# Patient Record
Sex: Female | Born: 1950 | Race: White | Hispanic: No | Marital: Married | Smoking: Never smoker
Health system: Southern US, Community
[De-identification: ages and names within clinical notes are randomized; demographics above are authoritative.]

## PROBLEM LIST (undated history)

## (undated) DIAGNOSIS — I2510 Atherosclerotic heart disease of native coronary artery without angina pectoris: Secondary | ICD-10-CM

## (undated) DIAGNOSIS — C801 Malignant (primary) neoplasm, unspecified: Secondary | ICD-10-CM

## (undated) DIAGNOSIS — I639 Cerebral infarction, unspecified: Secondary | ICD-10-CM

## (undated) HISTORY — PX: THROAT SURGERY: SHX803

## (undated) HISTORY — PX: APPENDECTOMY: SHX54

## (undated) HISTORY — PX: OTHER SURGICAL HISTORY: SHX169

## (undated) LAB — GLUCOSE POC NURSING: POC Glucose: 109

## (undated) MED FILL — ALECTINIB 150 MG CAPSULE: 150 150 mg | ORAL | 30 days supply | Qty: 180 | Fill #2

---

## 2011-12-03 NOTE — Unmapped (Signed)
 Formatting of this note might be different from the original.  Patient aware that she may follow colonoscopy instruction as per doctor's orders according to telephone instruction sheet.   Electronically signed by Lacretia Arrant at 12/03/2011  1:23 PM EDT

## 2011-12-03 NOTE — Unmapped (Signed)
 Formatting of this note might be different from the original.  Patient states that she is to have an EGD and colonoscopy; Dr. Lynda office was called for clarification and patient is to have EGD and colonoscopy; orders to follow per fax from Dr's office.   Patient informed this Clinical research associate that she has been on clear liquids all day and that was given prep instructions for colonoscopy from Dr. Lynda office which she is following.   Electronically signed by Lacretia Arrant at 12/03/2011  1:22 PM EDT

## 2011-12-03 NOTE — Unmapped (Signed)
 Formatting of this note might be different from the original.  Pre-surgical services pre-operative telephone instructions including; that someone needs to stay with patient for 24 hours after procedure, leave all jewelry (including wedding ring) valuables at home, remove ear/body piercing's  and she verbalizes understanding. Patient aware not to take her am medication the am prior to her procedure 12/04/11.    Spoke with Tillman (Endoscopy Production designer, theatre/television/film) and informed her that patient states that she has a mild allergy to latex: that after wearing her CPAP mask for 2 weeks she developed a rash on her face. Was informed by Tillman that Dr. Lynda cases are done latex free and no need for 1st patient in the room due.  Electronically signed by Lacretia Arrant at 12/03/2011 12:37 PM EDT

## 2011-12-03 NOTE — Unmapped (Signed)
 Formatting of this note might be different from the original.  Received orders from Dr. Lynda office for EGD and colonoscopy for 12/04/11.    Electronically signed by Lacretia Arrant at 12/03/2011  1:50 PM EDT

## 2011-12-04 NOTE — H&P (Signed)
 Formatting of this note is different from the original.  History & Physical Note    Patient: Joanna Lopez MRN: [id number]     Date of Birth: May 03, 1951  Age: 61 year old  Sex: female    Unit: [facility] ENDOSCOPY Room/Bed: BTENDO/PB Location: [place]     Admitting Physician: SHARMON CHARLESTON B.     Primary Care Physician: Patsie Later    Date of Surgery: 12/04/2011  Chief Complaint: Diarrhea [787.91]  Hemorrhage of rectum and anus [569.3]  Surgeon: SHARMON CHARLESTON NOVAK., MD   Allergy:   Allergies   Allergen Reactions   ? Latex Rash     States she has a sensitivity to latex after being around it for days, she gets a rash: she states that when she wore her cpap machine for 14 days she developed a rash on her face other than this she has no problems stating her dentist wears latex gloves and she has no problems with latex gloves.    ? Formaldehyde      in nail polish and rash and swelling   ? Penicillins      rash and ?wheezing as a child   ? Sulfa Antibiotics      rash     Subjective:     Joanna Lopez is a 60 year old female with Diarrhea [787.91]  Hemorrhage of rectum and anus [569.3] who presents for Procedure(s):    Past Medical History:  Positives:  Past Medical History   Diagnosis Date   ? Unspecified essential hypertension    ? Depressive disorder, not elsewhere classified    ? Hyperlipemia    ? Other malignant neoplasm without specification of site (HCC)      basal skin cancer     Negatives:  No past medical history pertinent negatives.    Past Surgical History:  Positives:  Past Surgical History   Procedure Laterality Date   ? Appendectomy  age 6   ? Mastoidectomy,simple  1979     left side-thought ca but turned out blocked duct   ? Colonoscopy  2004     nl/screening   ? Adenoidectomy     ? Uvulopalatopharyngoplasty  2006   ? Tonsillectomy       Negatives:   No past surgical history pertinent negatives on file.    History of adverse reaction to anesthesia: No    Family History    Problem Relation Age of Onset   ? Adopted: Yes     History   Substance Use Topics   ? Smoking status: Never Smoker    ? Smokeless tobacco: Never Used   ? Alcohol Use: Yes      a little wine     Prior to Admission medications    Medication Sig Start Date End Date Taking? Authorizing Provider   lisinopril  (PRINIVIL ,ZESTRIL ) 10 MG TABS Take 10 mg by mouth daily.   Yes Med, Historical   simvastatin (ZOCOR) 20 MG TABS Take 20 mg by mouth at bedtime.   Yes Med, Historical   CELEXA  20 MG OR TABS TAKE ONE TABLET DAILY  08/02/04  Yes Rosanna Charlie NOVAK., MD   PREMPRO 0.3-1.5 MG OR TABS 1 TABLET DAILY 10/19/03  Yes Rosanna Charlie NOVAK., MD   ACETASOL-HC 2-1 % OT SOLN AS NEEDED 09/24/03  Yes Rosanna Charlie NOVAK., MD   NASONEX 50 MCG/ACT NA SUSP TWO PUFFS EACH SIDE ONCE PER DAY 04/20/03  Yes Rosanna Charlie NOVAK., MD   CALTRATE 600 +  D TABS 600-125 MG-IU OR 2 TABLETS DAILY 01/11/02        Review of Systems    Immunizations:  Immunization History   Administered Date(s) Administered   ? dT, adult 01/11/2002     Objective:     BP 130/82  Pulse 73  Temp(Src) 97.6 F (36.4 C) (Temporal)  Resp 14  Ht 66.5 (168.9 cm)  Wt 215 lb (97.523 kg)  BMI 34.19 kg/m2  SpO2 98%  Weight: Weight: 215 lb (97.523 kg)  Height: Height: 66.5 (168.9 cm)  O2 Saturation: SpO2: 98 %    Physical Exam   Constitutional: She appears well-developed and well-nourished.   HENT:   Head: Normocephalic and atraumatic.   Eyes: Pupils are equal, round, and reactive to light.   Neck: Normal range of motion. Neck supple.   Cardiovascular: Normal rate and regular rhythm.    Pulmonary/Chest: Effort normal and breath sounds normal.   Abdominal: Soft. Bowel sounds are normal.   Musculoskeletal: Normal range of motion.   Neurological: She is alert.   Skin: Skin is warm.     Assessment/Plan:         Principal Problem:    Rectal bleeding  Active Problems:    Diarrhea    Procedure:  Procedure(s):  ESOPHAGOGASTRODUODENOSCOPY WITH POSSIBLE BIOPSY AND OR DILATION AND  COLONOSCOPY WITH POSSIBLE BIOPSY AND OR POLYPECTOMY     Additional recommendations: Per Surgeon    Signed By: Lamar FURY Cucinotta, MD      Electronically signed by Sharmon Lamar NOVAK., MD at 12/04/2011  8:37 AM EDT

## 2011-12-04 NOTE — OR Nursing (Signed)
 Formatting of this note might be different from the original.  No latex utilized in endo procedure room  Electronically signed by Delphina Lolita RAMAN, Registered Nurse at 12/04/2011  9:14 AM EDT

## 2011-12-04 NOTE — Nursing Note (Signed)
 Formatting of this note might be different from the original.  Pt c/o gas pain. Pt grimacing, moaning. Pt repositioned, pt passing flatus, abd soft, BS present. Pt offered to ambulated to restroom pt pt states she feels she is too woosy to get out of bed. Monitoring.  Electronically signed by Layman Leita PARAS, Registered Nurse at 12/04/2011  9:44 AM EDT

## 2011-12-04 NOTE — OR Nursing (Signed)
 Formatting of this note might be different from the original.  Post Endoscopy Patient Assessment    Dentition  Bite Block removed by Particia [place], CRNA  Dental condition unchanged from pre-procedure:  correct    Dilitation:    Belongings    Glasses to recovery:  yes    General Post Colon    Abdomen Soft:  yes      Electronically signed by Delphina Lolita RAMAN, Registered Nurse at 12/04/2011  9:35 AM EDT

## 2011-12-04 NOTE — Nursing Note (Signed)
 Formatting of this note might be different from the original.  Pt passed large amts flatus, pt reports feeling good. Returned to [employer], husband at bedside. Pt currently eating and drinking without c/o pain or n/v. Swallowing without difficulty. Dr. Cucinotta in to see pt.   Electronically signed by Layman Leita PARAS, Registered Nurse at 12/04/2011 10:26 AM EDT

## 2011-12-04 NOTE — Nursing Note (Signed)
 Formatting of this note might be different from the original.  Discharge teaching provided to pt and husband, understanding voiced of all instructions. Pt provided with Diverticular Disease and High Fiber Diet pamphlets. No further changes in assessment. Pt getting ready for discharge.  Electronically signed by Layman Leita PARAS, Registered Nurse at 12/04/2011 10:32 AM EDT

## 2011-12-04 NOTE — Nursing Note (Signed)
 Formatting of this note might be different from the original.  Assisted to bathroom per wheelchair. Pt currently remains in bathroom, passing flatus but still c/o bad gas pain. Monitoring.  Electronically signed by Layman Leita PARAS, Registered Nurse at 12/04/2011  9:59 AM EDT

## 2012-07-21 ENCOUNTER — Ambulatory Visit: Admit: 2012-07-21 | Discharge: 2012-07-21 | Payer: PRIVATE HEALTH INSURANCE | Attending: Critical Care Medicine

## 2012-07-21 DIAGNOSIS — G4710 Hypersomnia, unspecified: Secondary | ICD-10-CM

## 2012-07-21 MED ORDER — tiotropium (SPIRIVA WITH HANDIHALER) 18 mcg
18 | ORAL_CAPSULE | Freq: Every day | RESPIRATORY_TRACT | 3.00 refills | 30.00000 days | Status: AC
Start: 2012-07-21 — End: 2012-10-18

## 2012-07-21 NOTE — Unmapped (Signed)
History of Present Illness:      Joanna Lopez is a 62 y.o. female.    HPI  62 yo white female lifelong nonsmoker present for abnormal CT chest. She presented to pcp for cough which began with URI. Saw pcp and had CXR to exclude pneumonia. RML nodule seen on CXR and CT chest chest performed which confirmed nodule and borderline adenopathy. Cough is now nearly gone. Now 2 on 1-10 scale, 10 worst. Pt notes hypersomnolence and a hx of OSA s/p UPPP and taken off CPAP 3 years. Weight is stable. She has hot flashes since June 1999, no recent change. No chest pain until severe cough. No passive smoke exposure. She is adopted but none of her children have asthma. Never treated with inhalers.       The following portions of the patient's history were reviewed and updated as appropriate: allergies, current medications, past family history, past medical history, past social history, past surgical history and problem list. Widowed/ remarriwed, lives in house with husband and cat, works as Diplomatic Services operational officer at [employer]- no significant occupational exposures.    Review of Systems   Constitutional: Positive for fatigue. Negative for fever, chills, diaphoresis, activity change, appetite change and unexpected weight change.   HENT: Positive for congestion and neck pain. Negative for hearing loss, ear pain, nosebleeds, sore throat, facial swelling, rhinorrhea, sneezing, drooling, mouth sores, trouble swallowing, neck stiffness, dental problem, voice change, postnasal drip, sinus pressure, tinnitus and ear discharge.    Eyes: Negative for photophobia, pain, discharge, redness, itching and visual disturbance.   Respiratory: Positive for apnea and cough. Negative for choking, chest tightness, shortness of breath, wheezing and stridor.    Cardiovascular: Negative for chest pain, palpitations and leg swelling.   Gastrointestinal: Positive for diarrhea. Negative for nausea, vomiting, abdominal pain, constipation, blood in  stool, abdominal distention, anal bleeding and rectal pain.   Genitourinary: Positive for urgency and flank pain. Negative for dysuria, frequency, hematuria, decreased urine volume, vaginal bleeding, vaginal discharge, enuresis, difficulty urinating, genital sores, vaginal pain, menstrual problem, pelvic pain and dyspareunia.   Musculoskeletal: Negative for myalgias, back pain, joint swelling, arthralgias and gait problem.   Skin: Negative for color change, pallor, rash and wound.   Neurological: Positive for headaches. Negative for dizziness, tremors, seizures, syncope, facial asymmetry, speech difficulty, weakness, light-headedness and numbness.   Hematological: Negative for adenopathy. Does not bruise/bleed easily.   Psychiatric/Behavioral: Positive for sleep disturbance. Negative for suicidal ideas, hallucinations, behavioral problems, confusion, self-injury, dysphoric mood, decreased concentration and agitation. The patient is not nervous/anxious and is not hyperactive.    All other systems reviewed and are negative.        Objective:   Blood pressure 124/84, pulse 70, height 5' 6 (1.676 m), weight 212 lb 9.6 oz (96.435 kg), SpO2 97.00%.    Physical Exam   Nursing note and vitals reviewed.  Constitutional: She is oriented to person, place, and time. She appears well-developed and well-nourished. No distress.   HENT:   Head: Normocephalic and atraumatic.   Right Ear: External ear normal.   Left Ear: External ear normal.   Nose: Nose normal.   Mouth/Throat: Oropharynx is clear and moist. No oropharyngeal exudate.   TMs with good cone of light bilaterally, s/p UPPP   Eyes: Conjunctivae and EOM are normal. Right eye exhibits no discharge. Left eye exhibits no discharge. No scleral icterus.   Neck: Neck supple. No JVD present. No tracheal deviation present. No thyromegaly present.   Cardiovascular:  Normal rate, regular rhythm, normal heart sounds and intact distal pulses.  Exam reveals no gallop and no friction  rub.    No murmur heard.  Pulmonary/Chest: Effort normal and breath sounds normal. No stridor. No respiratory distress. She has no wheezes. She has no rales. She exhibits no tenderness.   Abdominal: Soft. She exhibits no distension and no mass. There is no tenderness. There is no rebound and no guarding.   Musculoskeletal: She exhibits no edema and no tenderness.   No clubbing   Lymphadenopathy:     She has no cervical adenopathy.   Neurological: She is alert and oriented to person, place, and time. No cranial nerve deficit. Coordination normal.   Skin: Skin is warm and dry. No rash noted. She is not diaphoretic. No erythema. No pallor.   Psychiatric: She has a normal mood and affect. Her behavior is normal. Judgment and thought content normal.       Lab Review:   CT chest 07/12/12:  Right middle lobe nodule. Neoplasm is not excluded and PET/CT correlation is recommended.  Borderline prominent right hilar lymph nodes are noted with 1.2 cm AP window nodes also noted.  Indeterminate hepatic lesions, likely atypical cysts.  Electronically Signed by: Fransico Him, MD     Result Narrative   CT OF THE CHEST WITH CONTRAST  History: 796.9  FINDINGS:  The lungs appear clear of acute parenchymal consolidation or effusion.  There is a nodular density seen in the right middle lobe which measures 1.7 cm. This is suspicious for possible neoplasm and correlation with PET/CT or followup imaging may be performed.  The port line prominent right hilar lymph nodes noted.  No gross mediastinal adenopathy is seen. However, a borderline prominent AP window node is noted which measures up to 1.2 cm.  Limited views of the upper abdomen show multiple hepatic lesions. These are indeterminate and likely represent cysts. However, atypical metastases are difficult to exclude entirely.         Prior Diagnostic Testing:         Assessment:     1. RML nodule/borderline  AP window LN- will schedule PET/CT to assess activity of disease-- if negative  will get follow up CT chest in 6 months. If positive, will need tissue diagnosis. No previous hx of CA except basal cell CA. She does not know her family hx as she is adopted.    2.GERD- increased with URI and may be cause of persistent cough. Now nearly gone.    3. Hypersomnolence/OSA/UPPP- will refer to sleep medicine for evaluation    4. Obesity- discussed weight loss/diet.          Plan:     Call for CT/PET results to plan next step.             Medical Decision Making:  The following items were considered in medical decision making:  Review / order clinical lab tests  Review / order radiology tests  Review / order other diagnostic tests/interventions  Reviewed outside records  Independent review of radiologic images

## 2012-07-21 NOTE — Unmapped (Signed)
Call for results of the PET/CT.

## 2012-07-27 ENCOUNTER — Inpatient Hospital Stay: Admit: 2012-07-27 | Payer: PRIVATE HEALTH INSURANCE | Attending: Critical Care Medicine

## 2012-07-27 DIAGNOSIS — R911 Solitary pulmonary nodule: Secondary | ICD-10-CM

## 2012-07-27 LAB — POC GLU MONITORING DEVICE: POC Glucose Monitoring Device: 85 mg/dL (ref 65–99)

## 2012-07-27 MED ORDER — F-18 FDG 19.8 milli Curie
Freq: Once | Status: AC | PRN
Start: 2012-07-27 — End: 2012-07-27

## 2012-07-27 MED ORDER — F-18 FDG 18.8 milli Curie
Freq: Once | Status: AC | PRN
Start: 2012-07-27 — End: 2012-07-27
  Administered 2012-07-27: 14:00:00 via INTRAVENOUS

## 2012-07-27 MED FILL — F-18 FDG INJECTION: 18.80 18.80 millicurie | Qty: 1

## 2012-07-27 MED FILL — F-18 FDG INJECTION: 19.80 19.80 millicurie | Qty: 1

## 2012-08-01 NOTE — Unmapped (Signed)
No answer.  Left message that I called.

## 2012-08-01 NOTE — Unmapped (Signed)
Results in EPIC

## 2012-08-01 NOTE — Unmapped (Signed)
patient and spouse called for results of PET CT done on 07/27/2012 at [facility].  patient and spouse can be reached at [phone] (home) [phone] (work)  It is not okay to leave detailed message at this phone number. Please call husband at [phone] ok per wife   Last visit:   Next Visit:

## 2012-08-02 NOTE — Unmapped (Signed)
Called pt/ Will schedule abdominal ultrasound to further characterize liver and left kidney lesion. Will schedule follow up CT chest in 6 months with follow up with me thereafter.

## 2012-08-02 NOTE — Unmapped (Signed)
Patient returning call, can contact her at work with the results [phone].

## 2012-08-02 NOTE — Unmapped (Signed)
Pt has been called and scheduled on 3/28 for abd ct / Sept 23 - chest ct amd Dr. Eleanora Neighbor on 9/25 [facility]

## 2012-08-02 NOTE — Unmapped (Signed)
I called pt.

## 2012-08-05 ENCOUNTER — Inpatient Hospital Stay: Admit: 2012-08-05 | Payer: PRIVATE HEALTH INSURANCE | Attending: Critical Care Medicine

## 2012-08-05 DIAGNOSIS — K769 Liver disease, unspecified: Secondary | ICD-10-CM

## 2012-08-09 ENCOUNTER — Inpatient Hospital Stay: Payer: PRIVATE HEALTH INSURANCE | Attending: Critical Care Medicine

## 2012-08-11 NOTE — Unmapped (Signed)
Will arrange for her to have GI consult for frequent stools and Urology consult for bilateral hydronephrosis at [facility]. She knows you will call. Thanks

## 2012-08-11 NOTE — Unmapped (Signed)
Patient called requesting to speak with MD about her results for her US Abdomen Complete. She has a few more questions. Please contact patient.

## 2012-08-12 NOTE — Unmapped (Signed)
No answer at 10:07am

## 2012-08-15 NOTE — Unmapped (Signed)
Spoke with patient. Numbers given to schedule appt.

## 2012-08-19 ENCOUNTER — Inpatient Hospital Stay: Admit: 2012-08-19

## 2012-08-29 ENCOUNTER — Ambulatory Visit: Admit: 2012-08-29 | Payer: PRIVATE HEALTH INSURANCE

## 2012-08-29 DIAGNOSIS — G4733 Obstructive sleep apnea (adult) (pediatric): Secondary | ICD-10-CM

## 2012-08-29 NOTE — Unmapped (Signed)
~~~~~~~~~~~~~~~~~~~~~~~~~~~~~~~~~~~~~~~~~~~~~~~~~~~~~~~~~~~    CHIEF COMPLAINT: previously diagnosed with OSA was given PAP Therapy, used it for a while then had UPPP. Never was restested after UPPP. Now complains of hyeprsomnia, fatigue, frequent nighttime awakenings, leg movts at night, legs restless, memory problems, dry mouth, nasal congestion, waking up unrefreshed, gasp arousals, nocturia, night sweats, witnessed apnea, vivid dreams, muscle jerk in sleep    REFERRING PHYSICIAN: Dr Doylene Canard (ENT); Dr Eleanora Neighbor (pulm)    PCP & Other consulting physicians:     HISTORY  HPI:  62 y.o. with history of hyperlipidemia, hypertension, anxiety/depression, GERD presents with previously diagnosed with OSA was given PAP Therapy, used it for a while then had UPPP. Never was restested after UPPP. Now complains of hyeprsomnia, fatigue, frequent nighttime awakenings, leg movts at night, legs restless, memory problems, dry mouth, nasal congestion, waking up unrefreshed, gasp arousals, nocturia, night sweats, witnessed apnea, vivid dreams, muscle jerk in sleep referred for evaluation of symptoms suggesting a sleep related breathing disorder.     Patient was never retested after herUPPP but notes her snoring never went away after surgery.     She also endorses URGE symptoms but also describes radiating pain from low back down to her feet. Informed could have some radiculopathy that needs to be evaluated by PCP.     EPWORTH SLEEPINESS SCALE: 17 (at or above 10 is abnormal and 24 is the maximum score),   International Restless Legs Syndrome Rating Scale: 28 (Mild 0-10; Moderate 11-20; Severe 21-30; Very severe 31-40)       Sleep History and Schedule  Bedtime is 8-9 on weekdays and 10 on weekends. Falls asleep within 3 minutes, with 3 nighttime awakenings. Falls back asleep in 3 minutes. Sleeps on own bed. Awakens at 6 during the week and 10-11 on the weekends.   Husband bed partner. Patient thinks they get approximately 9-11 hours of sleep  a night.      Patient's desired sleep schedule would be to fall asleep at 9 and wake up at 10    Denies cataplexy, sleep paralysis, hypnagogic or hypnopompic hallucinations, dream enactment, and somnambulism.    Denies: bruxism, enuresis, sleepwalking, sleep eating, sleep terrors,   Endorse; all URGE symptoms such as (irresistible urge to move legs, sensation being worse at late evening or when resting, movement improving symptoms) or muscle jerks in sleep.     Sleep Habits   Does NOT drink caffeine 6 hours before bedtime   Does NOT exercise before bedtime   Does NOT reads before bedtime on a e-reader   Watches TV before falling asleep, TV is in the bedroom  Does NOT work on electronic devices before falling asleep or during the night  Patient does takes naps 3-4 days a week for 3-5 hours  Does not use sleeping aid.  Driving while drowsy: No veering to the side of the road,accidents, or near-accidents due to sleepiness while driving.     Social Hx:   Marital Status: married with 3 children  Occupation: Air cabin crew  Tobacco- never  Caffeine - yes 1 cup by 4pm   Alcohol - 1 glass ofwine weekly  Recreational Drugs - no  Exercise - no    Current Medication List:   Outpatient Prescriptions Marked as Taking for the 08/29/12 encounter (Office Visit) with Ozzie Hoyle, PA   Medication Sig Dispense Refill   ??? calcium carbonate (OS-CAL) 600 mg (1,500 mg) Tab Take 600 mg by mouth 2 times a day with meals.       ???  cholecalciferol, vitamin D3, 1000 units tablet Take 1,000 Units by mouth daily.       ??? citalopram (CELEXA) 20 MG tablet Take 20 mg by mouth daily.       ??? dicyclomine (BENTYL) 10 MG capsule Take 10 mg by mouth 4 times a day with meals and at bedtime.       ??? esomeprazole (NEXIUM) 40 MG capsule Take 40 mg by mouth every morning before breakfast.       ??? lactobacillus rhamnosus, GG, (CULTURELLE) 10 billion cell capsule Take 2 capsules by mouth daily.       ??? lisinopril (PRINIVIL,ZESTRIL) 10 MG  tablet Take 10 mg by mouth daily.       ??? simvastatin (ZOCOR) 40 MG tablet Take 40 mg by mouth at bedtime.         No Facility-Administered Medications for the 08/29/12 encounter (Office Visit) with Ozzie Hoyle, PA.       ROS: Endorses: hx of witnessed apneas, daytime sleepiness, (somnloence), fatigue, tiredness, morning headaches, dizziness, palpitations,  dry mouth, nasal congestion,  gasp arousals, waking up unrefreshed,  nocturia,     Denies: suicidal ideas, irritability, depression, racing mind, changes in appetite, fainting spells, alcoholism, tremors, using illegal drugs, worrying about inability to sleep, insomnia, chest pain and shortness of breath, sexual problems, memory problems, concentration difficulties,sleeps in a chair with chest raised, sleeps with a dental guard, drowsy driving,lower extremity edema,    Family Hx:   Family History   Problem Relation Age of Onset   ??? Adopted: Yes   ??? Diabetes Son        Past Medical Hx:   Past Medical History   Diagnosis Date   ??? GERD (gastroesophageal reflux disease)    ??? Hypertension    ??? Hyperlipidemia    ??? IBS (irritable bowel syndrome)    ??? Basal cell cancer    ??? OSA (obstructive sleep apnea)        Problem List:  Patient Active Problem List   Diagnosis   ??? GERD (gastroesophageal reflux disease)   ??? Lung nodule, solitary       SurgHx:   Past Surgical History   Procedure Laterality Date   ??? Appendectomy     ??? Uvp     ??? Submastoid gland removal     ??? Uvulopalatopharyngoplasty     ??? Tonsillectomy         See scanned Sleep History/Life History Questionnaire for ROS and additional information. This has been reviewed and updated with the patient during this visit.     OBJECTIVE:     Physical:    Vital signs and nursing intake notes reviewed.   BP 128/82   Pulse 97   Resp 18   Ht 5' 6.5 (1.689 m)   Wt 211 lb 9.6 oz (95.981 kg)   BMI 33.65 kg/m2   SpO2 97%   Constitutional: well groomed, no respiratory distress, not using expiratory accessory muscles, speaking  in full sentences, Weight: obese class I   Skin: normal color  Eyes: pupils equal.   Ears/Nose/Throat: external nose normal, no deviated septum, no thrush, normal dentition, modified Mallampati Class 4, uvula surgically absent, macroglossia, tongue ridging.   Neck: no thyroid masses or nodules, no thyromegaly   Chest: no deformities, no chest wall tenderness, normal chest expansion/symmetry, normal resonance, no wheezing, no crackles, no rhonchi.   Cardiovascular: RRR, clear s1/s2, m/r/g, no peripheral edema,   Extremities: no clubbing, no cyanosis, normal gait, no  peripheral edema.   Neurological: cranial nerves grossly intact, appropriate mental status.   Psychological: oriented to time; place; and person, affect and mood appropriate, normal interaction.     Labs Reviewed:   No results found for this basename: IRON, TIBC, FERRITIN     No results found for this basename: WBC, HGB, HCT, MCV, PLT     No results found for this basename: GLUCOSE, BUN, CO2, CREATININE, K, NA, CL, CALCIUM     No results found for this basename: HGBA1C     No results found for this basename: TSH, T3TOTAL, T4TOTAL, T3FREE, FREET4, THYROIDAB       Prior Diagnostic Testing: No prior sleep study available for reveiw    ASSESSMENT/PLAN:     1) Obstructive sleep apnea: 63 y.o. obese patient with history of  hyperlipidemia, hypertension, anxiety/depression, GERD presents with previously diagnosed with OSA was given PAP Therapy, used it for a while then had UPPP. Never was restested after UPPP. Now complains of hyeprsomnia, fatigue, frequent nighttime awakenings, leg movts at night, legs restless, memory problems, dry mouth, nasal congestion, waking up unrefreshed, gasp arousals, nocturia, night sweats, witnessed apnea, vivid dreams, muscle jerk in sleep. Given the high clinical suspicion of sleep disordered breathing, a nocturnal polysomnography is indicated. The patient was instructed in the procedure.      The patient was educated re: OSAHS  pathophysiology, relationship with cardiovascular, cerebrovascular and metabolic morbidity and mortality, issues related treatment options. Various treatment modalities such as positive airway pressure therapy (PAP Therapy), mandibular repositioning device (MRA) . Patient verbalized understanding.    Patient was advised that if NPSG study indicates a diagnosis of obstructive sleep apnea to return to clinic for PAP titration study. If PAP therapy is pursued, it is recommended patient return for a CPAP Edu clinic visit 2-4 wks after obtaining equipment. Return for provider office visit in 10-12 wks. Patient verbalized understanding and agrees to comply.     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 Reassess after sleep study(ies). Reviewed sleep hygiene. Patient was educated regarding avoiding driving while sleepy, specifically avoiding long distance or night driving pending successful treatment of this condition.     3) Restless leg syndrome: Patient endorses all URGE symptoms such as irresistible urge to move legs, sensation being worse at late evening or when resting, movement improving symptoms. Occurs 5-7 nights per week. Patient denies a history of IDA. These symptoms have no association with onset on starting new medications.  Patient was adopted. Endorses leg jerks while sleeping . Laboratory workup for causes of secondary RLS will be performed such as Iron, TIBC and ferritin. Evaluation for the possibly associated PLMS will be accomplished at the time of the upcoming polysomnographic testing.     Diet, exercise and proper sleep hygiene were emphasized. Sleep hygiene instructions given.

## 2012-08-29 NOTE — Unmapped (Signed)
Instructions for your Sleep Study           LOCATION AND DIRECTIONS    Your study will be conducted at our [facility] on [address] [place], [place] [address] (Please note this is a different location from where you had your clinic appointment).     Driving Directions: Once you turn onto [address] go straight past the first stop sign, at the second stop sign, turn right. Once you turn, you will see a ???[facility]??? sign. You can park in the lot in front of it. The door under the awning is the Sleep [facility] Center. Go through the sliding doors, there is doorbell on the wall, please press the button and a technician will let you in.     Overnight studies begin at 8pm (please do not arrive earlier). The wake up time for the study is between 5-6 am. If you need to wake up sooner, please notify the technician before going to sleep.       If someone will be picking you up in the morning following your study, you must inform the sleep technician the night before, so they can allow you time to call for your ride.     WHAT TO BRING    You should bring your toiletries, sleepwear, a robe or sweater (in case you get cold), and a change of clothing for when you leave. Wear your regular clothing when you arrive. Please BRING your night-time clothes with you. Please note leads will be placed on your scalp and chest. Please wear something loose and comfortable.  You will be given time to change.     Medications: Please bring a complete list of all your medications (including vitamins, supplements or medications you take as needed). BRING your nighttime medications with you, they have to be in their original packaging (you will be given an opportunity to take your nighttime medications before you sleep). If you normally use sleep aids or medications for restless legs or periodic limb movement disorder such as Ambien, Lunesta, Melatonin, Mirapex, Requip etc please bring them with you. Notify the  technician of all medications you take that night.     BRING your insurance card and picture ID.     Optional: Bring your own pillow and/or blanket, snacks and sweater.     If you normally wear a dental guard at night please bring it with you for the study. Ideally we would like to study you without the guard in place to establish what your normal sleep pattern is.      If you have a PAP device please bring your equipment and supplies to the study. If any pressure changes are needed a preliminary adjustment may be made.    WHAT TO DO BEFORE YOUR SLEEP TEST (Please follow the same instructions for a home sleep test)      Shower before you come for the test (this way there are no oils on the skin for the study). DO NOT apply lotion, creams, make-up, nail polish or put hair products (such as hair spray or gel). Your hair should be dry when you arrive. Each room has its own bathroom and shower. You will be able to shower the following morning if you wish. Be sure to bring your own toiletries as these are NOT provided.     Female Patients: Unless you have a beard, please be ???clean shaven???    DO NOT take any naps on the   day of the study.     DO NOT have any chocolate or caffeine after 2 pm.     Please inform the technician if you have any allergies or sensitivities, before the technician starts to apply the leads (stickers).      If you need to cancel or re-schedule, you must give [place] at least 24 HOURS notice or you may be charged a cancellation fee. Please call our office at [phone], option #1 or our sleep lab at [phone] (leave a voicemail) to inform us of changes.     If you have a terrible cold or received some bad news on the day of the study, please contact our office to reschedule. Please follow all protocols regarding cancellation and no show policies. If you have any questions or concerns please call our office for further details, call [phone] Option #1.      AFTER YOUR SLEEP STUDY  ?? After your first  study (a diagnostic study), you will receive a call within 7-10 business days with your results.   ?? If your study indicates that you have obstructive sleep apnea (OSA) you will be advised to set up a second sleep study. The purpose of the second study is to determine the setting for your PAP equipment (positive airway pressure device). On this night you will be shown a variety of masks. You will choose the one most comfortable to you. The technician will start at the lowest pressure and keep increasing it by one level until an optimal pressure is reached (which is when your oxygen level is normal and you are not snoring anymore).   ?? Within 7-10 business days a final prescription will be sent to a DME (medical supply company) covered by your insurance. The DME company should contact you within 7-14 business days. If you have not received a call from the DME Company regarding your device, PLEASE CALL us so we can further investigate.   ?? If your study indicates you do not have obstructive sleep apnea, please schedule a follow up appointment with your provider to review other possible sleep disorders that may be identified during your sleep test.   ?? If you have a home sleep test, you will receive a call after 5-7 business days with your result. You will be informed as to what the next step will be. You should receive a call from the DME company within 7-14 days after your first study. If you do not hear from them PLEASE CALL us.   ?? Once you are set up with your PAP equipment, please call our office and set up an appointment for our CPAP Education Clinic 2-3 weeks later. We will obtain a download from your machine, address any issues with the mask or device, review cleaning instructions, replacement supplies and answer any questions you have.   ?? Please bring all your equipment to EVERY office visit including your machine, tubing, mask and power cord. Do not travel with water in the water chamber. We do a thorough  check on your device at every visit.   ?? Please note that if you are provided with a mask during your overnight sleep study or by your medical supply company you have 30 days to make any changes or exchanges. After the 30 day period in most cases no changes can be made for up to 3 months or longer depending on your insurance. If you have any questions please contact your medical supply company or follow up with  [place] in CPAP education clinic to learn more.       CPAP EDUCATION CLINIC    Most insurance companies require that you use your positive airway pressure device (CPAP, BPAP, APAP or ASV) a minimum of 4 hours per night in order to continue to pay for it.  Usually, you only have 90 days from the time you get it to show that you are using it regularly.  Because sleep apnea is a serious respiratory disorder that can cause heart diseases, stroke and other complications, the use of positive pressure whenever sleeping is the preferred goal of treatment.     While it is not required, it is strongly advised that you attend the CPAP Technician/Education Clinic for a visit with a sleep technician 2-4 weeks after being set up with your equipment by the home medical equipment company.  You will need a separate, face-to-face appointment with your sleep medicine doctor 31-90 days after getting your positive pressure device due to most insurance company rules, but most importantly, for discussion of your test results, your progress, additional assistance with problems you may be having and to discuss the future treatment and management of your sleep apnea.     The CPAP Technician/Education Clinic is a service we provide to assist our patients with learning to use their CPAP, BPAP, APAP or ASV. It is provided several times a week and it is a daytime appointment. At that visit you can discuss any issues you may be having with your PAP device. We will obtain a download from your device to see how well the machine is working for you  at home, the download will let us know when you put on the device, how long you've had it on, whether there are any leaks, and with some devices, an estimate of the amount of sleep apnea occurring at the prescribed pressure.     This is your tech support if you are having any issues with the mask such as discomfort or leaks, nose or mouth dryness (we can adjust the humidity setting and show you how to do it yourself), difficulty tolerating the pressure, etc.     It is our goal to address any issues you have with the device and mask early on so that you are comfortable with PAP therapy. If you have more questions regarding CPAP Technician Clinic please feel free to contact us for more details.     Once you receive your device and supplies, please make sure to bring all pieces to every office visit such as: device, mask, power cord, tubing, water reservoir, SD card/downloadable chip, etc.     Please note that if you are provided with a mask during your overnight sleep study or by your medical supply company you have 30 days to make any changes or exchanges. After the 30 day period in most cases no changes can be made for up to 3 months or longer depending on your insurance. If you have any questions please contact the medical equipment company or follow up with [place] in CPAP Technician Clinic to learn more.        What is Restless Leg Syndrome?     Restless Leg Syndrome (RLS) is a movement disorder. It involves an intense urge to move your legs at bedtime. RLS symptoms vary from person to person. These symptoms are at times hard to describe. Many people say their legs feel like there is a creepy or crawly sensation in them. This is different  from a muscle cramp or pain or numbness in one legs. The uncomfortable sensations of RLS appear most often in the calves of the legs. They are usually worse when sitting or lying still. One can find temporary relief by stretching or moving the legs. Some people also have symptoms  in the arms and other parts of the body.     Some people with RLS have symptoms only at certain times. Others have them on a regular basis. Nightly symptoms can create a constant need to stretch or move the legs. This may prevent you from falling asleep or staying asleep. As a result, people with RLS often have poor sleep quality. They may be tired during the day. Sleepiness is only one daytime problem that RLS causes, these symptoms also can make it hard to travel by car or plane during the day or stay seated at movies, concerts and business meetings. This is because it hard to sit still for long periods of time. \\    Most people with RLS also have periodic limb movements (PLMs). These movements tend to consist of an extension of the big toe, bending of the ankle, keen or hip and are described as jerks or kicks. PLMs occur most often when you are asleep. You are unaware of them and have no control over them. On rare occasions you may notice the PLMs while you are still awake. In contrast, RLS may cause movements when you are awake. They are voluntary response to uncomfortable feelings in your legs.     What causes RLS?     RLS may be hard to describe, but it is not a psychological or emotional condition. Researchers are unsure of its exact causes. Current studies are focused on a brain chemical called dopamine. Medications that increase dopamine in the brain have been effective at relieving symptoms associated with RLS.     Some people of medical conditions that seem to increase the chance of developing RLS. These conditions include :   Low blood iron levels  Poor blood circulation in the legs  Nerve problems in the spine or legs  Muscle disorders  Kidney disorders  Alcoholism  Certain vitamin or mineral deficiencies     About 50% of people with RLS who don't have one of the medical conditions listed above have a family member with RLS. This strongly suggests a hereditary cause for this disorder in some people.      No matter what the cause, some medications may trigger RLS. These include over-the-counter allergy and cold medications. Caffeine, alcohol, and tobacco use may make the condition worse.   How is RLS diagnosed?     There is currently no blood tst or x-ray that detects RLS. Your doctor will base a diagnosis on a complete medical history and physical exam. Additional tests may help determine if your complaints are related to another medical condition. These test might include blood work or an overnight sleep study. Most of the time RLS symptoms are unique that the diagnosis will be obvious. Having all these symptoms clearly indicates that you have RLS:   ?? You have an intense urge to move your legs  ?? You have unpleasant sensations in your legs that you might describe with these words: creeping, crawling, pulling, tingling, or electric feelings.   ?? Symptoms are worse when resting or inactive, especially when sitting or lying down  ?? Moving your legs relieves the symptoms  ?? Symptoms are worse in the evening or at  bedtime.       How is RLS treated?   The first step in treating RLS is to see if you have any other conditions that are related to the problem. Other conditions that might be related to RLS include the following: iron deficiency anemia, diabetes, arthritis, other medications.     Detecting and treating these conditions may sometimes relieve the symptoms of RLS. For many people the symptoms still persists and other forms of treatment are added on. Home remedies are enough to help some people with mild or occasional RLS. These remedies include:   ?? Hot baths  ?? Leg massage  ?? Applied heat  ?? Ice packs  ?? Aspirin or other pain relievers  ?? Quit smoking  ?? Not drinking alcohol  ?? Regular exercise  ?? The elimination of caffeine     When symptoms are severe or home remedies are ineffective, you can take prescription medications to treat RLS. Some people respond better to some medications versus others. Some  medications used to treat RLS are Sinemet, Mirapex, Requip, Klonopin, Neurontin.     Adapted from The [employer] wellness booklet on RLS.

## 2012-08-29 NOTE — Unmapped (Signed)
Allied  Adriane  Ded 650.00 (met)  20%  OOP 2650.00 (not met)  Precert is not needed  Ref # [id number]

## 2012-09-09 ENCOUNTER — Ambulatory Visit: Admit: 2012-09-09 | Payer: PRIVATE HEALTH INSURANCE

## 2012-09-09 DIAGNOSIS — G4733 Obstructive sleep apnea (adult) (pediatric): Secondary | ICD-10-CM

## 2012-09-10 NOTE — Unmapped (Signed)
NPSG completed.  M. Naksh Radi, RRT, RPSGT, RST

## 2012-09-20 NOTE — Unmapped (Signed)
Study was scored and copied to Dr. Surdulescu.

## 2012-09-22 NOTE — Unmapped (Addendum)
Message copied by Doren Custard on Thu Sep 22, 2012 10:13 AM  ------       Message from: Marcelle Overlie       Created: Wed Sep 21, 2012  1:41 PM       Regarding: Recommend f/u appt         NPSG was technically poor did not capture R sleep supine              Did not indicate OSA s/p UPPP mild UARS was noted.               Patient can follow up to discuss results and other options to address symptoms. Possibly repeat study etc.   ------    Called l/m on mobile number (home number not working) to have pt return call for results. See foll up recommendations above.  Results routed/faxed to Dr. Lawernce Ion

## 2012-09-27 NOTE — Unmapped (Signed)
I have an opening tomorrow and on Thursday next week

## 2012-09-28 NOTE — Unmapped (Signed)
L/M requesting return call to R/S OV to 10-06-12 (OK per PA Smith Robert), if pt. agrees.

## 2012-10-18 ENCOUNTER — Ambulatory Visit: Admit: 2012-10-18 | Discharge: 2012-10-18 | Payer: PRIVATE HEALTH INSURANCE

## 2012-10-18 DIAGNOSIS — R197 Diarrhea, unspecified: Secondary | ICD-10-CM

## 2012-10-18 MED ORDER — dicyclomine (BENTYL) 20 mg tablet
20 | ORAL_TABLET | Freq: Two times a day (BID) | ORAL | Status: AC
Start: 2012-10-18 — End: 2013-06-28

## 2012-10-18 NOTE — Unmapped (Signed)
62y/o woman with chronic diarrhea x about 10years.  Had EGD+ colonoscopy 6months ago at [facility] (she is not aware that any colon biopsies were done) and was prescribed fiber and bentyl.  Had CT abdomen in past 3months.  Actually improved last few days and only going 3-5days last few days.  More typically goes 6x/day, with great urgency. Very rare for her to have a formed BM.   Describes foul odor to BMs.   Also mentions that she needs to avoid onions and cabbage.  Symptoms got worse after trip to [place] 31yrs ago.  No abdominal pains.   Still has GB.  Denies rash or weight loss. No rectal bleeding, jaundice, history of alcoholism or pancreatitis.  She had recent PET/CT scan and I reviewed the noncontrast CT images of her abdomen and pelvis which are nondiagnostic.    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  Adopted. Son has celiac disease.  PMH: HTN, HL, OSA, depression.  PSH: UPPP, appy.    ROS form completed by patient and reviewed in office.    PE:  Nad; vss; afeb  Anicteric  No rash  Neck supple without goiter or LAD  Lungs cta  ht reg  abd- soft ND NT with normal BS  extre- no c/c/e  MS- appropriate    Assessment:  Chronic diarrhea.  Somewhat better on bentyl 10 BID.  Will increase bentyl to 20mg  PO BID.  Do want to see outside records from outside gastroenterologist- endo reports, ?path, ?labs.  Might need testing for CD, BO, EPI, etc.  RTO 4 weeks.

## 2012-11-15 ENCOUNTER — Ambulatory Visit: Admit: 2012-11-15 | Discharge: 2012-11-15 | Payer: PRIVATE HEALTH INSURANCE

## 2012-11-15 DIAGNOSIS — R197 Diarrhea, unspecified: Secondary | ICD-10-CM

## 2012-11-15 NOTE — Unmapped (Signed)
62y/o woman with chronic diarrhea x about 10years.   Had EGD, SBBx's + colonoscopy 6months ago at [facility] (she is not aware that any colon biopsies were done) and was prescribed fiber and bentyl.   Had CT abdomen in past 3months.   More typically goes 6x/day, with great urgency. Very rare for her to have a formed BM.   After recently increasing bentyl to 20mg  po bid, she does have some formed BMs, but still quite frequent.  Describes foul odor to BMs.   Also mentions that she needs to avoid onions and cabbage.   Symptoms got worse after trip to [place] 45yrs ago.   No abdominal pains. No weight loss.  Still has GB.   Denies rash or weight loss. No rectal bleeding, jaundice, history of alcoholism or pancreatitis.   She had recent PET/CT scan and I reviewed the noncontrast CT images of her abdomen and pelvis which are nondiagnostic.   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   Adopted. Son has celiac disease.   PMH: HTN, HL, OSA, depression.   PSH: UPPP, appy.     ROS form completed by patient and reviewed in office.   PE:  Nad; vss; afeb   Anicteric   No rash   nonacute exam  abd- soft ND NT with normal BS   extre- no c/c/e   MS- appropriate     Assessment:   Chronic diarrhea.   Somewhat better energy and consistency on bentyl 10 BID.   Advised dairy-free trial for one week.  If that doesn't work, then would stop all meds, except lisinopril and bentyl for one week.  If neither trial works, then would biopsy colon- with repeat colonoscopy.  RTO 4 weeks.

## 2012-11-17 ENCOUNTER — Ambulatory Visit: Payer: PRIVATE HEALTH INSURANCE

## 2012-12-29 ENCOUNTER — Ambulatory Visit: Admit: 2012-12-29 | Payer: PRIVATE HEALTH INSURANCE

## 2012-12-29 DIAGNOSIS — R197 Diarrhea, unspecified: Secondary | ICD-10-CM

## 2012-12-29 NOTE — Unmapped (Signed)
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   Had EGD, SBBx's + colonoscopy 6months ago at [facility] (she is not aware that any colon biopsies were done) and was prescribed fiber and bentyl. OSH noncontrast CT scan was normal.  Continues to have about 7x/day, (range 3-10) with great urgency. Very rare for her to have a formed BM.   After recently increasing bentyl to 20mg  po bid, she does have some formed BMs, but still quite frequent.   Describes foul odor to BMs.   Also mentions that she needs to avoid onions and cabbage.   Symptoms got worse after trip to [place] 14yrs ago.   No abdominal pains. No weight loss. Still has GB.     PMH: HTN, HL, OSA, depression.   PSH: UPPP, appy.     After last visit she tried dairy free diet for about a week, which didn't help, and she tried stopping meds (all but celexa and lisinopril) without effect. Also had no response to probiotics.     ROS form completed by patient and reviewed in office.   PE:  Nad; vss; afeb   Anicteric   No rash  nonacute exam  MS- appropriate     Assessment:   Chronic diarrhea.   Negative workup so far.  Has not had colonoscopic biopsies: will schedule.  If bx's (-), then would start stronger anti-diarrheal meds.

## 2012-12-29 NOTE — Unmapped (Signed)
62y/o school secretary with chronic diarrhea x about 10years.   Had EGD, SBBx's + colonoscopy 6months ago at [facility] (she is not aware that any colon biopsies were done) and was prescribed fiber and bentyl. OSH noncontrast CT scan was normal.  Continues to have about 7x/day, (range 3-10) with great urgency. Very rare for her to have a formed BM.   After recently increasing bentyl to 20mg po bid, she does have some formed BMs, but still quite frequent.   Describes foul odor to BMs.   Also mentions that she needs to avoid onions and cabbage.   Symptoms got worse after trip to [place] 4yrs ago.   No abdominal pains. No weight loss. Still has GB.     PMH: HTN, HL, OSA, depression.   PSH: UPPP, appy.     After last visit she tried dairy free diet for about a week, which didn't help, and she tried stopping meds (all but celexa and lisinopril) without effect. Also had no response to probiotics.     ROS form completed by patient and reviewed in office.   PE:  Nad; vss; afeb   Anicteric   No rash  nonacute exam  MS- appropriate     Assessment:   Chronic diarrhea.   Negative workup so far.  Has not had colonoscopic biopsies: will schedule.  If bx's (-), then would start stronger anti-diarrheal meds.

## 2013-01-11 NOTE — Unmapped (Signed)
Patient called and r/s her colo from 9-19 to 9-15

## 2013-01-23 MED ORDER — sodium chloride 0.9 % infusion
INTRAVENOUS | Status: AC
Start: 2013-01-23 — End: 2013-01-23
  Administered 2013-01-23: 16:00:00 via INTRAVENOUS

## 2013-01-23 MED ORDER — propofol 10 mg/ml (DIPRIVAN) INFUSION 200 mg
INTRAVENOUS | Status: AC | PRN
Start: 2013-01-23 — End: 2013-01-23
  Administered 2013-01-23: 17:00:00 via INTRAVENOUS

## 2013-01-23 MED ORDER — simethicone (MYLICON) 40 mg/0.6 mL drops
40 | ORAL | Status: AC | PRN
Start: 2013-01-23 — End: 2013-01-23
  Administered 2013-01-23: 17:00:00

## 2013-01-23 MED ORDER — propofol 10 mg/ml (DIPRIVAN) injection
10 | INTRAVENOUS | Status: AC | PRN
Start: 2013-01-23 — End: 2013-01-23
  Administered 2013-01-23 (×2): via INTRAVENOUS

## 2013-01-23 MED FILL — SODIUM CHLORIDE 0.9 % INTRAVENOUS SOLUTION: 50.00 50.00 mL/hr | INTRAVENOUS | Qty: 1000

## 2013-01-23 NOTE — Unmapped (Signed)
Anesthesia Transfer of Care Note    Patient: Joanna Lopez  Procedure(s) Performed: Procedure(s):  COLONOSCOPY WITH MAC    Patient location: Same Day Surgery    Post pain: Adequate analgesia    Post assessment: no apparent anesthetic complications, tolerated procedure well and no evidence of recall    Post vital signs:    Filed Vitals:    01/23/13 1137   BP: 136/81   Pulse: 70   Temp:    Resp: 13   SpO2: 94%       Level of consciousness: awake, alert  and oriented    Complications: None

## 2013-01-23 NOTE — Unmapped (Signed)
Colonoscopy  Care After  Read the instructions outlined below and refer to this sheet in the next few weeks. These discharge instructions provide you with general information on caring for yourself after you leave the hospital. Your doctor may also give you specific instructions. While your treatment has been planned according to the most current medical practices available, unavoidable complications occasionally occur. If you have any problems or questions after discharge, call your doctor.  ACTIVITY  ?? You may resume your regular activity, but move at a slower pace for the next 24 hours.   ?? Take frequent rest periods for the next 24 hours.   ?? Walking will help get rid of the air and reduce the bloated feeling in your belly (abdomen).   ?? No driving for 24 hours (because of the medicine (anesthesia) used during the test).   ?? You may shower.   ?? Do not sign any important legal documents or operate any machinery for 24 hours (because of the anesthesia used during the test).   NUTRITION  ?? Drink plenty of fluids.   ?? You may resume your normal diet as instructed by your doctor.   ?? Begin with a light meal and progress to your normal diet. Heavy or fried foods are harder to digest and may make you feel sick to your stomach (nauseated).   ?? Avoid alcoholic beverages for 24 hours or as instructed.   MEDICATIONS  ?? You may resume your normal medications unless your doctor tells you otherwise.   WHAT YOU CAN EXPECT TODAY  ?? Some feelings of bloating in the abdomen.   ?? Passage of more gas than usual.   ?? Spotting of blood in your stool or on the toilet paper.   FINDING OUT THE RESULTS OF YOUR TEST  Not all test results are available during your visit. If your test results are not back during the visit, make an appointment with your caregiver to find out the results. Do not assume everything is normal if you have not heard from your caregiver or the medical facility. It is important for you to follow up on all of your  test results.   SEEK IMMEDIATE MEDICAL ATTENTION IF:  ?? You have more than a spotting of blood in your stool.   ?? Your belly is swollen (abdominal distention).   ?? You are nauseated or vomiting.   ?? You have a temperature over 101.   ?? You have abdominal pain or discomfort that is severe or gets worse throughout the day.   Document Released: 12/10/2003 Document Re-Released: 10/15/2009

## 2013-01-23 NOTE — Unmapped (Signed)
[place]                              [facility]     PATIENT NAME:   Joanna Lopez, Joanna Lopez       MRN: [id number]  DATE OF BIRTH:  02-18-1951                     CSN: [id number]  PHYSICIAN:      Lurene Shadow, M.D.       ADMIT DATE: 01/23/2013  SERVICE:        Gastroenterology  DICTATED BY:    Lurene Shadow, M.D.       PROCEDURE DATE: 01/23/2013                                    ENDOSCOPY REPORT     PROCEDURE(S) PERFORMED:  Colonoscopy.     INDICATION(S):  Chronic diarrhea.     DETAILS OF PROCEDURE(S):  Sedation was administered by the anesthesiology  service using MAC anesthesia.  A colonoscope was advanced from the anus to as  far as the terminal ileum.  The quality of the bowel preparation was  excellent.  There was no evidence of any proctitis, colitis, ileitis,  vascular lesions or any neoplasia.  She does have some sigmoid  diverticulosis.  Biopsies were taken throughout the large intestine to rule  out microscopic colitis.     IMPRESSION:  Normal colonoscopy.  Random colon biopsies are pending at this  time.  If these biopsies are normal, we would do some stool testing to  further evaluate the cause of her diarrhea.                                            Lurene Shadow, M.D.  NS/sd  D:  01/23/2013 13:17  T:  01/24/2013 08:40  Job #:  [id number]     c:   [name], M.D.     ENDOSCOPY REPORT                                             PAGE    1 of   1

## 2013-01-23 NOTE — Unmapped (Signed)
Anesthesia Post Note    Patient: Joanna Lopez    Procedure(s) Performed: Procedure(s):  COLONOSCOPY WITH MAC    Anesthesia type: MAC    Patient location: Same Day Surgery    Post pain: Adequate analgesia    Post assessment: no apparent anesthetic complications and tolerated procedure well    Last Vitals:   Filed Vitals:    01/23/13 1356   BP: 151/83   Pulse: 58   Temp: 98 ??F (36.7 ??C)   Resp: 15   SpO2: 96%       Post vital signs: stable    Level of consciousness: awake, alert  and oriented    Complications: None

## 2013-01-23 NOTE — Unmapped (Addendum)
Discharge instructions to pt and her husband. Physician talked to them

## 2013-01-23 NOTE — Unmapped (Signed)
H&P reviewed, patient examined, no changes to H&P.

## 2013-01-23 NOTE — Unmapped (Signed)
ANESTHESIOLOGY PRE-PROCEDURAL EVALUATION    Joanna Lopez is a 62 y.o. year old female presenting for:    Procedure(s):  COLONOSCOPY WITH MAC    Surgeon:   Lurene Shadow, MD    Anesthesia Evaluation    Patient summary reviewed.       I have reviewed the History and Physical Exam, any relevant changes are noted in the anesthesia pre-operative evaluation.    Medical History / Review of Systems:    Cardiovascular:    Hypertension is.      Neuro/Muscoloskeletal/Psych:        Pulmonary:    Sleep apnea.        GI/Hepatic/Renal:    GERD is.      Endo/Other:          PAST MEDICAL HISTORY:  Past Medical History   Diagnosis Date   ??? GERD (gastroesophageal reflux disease)    ??? Hypertension    ??? Hyperlipidemia    ??? IBS (irritable bowel syndrome)    ??? Basal cell cancer    ??? OSA (obstructive sleep apnea)        PAST SURGICAL HISTORY:  Past Surgical History   Procedure Laterality Date   ??? Appendectomy     ??? Uvp     ??? Submastoid gland removal     ??? Uvulopalatopharyngoplasty     ??? Tonsillectomy         FAMILY HISTORY:  Family History   Problem Relation Age of Onset   ??? Adopted: Yes   ??? Diabetes Son        SOCIAL HISTORY:  History     Social History   ??? Marital Status: Married     Spouse Name: N/A     Number of Children: N/A   ??? Years of Education: N/A     Occupational History   ??? Not on file.     Social History Main Topics   ??? Smoking status: Never Smoker    ??? Smokeless tobacco: Not on file   ??? Alcohol Use: Yes      Comment: 2-3 wine/week   ??? Drug Use: No   ??? Sexually Active: Not on file     Other Topics Concern   ??? Caffeine Use No     1 can coke/day   ??? 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 Exercise No   ??? Seat Belt Yes     Social History Narrative   ??? No narrative on file       MEDICATIONS:  No current facility-administered medications on file prior to encounter.     Current Outpatient Prescriptions on File Prior to Encounter   Medication Sig Dispense Refill   ??? calcium carbonate (OS-CAL) 600  mg (1,500 mg) Tab Take 600 mg by mouth 2 times a day with meals.       ??? cholecalciferol, vitamin D3, 1000 units tablet Take 1,000 Units by mouth daily.       ??? citalopram (CELEXA) 20 MG tablet Take 20 mg by mouth daily.       ??? dicyclomine (BENTYL) 20 mg tablet Take 1 tablet (20 mg total) by mouth 2 times a day.  120 tablet  6   ??? esomeprazole (NEXIUM) 40 MG capsule Take 40 mg by mouth every morning before breakfast.       ??? lactobacillus rhamnosus, GG, (CULTURELLE) 10 billion cell capsule Take 2 capsules by mouth daily.       ???  lisinopril (PRINIVIL,ZESTRIL) 10 MG tablet Take 10 mg by mouth daily.       ??? simvastatin (ZOCOR) 40 MG tablet Take 40 mg by mouth at bedtime.           ALLERGIES:  Allergies   Allergen Reactions   ??? Formaldehyde Analogues    ??? Penicillins    ??? Sulfa (Sulfonamide Antibiotics)        VITAL SIGNS:  Wt Readings from Last 3 Encounters:   12/29/12 208 lb (94.348 kg)   11/15/12 214 lb (97.07 kg)   10/18/12 212 lb (96.163 kg)     Ht Readings from Last 3 Encounters:   12/29/12 5' 6.5 (1.689 m)   11/15/12 5' 6.5 (1.689 m)   10/18/12 5' 6.5 (1.689 m)     Temp Readings from Last 3 Encounters:   No data found for Temp     BP Readings from Last 3 Encounters:   12/29/12 140/96   11/15/12 128/92   10/18/12 122/72     Pulse Readings from Last 3 Encounters:   12/29/12 68   11/15/12 76   10/18/12 78     SpO2 Readings from Last 3 Encounters:   08/29/12 97%   07/21/12 97%       Physical Exam:    Airway:     Mallampati: II    Dental:        Pulmonary:   - normal exam     Cardiovascular:  - normal exam     Neuro/Musculoskeletal/Psych:      Abdominal:     Obese.    Current OB Status:       Other Findings:        LAB RESULTS:  No results found for this basename: WBC, HGB, HCT, MCV, PLT       No results found for this basename: ABORH       No results found for this basename: GLUCOSE, BUN, CO2, CREATININE, K, NA, CL, CALCIUM, ALBUMIN, PROT, ALKPHOS, ALT, AST, BILITOT       No results found for this basename: PT,  PTT, INR       No results found for this basename: PREGTESTUR, PREGSERUM, HCG, HCGQUANT       Anesthesia Plan:    ASA 2     Anesthesia Type:  MAC.       Anesthetic plan and risks discussed with patient.          Plan discussed with CRNA.

## 2013-01-23 NOTE — Unmapped (Signed)
COLONOSCOPY WITH MAC  Procedure Note    Brief postop note:    Diagnosis:  -normal colonoscopy    Complications: -none immediate    EBL   -none    Specimens:  -random colon biopsies    Medications given: -IV MAC  per anesthesia team          Lurene Shadow     Date: 01/23/2013  Time: 1:18 PM

## 2013-01-24 ENCOUNTER — Inpatient Hospital Stay: Admit: 2013-01-24 | Payer: PRIVATE HEALTH INSURANCE | Attending: Critical Care Medicine

## 2013-01-24 DIAGNOSIS — R918 Other nonspecific abnormal finding of lung field: Secondary | ICD-10-CM

## 2013-01-24 NOTE — Unmapped (Signed)
Message copied by Beecher Mcardle on Tue Jan 24, 2013  9:39 PM  ------       Message from: Lurene Shadow       Created: Tue Jan 24, 2013  3:44 PM         This woman saw me in office for VERY-chronic diarrhea.       Colonoscopy with random biopsies normal.       Please let her know bx's were normal and please have her come in for stool testing to check:       Stool electrolytes and osmolarity, fecal elastase, qualitative fecal fat.       Thanks. -NS                     ----- Message -----          From: [facility] [place] Interface          Sent: 01/24/2013   2:54 PM            To: Lurene Shadow, MD                       ------

## 2013-01-25 NOTE — Unmapped (Signed)
Attempted to reach pt to discuss results, no answer. Left Vm to call my direct line at earliest convenience.

## 2013-01-30 NOTE — Unmapped (Signed)
Pt returned my call. Colo/biopsy results discussed in detail. She agrees to complete stool tests and would like to complete at [facility].   Orders placed and we will call her when results are available.  Advised to call office if she doesn't hear about her test results within 4-5 days after completing.

## 2013-01-30 NOTE — Unmapped (Addendum)
I have attempted to reach pt multiple times with no return call.   I left another message this morning.  Letter mailed to contact our office regarding results and to schedule labs (stool testing listed below).

## 2013-01-30 NOTE — Unmapped (Signed)
Addended by: Beecher Mcardle on: 01/30/2013 02:56 PM     Modules accepted: Orders

## 2013-02-02 ENCOUNTER — Ambulatory Visit: Admit: 2013-02-02 | Discharge: 2013-02-02 | Payer: PRIVATE HEALTH INSURANCE | Attending: Critical Care Medicine

## 2013-02-02 DIAGNOSIS — R918 Other nonspecific abnormal finding of lung field: Secondary | ICD-10-CM

## 2013-02-02 NOTE — Unmapped (Signed)
History of Present Illness:      Joanna Lopez is a 62 y.o. female.    HPI  Here for f/u multiple pulmonary nodules. She denies cough. No constitutional sx other than occasional sweat she attributes to menopause- no change.       The following portions of the patient's history were reviewed and updated as appropriate: allergies, current medications, past family history, past medical history, past social history, past surgical history and problem list.    Review of Systems   Constitutional: Negative for fever, chills, diaphoresis, activity change, appetite change, fatigue and unexpected weight change.   HENT: Negative for hearing loss, ear pain, nosebleeds, congestion, sore throat, facial swelling, rhinorrhea, sneezing, drooling, mouth sores, trouble swallowing, neck pain, neck stiffness, dental problem, voice change, postnasal drip, sinus pressure, tinnitus and ear discharge.    Eyes: Negative for photophobia, pain, discharge, redness, itching and visual disturbance.   Respiratory: Negative for apnea, cough, choking, chest tightness, shortness of breath, wheezing and stridor.    Cardiovascular: Negative for chest pain, palpitations and leg swelling.   Gastrointestinal: Negative for nausea, vomiting, abdominal pain, diarrhea, constipation, blood in stool, abdominal distention, anal bleeding and rectal pain.   Genitourinary: Negative for dysuria, urgency, frequency, hematuria, flank pain, decreased urine volume, vaginal bleeding, vaginal discharge, enuresis, difficulty urinating, genital sores, vaginal pain, menstrual problem, pelvic pain and dyspareunia.   Musculoskeletal: Negative for myalgias, back pain, joint swelling, arthralgias and gait problem.   Skin: Negative for color change, pallor, rash and wound.   Neurological: Negative for dizziness, tremors, seizures, syncope, facial asymmetry, speech difficulty, weakness, light-headedness, numbness and headaches.   Hematological: Negative for adenopathy.  Does not bruise/bleed easily.   Psychiatric/Behavioral: Negative for suicidal ideas, hallucinations, behavioral problems, confusion, sleep disturbance, self-injury, dysphoric mood, decreased concentration and agitation. The patient is not nervous/anxious and is not hyperactive.    All other systems reviewed and are negative.        Objective:   Blood pressure 130/84, pulse 82, height 5' 6 (1.676 m), weight 209 lb 9.6 oz (95.074 kg), SpO2 95.00%.    Physical Exam   Nursing note and vitals reviewed.  Constitutional: She is oriented to person, place, and time. She appears well-developed and well-nourished. No distress.   HENT:   Head: Normocephalic and atraumatic.   Right Ear: External ear normal.   Left Ear: External ear normal.   Nose: Nose normal.   Mouth/Throat: Oropharynx is clear and moist. No oropharyngeal exudate.   TMs with good cone of light bilaterally, s/p UPPP   Eyes: Conjunctivae and EOM are normal. Right eye exhibits no discharge. Left eye exhibits no discharge. No scleral icterus.   Neck: Neck supple.   Cardiovascular: Normal rate and regular rhythm.  Exam reveals no gallop and no friction rub.    No murmur heard.  Pulmonary/Chest: No stridor. No respiratory distress. She has no wheezes. She has no rales. She exhibits no tenderness.   Musculoskeletal: She exhibits no edema and no tenderness.   No clubbing   Lymphadenopathy:     She has no cervical adenopathy.   Neurological: She is alert and oriented to person, place, and time. No cranial nerve deficit. Coordination normal.   Skin: Skin is warm and dry. No rash noted. She is not diaphoretic. No erythema. No pallor.   Psychiatric: She has a normal mood and affect. Her behavior is normal. Judgment and thought content normal.       Lab Review:   CT chest  reviewed and discussed with pt/husband and compared to previous Cts    Prior Diagnostic Testing:  Previous CT chest reviewed  PSG results reviewed     Assessment:     1. Multiple pulmonary nodules- no  change after 6 months. Will obtain repeat in 6 months, and if no change- 1 year follow up to complete 2 year surveillance.    2. OSA- patient is being evaluated by sleep medicine.        Plan:     RTC 6 months with CT chest before appointment             Medical Decision Making:  The following items were considered in medical decision making:  Review / order radiology tests  Independent review of radiologic images

## 2013-02-10 ENCOUNTER — Inpatient Hospital Stay: Admit: 2013-02-10 | Payer: PRIVATE HEALTH INSURANCE

## 2013-02-10 DIAGNOSIS — R197 Diarrhea, unspecified: Secondary | ICD-10-CM

## 2013-02-10 LAB — FECAL FAT, QUALITATIVE
Fat Qual Neutral, Stl: INCREASED
Fat Quant, Stl: INCREASED

## 2013-02-10 LAB — PANCREATIC ELASTASE, FECAL: Pancreatic Elastase-1: 181 ug Elast./g — ABNORMAL LOW (ref >200)

## 2013-02-15 NOTE — Unmapped (Signed)
Eric with [facility] called to inform the nurse the fecal Osmolality test was cancelled due to the stool being too formed to test.

## 2013-02-15 NOTE — Unmapped (Signed)
Noted.  Stool electrolytes still pending. I will wait to see if that also gets cancelled due to stool being too formed before calling pt to repeat test.

## 2013-02-21 NOTE — Unmapped (Signed)
Test was cancelled for the Fecal Electrolytes, stool was to solid.

## 2013-02-21 NOTE — Unmapped (Signed)
Joanna Lopez,  Pt's stool osmo and electrolytes were cancelled as sample was too solid.  Can you please call pt and let her know we would like for her to please bring in another sample.   We do have some of the other stool results, but would like the osmo/electrolyte results before making management decisions.   Please tell her to make sure it is a very loose Bm.  I placed new orders.    Thanks,  [employer]

## 2013-02-22 NOTE — Unmapped (Signed)
Patient advised to pick up new containers and collect the stool when the stool is very very loose.  She understands and will get on that right away

## 2013-04-19 NOTE — Unmapped (Signed)
Patient states unable to collect the specimen needed - advised to try and get it done but she was a little resistent - appt made for January and again advised to get the testing done prior

## 2013-04-19 NOTE — Unmapped (Signed)
Joanna Lopez,  Can you please call pt and see if she was able to repeat her stool osmo/electrolyte testing, because I still haven't seen the results and we re-ordered it almost 2 months ago.   She had other stool testing (specifically a low pancreatic elastase and elevated fecal fat) that we would like to discuss the results of, but before deciding management we would like the other stool tests completed to make sure we aren't missing any other possible other causes for her diarrhea.    Thanks,  [employer]

## 2013-06-06 ENCOUNTER — Ambulatory Visit: Admit: 2013-06-06 | Discharge: 2013-06-06 | Payer: PRIVATE HEALTH INSURANCE

## 2013-06-06 DIAGNOSIS — R197 Diarrhea, unspecified: Secondary | ICD-10-CM

## 2013-06-06 MED ORDER — lipase-protease-amylase (CREON) 24,000-76,000 -120,000 unit CpDR
24000-76000 | ORAL_CAPSULE | Freq: Three times a day (TID) | ORAL | Status: AC
Start: 2013-06-06 — End: 2013-06-07

## 2013-06-06 NOTE — Unmapped (Signed)
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   Had EGD, SBBx's, colonoscopy at [facility] and recent 9/14 colon biopsies all nondiagnostic. OSH noncontrast CT scan was normal.   Continues to have about 3-7x/day, and worse after dairy. Describes foul odor to BMs and significant gassiness but not losing weight.   Also mentions that she needs to avoid beans, onions and cabbage.   On bentyl to 20mg  po bid, she does have some formed BMs, but still quite frequent. No response previously to probiotic.  Symptoms got worse after trip to [place] 72yrs ago.   No abdominal pains. No weight loss. Still has GB.   PMH: HTN, HL, OSA, depression.   PSH: UPPP, appy.   Adopted.  Married to a Education officer, environmental, who is nor retired; modest alcohol intake. Never smoker.  Fecal fat was normal but fecal elastase a bit low.    ROS form completed by patient and reviewed in office.   PE:  Nad; vss; afeb   Anicteric   No rash   nonacute exam   MS- appropriate     Assessment:   Chronic diarrhea.   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  Asked her stop bentyl, and try her on pancreatic enzymes and ask her to pick up, for me, images from her 2012 abdominal CT scan at OSH, so we can review.  No obvious history of pancreatic disease or obvious causes, and no FHx as she is adopted.  Will start creon 24,000 2 pills with each meal.   RTO 3 weeks to re-eval symptoms and OSH CT.

## 2013-06-07 MED ORDER — lipase-protease-amylase (CREON) 24,000-76,000 -120,000 unit CpDR
24000-76000 | ORAL_CAPSULE | ORAL | Status: AC
Start: 2013-06-07 — End: ?

## 2013-06-07 NOTE — Unmapped (Signed)
06-08-13 ESS -   WT:  LOV: 08/29/12 ESS -  ? WT:  211.6 LBS   (ANU RAO, PA-C)  Initial f/up post NPSG.  Cherly Hensen, MA      NPSG:  09-09-12  SE:  76.5% AHI:  2.7 MIN OX:  81% N PLMAI:  0 RERAI:  17.5  Study wt:  211.0 lbs

## 2013-06-08 ENCOUNTER — Inpatient Hospital Stay: Admit: 2013-06-08 | Payer: PRIVATE HEALTH INSURANCE

## 2013-06-08 ENCOUNTER — Ambulatory Visit: Admit: 2013-06-08 | Discharge: 2013-06-08 | Payer: PRIVATE HEALTH INSURANCE

## 2013-06-08 DIAGNOSIS — D509 Iron deficiency anemia, unspecified: Secondary | ICD-10-CM

## 2013-06-08 LAB — IRON STUDIES
% Iron Saturation: 25.6 % (ref 15.0–55.0)
Iron: 86 ug/dL (ref 50–212)
TIBC: 336 ug/dL (ref 265–497)

## 2013-06-08 LAB — FERRITIN: Ferritin: 67.2 ng/mL (ref 11.0–306.8)

## 2013-06-08 MED ORDER — roPINIRole (REQUIP) 0.25 MG tablet
0.25 | ORAL_TABLET | Freq: Every day | ORAL | 1.00 refills | 30.00000 days | Status: AC
Start: 2013-06-08 — End: ?

## 2013-06-08 NOTE — Unmapped (Signed)
Instructions for your Sleep Study           LOCATION AND DIRECTIONS    Your study will be conducted at our [facility] on [address] [place], [place] [address] (Please note this is a different location from where you had your clinic appointment).     Driving Directions: Once you turn onto [address] go straight past the first stop sign, at the second stop sign, turn right. Once you turn, you will see a ???[facility]??? sign. You can park in the lot in front of it. The door under the awning is the Sleep Medicine Center. Go through the sliding doors, there is doorbell on the wall, please press the button and a technician will let you in.     Overnight studies begin at 8pm (please do not arrive earlier). The wake up time for the study is between 5-6 am. If you need to wake up sooner, please notify the technician before going to sleep.       If someone will be picking you up in the morning following your study, you must inform the sleep technician the night before, so they can allow you time to call for your ride.     WHAT TO BRING    You should bring your toiletries, sleepwear, a robe or sweater (in case you get cold), and a change of clothing for when you leave. Wear your regular clothing when you arrive. Please BRING your night-time clothes with you. Please note leads will be placed on your scalp and chest. Please wear something loose and comfortable.  You will be given time to change.     Medications: Please bring a complete list of all your medications (including vitamins, supplements or medications you take as needed). BRING your nighttime medications with you, they have to be in their original packaging (you will be given an opportunity to take your nighttime medications before you sleep). If you normally use sleep aids or medications for restless legs or periodic limb movement disorder such as Ambien, Lunesta, Melatonin, Mirapex, Requip etc please bring them with you. Notify the  technician of all medications you take that night.     BRING your insurance card and picture ID.     Optional: Bring your own pillow and/or blanket, snacks and sweater.     If you normally wear a dental guard at night please bring it with you for the study. Ideally we would like to study you without the guard in place to establish what your normal sleep pattern is.      If you have a PAP device please bring your equipment and supplies to the study. If any pressure changes are needed a preliminary adjustment may be made.    WHAT TO DO BEFORE YOUR SLEEP TEST (Please follow the same instructions for a home sleep test)      Shower before you come for the test (this way there are no oils on the skin for the study). DO NOT apply lotion, creams, make-up, nail polish or put hair products (such as hair spray or gel). Your hair should be dry when you arrive. Each room has its own bathroom and shower. You will be able to shower the following morning if you wish. Be sure to bring your own toiletries as these are NOT provided.     Female Patients: Unless you have a beard, please be ???clean shaven???    DO NOT take any naps on the   day of the study.     DO NOT have any chocolate or caffeine after 2 pm.     Please inform the technician if you have any allergies or sensitivities, before the technician starts to apply the leads (stickers).      If you need to cancel or re-schedule, you must give us at least 24 HOURS notice or you may be charged a cancellation fee. Please call our office at [phone], option #1 or our sleep lab at [phone] (leave a voicemail) to inform us of changes.     If you have a terrible cold or received some bad news on the day of the study, please contact our office to reschedule. Please follow all protocols regarding cancellation and no show policies. If you have any questions or concerns please call our office for further details, call [phone] Option #1.      AFTER YOUR SLEEP STUDY  ?? After your first  study (a diagnostic study), you will receive a call within 7-10 business days with your results.   ?? All patients are advised to make a follow up appointment to discuss the results of the sleep study in detail. If any sleep disorder is identified various treatment options will be discussed during that visit.   ?? If you have a home sleep test, you will receive a call after 7-10 business days with your result. All patients are requested to make a follow up appointment to review your results.   ?? If you have a Positive airway pressure device or PAP device, please bring  your equipment  and all supplies to EVERY office visit (including your machine, tubing, mask and power cord). Do not travel with water in the water chamber. We do a thorough check on your device at every visit.     Obstructive Sleep Apnea    Obstructive sleep apnea is a qualitative sleep disorder. What that means is that sleep apnea is an abnormal process that degrades the quality of sleep.    Sleep is a restorative process for your brain. Even though you are physically inactive when you go to sleep your brain is going through a process that results in rejuvenation of your brain energy stores, processing of your daily experiences both of which which hopefully makes an improvement in the way your brain functions.  It is a somewhat complicated process but think of sleep like a combination of a battery recharge and a computer hard drive drive fragmentation procedure. If you're not a computer wiz then forget about the fragmentation part!    When the brain goes to sleep it powers down. In the power down state the brain is working on the recharge/rejuvenation process, and is monitoring vital functions to prevent any disasters from occurring but is otherwise off-line.    Obstructive sleep apnea is a bit of a misnomer. Apnea means the absence of breathing. In obstructive sleep apnea there is no absence of breathing effort but rather you are breathing against  a partially or completely blocked upper airway.    Let's learn some anatomy. The upper airway includes the channels of your nose, the channel of your mouth, and the confluence of the nose and mouth channels in the back of your throat extending down to the level of the vocal cords. The back of the throat can be thought of as a muscular tube. In order to remain open, the upper airway muscles must continually receive input from the brain instructing the muscles on how   to contract appropriately so that the airway stays open. Contrast this with the trachea. The trachea begins just below the level of the vocal cords. It is a membranous tube that has little C-shaped pieces of cartilage in the wall of the trachea which extend from the vocal cords down into the chest. These little pieces of cartilage hold the tracheal tube open all the time. The trachea requires no brain input to keep the airway open. The trachea is open when you are awake, asleep, alive, or dead!    In obstructive sleep apnea, when the brain goes to sleep to control of the upper airway is compromised. The brain does not adequately control the activity of the muscles to keep it optimally open. This is a problem. Think of it as suffocation. The brain cannot tolerate suffocation. This problem occurrs because of the brain power down process (i.e. sleep). The brain must power back up, to a degree, in order to remedy this situation. This does not necessarily mean that you will wake up when your airway collapses but your brain will move from a sleep mode to a more activated mode (but not necessarily a conscious mode). This causes the sleep process to come to a halt. Once the activated brain appropriately talks to the airway muscles to get the airway open then the crisis is over. Then the brain powers back down to get back into the sleep mode. With the power down, you are once again at risk for an airway collapse. This problem of repetitive airway collapse and the  need for the brain to activate to rescue the airway creates sleep fragmentation. Sleep fragmentation results in poor quality sleep. Poor quality sleep then can lead to excessive sleepiness the next day. Excessive sleepiness compromises your ability to stay awake, your ability to be attentive, your ability to remember things, and may make you a bit irritable. None of these things is favorable. In addition you did not want to be limited in your daily activities by tiredness. You wanted be the best that you can be!      Now let's talk about some other issues associated with obstructive sleep apnea.     Repetitive suffocation episodes during sleep from sleep apnea is physiologically stressful. This translates into an increased risk of myocardial infarction (heart attack), irregular heart rhythms (such as atrial fibrillation), and stroke. If you have high blood pressure (hypertension) sleep apnea many make it more difficult to control your blood pressure with medications. There is some concern that repetitive oxygen drops during sleep might damage the brain over the long run.  Sleepiness can impair your ability to operate a motor vehicle, other equipment and can effect your ability to make critical decisions. There is a 3-4 fold higher rate of motor vehicle accidents in patients with obstructive sleep apnea. Motor vehicle accidents caused by sleepiness are more likely to result in mortalities (probably because you run into things without making any effort at a corrective action).  A partially closed airway that occurs with obstructive sleep apnea can lead to loud snoring. Loud snoring can affect the quality and quantity of your bed partner's sleep. When your partner sleeps poorly and you sleep poorly things may not go so well! Domestic harmony may be facilitated by eliminating snoring.      The good news is that sleep apnea is a treatable condition.      Sleep Hygiene     Sleep disruption is common, especially during  times when you   may feel emotionally overwhelmed. Anxieties, relentless replay of the day???s events and heightened emotions may significantly interfere with your sleep. Lack of sleep robs you of needed rest, making management of your illness more difficult.   Bring sleep patterns under control and working at a consistent, stable pattern is very important to illness management. You need your rest. The most common cause of insomnia is a change in your daily routine. For example traveling, change in work hours, disruption of other behaviors (eating, exercise, leisure, etc.), relationship conflicts may cause sleep problems. Paying attention to good sleep hygiene is the most important thing you can do to maintain good sleep.     Do:   1. Go to bed at the same time each day.   2. Get up from bed at the same time each day.   3. Get regular exercise each day, preferably in the morning. There is good evidence that regular exercise improves restful sleep. This includes stretching and aerobic exercise.   4. Get regular exposure to outdoor or bright lights, especially in the late afternoon.   5. Keep the temperature in your bedroom comfortable.   6. Keep the bedroom quiet when sleeping   7. Keep the bedroom dark enough to facilitate sleep.   8. Use your bed only for sleep and sex.   9. Take medications as directed. It???s often helpful to take prescribed sleeping pills one hour before bedtime, so they are causing drowsiness when you lie down, or 10 hours before getting up to avoid daytime drowsiness.   10. Use a relaxation exercise just before going to sleep, (muscle relaxation, imagery, massage, warm baths).   11. Keep your feet and hands warm. Wear warm socks and/or mittens or gloves to bed.     Don???t:   1. Exercise just before going to bed.   2. Engage in stimulating activity just before bed, such as playing a competitive game, watching an exciting program on TV or movie, or having an important discussion with a loved one.   3.  Have caffeine in the evening (coffee, many teas, chocolate, sodas, etc). No caffeine after noon.    4. Read or watch TV in bed.   5. Use alcohol to help you sleep.   6. Go to bed too hungry or too full.   7. Take another person???s sleeping pills.   8. Take over-the-counter sleeping pills without your doctor???s knowledge. Tolerance can develop rapidly with these medications. Diphenhydramine (an ingredient commonly found in over-the-counter sleep medications) can have serious side effects for elderly patients.   9. Take daytime naps.   10. Command yourself to go to sleep. This only makes your mind and body more alert.   11. Do not look at the time when you wake up in the middle of the night. Remove the clocks from the bedroom or set your alarm for the morning and have it face the wall.   If you lie in bed awake for more than 20-30 minutes, get up, go to a different room (or different part of the bedroom), and participate in a quiet activity (non-excitable reading or TV) then return to bed when you feel sleepy. Do this as many times during the night as needed.    Excessive daytime sleepiness/drowsiness    Excessive sleepiness is a situation where your brain is suffering from a lack of sleep or from a medication with sedative side effects. When you are sleepy you are at risk if you are operating a   motor vehicle, other equipment or during critical decision-making processes.    Sleepiness is natural when you are at the end of the day near the time of your usual bedtime.  Sleepiness can occur at other times as well.  Inappropriate sleepiness can be caused by sleep disorders that degrade the quality of your sleep, by bad habits where you purposely or inadvertently short yourself with regard to adequate amounts of sleep, bad habits such as varying the time you go to bed and get up or, through no-fault of your own, when you are in a stressful situation and you have difficulty initiating and maintaining sleep at your usual time.     Many medications can have sleepiness or sedation as a side effect.  Common such medications include narcotic pain relievers (such as codeine, Percocet, Vicodin, morphine etc.), muscle relaxants (such as Flexeril or Soma), tranquilizers taken for anxiety (Ativan, Xanax, Valium etc.),  other tranquilizers ( such as Geodon, Risperdal, Haldol etc.) amongst many other medications.  It should be fairly obvious that these medicines could cause you to be excessively sleepy.  There are other medications that can produce sedation which are less obvious such as prescription and over-the-counter antihistamines, Chantix, Requip, Mirapex, Sinemet, older antidepressants (e.g. Elavil (amitriptyline), medications that are used to treat seizures and many others.    The nature of sleepiness is that the brain does not react as quickly as it would otherwise do when we are more rested.  Your reaction time will be slower.  You may find it harder to do a simple task and complex task might even allude you.  You can be irritable and have trouble interacting with other people.  In addition to the inattentiveness, memory issues and irritability you are at risk of inadvertently falling asleep.    If you follow asleep when you???re operating a motor vehicle the potential for a disaster is obvious.  If you are lucky, the rumble strip will wake you up.  Otherwise it may be the bridge abutment or the oncoming traffic.  When you fall asleep while driving you cannot will yourself back from sleep.  Your brain will wake up when it is refreshed (which will not happen if you???re behind the wheel of a car or operating other equipment) or when you???re awakened by whatever you hit.  When you get the warning signs of sleepiness such as head bobbing, eyelids drooping, relaxing back into the car seat you need to recognize these warning signs and stop driving or figure out a way to activate your brain to allow continued driving to be safe.  If you don???t get  warning signals that you???re sleepy and you???ve had problems properly controlling your vehicle, have had near misses or accidents due to sleepiness then you should stop driving and not resume until the problem is adequately addressed.  It is you???re responsibility to curtail or eliminate you???re driving when you cannot stay awake.    If you???re an equipment operator, sleepiness can put you at risk for on-the-job injuries or death.  If you are too sleepy to operate the equipment it is your responsibility and duty to stop.    In addition to risk of injury or death, sleepiness in the workplace can lead to reduced productivity, problems with interpersonal relations due to irritability, and errors.  Depending on your job impairment due to sleepiness could have varying degrees of impact both to you, to the people around you, and perhaps to others.       If you???re under treatment for a qualitative problem with your sleep and you are still sleepy to the point you cannot safely drive, operate equipment necessary for your job or if you???re sleepiness is impairing your ability to make appropriate decisions then you need to make an appointment with your sleep provider to address this problem as soon as possible.  In the meantime, you need to undertake whatever measures are necessary to ensure your safety and the safety of others around you.      DROWSY DRIVING TIPS    These suggestions will help prevent you from the risk of drowsy driving.     1. If you feel tired or drowsy don't drive. Sleepiness is a major cause of motor vehicle accidents and accounts for 40% of all fatal crashes reported on the [place] thru[place]. No matter how much you think you can control sleepiness, you can't.     2. Ensure you follow your doctor's advice about the treatment for your sleep disorder. For example, if you have sleep apnea and use CPAP, ensure you use it fully the night before your trip.     3. Get a good night's sleep before driving. Do not cut yourself  short of sleep if you plan a long drive the next day. Get to bed early and do not stay up late packing.     4.Avoid alcohol both the night before your trip and during your trip. Alcohol will disrupt sleep and make you more tired the next day. Sleepiness and alcohol are additive in increasing impairment of your driving ability.     5. Avoid any sedative medications, including sedative antihistamines that are often contained in cold or allergy medications, the night before you drive as they may have long lasting effects the next day.     6. Travel during non-sleeping hours. Accidents due to sleepiness are more common during the nighttime hours.     7. If sleepy, stop and rest. Drink coffee and walk around. Take a brief nap, lock your car doors and take a nap in your car if you are sleepy. Have a 10-15 minute break after every 2 hours of driving.     8. Drive with a companion. Share the driving. Relax in the back seat until it is your time to share the driving again.     These are only guidelines; please discuss your diagnosis and treatment with your sleep medicine provider. Drive safely.

## 2013-06-08 NOTE — Unmapped (Signed)
06-08-13 ESS -19 WT: 215  LOV: 08/29/12 ESS - ? WT: 211.6 LBS (ANU RAO, PA-C)   Initial f/up post NPSG. Wants to go over results, having trouble staying asleep. Previously diagnosed with OSA was given PAP Therapy, used it for a while then had UPPP.    ~~~~~~~~~~~~~~~~~~~~~~~~~~~~~~~~~~~~~~~~~~~~~~~~~~~~~~~~~~~    CHIEF COMPLAINT: previously diagnosed with OSA was given PAP Therapy, used it for a while then had UPPP. Never was restested after UPPP. Now complains of hyeprsomnia, fatigue, frequent nighttime awakenings, leg movts at night, legs restless, memory problems, dry mouth, nasal congestion, waking up unrefreshed, gasp arousals, nocturia, night sweats, witnessed apnea, vivid dreams, muscle jerk in sleep    REFERRING PHYSICIAN: Dr Doylene Canard (ENT); Dr Eleanora Neighbor (pulm)    PCP & Other consulting physicians:     HISTORY  HPI:  63 y.o. with history of hyperlipidemia, hypertension, anxiety/depression, GERD presents with previously diagnosed with OSA was given PAP Therapy, used it for a while then had UPPP. Never was restested after UPPP. Now complains of hyeprsomnia, fatigue, frequent nighttime awakenings, leg movts at night, legs restless, memory problems, dry mouth, nasal congestion, waking up unrefreshed, gasp arousals, nocturia, night sweats, witnessed apnea, vivid dreams, muscle jerk in sleep referred for evaluation of symptoms suggesting a sleep related breathing disorder.     Patient was never retested after herUPPP but notes her snoring never went away after surgery.     She also endorses URGE symptoms but also describes radiating pain from low back down to her feet. Informed could have some radiculopathy that needs to be evaluated by PCP.     EPWORTH SLEEPINESS SCALE: 19 (at or above 10 is abnormal and 24 is the maximum score),      Sleep History and Schedule  Bedtime is 8-9 on weekdays and 10 on weekends. Falls asleep within 3 minutes, with 3 nighttime awakenings. Falls back asleep in 3 minutes. Sleeps on own bed.  Awakens at 6 during the week and 10-11 on the weekends.   Husband bed partner. Patient thinks they get approximately 9-11 hours of sleep a night.      Patient's desired sleep schedule would be to fall asleep at 9 and wake up at 10    Denies cataplexy, sleep paralysis, hypnagogic or hypnopompic hallucinations, dream enactment, and somnambulism.    Denies: bruxism, enuresis, sleepwalking, sleep eating, sleep terrors,   Endorse; all URGE symptoms such as (irresistible urge to move legs, sensation being worse at late evening or when resting, movement improving symptoms) or muscle jerks in sleep.     Sleep Habits   Does NOT drink caffeine 6 hours before bedtime   Does NOT exercise before bedtime   Does NOT reads before bedtime on a e-reader   Watches TV before falling asleep, TV is in the bedroom  Does NOT work on electronic devices before falling asleep or during the night  Patient does takes naps 3-4 days a week for 3-5 hours  Does not use sleeping aid.  Driving while drowsy: No veering to the side of the road,accidents, or near-accidents due to sleepiness while driving.     Social Hx:   Marital Status: married with 3 children  Occupation: Air cabin crew  Tobacco- never  Caffeine - yes 1 cup by 4pm   Alcohol - 1 glass ofwine weekly  Recreational Drugs - no  Exercise - no    Current Medication List:   Outpatient Prescriptions Marked as Taking for the 06/08/13 encounter (Office Visit) with [employer],  MD   Medication Sig Dispense Refill   ??? citalopram (CELEXA) 20 MG tablet Take 20 mg by mouth daily.       ??? dicyclomine (BENTYL) 20 mg tablet Take 1 tablet (20 mg total) by mouth 2 times a day.  120 tablet  6   ??? esomeprazole (NEXIUM) 40 MG capsule Take 40 mg by mouth every morning before breakfast.       ??? lactobacillus rhamnosus, GG, (CULTURELLE) 10 billion cell capsule Take 2 capsules by mouth daily.       ??? lipase-protease-amylase (CREON) 24,000-76,000 -120,000 unit CpDR Take 2 capsules po with each  meal.  180 capsule  6   ??? lisinopril (PRINIVIL,ZESTRIL) 10 MG tablet Take 10 mg by mouth daily.       ??? simvastatin (ZOCOR) 40 MG tablet Take 40 mg by mouth at bedtime.         No Facility-Administered Medications for the 06/08/13 encounter (Office Visit) with Georgena Spurling, MD.       ROS: Endorses: hx of witnessed apneas, daytime sleepiness, (somnloence), fatigue, tiredness, morning headaches, dizziness, palpitations,  dry mouth, nasal congestion,  gasp arousals, waking up unrefreshed,  nocturia,     Denies: suicidal ideas, irritability, depression, racing mind, changes in appetite, fainting spells, alcoholism, tremors, using illegal drugs, worrying about inability to sleep, insomnia, chest pain and shortness of breath, sexual problems, memory problems, concentration difficulties,sleeps in a chair with chest raised, sleeps with a dental guard, drowsy driving,lower extremity edema,    Family Hx:   Family History   Problem Relation Age of Onset   ??? Adopted: Yes   ??? Diabetes Son        Past Medical Hx:   Past Medical History   Diagnosis Date   ??? GERD (gastroesophageal reflux disease)    ??? Hypertension    ??? Hyperlipidemia    ??? IBS (irritable bowel syndrome)    ??? Basal cell cancer    ??? OSA (obstructive sleep apnea)        Problem List:  Patient Active Problem List   Diagnosis   ??? GERD (gastroesophageal reflux disease)   ??? Lung nodule, solitary   ??? Diarrhea       SurgHx:   Past Surgical History   Procedure Laterality Date   ??? Appendectomy     ??? Uvp     ??? Submastoid gland removal     ??? Uvulopalatopharyngoplasty     ??? Tonsillectomy     ??? Colonoscopy N/A 01/23/2013     Procedure: COLONOSCOPY WITH MAC;  Surgeon: Lurene Shadow, MD;  Location: [facility] ENDOSCOPY;  Service: Gastroenterology;  Laterality: N/A;       See scanned Sleep History/Life History Questionnaire for ROS and additional information. This has been reviewed and updated with the patient during this visit.     OBJECTIVE:     Physical:    Vital signs and  nursing intake notes reviewed.   BP 145/96   Pulse 86   Ht 5' 7 (1.702 m)   Wt 215 lb (97.523 kg)   BMI 33.67 kg/m2   SpO2 95%   Constitutional: well groomed, no respiratory distress, not using expiratory accessory muscles, speaking in full sentences, Weight: obese class I   Skin: normal color  Eyes: pupils equal.   Ears/Nose/Throat: external nose normal, no deviated septum, no thrush, normal dentition, modified Mallampati Class 4, uvula surgically absent, macroglossia, tongue ridging.   Neck: no thyroid masses or nodules, no thyromegaly   Chest:  no deformities, no chest wall tenderness, normal chest expansion/symmetry, normal resonance, no wheezing, no crackles, no rhonchi.   Cardiovascular: RRR, clear s1/s2, m/r/g, no peripheral edema,   Extremities: no clubbing, no cyanosis, normal gait, no peripheral edema.   Neurological: cranial nerves grossly intact, appropriate mental status.   Psychological: oriented to time; place; and person, affect and mood appropriate, normal interaction.     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 obese patient with history of  hyperlipidemia, hypertension, anxiety/depression, GERD presents with previously diagnosed with OSA was given PAP Therapy, used it for a while then had UPPP. May 2014 NPSG: no OSA, no PLMD. Now complains of hyeprsomnia, fatigue, frequent nighttime awakenings, leg movts at night, legs restless, memory problems, dry mouth, nasal congestion, waking up unrefreshed, gasp arousals, nocturia, night sweats, witnessed apnea, vivid dreams, muscle jerk in sleep. Given the persistent hypersomnia in light of seemingly adequate TST, a nocturnal polysomnography followed by MSLT is indicated. The patient was instructed in the procedure.      Restless leg syndrome: Patient endorses all URGE symptoms such as irresistible urge to move legs, sensation being worse at late evening or when resting, movement improving  symptoms. Occurs 5-7 nights per week. Patient denies a history of IDA. These symptoms have no association with onset on starting new medications.  Patient was adopted. Endorses leg jerks while sleeping . Laboratory workup for causes of secondary RLS will be performed such as Iron, TIBC and ferritin. Evaluation for the possibly associated PLMS will be accomplished at the time of the upcoming polysomnographic testing.     Requip to be started.    Educated re: non-pharm Rx for RLS    RTC 2 months with labs, NPSG and MSLT  Diet, exercise and proper sleep hygiene were emphasized. Sleep hygiene instructions given.

## 2013-06-09 NOTE — Unmapped (Signed)
Dr Ellin Saba pharmacy is unclear on directions for pts requip - Did you want her to take requip at dinner everyday and add other increases as well? Or at dinner for just 1 day?Pls advise  Thanks   Boyd Kerbs    [facility] but [facility] is filling?

## 2013-06-10 NOTE — Unmapped (Signed)
Henderson Cloud will call her with directions thanks  Harriett Sine please address with pt and/or pharmacy thanks

## 2013-06-12 NOTE — Unmapped (Signed)
Called pt - labs wnl; lm on vm  Cherly Hensen, [place]

## 2013-06-12 NOTE — Unmapped (Signed)
Called pt with Requip instructions & to confirm pharmacy:  [facility]?    Sig: Take 1 tablet (0.25 mg total) by mouth daily with dinner. Take one tablet (0.25 mg) 1-2 hours before bedtime, for three days, then take 2 tablets (0.5 mg) 1-2 hours before bedtime for four days, then take 4 tablets (1 mg) 1-2 hours before bedtime thereafter    Mailbox was full; could not lm.      Cherly Hensen, MA

## 2013-06-12 NOTE — Unmapped (Signed)
Message copied by Cherly Hensen on Mon Jun 12, 2013 10:10 AM  ------       Message from: Georgena Spurling       Created: Sat Jun 10, 2013 11:36 AM         Please call pt, no need for iron supplem, labs ok thanks       ----- Message -----          From: [facility] [place] Interface          Sent: 06/08/2013   2:52 PM            To: Georgena Spurling, MD                       ------

## 2013-06-13 NOTE — Unmapped (Signed)
Pt called and left voicemail regarding script Dr Ellin Saba gave her.  She took it to [facility] on [address]. In [place], [place] and they have called and left 2 messages and a third time was hung up on  They have questions about pts script and will not fill until they are contacted  Pt can be reached at her work number during the daytime [phone]) and cell after that [phone])    Note routed to [employer]

## 2013-06-13 NOTE — Unmapped (Signed)
[facility] and they had a question regarding Creon order. States that no one has returned their call. I attempted to call [facility] to find out the question and their automated service would not connect me with anyone. There is not a telephone note that they have called requesting anything.

## 2013-06-13 NOTE — Unmapped (Signed)
Called pt to review Requip instructions;  Also called [facility]:  [phone] so they can fill rx per pt.  Spoke to [employer].  Cherly Hensen, MA  06/13/2013

## 2013-06-13 NOTE — Unmapped (Signed)
Asking for 90 day supply this was approved and [facility] - patient aware

## 2013-06-20 NOTE — Unmapped (Signed)
Pt would like to schedule sleep studies.

## 2013-06-20 NOTE — Unmapped (Signed)
Left message on voice mail for patient to call, and schedule sleep study and daytime study.

## 2013-06-20 NOTE — Unmapped (Signed)
Message copied by Theodoro Kalata on Tue Jun 20, 2013  9:32 AM  ------       Message from: Burnell Blanks       Created: Wed Jun 14, 2013 10:26 AM       Regarding: Schedule         Okay to schedule sleep study [id number] followed by [phone].              Thank you,       Alinda Money  ------

## 2013-06-26 ENCOUNTER — Inpatient Hospital Stay: Admit: 2013-06-26 | Payer: PRIVATE HEALTH INSURANCE | Attending: Sports Medicine

## 2013-06-26 ENCOUNTER — Ambulatory Visit: Admit: 2013-06-26 | Discharge: 2013-06-26 | Payer: PRIVATE HEALTH INSURANCE | Attending: Sports Medicine

## 2013-06-26 DIAGNOSIS — M9420 Chondromalacia, unspecified site: Secondary | ICD-10-CM

## 2013-06-26 DIAGNOSIS — M1710 Unilateral primary osteoarthritis, unspecified knee: Secondary | ICD-10-CM

## 2013-06-26 MED ORDER — naproxen (NAPROSYN) 500 MG tablet
500 | ORAL_TABLET | Freq: Two times a day (BID) | ORAL | Status: AC
Start: 2013-06-26 — End: 2013-08-03

## 2013-06-26 NOTE — Unmapped (Signed)
Chief Complaint   Patient presents with   ??? New Patient Visit/ Consultation     L knee       HPI:      Joanna Lopez is a 63 y.o. female who presents for evaluation of left knee pain.  The patient is seen at the request of Referral, Self.      The notes and documentation from the physician extender were reviewed, verified, and edited if necessary.       The patient comes in today for evaluation of left knee pain.  She states that approximately 5 years ago, she had an injury where she was told she tore her meniscus.  Following the injury, she had a steroid shot as well as a brace, and reports that her pain resolved with this.  Since that time, she's been pain free with the exception of the past 6 months.  Over that time, she's had increased pain of her left knee.  Pain is retropatellar location.  It is sharp burning in nature with moderate severity.  Her pain is worse with kneeling, rising from a seated position, as well as going down stairs.  Her pain is better with rest.  She's not tried anything for pain.    Past Medical History   Diagnosis Date   ??? GERD (gastroesophageal reflux disease)    ??? Hypertension    ??? Hyperlipidemia    ??? IBS (irritable bowel syndrome)    ??? Basal cell cancer    ??? OSA (obstructive sleep apnea)        Past Surgical History   Procedure Laterality Date   ??? Appendectomy     ??? Uvp     ??? Submastoid gland removal     ??? Uvulopalatopharyngoplasty     ??? Tonsillectomy     ??? Colonoscopy N/A 01/23/2013     Procedure: COLONOSCOPY WITH MAC;  Surgeon: Lurene Shadow, MD;  Location: [facility] ENDOSCOPY;  Service: Gastroenterology;  Laterality: N/A;        Family History   Problem Relation Age of Onset   ??? Adopted: Yes   ??? Diabetes Son        Medication  Current outpatient prescriptions:atorvastatin (LIPITOR) 20 MG tablet, , Disp: , Rfl: ;  citalopram (CELEXA) 20 MG tablet, Take 20 mg by mouth daily., Disp: , Rfl: ;  dicyclomine (BENTYL) 20 mg tablet, Take 1 tablet (20 mg total) by mouth 2 times a  day., Disp: 120 tablet, Rfl: 6;  esomeprazole (NEXIUM) 40 MG capsule, Take 40 mg by mouth every morning before breakfast., Disp: , Rfl:   lactobacillus rhamnosus, GG, (CULTURELLE) 10 billion cell capsule, Take 2 capsules by mouth daily., Disp: , Rfl: ;  lipase-protease-amylase (CREON) 24,000-76,000 -120,000 unit CpDR, Take 2 capsules po with each meal., Disp: 180 capsule, Rfl: 6;  lisinopril (PRINIVIL,ZESTRIL) 10 MG tablet, Take 10 mg by mouth daily., Disp: , Rfl: ;  promethazine (PHENERGAN) 25 MG tablet, , Disp: , Rfl:   roPINIRole (REQUIP) 0.25 MG tablet, Take 1 tablet (0.25 mg total) by mouth daily with dinner. Take one tablet (0.25 mg) 1-2 hours before bedtime, for three days, then take 2 tablets (0.5 mg) 1-2 hours before bedtime for four days, then take 4 tablets (1 mg) 1-2 hours before bedtime thereafter, Disp: 95 tablet, Rfl: 5;  simvastatin (ZOCOR) 40 MG tablet, Take 40 mg by mouth at bedtime., Disp: , Rfl:   naproxen (NAPROSYN) 500 MG tablet, Take 1 tablet (500 mg total) by mouth 2  times a day with meals., Disp: 60 tablet, Rfl: 0    Allergies  Allergies   Allergen Reactions   ??? Formaldehyde Analogues    ??? Penicillins    ??? Sulfa (Sulfonamide Antibiotics)        Review of Systems:  The patient's medical history and review of systems was completed as part of her intake paperwork.  These forms were reviewed with the patient during the visit today, and have been scanned into electronic medical record.  Pertinent items are noted in HPI.    Physical Examination:    This is a pleasant female, alert, and in no acute distress who appears stated age.    Filed Vitals:    06/26/13 0900   BP: 145/96   Height: 5' 7 (1.702 m)   Weight: 215 lb (97.523 kg)       Psychiatric:   The patient's mood is normal and appropriate for their visit     Gait:   Gait:   Normal     Squat:   Quad weakness    Knee Exam:          Right    Left  Tenderness:     Medial joint line:  []                    []     Lateral joint line:  []       []       Patellar compression:  []      [x]      MCL:    []      []      LCL:    []      []      Pes bursa:   []      []      Other:    []      []      Erythema:      negative   negative  Swelling:     negative   negative  Effusion:     negative   negative   Ecchymosis:     negative   negative    ROM:     Flexion:     Normal   Normal  Extension:     Normal   Normal       Extensor mechanism:    intact    intact  Quad atrophy:     negative   negative  Patellar grind:     negative   positive  Patellar apprehension:   negative   negative    Tests:               McMurray's     Right    Left  Medial      negative   negative  Lateral      negative   negative  Lachman's     negative   negative  Varus stress:     negative   negative  Valgus stress:     negative    negative      Vascular exam:   Symmetric pulses in bilateral extremities.     No edema present    Neuro exam:   Symmetric light touch in bilateral extremities     No focal neuro deficits    Skin:     Warm, dry     No erythema     No rash      Radiology:    4 view X-rays of the left knee dated 06/26/2013 were reviewed  independently and discussed with the patient.  The films revealed: Mild osteoarthritis    Assessment:     1. Chondromalacia  Physical Therapy   2. Left knee pain         Plan:    Joanna Lopez is a 63 y.o. 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  Discussed the patient her diagnosis and treatment options.  We will get her started with naproxen as an anti-inflammatory as well as get her started with physical therapy to work on quad strengthening and after stretching.  I will see the patient back in 4 weeks to reevaluate.  An injection was offered during the visit today, the patient declined.    Discussed the patient's diagnosis, exam, imaging (if applicable), and treatment options:  [x]   Discussed return to activity considerations, including the need to avoid exacerbating activity, high impact, or painful activity  [x]   Discussed the use of pain  medication and nonsteroidal anti-inflammatory agents including the risk of side effects which include but not limited to:  the risk of increased blood pressure, bleeding, renal, gastrointestinal, and heart involvement.     [x]  Patient to consider ibuprofen or naprosyn for 1-2 weeks with food.     [x]  The patient can also consider acetaminophen for additional pain if needed.    [x]  Prescription given for pain medication or anti-inflammatory (see Epic orders)   []  May continue on their previously prescribed pain regimen   [x]   Ice 3-4 x/day for 15-20 min and after activity  []   Consider glucosamine supplementation - 1500 mg/day for 2-3 months    []   Weight management   []   Consider brace  [x]   Reviewed the role of a rehabilitative exercise program:   [x]  Patient elected for a referral to physical therapy     []  Patient elected for a home based program and instruction was given in the office today  [x]   Reviewed the role of additional diagnostic and treatment options including the role of further imaging, injection therapy, and possible surgical considerations.      Follow-up: 4 weeks.  Sooner with any problems, questions, concerns, or worsening symptoms.      [x]   A copy of my note was sent electronically to the referring provider through our shared medical record or automated fax system.

## 2013-06-27 NOTE — Unmapped (Signed)
Joanna Lopez,  Please offer pt early appt if needed so we can assess her rash.   I am happy to see her earlier if needed.

## 2013-06-27 NOTE — Unmapped (Signed)
Patient states she has developed a rash similar to one she had 2 years ago. C/o a blistered area on her stomach. States the area is 2x3 and there are many small blisters in the area. Onset 07/20/13. Patient believes it is from Creon or possibly something to do with the pancreas. Joanna Lopez can be reached at [phone].

## 2013-06-28 ENCOUNTER — Ambulatory Visit: Payer: PRIVATE HEALTH INSURANCE

## 2013-06-28 ENCOUNTER — Ambulatory Visit: Admit: 2013-06-28 | Payer: PRIVATE HEALTH INSURANCE

## 2013-06-28 DIAGNOSIS — K8689 Other specified diseases of pancreas: Secondary | ICD-10-CM

## 2013-06-28 MED ORDER — valACYclovir (VALTREX) 1000 MG tablet
1000 | ORAL_TABLET | Freq: Three times a day (TID) | ORAL | Status: AC
Start: 2013-06-28 — End: 2013-08-17

## 2013-06-28 NOTE — Unmapped (Signed)
Patient has appt today at 945 was just scheduled by the call center

## 2013-06-28 NOTE — Unmapped (Signed)
Attempted to reach pt to discuss symptoms. No answer, left Vm to call me or Clydie Braun to discuss.

## 2013-06-28 NOTE — Unmapped (Signed)
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   Had EGD, SBBx's, colonoscopy at [facility] and recent 9/14 colon biopsies all nondiagnostic. OSH noncontrast CT scan was normal.   Continued to have about 3-7x/day, and worse after dairy. Describes foul odor to BMs and significant gassiness but not losing weight.   Also mentions that she needs to avoid beans, onions and cabbage.   On bentyl 20mg  po bid, she had some formed BMs, but still quite frequent. No response previously to probiotic.   Symptoms got worse after trip to [place] 72yrs ago.   No abdominal pains. No weight loss. Still has GB. Never smoker; modest alcohol.  At last visit, because fecal fat was normal but fecal elastase a bit low.   We stopped bentyl and started 2 creons during meals.  Diarrhea down to 3-5x/d, consistency of thick paste, and appetite down, which she is happy about as she wants to lose some weight.  She has also noticed a left lower abd rash and has pain lateral to it on left flank.    PMH: HTN, HL, OSA, depression.   PSH: UPPP, appy.     ROS form completed by patient and reviewed in office.   PE:  Nad; vss; afeb   Anicteric   Anterior left lower abdomen shows pustular eruption.   nonacute exam   MS- appropriate     Assessment:   Chronic diarrhea.   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  Still has some gas, but a bit less.  Does worse if she eats dairy, but even off dairy does not do great.  She is off bentyl now.   She brought in 3/14 OSH CT, which was only a chest CT. I did look at it and it showed some liver cysts and pancreatic tail looked normal, but cannot see much of intestine or right side of pancreas.  Unsure whether she has EPI. Will check noncontrast abd CT to evaluate intestine and pancreas, and call her once reviewed..  Hold lipitor for 1 week, in case zocor/lipitor has been cause  of symptoms.  Rx valtrex for likely shingles rash on trunk.

## 2013-06-30 NOTE — Unmapped (Signed)
Patient states the rash has crusted over. States she needs note for work because they told her she is contagious to the students and pregnant workers. Patient doesn't think it is contagious if it is crusted. Joanna Lopez can be reached at [phone]. Can fax work note to fax # [phone].

## 2013-06-30 NOTE — Unmapped (Signed)
I spoke with Joanna Lopez and she is correct that once the shingles rash has crusted over she is no longer contagious.   Ok to return to work.  Confirmed with Dr. Mickie Bail as well.  Note faxed to [phone].

## 2013-07-04 ENCOUNTER — Inpatient Hospital Stay: Admit: 2013-07-04 | Payer: PRIVATE HEALTH INSURANCE

## 2013-07-04 DIAGNOSIS — R197 Diarrhea, unspecified: Secondary | ICD-10-CM

## 2013-07-05 NOTE — Unmapped (Signed)
Message copied by Beecher Mcardle on Wed Jul 05, 2013  8:20 AM  ------       Message from: Lurene Shadow       Created: Tue Jul 04, 2013  7:55 PM         Ct nondiagnostic.       Chronic diarrhea slightly better on creon but still symptomatic and symptoms unexplained.       Please have her get some serologic testing for neuroendocrine causes before we consider other meds for spasticity.       Labs: gastrin level, VIP level, serotonin level, chromagranin A level.       Thanks. -NS                     ----- Message -----          From: Rad Results In Interface          Sent: 07/04/2013   3:56 PM            To: Lurene Shadow, MD                       ------

## 2013-07-05 NOTE — Unmapped (Signed)
Discussed CT results from pt in detail. All questions answered.   Recommended labs listed below as next step which she agrees to.  Labs ordered and we will call her when results are available.

## 2013-07-08 ENCOUNTER — Inpatient Hospital Stay: Admit: 2013-07-08 | Payer: PRIVATE HEALTH INSURANCE

## 2013-07-08 DIAGNOSIS — R197 Diarrhea, unspecified: Secondary | ICD-10-CM

## 2013-07-08 LAB — VASOACTIVE INTESTINAL PEPTIDE (VIP): Vasoactive Intest Polypeptide: 42.5 pg/mL (ref 0.0–58.8)

## 2013-07-08 LAB — SEROTONIN SERUM: Serotonin: 19 ng/mL (ref 0–420)

## 2013-07-08 LAB — GASTRIN: Gastrin: 22 pg/mL (ref 0–115)

## 2013-07-08 LAB — CHROMOGRANIN A: Chromogranin A: 2 nmol/L (ref 0–5)

## 2013-07-21 MED ORDER — hyoscyamine (LEVSIN) 0.125 mg tablet
0.125 | ORAL_TABLET | Freq: Two times a day (BID) | ORAL | Status: AC | PRN
Start: 2013-07-21 — End: 2016-11-25

## 2013-07-21 NOTE — Unmapped (Signed)
Called pt to discuss lab results which were normal.   No answer, left Vm to call my direct line at earliest convenience.     -Need to discuss starting levsin when she calls back.

## 2013-07-21 NOTE — Unmapped (Signed)
Back line number left to call - needs appt in 15M with NS or SW

## 2013-07-21 NOTE — Unmapped (Signed)
Message copied by Delma Freeze on Fri Jul 21, 2013 12:49 PM  ------       Message from: Beecher Mcardle       Created: Fri Jul 21, 2013 12:28 PM       Regarding: F/u 1 mont me or NS         Clydie Braun,       Can you please arrange f/u in 1 month with either one of [place].              Thanks       [employer]  ------

## 2013-07-21 NOTE — Unmapped (Addendum)
Discussed labs results with pt which were normal.     Of note she did stop her lipitor for a few weeks with some improvement of symptoms, although still having 3 loose/urgent Bm's a day vs 5.   Will go ahead and start a different antispasmodic med (previously failed bentyl).  Will start levsin .125mg  bid prn diarrhea. We may increase dose if helping, but still symptomatic and she will call for these directions if needed.  Educated in detail about the medication, rationale for use and possible SE's.   RTO 1 month.

## 2013-07-21 NOTE — Unmapped (Signed)
Addended by: Beecher Mcardle on: 07/21/2013 12:28 PM     Modules accepted: Orders

## 2013-07-24 NOTE — Unmapped (Signed)
Back line number left again today to call the office to make an appt with NS or SW in 1 month

## 2013-08-02 ENCOUNTER — Inpatient Hospital Stay: Admit: 2013-08-02 | Payer: PRIVATE HEALTH INSURANCE | Attending: Critical Care Medicine

## 2013-08-02 DIAGNOSIS — R918 Other nonspecific abnormal finding of lung field: Secondary | ICD-10-CM

## 2013-08-03 ENCOUNTER — Ambulatory Visit: Admit: 2013-08-03 | Payer: PRIVATE HEALTH INSURANCE | Attending: Sports Medicine

## 2013-08-03 ENCOUNTER — Encounter: Payer: PRIVATE HEALTH INSURANCE | Attending: Critical Care Medicine

## 2013-08-03 DIAGNOSIS — M9420 Chondromalacia, unspecified site: Secondary | ICD-10-CM

## 2013-08-03 MED ORDER — diclofenac (VOLTAREN) 75 MG EC tablet
75 | ORAL_TABLET | Freq: Two times a day (BID) | ORAL | Status: AC
Start: 2013-08-03 — End: 2015-02-07

## 2013-08-03 NOTE — Unmapped (Signed)
Chief Complaint   Patient presents with   ??? Follow-up     L Knee       HPI:      Joanna Lopez is a 63 y.o. female who presents for follow-up evaluation of left knee pain.  The patient was last seen on 06/26/2013.    Since the last visit, Joanna Lopez has noted improvement her pain.  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  Her knee pain has decreased Achilles.  She feels that her strength is improving as well.  No instability.  She states that the naproxen (her stomach after several weeks.  She has discontinued this.  Overall, she feels much better.    The notes and documentation from the physician extender were reviewed, verified, and edited if necessary.    Medication  Current outpatient prescriptions:atorvastatin (LIPITOR) 20 MG tablet, , Disp: , Rfl: ;  diclofenac (VOLTAREN) 75 MG EC tablet, Take 1 tablet (75 mg total) by mouth 2 times a day., Disp: 60 tablet, Rfl: 1;  esomeprazole (NEXIUM) 40 MG capsule, Take 40 mg by mouth every morning before breakfast., Disp: , Rfl:   hyoscyamine (LEVSIN) 0.125 mg tablet, Take 1 tablet (0.125 mg total) by mouth 2 times a day as needed for Cramping. Indications: DIARRHEA, Disp: 60 tablet, Rfl: 1;  lipase-protease-amylase (CREON) 24,000-76,000 -120,000 unit CpDR, Take 2 capsules po with each meal., Disp: 180 capsule, Rfl: 6;  lisinopril (PRINIVIL,ZESTRIL) 10 MG tablet, Take 10 mg by mouth daily., Disp: , Rfl: ;  promethazine (PHENERGAN) 25 MG tablet, , Disp: , Rfl:   roPINIRole (REQUIP) 0.25 MG tablet, Take 1 tablet (0.25 mg total) by mouth daily with dinner. Take one tablet (0.25 mg) 1-2 hours before bedtime, for three days, then take 2 tablets (0.5 mg) 1-2 hours before bedtime for four days, then take 4 tablets (1 mg) 1-2 hours before bedtime thereafter, Disp: 95 tablet, Rfl: 5  valACYclovir (VALTREX) 1000 MG tablet, Take 1 tablet (1,000 mg total) by mouth 3 times a day. Indications: HERPES ZOSTER, Disp: 30 tablet,  Rfl: 1    Allergies  Allergies   Allergen Reactions   ??? Formaldehyde Analogues    ??? Penicillins    ??? Sulfa (Sulfonamide Antibiotics)        Physical Examination:    Constitutional: This is a pleasant female, alert, and in no acute distress who appears stated age.    Filed Vitals:    08/03/13 1615   BP: 145/96   Height: 5' 7 (1.702 m)   Weight: 215 lb (97.523 kg)       Psychiatric:   The patient's mood is normal and appropriate for their visit     Examination of the left knee reveals mild tenderness to palpation in the retropatellar region with patellar compression.  Range of motion is normal, but with crepitus.  5/5 strength in all directions.  No instability is appreciated.  No swelling.    Vascular exam:   Symmetric pulses in bilateral extremities, no edema    Neuro exam:   Symmetric light touch in bilateral extremities     No focal neuro deficits    Skin:     Warm, dry     No erythema     No rash    Assessment:    1. Chondromalacia          Plan:     Overall, the patient is doing better.  I've encouraged her to continue  her physical therapy and home exercises.  We will switch her from naproxen to Voltaren.  She is to slowly wean herself from this.  We discussed that she should continue her exercises and I would see her back in 6 months to reevaluate.      [x]   Discussed return to activity considerations.  [x]   Discussed the use of pain medication and nonsteroidal anti-inflammatory agents including the risk of side effects which include but not limited to:  the risk of increased blood pressure, bleeding, renal, gastrointestinal, and heart involvement.     [x]  Patient to consider ibuprofen or naprosyn for 1-2 weeks with food.     [x]  The patient can also consider acetaminophen for additional pain if needed.    [x]  Prescription given for pain medication or anti-inflammatory (see Epic orders)   []  May continue on their previously prescribed pain regimen   [x]   Ice for 15-20 min after activity and as needed  []    Consider glucosamine supplementation - 1500 mg/day for 2-3 months    []   Weight management   []   Brace/splint as needed for activity  [x]   Reviewed the role of a rehabilitative exercise program:   [x]  Patient elected for a referral or continued physical therapy     []  Patient elected for a home based program and instruction was given in the office today   [x]  Patient can progress to a home maintenance program  [x]   Reviewed the role of additional diagnostic and treatment options including the role of imaging, injection therapy (cortisone and/or viscosupplementation), and possible surgical considerations.    Treatment plan per EPIC orders.    Follow-up 6 months.  Sooner with any problems, questions, concerns, or worsening symptoms.    []    A copy of my note was sent electronically to the referring provider through our shared medical record or automated fax system.

## 2013-08-08 ENCOUNTER — Encounter: Payer: PRIVATE HEALTH INSURANCE | Attending: Pulmonary Disease

## 2013-08-15 ENCOUNTER — Ambulatory Visit: Admit: 2013-08-15 | Discharge: 2013-08-15 | Payer: PRIVATE HEALTH INSURANCE | Attending: Pulmonary Disease

## 2013-08-15 DIAGNOSIS — R911 Solitary pulmonary nodule: Secondary | ICD-10-CM

## 2013-08-15 NOTE — Unmapped (Signed)
History of Present Illness:      Joanna Lopez is a 63 y.o. female.    HPI    Joanna Lopez used to be sen by Dr. Eleanora Neighbor for lung nodules  She has had diarrhea, especially after she returned from [place] few years back.  No hx of smoking, no weight loss. Feels well, with no complaints.  No hemoptysis, no productive cough, no nausea or vomiting / flashing.    Review of Systems   All other systems reviewed and are negative.        Objective:   Blood pressure 118/74, pulse 80, height 5' 7 (1.702 m), weight 202 lb (91.627 kg), SpO2 98.00%.    Physical Exam   Nursing note and vitals reviewed.  Constitutional: She is oriented to person, place, and time. She appears well-developed and well-nourished. No distress.   HENT:   Head: Normocephalic and atraumatic.   Eyes: Conjunctivae and EOM are normal. Pupils are equal, round, and reactive to light. Right eye exhibits no discharge. Left eye exhibits no discharge. No scleral icterus.   Neck: Normal range of motion. Neck supple. No tracheal deviation present. No thyromegaly present.   Cardiovascular: Normal rate and regular rhythm.  Exam reveals no gallop and no friction rub.    No murmur heard.  Pulmonary/Chest: Effort normal and breath sounds normal. No respiratory distress. She has no wheezes. She has no rales.   Abdominal: Soft. Bowel sounds are normal. She exhibits no distension. There is no tenderness.   Musculoskeletal: Normal range of motion. She exhibits no edema and no tenderness.   Neurological: She is alert and oriented to person, place, and time. She has normal reflexes. She displays normal reflexes. No cranial nerve deficit. Coordination normal.   Skin: Skin is warm and dry. No rash noted. She is not diaphoretic. No erythema.   Psychiatric: She has a normal mood and affect. Her behavior is normal. Judgment and thought content normal.       CT CHEST WO CONTRAST dated 08/02/2013 4:48 PM   CLINICAL HISTORY: Other - Must specify in Comments field; f/u lung  nodules;   COMPARISON: 01/24/2013 and 07/12/2012 chest CTs in the 07/27/2012 PET/CT.   TECHNIQUE: Multidetector CT imaging was obtained through the chest in the supine position without intravenous contrast. The images were reconstructed with 5 and 1 mm collimation using a 38 cm field of view.   FINDINGS:   The central airways are patent. A subsegmental branch of the medial segment right middle lobe bronchus is occluded by the right middle lobe pulmonary nodule. There is mild lower lobe bronchiectasis.   There is a lobulated nodule medially in the right middle lobe measuring 1.7 x 1.6 cm (image 206, series 5) and measures 1.7 x 1.6 cm at a similar level on the September 2014 exam (image 185, series 6). It is similar in size to the March 2014 chest CT and PET/CT although thin sections are not available from those exams. There is no definite fat attenuation or calcification within the nodule on the current exam although 2 small foci of calcification may have been present on the September 2010 exam. This nodule focally occludes an adjacent subsegmental airway.   An irregular 9 x 4 mm mixed attenuation lingular nodule (image 186, series 4) is unchanged from September 2014 and was present though poorly characterized on March 2014. This is adjacent to an accessory fissure though does not have the typical appearance of an intraparenchymal lymph node. A 3 mm  nodule laterally in the left apex (image 57, series 4) appears to minimally increased in size.   Calcified nodules and right hilar and mediastinal lymph nodes are consistent with healed granulomatous disease.   There is no suspicious lymphadenopathy. Limited images through the upper abdomen demonstrate stable low attenuation liver lesions since March 2014. The low attenuation in the left kidney adjacent to the renal pelvis is incompletely imaged. There are no suspicious osseous abnormalities.   IMPRESSION:   Stable lobulated 1.7 x 1.6 cm right middle lobe pulmonary nodule  since September 2014 and grossly unchanged from the March 2014 exams. An indolent tumor such as a carcinoid is a consideration and a nuclear medicine octreotide scan might be useful as some carcinoid tumors demonstrate uptake. A hamartoma or granuloma are additional considerations.   Mixed attenuation of lingular nodule is indeterminate with a mixed attenuation lung cancer a consideration. Continued followup is recommended. The slightly increased left apical nodule can be evaluated with followup CT as well.     Assessment:     Lung nodules    Plan:     Patient with a large nodule RML with evidence of granulomatous disease , ans small lingular nodules.    Although the PETCT was negative, there is still a consideration for slow growing tumor like carcinoid. She has diarrhea but nothing else to suggest carcinoid syndrome.    Follow up with the sleep center    Plan to repeat CT chest in 6 months.

## 2013-08-16 NOTE — Unmapped (Signed)
Joanna Lopez,  CT scan completed by [place] in FEB was not impressive from a GI standpoint.   If she has continued to have diarrhea and is losing weight she should be seen in the office.   It looks like you contacted her multiple times a few weeks ago to arrange a f/u appt but she never called back.  We don't have anyone on at 4 PM tomorrow, do you want to see if she can come in then?    Thanks,  [employer]

## 2013-08-16 NOTE — Unmapped (Signed)
Patient reports weight loss of 13 lbs in 3 weeks, nausea, diarrhea(3-4 episodes per day), and dry heaving. States that pulmonologist seen some concerning things on chest CT. States that MD said she may have been exposed to something you get from digging in the ground and that it shows first in stomach and he noticed it in left lung. Patient wants to make sure that MD never seen anything in her stomach on previous scans. Patient would like to know if MD wants to view the chest CT. Scan is in media done on 08/05/13. [place] can be reached at [phone] (H) after 4:30 today or prior to 4:30 at [phone] (W)

## 2013-08-16 NOTE — Unmapped (Signed)
appt made for tomorrow at 4pm

## 2013-08-17 ENCOUNTER — Ambulatory Visit: Admit: 2013-08-17 | Discharge: 2013-08-17 | Payer: PRIVATE HEALTH INSURANCE

## 2013-08-17 DIAGNOSIS — R197 Diarrhea, unspecified: Secondary | ICD-10-CM

## 2013-08-17 MED ORDER — nortriptyline (PAMELOR) 10 MG capsule
10 | ORAL_CAPSULE | Freq: Every evening | ORAL | 1.00 refills | 30.00000 days | Status: AC
Start: 2013-08-17 — End: 2016-11-25

## 2013-08-17 NOTE — Unmapped (Addendum)
Stop Creon and levsin.  Start Nortriptyline 10mg  every night. It's ok to increase by 10  every 3-4 days if tolerating without SE's and still having diarrhea up to 50mg .

## 2013-08-17 NOTE — Unmapped (Signed)
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   Had EGD, SBBx's, colonoscopy at [facility] and recent 9/14 colon biopsies all nondiagnostic. OSH noncontrast CT scan was normal.   Continued to have about 3-7x/day, and worse after dairy. Described foul odor to BMs and significant gassiness but not losing weight.   Also mentions that she needs to avoid beans, onions and cabbage.   On bentyl 20mg  po bid, she had some formed BMs, but still quite frequent. No response previously to probiotic.   Symptoms got worse after trip to [place] 11yrs ago.   No abdominal pains. No weight loss. Still has GB. Never smoker; modest alcohol.    fecal fat was normal but fecal elastase a bit low.   We stopped bentyl and started 2 creons during meals.   Diarrhea down to 3-5x/d, consistency of thick paste, and appetite down.  OSH CT from 3/14 which was chest CT brought by pt which showed some liver cysts and pancreatic tail looked normal but Dr. Mickie Bail didn't see much of the intestine or right side of the pancreas.  We ordered CT abd to evaluate further and recommended holding lipitor/zocor incase these were causing symptoms.   CT was non-diagnostic. Serologic testing to evaluate for neuroendocrine causes which were normal.   She felt diarrhea improved slightly with holding lipitor but was still having 3 loose stools a day (down from 5).  We discussed starting levsin bid.   Here today for f/u.   Reports improvement of diarrhea with levsin 2-3 x day in addition to the Creon and has been having 1-3 soft/loose Bm's a day.   No orange, oily, melena or bloody stools.  About a month ago began to have some nausea and decreased apeptite and weight has been dropping.   A few weeks ago began to have some nausea and then had an episode of severe abd pain left side radiating to mid abd with n/v and diarrhea when she was out of town.   Now nauseated all the time and worse after eating and gets left sided pains after eating lasting a few  minutes and then slowly resolves. Pain can occur unrelated to eating also.   She isn't sure if the levsin helps with the pain.   Bm's have no effect.  Increased amt of dry heaving.   No heartburn, regurgitation or dysphagia.  Using Creon 1-2 pills with meals which she takes during the meal, feels like it helped her bloating/gassines.  No known fevers, but tells me she gets hot flashes which isn't new for her.   Weight down 8 lbs.   Started on paxil for anxiety  2-3 weeks ago.     PMH: HTN, HL, OSA, depression.   PSH: UPPP, appy.     ROS DOCUMENT REVIEWED AND SIGNED    PE:  nad  vss  afeb  No rashes  PERRLA  Lungs CTA, even, non-labored  S1S2 present, no murmurs  abd soft, non-tender, non-distended  BS+  Ext: no edema  MS: appropriate    A/P:  63 y/o woman with diarrhea. Had EGD, SBBx's, colonoscopy at [facility] and recent 9/14 colon biopsies all nondiagnostic.   Fecal elastase was low at 181 suggesting possible EPI, so she was started on Creon, which helped partially.   We ordered CT abd which was non-diagnostic and serologic testing for neuroendocrine causes were normal.   Started on Levsin which she takes 1 tab 2-3 x day and diarrhea has improved significantly now  1-3 x day.   Over the past month has developed nausea, abd pain and now losing weight.   ?SE's from the Levsin as onset of symptoms and beginning medication correlate.   Recommended stopping the levsin and starting Nortriptyline 10mg  and will increase by 10mg  q 3-4 days if tolerating without SE's. Ok to increase to 50mg  if lower doses not controlling diarrhea. Educated about medication, rationale for use and SE's.  We also discussed stopping the Creon as her pancreas looks normal on imaging and hasn't been very helpful.   RTO 2-3 weeks.     I have interviewed and examined patient myself and agree with the evaluation per Beecher Mcardle (CNP).  Diarrhea quite a bit better on levsin but developed nausea, cramps and anorexia. Will stop levsin and try  nortriptyline (10mg  increments) for suspected spastic colon.  Will also stop creon which likely not needed here, and low fecal elastase might have been false positive. Also recommended probiotic for ongoing gassiness.  -N.Sundy Houchins

## 2014-02-07 ENCOUNTER — Inpatient Hospital Stay: Admit: 2014-02-07 | Payer: PRIVATE HEALTH INSURANCE | Attending: Pulmonary Disease

## 2014-02-07 DIAGNOSIS — R918 Other nonspecific abnormal finding of lung field: Secondary | ICD-10-CM

## 2014-02-14 ENCOUNTER — Ambulatory Visit: Admit: 2014-02-14 | Discharge: 2014-02-14 | Payer: PRIVATE HEALTH INSURANCE | Attending: Pulmonary Disease

## 2014-02-14 DIAGNOSIS — R918 Other nonspecific abnormal finding of lung field: Secondary | ICD-10-CM

## 2014-02-14 NOTE — Unmapped (Signed)
History of Present Illness:      Joanna Lopez is a 63 y.o. female.    HPI  Feeling great  No new problems  No chest pains, shortness of breath or cough    Review of Systems   All other systems reviewed and are negative.      Objective:   Blood pressure 118/72, pulse 75, height 5' 6.5 (1.689 m), weight 210 lb (95.255 kg), SpO2 96.00%.    Physical Exam    Nursing note and vitals reviewed.  Constitutional: She is oriented to person, place, and time. She appears well-developed and well-nourished. No distress.   HENT:   Head: Normocephalic and atraumatic.   Eyes: Conjunctivae and EOM are normal. Pupils are equal, round, and reactive to light. Right eye exhibits no discharge. Left eye exhibits no discharge. No scleral icterus.   Neck: Normal range of motion. Neck supple. No tracheal deviation present. No thyromegaly present.   Cardiovascular: Normal rate and regular rhythm.  Exam reveals no gallop and no friction rub.    No murmur heard.  Pulmonary/Chest: Effort normal and breath sounds normal. No respiratory distress. She has no wheezes. She has no rales.   Abdominal: Soft. Bowel sounds are normal. She exhibits no distension. There is no tenderness.   Musculoskeletal: Normal range of motion. She exhibits no edema and no tenderness.   Neurological: She is alert and oriented to person, place, and time. She has normal reflexes. She displays normal reflexes. No cranial nerve deficit. Coordination normal.   Skin: Skin is warm and dry. No rash noted. She is not diaphoretic. No erythema.   Psychiatric: She has a normal mood and affect. Her behavior is normal. Judgment and thought content normal.       CT CHEST WO CONTRAST dated 08/02/2013 4:48 PM   CLINICAL HISTORY: Other - Must specify in Comments field; f/u lung nodules;   COMPARISON: 01/24/2013 and 07/12/2012 chest CTs in the 07/27/2012 PET/CT.   TECHNIQUE: Multidetector CT imaging was obtained through the chest in the supine position without intravenous contrast.  The images were reconstructed with 5 and 1 mm collimation using a 38 cm field of view.   FINDINGS:   The central airways are patent. A subsegmental branch of the medial segment right middle lobe bronchus is occluded by the right middle lobe pulmonary nodule. There is mild lower lobe bronchiectasis.   There is a lobulated nodule medially in the right middle lobe measuring 1.7 x 1.6 cm (image 206, series 5) and measures 1.7 x 1.6 cm at a similar level on the September 2014 exam (image 185, series 6). It is similar in size to the March 2014 chest CT and PET/CT although thin sections are not available from those exams. There is no definite fat attenuation or calcification within the nodule on the current exam although 2 small foci of calcification may have been present on the September 2010 exam. This nodule focally occludes an adjacent subsegmental airway.   An irregular 9 x 4 mm mixed attenuation lingular nodule (image 186, series 4) is unchanged from September 2014 and was present though poorly characterized on March 2014. This is adjacent to an accessory fissure though does not have the typical appearance of an intraparenchymal lymph node. A 3 mm nodule laterally in the left apex (image 57, series 4) appears to minimally increased in size.   Calcified nodules and right hilar and mediastinal lymph nodes are consistent with healed granulomatous disease.   There is no suspicious  lymphadenopathy. Limited images through the upper abdomen demonstrate stable low attenuation liver lesions since March 2014. The low attenuation in the left kidney adjacent to the renal pelvis is incompletely imaged. There are no suspicious osseous abnormalities.   IMPRESSION:   Stable lobulated 1.7 x 1.6 cm right middle lobe pulmonary nodule since September 2014 and grossly unchanged from the March 2014 exams. An indolent tumor such as a carcinoid is a consideration and a nuclear medicine octreotide scan might be useful as some carcinoid  tumors demonstrate uptake. A hamartoma or granuloma are additional considerations.   Mixed attenuation of lingular nodule is indeterminate with a mixed attenuation lung cancer a consideration. Continued followup is recommended. The slightly increased left apical nodule can be evaluated with followup CT as well.       Exam: CT CHEST WO CONTRAST dated 02/07/2014 2:33 PM   CLINICAL HISTORY: Solitary pulmonary nodule;   COMPARISON: Multiple studies dating back to July 12, 2012   TECHNIQUE: Multidetector CT imaging was obtained through the chest in the supine position without intravenous contrast. The images were reconstructed with 5 and 1 mm collimation using a 38 cm field of view.   FINDINGS:   Subsegmental occlusion of the medial segment right middle lobe airway by a 15 x 17 mm nodule (series 4, image 192), and is essentially unchanged in comparison to the study performed 20 months prior. The central airways are otherwise clear.   There is an irregular, ill-defined sub-solid nodule in the superior lingular segment of the left upper lobe (series 4, image 163), which measures roughly 9 x 5 mm and is unchanged in comparison to the March 2014 study.   A 2 mm nodule in the lateral portion of the left apex (series 4, image 46), is unchanged in comparison to the March 2014 study. A small bleb posterior dependent bibasilar atelectatic changes present. Atelectasis or scarring is noted in the left lingula and medial most portions of the right middle lobe.   There is no evidence of mediastinal or hilar lymphadenopathy. Calcified precarinal and right hilar lymph nodes are again noted. There are no suspicious osteolytic or osteoblastic lesions. Limited evaluation of the upper abdomen demonstrates multiple hypodense lesions throughout the hepatic parenchyma, unchanged since March 2014.   IMPRESSION:   Unchanged pulmonary nodules dating back to March 2014, including the sub-solid nodule within the left upper lobe. Further evaluation  and followup imaging in 12 months is recommended for repeat evaluation.    Assessment:     Lung nodules    Plan:     Patient with a large nodule RML with evidence of granulomatous disease , and small lingular nodules.    Although the PETCT was negative, there is still a consideration for slow growing tumor like carcinoid.     Plan to repeat CT chest in 12 months.    Flu shot recommended every year

## 2015-02-04 ENCOUNTER — Encounter: Payer: PRIVATE HEALTH INSURANCE | Attending: Sports Medicine

## 2015-02-07 ENCOUNTER — Ambulatory Visit: Admit: 2015-02-07 | Discharge: 2015-02-07 | Payer: PRIVATE HEALTH INSURANCE | Attending: Sports Medicine

## 2015-02-07 DIAGNOSIS — M79602 Pain in left arm: Secondary | ICD-10-CM

## 2015-02-07 MED ORDER — diclofenac (VOLTAREN) 75 MG EC tablet
75 | ORAL_TABLET | Freq: Two times a day (BID) | ORAL | Status: AC
Start: 2015-02-07 — End: 2016-11-25

## 2015-02-07 NOTE — Unmapped (Signed)
Chief Complaint   Patient presents with   ??? Arm Pain     left arm pain        HPI:      Joanna Lopez is a 64 y.o. female who presents for evaluation of Left elbow pain.  The patient is seen at the request of Referral, Self.      The notes and documentation from the physician extender were reviewed, verified, and edited if necessary.       The patient comes in today for evaluation of her left elbow.  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  Following the injury, she had pain and swelling in the left elbow.  She had difficulty with motion.  She was seen and evaluated at an outside hospital.  X-rays at that time were negative for fracture.  She has continued to have pain over the radial aspect of the left elbow.  Her motion is excellent.  Good, but she does report pain going from the elbow up into her arm and down into the forearm.  No numbness or tingling.    Past Medical History   Diagnosis Date   ??? GERD (gastroesophageal reflux disease)    ??? Hypertension    ??? Hyperlipidemia    ??? IBS (irritable bowel syndrome)    ??? Basal cell cancer    ??? OSA (obstructive sleep apnea)        Past Surgical History   Procedure Laterality Date   ??? Appendectomy     ??? Uvp     ??? Submastoid gland removal     ??? Uvulopalatopharyngoplasty     ??? Tonsillectomy     ??? Colonoscopy N/A 01/23/2013     Procedure: COLONOSCOPY WITH MAC;  Surgeon: Lurene Shadow, MD;  Location: [facility] ENDOSCOPY;  Service: Gastroenterology;  Laterality: N/A;        Family History   Problem Relation Age of Onset   ??? Adopted: Yes   ??? Diabetes Son        Medication  Current Outpatient Prescriptions   Medication Sig Dispense Refill   ??? citalopram (CELEXA) 10 MG tablet Take 10 mg by mouth daily.     ??? diclofenac (VOLTAREN) 75 MG EC tablet Take 1 tablet (75 mg total) by mouth 2 times a day. 60 tablet 0   ??? esomeprazole (NEXIUM) 40 MG capsule Take 40 mg by mouth every morning before breakfast.      ??? hyoscyamine (LEVSIN) 0.125 mg tablet Take 1 tablet (0.125 mg total) by mouth 2 times a day as needed for Cramping. Indications: DIARRHEA 60 tablet 1   ??? lipase-protease-amylase (CREON) 24,000-76,000 -120,000 unit CpDR Take 2 capsules po with each meal. 180 capsule 6   ??? lisinopril (PRINIVIL,ZESTRIL) 10 MG tablet      ??? nortriptyline (PAMELOR) 10 MG capsule Take 1 capsule (10 mg total) by mouth at bedtime. 30 capsule 2   ??? PARoxetine (PAXIL) 10 MG tablet Take 10 mg by mouth every morning.     ??? roPINIRole (REQUIP) 0.25 MG tablet Take 1 tablet (0.25 mg total) by mouth daily with dinner. Take one tablet (0.25 mg) 1-2 hours before bedtime, for three days, then take 2 tablets (0.5 mg) 1-2 hours before bedtime for four days, then take 4 tablets (1 mg) 1-2 hours before bedtime thereafter 95 tablet 5   ??? UNABLE TO FIND Cholesterol medication , unknown to pt  No current facility-administered medications for this visit.       Allergies  Allergies   Allergen Reactions   ??? Formaldehyde Analogues    ??? Penicillins    ??? Sulfa (Sulfonamide Antibiotics)        Review of Systems:  The patient's medical history and review of systems was completed as part of her intake paperwork.  These forms were reviewed with the patient during the visit today, and have been scanned into electronic medical record.  Pertinent items are noted in HPI.    Physical Examination:    This is a pleasant female, alert, and in no acute distress.    Filed Vitals:    02/07/15 0915   Height: 5' 6.5 (1.689 m)   Weight: 207 lb (93.895 kg)       Psychiatric:   The patient's mood is normal and appropriate for their visit     Left elbow exam: she actually has really good range of motion of the left elbow.  She has full flexion and extension.  She still has some mild swelling.  She is tender to palpation over the radial head.  She has pain with pronation and supination, especially against resistance.    Vascular exam:   Extremities warm and well perfused, no  significant edema    Respiratory exam: Breathing easy and unlabored    Neuro exam:   No focal neuro deficits    Lymphatic:  No obvious lymphadenopathy    Skin:     Warm, dry    Radiology:     4 view X-rays of the left elbow dated 01/29/15, 3 view x-rays of the left hand dated 01/21/15, and three-view x-rays of the left wrist dated 01/21/15 were reviewed independently and discussed with the patient.  The films revealed: no evidence of fracture on any of the films    Assessment:     1. Left elbow pain     2. Left arm pain  Physical Therapy       Plan:    Joanna Lopez is a 64 y.o. female with left elbow pain.  Her exam is concerning for radial head fracture despite the negative x-rays.  Given this, I have recommended that we treat this like a radial head fracture.  She is already passed the 2 week immobilization point.  I have encouraged her to utilize an elbow pad for protection and I will get her started in physical therapy to work on some range of motion exercises.  I will get her started on Voltaren as an anti-inflammatory to help try to calm down some of the inflammation.   She is to utilize Zantac to help with some of the GI upset.  I will see her back  In 2-3 weeks with x-rays the left elbow.    Discussed the patient's diagnosis, exam, imaging (if applicable), and treatment options:  [x]   Discussed return to activity considerations, including the need to avoid exacerbating activity, high impact, or painful activity  [x]   Discussed the use of pain medication and nonsteroidal anti-inflammatory agents including the risk of side effects which include but not limited to:  the risk of increased blood pressure, bleeding, renal, gastrointestinal, and heart involvement.     [x]  Patient to consider ibuprofen or naprosyn for 1-2 weeks with food.     [x]  The patient can also consider acetaminophen for additional pain if needed.    [x]  Prescription given for pain medication or anti-inflammatory (see Epic orders)   []   May continue on their previously prescribed pain regimen   [x]   Ice 3-4 x/day for 15-20 min and after activity  []   Consider glucosamine supplementation - 1500 mg/day for 2-3 months    []   Weight management   []   Consider brace  [x]   Reviewed the role of a rehabilitative exercise program:   [x]  Patient elected for a referral to physical therapy     []  Patient elected for a home based program and instruction was given in the office today  [x]   Reviewed the role of additional diagnostic and treatment options including the role of further imaging, injection therapy, and possible surgical considerations.      Follow-up: 2-3 weeks with x-rays of left elbow.  Sooner with any problems, questions, concerns, or worsening symptoms.      [x]   A copy of my note was sent electronically to the referring provider through our shared medical record or automated fax system.

## 2015-03-04 ENCOUNTER — Encounter: Payer: PRIVATE HEALTH INSURANCE | Attending: Sports Medicine

## 2016-03-06 ENCOUNTER — Inpatient Hospital Stay: Admit: 2016-03-06 | Payer: MEDICARE

## 2016-03-06 DIAGNOSIS — Z1231 Encounter for screening mammogram for malignant neoplasm of breast: Secondary | ICD-10-CM

## 2016-03-11 ENCOUNTER — Inpatient Hospital Stay: Admit: 2016-03-11

## 2016-08-14 ENCOUNTER — Inpatient Hospital Stay: Admit: 2016-08-14 | Payer: MEDICARE

## 2016-08-14 DIAGNOSIS — R918 Other nonspecific abnormal finding of lung field: Secondary | ICD-10-CM

## 2016-08-28 ENCOUNTER — Inpatient Hospital Stay: Admit: 2016-08-28 | Payer: MEDICARE

## 2016-08-28 DIAGNOSIS — R918 Other nonspecific abnormal finding of lung field: Secondary | ICD-10-CM

## 2016-08-28 LAB — POC GLU MONITORING DEVICE: POC Glucose Monitoring Device: 102 mg/dL (ref 70–100)

## 2016-08-28 MED ORDER — F-18 FDG 16.1 millicurie
Freq: Once | Status: AC | PRN
Start: 2016-08-28 — End: 2016-08-28
  Administered 2016-08-28: 12:00:00 16.1 via INTRAVENOUS

## 2016-09-04 ENCOUNTER — Inpatient Hospital Stay: Admit: 2016-09-04 | Attending: Pulmonary Disease

## 2016-09-09 ENCOUNTER — Ambulatory Visit: Admit: 2016-09-09 | Discharge: 2016-09-09 | Payer: MEDICARE | Attending: Pulmonary Disease

## 2016-09-09 DIAGNOSIS — R918 Other nonspecific abnormal finding of lung field: Secondary | ICD-10-CM

## 2016-09-09 NOTE — Unmapped (Signed)
History of Present Illness:      Joanna Lopez is a 66 y.o. female.    HPI    I saw Joanna Lopez last time in October 2015  She had another CT chest done - was seen by her PCP who called me with results  She feels the same  No major changes since last time I saw he  Patient denies hemoptysis, increased shortness of breath, chest pains, headaches, nausea, vomiting, diarrhea  No fevers or chills. No new skin rashes    Review of Systems   Constitutional: Positive for fatigue.   HENT: Positive for postnasal drip.    Respiratory: Positive for cough, shortness of breath and wheezing.    Gastrointestinal: Positive for bloating and diarrhea.   Musculoskeletal: Positive for arthralgias.   Neurological: Positive for tremors.   Hematological: Bruises/bleeds easily.   Psychiatric/Behavioral: The patient is nervous/anxious.    All other systems reviewed and are negative.      Objective:   Blood pressure 114/84, pulse 75, height 5' 6 (1.676 m), weight 219 lb 12.8 oz (99.7 kg), SpO2 96 %.    Physical Exam    Nursing note and vitals reviewed.   Constitutional: Oriented to person, place, and time. Appears well-developed and well-nourished. No distress.   HENT: pupils equal, no scleral icterus, on conjunctivitis, no enlarged thyroid  Head: Normocephalic.    Ears: External ear normal.   Mouth/Throat: Oropharynx is clear and moist. No oropharyngeal exudate.   Eyes: Conjunctivae are normal. Pupils are equal, round, and reactive to light. Right eye exhibits no discharge. Left eye exhibits no discharge.   Neck: Normal range of motion. Neck supple. No JVD present. No tracheal deviation present. No thyromegaly present.   Cardiovascular: Normal rate, regular rhythm, normal heart sounds and intact distal pulses. Exam reveals no gallop and no friction rub.   No murmur heard.   Pulmonary/Chest: Effort normal and breath sounds normal. No stridor. No respiratory distress. No wheezes. No rales. Exhibits no tenderness.    Abdominal:Exhibits no distension and no mass. There is no tenderness. There is no rebound and no guarding.   Musculoskeletal: exhibits no edema and no tenderness.   Neurological: alert and oriented to person, place, and time. normal reflexes. No cranial nerve deficit. Coordination normal.   Skin: Warm and dry. Not diaphoretic. No erythema.   Psychiatric: Normal mood and affect. Behavior is normal. Judgment and thought content normal.       Exam: CT CHEST WO CONTRAST dated 08/14/2016 10:17 AM EDT  ??  CLINICAL HISTORY: lung nodules;   ??  COMPARISON: February 07, 2014  ??  TECHNIQUE: Multidetector  CT imaging was obtained through the chest in the supine position without intravenous contrast. The images were reconstructed with 5 and 1 mm collimation using a 38 cm field of view.  ??  FINDINGS: The lung windows show that the central airways are patent. Small calcified nodules reflect healed granulomatous infection.  ??  The well-defined noncalcified nodule in the medial segment right middle lobe measures approximately 2.1 x 1.8 cm on series 5 image 214, previously 1.7 x 1.5 cm on February 07, 2014. The central portion of this nodule is fluid attenuation.  ??  The irregular ill-defined subsolid nodule in the superior lingular subsegment of the left upper lobe on series 5 image 193 measures approximately 0.9 x 0.4 cm.  ??  A tiny subpleural nodule at the left apex laterally on series 5 image 73 is unchanged. There are no  new lesions.  ??  The mediastinal windows show granulomatous calcification but otherwise no lymphadenopathy.  ??  No coronary artery calcifications are visible. The caliber of the great vessels is normal.  ??  Limited noncontrast imaging through the upper abdomen shows multiple low-attenuation liver lesions, similar to prior studies, likely cysts. A fluid attenuation lesion centrally in the left kidney is larger compared to 2014 but likely a peripelvic cyst.  ??  The bone windows show no suspicious  lesions.  ??  ??  IMPRESSION:  ??  While predominantly fluid attenuation, the right middle lobe nodule is mildly increased in size compared to 3 years previously. This is most likely a benign lesion such as granuloma or hamartoma. The fluid attenuation makes carcinoid tumor less likely. Nevertheless, due to the interval growth a follow-up CT in 12 months should be considered.  ??  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 This can be followed at the same time.  ??  Report Verified by: Rick Duff, M.D. at 08/14/2016 12:53 PM EDT    Assessment:     Lung nodules  ??  Plan:   ??  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  ??There is still a consideration for slow growing tumor like carcinoid.   I will refer her to interventional pulmonary for biopsy - there is an airway going into the nodule  ??  Flu shot recommended every year  ??

## 2016-09-10 NOTE — Progress Notes (Signed)
Joanna Lopez was referred to Interventional Pulmonary (IP) service by Dr [name] (PCP) and Dr Damaris Hippo (Pulmonary) for bronchoscopic evaluation of an abnormal CT chest. No concerning allergies/ medications with regard to procedure planning.  --  Plan:   IP to perform Endobronchial Ultrasound bronchoscopy (EBUS)/ Navigational Bronchoscopy (Navi) on 09-17-16 at 12:30 PM.  --  Neale Burly, MSN AGACNP-BC  Acute Care Nurse Practitioner  Interventional Pulmonary (IP) service   Pulmonary, Critical Care, and Sleep Medicine  Clinic: [phone]  Desk: [phone]  Pager: [phone]   09/10/2016 11:12 AM

## 2016-09-10 NOTE — Unmapped (Signed)
Spoke with patient to schedule her for Bronchoscopy w/ EBUS/NAVI procedure.  Patient was agreeable to schedule bronchoscopy on  Thursday,  09/17/2016@12 :30 pm, w/ a 10:30 am  arrival time.      Pre-Procedure Instructions were given to patient:  -NPO after midnight  -Take inhalers/meds 2 hours prior to procedure w/ a tiny sip of water   -Have transportation to get home.     Patient verbalized understanding of instructions and had no further questions.  Patient was given my phone number if they think of any further questions.  Phone call complete.    [facility]  Interventional Pulmonary Procedure Scheduler

## 2016-09-16 NOTE — Unmapped (Signed)
Called Joanna Lopez at [phone] (home). Discussed EBUS/ NAVI procedure tomorrow/ risks of procedure/ what to expect after. Patient has follow up office visit with Dr Octavia Heir 10-08-16, but will call her sooner if findings warrant notification/ treatment. Patient appreciated call.   --  Neale Burly, MSN AGACNP-BC  Acute Care Nurse Practitioner  Interventional Pulmonary (IP) service   Pulmonary, Critical Care, and Sleep Medicine  Clinic: [phone]  Desk: [phone]  Pager: [phone]  09/16/2016 2:20 PM

## 2016-09-16 NOTE — Unmapped (Signed)
Spoke with patient to remind her of her Bronchoscopy w/ EBUS/NAVI  that is scheduled for tomorrow, Thursday, 09/17/2016@730  am, w/ a 6:30 am  arrival time.  *Patient agreed to come in earlier due to adding on a inpatient to the schedule.     Pre Procedure Instructions were given to patient.  -NPO after midnight  -take meds 2 hours prior to procedure w/ a tiny sip of water  -have transportation     Patient had a few questions regarding procedure and would like a call from the Interventional Pulmonary Nurse Practitioner, Alvera Novel Stamper.  I told patient that I would forward message on to Nurse Practitioner.      Patient verbalized understanding of instructions, and was appreciative of call.  Forwarding to CNP .

## 2016-09-17 ENCOUNTER — Ambulatory Visit: Admit: 2016-09-17 | Payer: MEDICARE

## 2016-09-17 LAB — AFB CULTURE PLUS STAIN: Culture Result: NEGATIVE

## 2016-09-17 LAB — BAL/BRUSH CULTURE PLUS STAIN, SEMIQUANT
Culture Result: <1000
Culture Result: NORMAL
Gram Stain Result: NONE SEEN

## 2016-09-17 LAB — FUNGUS CULTURE AND SMEAR: Fungus Stain: NONE SEEN

## 2016-09-17 MED ORDER — lidocaine 40 mg/mL (4 %) injection 4 mL
40 | Freq: Once | INTRAMUSCULAR | Status: AC
Start: 2016-09-17 — End: 2016-09-17
  Administered 2016-09-17: 11:00:00 4 mL via RESPIRATORY_TRACT

## 2016-09-17 MED ORDER — lactated Ringers infusion
INTRAVENOUS | Status: AC | PRN
Start: 2016-09-17 — End: 2016-09-17
  Administered 2016-09-17: 11:00:00 via INTRAVENOUS

## 2016-09-17 MED ORDER — lidocaine (XYLOCAINE) 4 % (40 mg/mL) external solution
4 | Status: AC
Start: 2016-09-17 — End: 2016-09-17

## 2016-09-17 MED ORDER — lidocaine 10 mg/mL (1 %) injection
10 | INTRAMUSCULAR | Status: AC | PRN
Start: 2016-09-17 — End: 2016-09-17
  Administered 2016-09-17: 14:00:00 20

## 2016-09-17 MED ORDER — glycopyrrolate (ROBINUL) injection
0.2 | INTRAMUSCULAR | Status: AC | PRN
Start: 2016-09-17 — End: 2016-09-17
  Administered 2016-09-17 (×2): .2 via INTRAVENOUS

## 2016-09-17 MED ORDER — fentaNYL (SUBLIMAZE) 50 mcg/mL injection
50 | INTRAMUSCULAR | Status: AC
Start: 2016-09-17 — End: 2016-09-17

## 2016-09-17 MED ORDER — phenylephrine (NEO-SYNEPHRINE) injection
10 | INTRAMUSCULAR | Status: AC | PRN
Start: 2016-09-17 — End: 2016-09-17
  Administered 2016-09-17 (×5): 100 via INTRAVENOUS

## 2016-09-17 MED ORDER — lidocaine HCl (XYLOCAINE) 2 % viscous soln Soln
2 | Status: AC
Start: 2016-09-17 — End: 2016-09-17

## 2016-09-17 MED ORDER — albuterol (PROVENTIL) nebulizer solution 2.5 mg
2.5 | Freq: Once | RESPIRATORY_TRACT | Status: AC
Start: 2016-09-17 — End: 2016-09-17

## 2016-09-17 MED ORDER — fentaNYL (SUBLIMAZE) injection
50 | INTRAMUSCULAR | Status: AC | PRN
Start: 2016-09-17 — End: 2016-09-17
  Administered 2016-09-17: 12:00:00 25 via INTRAVENOUS

## 2016-09-17 MED ORDER — ondansetron (ZOFRAN) injection
4 | INTRAMUSCULAR | Status: AC | PRN
Start: 2016-09-17 — End: 2016-09-17
  Administered 2016-09-17: 12:00:00 4 via INTRAVENOUS

## 2016-09-17 MED ORDER — midazolam (PF) (VERSED) 1 mg/mL injection
1 | INTRAMUSCULAR | Status: AC
Start: 2016-09-17 — End: 2016-09-17

## 2016-09-17 MED ORDER — propofol 10 mg/ml (DIPRIVAN) injection
10 | INTRAVENOUS | Status: AC | PRN
Start: 2016-09-17 — End: 2016-09-17
  Administered 2016-09-17: 12:00:00 200 via INTRAVENOUS

## 2016-09-17 MED ORDER — lidocaine 10 mg/mL (1 %) injection
10 | INTRAMUSCULAR | Status: AC
Start: 2016-09-17 — End: 2016-09-17

## 2016-09-17 MED ORDER — sodium chloride 0.9 % infusion
INTRAVENOUS | Status: AC
Start: 2016-09-17 — End: 2016-09-17

## 2016-09-17 MED ORDER — phenylephrine (NEO-SYNEPHRINE) 10 mg in sodium chloride 0.9 % 250 mL infusion
INTRAVENOUS | Status: AC | PRN
Start: 2016-09-17 — End: 2016-09-17
  Administered 2016-09-17: 12:00:00 20 via INTRAVENOUS

## 2016-09-17 MED ORDER — albuterol (PROVENTIL) 2.5 mg /3 mL (0.083 %) nebulizer solution
2.5 | RESPIRATORY_TRACT | Status: AC
Start: 2016-09-17 — End: 2016-09-17
  Administered 2016-09-17: 14:00:00 2.5 via RESPIRATORY_TRACT

## 2016-09-17 MED ORDER — dexamethasone (DECADRON) injection
4 | INTRAMUSCULAR | Status: AC | PRN
Start: 2016-09-17 — End: 2016-09-17
  Administered 2016-09-17: 12:00:00 12 via INTRAVENOUS

## 2016-09-17 MED ORDER — EPINEPHrine 50 MCG/ 10 ML SYRINGE FOR BRADYCARDIA
Status: AC | PRN
Start: 2016-09-17 — End: 2016-09-17
  Administered 2016-09-17 (×6): 5 via INTRAVENOUS

## 2016-09-17 MED ORDER — propofol (DIPRIVAN) infusion 10 mg/mL
10 | INTRAVENOUS | Status: AC | PRN
Start: 2016-09-17 — End: 2016-09-17
  Administered 2016-09-17: 12:00:00 150 via INTRAVENOUS

## 2016-09-17 MED FILL — LIDOCAINE HCL 4 % (40 MG/ML) MUCOSAL SOLUTION: 4 4 % (40 mg/mL) | Qty: 5

## 2016-09-17 MED FILL — MIDAZOLAM (PF) 1 MG/ML INJECTION SOLUTION: 1 1 mg/mL | INTRAMUSCULAR | Qty: 10

## 2016-09-17 MED FILL — LIDOCAINE HCL 10 MG/ML (1 %) INJECTION SOLUTION: 10 10 mg/mL (1 %) | INTRAMUSCULAR | Qty: 20

## 2016-09-17 MED FILL — ALBUTEROL SULFATE 2.5 MG/3 ML (0.083 %) SOLUTION FOR NEBULIZATION: 2.5 2.5 mg /3 mL (0.083 %) | RESPIRATORY_TRACT | Qty: 3

## 2016-09-17 MED FILL — FENTANYL (PF) 50 MCG/ML INJECTION SOLUTION: 50 50 mcg/mL | INTRAMUSCULAR | Qty: 6

## 2016-09-17 MED FILL — LIDOCAINE VISCOUS 2 % MUCOSAL SOLUTION: 2 2 % | Qty: 15

## 2016-09-17 NOTE — TOC Discharge Planning (AHS/AVS) (Signed)
Anesthesia Transfer of Care Note    Patient: Joanna Lopez  Procedure(s) Performed: Procedure(s):  BRONCHOSCOPY SUPER D WITH FLUORO AND BIOPSY-EBUS/NAVI    Patient location: Endoscopy PACU    Anesthesia type: general LMA    Airway Device on Arrival to PACU/ICU: Non-rebreather Mask    IV Access: Peripheral    Monitors Recommended to be Used During PACU/ICU: Standard Monitors    Outstanding Issues to Address: None    Level of Consciousness: awake, alert  and oriented    Post vital signs:    Vitals:    09/17/16 1000   BP: 124/70   Pulse: 66   Resp: 18   Temp: 97.6 F (36.4 C)   SpO2: 91       Complications: None      Date 09/16/16 0700 - 09/17/16 0659(Not Admitted) 09/17/16 0700 - 09/18/16 0659   Shift 0700-1459 1500-2259 2300-0659 24 Hour Total 0700-1459 1500-2259 2300-0659 24 Hour Total   I  N  T  A  K  E   I.V.     900  (9.1)   900  (9.1)      Volume (mL) (lactated Ringers infusion)     900   900    Shift Total  (mL/kg)     900  (9.1)   900  (9.1)   O  U  T  P  U  T   Shift Total  (mL/kg)           Weight (kg)     99.3 99.3 99.3 99.3

## 2016-09-17 NOTE — Unmapped (Signed)
[facility]  ENDOSCOPY INSTRUCTION/RELEASE FORMS    Date: 09/17/2016  Procedure: Procedure(s):  BRONCHOSCOPY SUPER D WITH FLUORO AND BIOPSY-EBUS/NAVI W/ UlTRATHIN SCOPE    IF YOU DEVELOP EXCESSIVE BLEEDING, UNCONTROLLED PAIN OR SHORTNESS OF BREATH, GO TO THE EMERGENCY DEPARTMENT FOR AN EXAMINATION.  IF YOU DEVELOP CHILLS, FEVER (greater than 101.5) OR HAVE CONCERNS ABOUT YOUR RECOVERY, CALL Dr. Leroy Libman AT [phone].    1. You may experience lightheadedness and nausea.  Rest today; no strenuous activity.  2. DO NOT:  ?? Smoke after bronchoscopy for 8 hours.  This will cause wheezing, shortness or breath and change of voice that will not subside.  ?? Drink alcoholic beverages today.  ?? Make serious financial or legal decisions today.  ?? Operate automobiles, heavy machinery or use any appliances today.  3. Diet:  ?? Bronchoscopy: only ice chips for the first 2 hours after the procedure then resume your diet as tolerated or recommended by your primary care provider.  4. Medications:  ?? The treatment you received today does not change your current medications.  ?? New/revised prescriptions given to patient?: No.  ?? Tylenol (acetaminophen) ONLY if directed by a physician.  5. Side Effects You May Experience:  ?? Sore throat, shortness of breath, blood tinged phlegm (less than 1 tablespoon).  ?? Slight fever.  6. Additional:  ?? Follow up with Dr. Leroy Libman at [phone] for biopsy results.  ?? Follow up for routine appointment/further testing as determined per your Dr.       All Patient Belongings Have Been Returned: _________(patient initials)  I understand and acknowledge receipt of the above instructions.                                                                                                                                          Patient or Guardian Signature                                                         Date/Time  Physician's or R.N.'s Signature                                                                  Date/Time      The discharge instructions have been reviewed with the patient and/or Guardian.  Patient and/or Guardian signed and retained a printed copy.

## 2016-09-17 NOTE — Anesthesia Post-Procedure Evaluation (Signed)
Anesthesia Post Note    Patient: Joanna Lopez    Procedure(s) Performed: Procedure(s):  BRONCHOSCOPY SUPER D WITH FLUORO AND BIOPSY-EBUS/NAVI    Anesthesia type: general LMA    Patient location: Endoscopy PACU    Post pain: Adequate analgesia    Post assessment: no apparent anesthetic complications, tolerated procedure well and no evidence of recall    Last Vitals:   Vitals:    09/17/16 1130 09/17/16 1145 09/17/16 1200 09/17/16 1215   BP: 97/59 107/73 100/62 95/61   Pulse: 84 88 88 88   Resp: 14 17 18 18    Temp:       TempSrc:       SpO2: 91% 90% (!) 88% (!) 89%   Weight:       Height:            Post vital signs: stable    Level of consciousness: awake, alert  and oriented    Complications: None

## 2016-09-17 NOTE — Unmapped (Signed)
Prior Anesthesia History & Physical / Assessment    Review of Nursing History/Assessment:  Yes    Medical and Anesthesia History:  Past Medical History:   Diagnosis Date   ??? Basal cell cancer    ??? GERD (gastroesophageal reflux disease)    ??? Hyperlipidemia    ??? Hypertension    ??? IBS (irritable bowel syndrome)    ??? OSA (obstructive sleep apnea)      Difficult intubation Unanswered   Active Problem List:  Patient Active Problem List   Diagnosis   ??? GERD (gastroesophageal reflux disease)   ??? Lung nodule, solitary   ??? Diarrhea   ??? Lung nodule     Surgical History:  Past Surgical History:   Procedure Laterality Date   ??? APPENDECTOMY     ??? COLONOSCOPY N/A 01/23/2013    Procedure: COLONOSCOPY WITH MAC;  Surgeon: Lurene Shadow, MD;  Location: [facility] ENDOSCOPY;  Service: Gastroenterology;  Laterality: N/A;   ??? submastoid gland removal     ??? TONSILLECTOMY     ??? uvp     ??? UVULOPALATOPHARYNGOPLASTY         Joanna Lopez has reviewed above and no changes  Constitutional: WDWN, Alert, NAD.    HEENT: PERLA, EOMI, no icterus, MMM, sinuses non-tender, MP IV  Neck:  Supple, no adenopathy, no JVD, no thyromegaly, trachea midline  Pulmonary:  CTAB, no crackles, rhonchi, or wheezing  Cardiovascular:  RRR, no MRG, pulses 2+, equal.  GI:  Soft, NT, ND, + BS, no HSM.  Extremities:  no clubbing, no cyanosis, no edema, no joint inflammation.  Neuro:  A&Ox3, CN grossly intact, no focal deficits.  Skin: no rashes or lesions.  Psych: Normal affect, cooperative      Airway Assessment:  within normal limits    Mallampati Classification:  IV (only hard palate visible)    ASA Classification:  ASA 2 - Patient with mild systemic disease with no functional limitations    Relevant diagnostic tests and images reviewed:   Yes    Medication Reconciliation Reviewed:  Yes     Sedation plan (type/medications): As per Anesthesia    Foye Clock, MD  Pulmonary & Critical Care Fellow   [facility]  Pgr: [phone]

## 2016-09-17 NOTE — Unmapped (Signed)
[id number]  Pulmonology  _______________________________________________________________________________  Patient Name: Joanna Lopez    Procedure Date: 09/17/2016 7:23 AM  MRN: [id number]                         Account Number: [id number]  Date of Birth: 01/16/1951              Note Status: Finalized  Attending MD: Charissa Bash , MD   _______________________________________________________________________________     Procedure:            Bronchoscopy  Indications:          Right middle lobe nodule  Providers:            Charissa Bash, MD (Doctor), Elana Alm, MD                         (Fellow), Foye Clock, MD (Fellow)  Referring MD:           Requesting Physician:   Medicines:            Lidocaine 1% applied to the tracheobronchial tree 20                         mL, Monitored Anesthesia Care  Complications:        No immediate complications  _______________________________________________________________________________  Procedure:            Pre-Anesthesia Assessment:                        - A History and Physical has been performed. Patient                         meds and allergies have been reviewed. The risks and                         benefits of the procedure and the sedation options and                         risks were discussed with the patient. All questions                         were answered and informed consent was obtained.                         Patient identification and proposed procedure were                         verified prior to the procedure by the physician, the                         nurse and the anesthetist in the procedure room. Mental                         Status Examination: alert and oriented. Airway                         Examination: normal oropharyngeal airway. Respiratory                         Examination: clear to auscultation. CV Examination:  normal. ASA Grade Assessment: II - A patient with mild                          systemic disease. After reviewing the risks and                         benefits, the patient was deemed in satisfactory                         condition to undergo the procedure. The anesthesia plan                         was to use monitored anesthesia care (MAC). Immediately                         prior to administration of medications, the patient was                         re-assessed for adequacy to receive sedatives. The                         heart rate, respiratory rate, oxygen saturations, blood                         pressure, adequacy of pulmonary ventilation, and                         response to care were monitored throughout the                         procedure. The physical status of the patient was                         re-assessed after the procedure.                        After I obtained informed consent, the scope was passed                         under direct vision. Throughout the procedure, the                         patient's blood pressure, pulse, and oxygen saturations                         were monitored continuously. The therapeutic                         bronchoscope was introduced through the mouth, via                         laryngeal mask airway and advanced to the                         tracheobronchial tree. The EBUS bronchoscope was                         introduced through the mouth, via laryngeal mask airway  and advanced to the tracheobronchial tree of both                         lungs. The PBF-190 was introduced through the and                         advanced to the. The procedure was accomplished without                         difficulty. The patient tolerated the procedure well.  Findings:       An EBUS bronchoscope was advanced through a laryngeal mask airway under        general anesthesia. A comprehensive EBUS survey of all available lymph        node stations revealed lymphadenopathy with benign sonographic features         in stations 11L (11 mm), and 11R (12 mm). EBUS-TBNA x 5 passes was done        at each of those stations in that sequence. We then withdrew the EBUS        scope and inserted a therapeutic scope into the airway. A thorough        airway exam to the first subsegmental level was unremarkable without        purulent secretions, endobronchial lesions or active bleeding. A BAL was        obtained from the right middle lobe (120 ml instilled, 30 ml of cloudy        fluid returned).       We then used a navigational system (superDimension) with a 45 degree        Edge Firm Tip catheter to reach the right middle lobe lesion seen on        chest imaging. We were successful in reaching the lesion (0.8 cm from        the target center). Adequate position was confirmed with radial EBUS        that revealed a concentric solid lesion. Multiple samples were taken        from the lesion under fluoroscopic guidance using the following tools:        cytology brush (at least 5 passes), biopsy forceps (at least 5 passes),        gen-cut tool (at least 3 passes). Adequate hemostasis was confirmed        prior to withdrawing the scope. A post procedure thoracic ultrasound        revealed normal lung sliding without evidence of pneumothorax.  Impression:           - Right middle lobe nodule                        - Multiple specimens collected.  Recommendation:       - Await test results.  Procedure Code(s):    --- Professional ---                        [phone], Bronchoscopy, rigid or flexible, including                         fluoroscopic guidance, when performed; with  endobronchial ultrasound (EBUS) guided transtracheal                         and/or transbronchial sampling (eg,                         aspiration[s]/biopsy[ies]), one or two mediastinal                         and/or hilar lymph node stations or structures                        31629, Bronchoscopy, rigid or flexible, including                          fluoroscopic guidance, when performed; with                         transbronchial needle aspiration biopsy(s), trachea,                         main stem and/or lobar bronchus(i)                        31628, GC, Bronchoscopy, rigid or flexible, including                         fluoroscopic guidance, when performed; with                         transbronchial lung biopsy(s), single lobe                        31624, GC, Bronchoscopy, rigid or flexible, including                         fluoroscopic guidance, when performed; with bronchial                         alveolar lavage                        31623, GC, Bronchoscopy, rigid or flexible, including                         fluoroscopic guidance, when performed; with brushing or                         protected brushings                        31627, GC, Bronchoscopy, rigid or flexible, including                         fluoroscopic guidance, when performed; with                         computer-assisted, image-guided navigation (List                         separately in addition to code for primary procedure[s])  44010, GC, Bronchoscopy, rigid or flexible, including                         fluoroscopic guidance, when performed; with                         transendoscopic endobronchial ultrasound (EBUS) during                         bronchoscopic diagnostic or therapeutic intervention(s)                         for peripheral lesion(s) (List separately in addition                         to code for primary procedure[s])                        27253, GC, Ultrasound, chest (includes mediastinum),                         real time with image documentation  Diagnosis Code(s):    --- Professional ---                        R91.1, Solitary pulmonary nodule    CPT copyright 2016 [employer]. All rights reserved.    The codes documented in this report are preliminary and upon coder review may   be revised to meet  current compliance requirements.  Attending Participation:       I was present for the entire viewing, including introduction and removal        of the scope.    Charissa Bash, MD  ___________________________  Charissa Bash, MD  09/17/2016 2:32:58 PM  This report has been signed electronically.Charissa Bash , MD    __________________  Foye Clock, MD    ______________  Elana Alm, MD  Number of Addenda: 0    Note Initiated On: 09/17/2016 7:23 AM  Scope In: 7:43:12 AM  Scope Out: 9:39:02 AM

## 2016-09-17 NOTE — Unmapped (Signed)
[facility]  DEPARTMENT OF ANESTHESIOLOGY  PRE-PROCEDURAL EVALUATION    Joanna Lopez is a 66 y.o. year old female presenting for:    Procedure(s):  BRONCHOSCOPY SUPER D WITH FLUORO AND BIOPSY-EBUS/NAVI W/ UlTRATHIN SCOPE    Surgeon:   Johnny Bridge, MD    Chief Complaint     Lung nodule [R91.1]    Review of Systems     Anesthesia Evaluation    Patient summary reviewed and nursing notes reviewed.  All other systems reviewed and are negative.     I have reviewed the History and Physical Exam, any relevant changes are noted in the anesthesia pre-operative evaluation.      Cardiovascular:    Exercise tolerance: good  Duke Met score: 4 - Raking leaves. Weeding or pushing a power mower.  (+) hyperlipidemia.  Hypertension (took lisinopril today) is well controlled.    (-) past MI, angina, CHF.    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    (-) seizures, neuromuscular disease, CVA, headaches.     Pulmonary:    Sleep apnea.    (-) COPD, asthma, shortness of breath, recent URI.   ROS comment: Chronic cough, Lung nodule noted on CT      GI/Hepatic/Renal:    GERD is well controlled.    (-) liver disease, renal disease.    Comments: Irritable bowel syndrome    Endo/Other:        (-) diabetes mellitus, hypothyroidism, hyperthyroidism, no bleeding disorder. No opiate use      Past Medical History     Past Medical History:   Diagnosis Date   ??? Basal cell cancer    ??? GERD (gastroesophageal reflux disease)    ??? Hyperlipidemia    ??? Hypertension    ??? IBS (irritable bowel syndrome)    ??? OSA (obstructive sleep apnea)        Past Surgical History     Past Surgical History:   Procedure Laterality Date   ??? APPENDECTOMY     ??? COLONOSCOPY N/A 01/23/2013    Procedure: COLONOSCOPY WITH MAC;  Surgeon: Lurene Shadow, MD;  Location: [facility] ENDOSCOPY;  Service: Gastroenterology;  Laterality: N/A;   ??? submastoid gland removal     ??? TONSILLECTOMY     ??? uvp     ??? UVULOPALATOPHARYNGOPLASTY          Family History     Family History   Problem Relation Age of Onset   ??? Adopted: Yes   ??? Diabetes Son        Social History     Social History     Social History   ??? Marital status: Married     Spouse name: N/A   ??? Number of children: N/A   ??? Years of education: N/A     Occupational History   ??? Not on file.     Social History Main Topics   ??? Smoking status: Never Smoker   ??? Smokeless tobacco: Never Used   ??? Alcohol use Yes      Comment: 2-3 wine/week   ??? Drug use: No   ??? Sexual activity: Not on file     Other Topics Concern   ??? Caffeine Use No     1 can coke/day   ??? 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 Exercise No   ??? Seat Belt Yes     Social History Narrative   ??? No  narrative on file       Medications     Allergies:  Allergies   Allergen Reactions   ??? Formaldehyde Analogues    ??? Penicillins    ??? Sulfa (Sulfonamide Antibiotics)        Home Meds:  Prior to Admission medications as of 09/17/16 0649   Medication Sig Taking?   atorvastatin (LIPITOR) 10 MG tablet     citalopram (CELEXA) 10 MG tablet Take 10 mg by mouth daily.    diclofenac (VOLTAREN) 75 MG EC tablet Take 1 tablet (75 mg total) by mouth 2 times a day.    dicyclomine (BENTYL) 20 mg tablet Take 20 mg by mouth 4 times a day with meals and at bedtime.    esomeprazole (NEXIUM) 40 MG capsule Take 40 mg by mouth every morning before breakfast.    hydroCHLOROthiazide (HYDRODIURIL) 25 MG tablet     hyoscyamine (LEVSIN) 0.125 mg tablet Take 1 tablet (0.125 mg total) by mouth 2 times a day as needed for Cramping. Indications: DIARRHEA    lipase-protease-amylase (CREON) 24,000-76,000 -120,000 unit CpDR Take 2 capsules po with each meal.    lisinopril (PRINIVIL,ZESTRIL) 10 MG tablet     multivitamin (MULTIVITAMIN) tablet Take 1 tablet by mouth daily.    nortriptyline (PAMELOR) 10 MG capsule Take 1 capsule (10 mg total) by mouth at bedtime.    PARoxetine (PAXIL) 10 MG tablet Take 10 mg by mouth every morning.    roPINIRole (REQUIP) 0.25 MG  tablet Take 1 tablet (0.25 mg total) by mouth daily with dinner. Take one tablet (0.25 mg) 1-2 hours before bedtime, for three days, then take 2 tablets (0.5 mg) 1-2 hours before bedtime for four days, then take 4 tablets (1 mg) 1-2 hours before bedtime thereafter    UNABLE TO FIND Cholesterol medication , unknown to pt        Inpatient Meds:  Scheduled:   Continuous:     PRN:     Vital Signs     Wt Readings from Last 3 Encounters:   09/09/16 219 lb 12.8 oz (99.7 kg)   02/07/15 207 lb (93.9 kg)   02/14/14 210 lb (95.3 kg)     Ht Readings from Last 3 Encounters:   09/09/16 5' 6 (1.676 m)   02/07/15 5' 6.5 (1.689 m)   02/14/14 5' 6.5 (1.689 m)     Temp Readings from Last 3 Encounters:   01/23/13 98 ??F (36.7 ??C)     BP Readings from Last 3 Encounters:   09/09/16 114/84   02/14/14 118/72   08/17/13 142/90     Pulse Readings from Last 3 Encounters:   09/09/16 75   02/14/14 75   08/17/13 72     @LASTSAO2 (3)@    Physical Exam     Airway:     Mallampati: III  Mouth Opening: >2 FB  TM distance: > = 3 FB  Neck ROM: full  (-) no facial hair, neck not short, not intubated      Dental:   - No obvious cracked, loose, chipped, or missing teeth.     Pulmonary:       Breath sounds clear to auscultation.       Cardiovascular:     Rhythm: regular  Rate: normal    Neuro/Musculoskeletal/Psych:    Mental status: alert and oriented to person, place and time.          Abdominal:     Obese.  Abdomen: soft.  Current OB Status:       Other Findings:        Laboratory Data     No results found for: WBC, HGB, HCT, MCV, PLT    No results found for: ABORH    No results found for: GLUCOSE, BUN, CO2, CREATININE, K, NA, CL, CALCIUM, ALBUMIN, PROT, ALKPHOS, ALT, AST, BILITOT    No results found for: PTT, INR    No results found for: PREGTESTUR, PREGSERUM, HCG, HCGQUANT    Anesthesia Plan     ASA 3       Female and current non-smoker  Planned PONV prophylaxis.    Anesthesia Type:  general LMA.         Intravenous induction.    Anesthetic plan and  risks discussed with patient.    Plan, alternatives, and risks of anesthesia, including death, have been explained to and discussed with the patient/legal guardian.  By my assessment, the patient/legal guardian understands and agrees.  Scenario presented in detail.  Questions answered.    Use of blood products discussed with patient whom consented to blood products.   Plan discussed with CRNA and attending.

## 2016-09-24 NOTE — Unmapped (Signed)
Pt called In requesting to review her results from procedure last Thursday . Please advise

## 2016-09-24 NOTE — Unmapped (Signed)
Routing to Dr. Leroy Libman for review .

## 2016-09-25 NOTE — Telephone Encounter (Signed)
Physician spoke with pt  Also instructed to cancel appt on 31st

## 2016-09-25 NOTE — Telephone Encounter (Signed)
Pt called and said that she is still waiting on the results from her biopsy with Dr. Leroy Libman and she is requesting to know if Dr. Damaris Hippo can get the results for her because she states that this is disrupting her life waiting for Dr. Leroy Libman to call her. She states that this is urgent and she needs a call as soon as possible. Please advise. Pt can be reahced at [phone].

## 2016-09-25 NOTE — Telephone Encounter (Signed)
Message routed to physician

## 2016-09-30 NOTE — Unmapped (Signed)
Joanna Lopez was referred to Interventional Pulmonary (IP) service by Dr Damaris Hippo for bronchoscopic evaluation of pulmonary nodule. Patient underwent Endobronchial Ultrasound bronchoscopy (EBUS) Navigational Bronchoscopy (Navi) on 09-17-16. Benign lymphadenopathy was identified (11L, 11R) and non-diagnostic pathology samples were taken from the right middle lobe lesion and a non-diagnostic Broncheoalveolar Lavage (BAL) from the right lower lobe.   Dr Damaris Hippo had a follow up office visit 10-08-16, but he contacted her with results by telephone and cancelled the follow up.   --  Dr Leroy Libman assessment/ differential, ... results were non diagnostic... get PFTs and send the patient for RML lobectomy. The nodule is growing and does not make sense to continue getting CT chests. 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 PFTs 10-15-16.  ?? Follow-up with Thoracic Surgery for evaluation of lung nodule: patient can call [phone] for an office visit. Called patient. Reviewed results and plan again. Answered questions--pt wondered if LUL GGO would be surgically removed or just RML lesion. We discussed that we recommend RML nodule be evaluated for removal and that the unchanged LUL nodule should be evaluated in the future with CT as it hasn't changed over time. She took the number and appreciated the call.  ?? Update Dr Edwena Bunde team.  --  Neale Burly, MSN AGACNP-BC  Acute Care Nurse Practitioner  Interventional Pulmonary (IP) service   Pulmonary, Critical Care, and Sleep Medicine  Clinic: [phone]  Desk: [phone]  Pager: [phone]  09/30/2016 4:47 PM

## 2016-10-08 ENCOUNTER — Encounter: Payer: MEDICARE | Attending: Pulmonary Disease

## 2016-10-15 ENCOUNTER — Inpatient Hospital Stay: Admit: 2016-10-15 | Payer: MEDICARE | Attending: Pulmonary Disease

## 2016-10-15 DIAGNOSIS — R911 Solitary pulmonary nodule: Secondary | ICD-10-CM

## 2016-10-15 LAB — PFT13-PULMONARY FUNCTION TEST
DLCO%: 73
DLCO: 17.52
FEV1%: 113
FEV1/FVC EXP: 77
FEV1/FVC: 82
FEV1: 2.92
FVC%: 106
FVC: 3.58
RESPONSE TO BRONCHODILATOR: -1
RV%: 97
RV: 2.04
TLC%: 105
TLC: 5.74
VC%: 110
VC: 3.7

## 2016-10-15 MED ORDER — albuterol (PROVENTIL) nebulizer solution 2.5 mg
2.5 | Freq: Once | RESPIRATORY_TRACT | Status: AC
Start: 2016-10-15 — End: 2016-10-15
  Administered 2016-10-15: 16:00:00 2.5 mg via RESPIRATORY_TRACT

## 2016-10-15 MED FILL — ALBUTEROL SULFATE 2.5 MG/3 ML (0.083 %) SOLUTION FOR NEBULIZATION: 2.5 2.5 mg /3 mL (0.083 %) | RESPIRATORY_TRACT | Qty: 3

## 2016-10-15 NOTE — Procedures (Signed)
PULMONARY FUNCTION TEST INTERPRETATION    Respiratory therapy comments: The patient's efforts were   maximal and consistent. Results were reproducible.     Spirometric flow volume loop: appears normal.    Spirometry:  There is no demonstrable obstructive defect.  There is no significant bronchodilator response.    Lung Volumes:  Neither hyperinflation nor air trapping is present.    Diffusion Capacity for Carbon Monoxide:  Diffusing capacity is mildly decreased.  This was not corrected for hemoglobin.    Additional comments:   No previous PFTs available for comparison.    Interpreting Physician: Trenda Moots, MD  Date: 10/15/2016

## 2016-10-16 NOTE — Unmapped (Signed)
Placed call to pt per Dr. Damaris Hippo   Left voicemail with result message belwo  Gave call back number for questions    Please let her know that PFts look ok

## 2016-10-22 ENCOUNTER — Ambulatory Visit: Admit: 2016-10-22 | Discharge: 2016-10-22 | Payer: MEDICARE

## 2016-10-22 DIAGNOSIS — R911 Solitary pulmonary nodule: Secondary | ICD-10-CM

## 2016-10-22 NOTE — Unmapped (Signed)
Surgery Instructions    Your surgery has been scheduled for: 11-23-2016    You will need to arrive at the appropriate facility no later than: 5:30 AM    You will report to:     [facility]nd [facility] at [facility]      Pre-Admission Testing:   You will need pre-admission testing no more than 30 days prior to your surgery with:   Date/Time: _____________________    [facility] Pre-Admission Testing      [phone]      Please follow the instructions below:  ??? Nothing to eat or drink after midnight the night before (including gum, mints, water, etc.)    ??? If you take Aspirin, Ibuprofen, Aleve, Motrin, Celebrex, Naprosyn (Naproxen), Advil, Plavix, Coumadin (Warfarin), or any other type of blood thinner or anti-inflammatory medication, please discontinue using ___ days prior to your surgery. Please inform the prescribing doctor that you are doing so.    ??? Please stop all nutritional and dietary supplements 14 days prior    ??? Take any other medication with a small sip of water the morning of the surgery. DO NOT take AM insulin or hypoglycemic medications.    ??? Please make transportation arrangements and bring a responsible adult to accompany you home.    ??? Wear casual, loose fitting and comfortable clothing. A gown will be provided. Please remove all make-up, jewelry, and nail polish. Do not bring anything valuable to the hospital.    ??? Additional Instructions:    If you need to cancel or reschedule your surgery, please call our office as soon as possible     [phone]  [facility]   Clinical Coordinator

## 2016-10-22 NOTE — Unmapped (Signed)
HPI:  Joanna Lopez is a 66y/o female who was referred for evaluation of an enlarging right middle lobe nodule. She has been followed for pulmonary nodules and her most recent chest CT 08/14/16 demonstrated a 2.1cm right middle lobe nodule, increased from 1.7cm in 2015 and a stable 9mm lingular nodule. CTPET demonstrated low grade uptake in the right middle lobe nodule with an SUV of 1.7. The lingular nodule had an SUV of 1.4. PFTs revealed an FEV1 of 110% and DLCO of 73% predicted. Bronchoscopy was negative in levels 11R and 11L and showed inflammatory changes in the biopsy of the nodule.     She denies dyspnea and can walk 2 blocks. She denies chest pain or weight loss. She has a cough which has recently improved.     Performance Status:Normal activity - asymptomatic       Past Medical History:   Diagnosis Date   ??? Basal cell cancer    ??? GERD (gastroesophageal reflux disease)    ??? Hyperlipidemia    ??? Hypertension    ??? IBS (irritable bowel syndrome)    ??? OSA (obstructive sleep apnea)      Past Surgical History:   Procedure Laterality Date   ??? APPENDECTOMY     ??? COLONOSCOPY N/A 01/23/2013    Procedure: COLONOSCOPY WITH MAC;  Surgeon: Lurene Shadow, MD;  Location: [facility] ENDOSCOPY;  Service: Gastroenterology;  Laterality: N/A;   ??? FLEXIBLE BRONCHOSCOPY W/ UPPER ENDOSCOPY N/A 09/17/2016    Procedure: BRONCHOSCOPY SUPER D WITH FLUORO AND BIOPSY-EBUS/NAVI;  Surgeon: Johnny Bridge, MD;  Location: [facility] ENDOSCOPY;  Service: Pulmonary;  Laterality: N/A;   ??? submastoid gland removal     ??? TONSILLECTOMY     ??? UVULOPALATOPHARYNGOPLASTY       Family History   Problem Relation Age of Onset   ??? Adopted: Yes   ??? Diabetes Son      Social History   Substance Use Topics   ??? Smoking status: Never Smoker   ??? Smokeless tobacco: Never Used   ??? Alcohol use Yes      Comment: 2-3 wine/week     Allergies   Allergen Reactions   ??? Formaldehyde Analogues    ??? Penicillins    ??? Sulfa (Sulfonamide Antibiotics)      Current Outpatient  Prescriptions   Medication Sig   ??? atorvastatin    ??? citalopram Take 10 mg by mouth daily.   ??? diclofenac Take 1 tablet (75 mg total) by mouth 2 times a day.   ??? dicyclomine Take 20 mg by mouth 4 times a day with meals and at bedtime.   ??? esomeprazole Take 40 mg by mouth every morning before breakfast.   ??? hydroCHLOROthiazide    ??? hyoscyamine Take 1 tablet (0.125 mg total) by mouth 2 times a day as needed for Cramping. Indications: DIARRHEA   ??? lipase-protease-amylase Take 2 capsules po with each meal.   ??? lisinopril    ??? multivitamin Take 1 tablet by mouth daily.   ??? nortriptyline Take 1 capsule (10 mg total) by mouth at bedtime.   ??? PARoxetine Take 10 mg by mouth every morning.   ??? roPINIRole Take 1 tablet (0.25 mg total) by mouth daily with dinner. Take one tablet (0.25 mg) 1-2 hours before bedtime, for three days, then take 2 tablets (0.5 mg) 1-2 hours before bedtime for four days, then take 4 tablets (1 mg) 1-2 hours before bedtime thereafter   ??? UNABLE TO FIND Cholesterol medication , unknown  to pt     No current facility-administered medications for this visit.        ROS:   Review of Systems   Constitutional: Positive for fatigue. Negative for chills, fever, weight gain and weight loss.   HENT: Negative for congestion and sinus pressure.    Eyes: Negative for redness and itching.   Respiratory: Positive for cough and shortness of breath. Negative for chest tightness.    Cardiovascular: Negative for chest pain, palpitations and leg swelling.   Gastrointestinal: Negative for abdominal pain, constipation, diarrhea, nausea and vomiting.   Genitourinary: Negative for dysuria.   Musculoskeletal: Negative for back pain and joint swelling.   Skin: Negative for rash.   Neurological: Positive for headaches. Negative for dizziness and light-headedness.   Hematological: Does not bruise/bleed easily.   Psychiatric/Behavioral: Negative for confusion. The patient is not nervous/anxious.    I have reviewed the ROS and agree  with the findings documented above.     BP 114/84    Pulse 80    Ht 5' 6 (1.676 m)    Wt 216 lb (98 kg)    SpO2 97%    BMI 34.86 kg/m??     Objective:   Physical Exam   Constitutional: She is oriented to person, place, and time. She appears well-developed and well-nourished. No distress.   HENT:   Head: Normocephalic and atraumatic.   Eyes: Right eye exhibits no discharge. Left eye exhibits no discharge. No scleral icterus.   Neck: Neck supple.   Cardiovascular: Normal rate and regular rhythm.    Pulmonary/Chest: Effort normal and breath sounds normal. No respiratory distress. She has no wheezes. She has no rales.   Abdominal: Soft. She exhibits no distension. There is no tenderness.   Musculoskeletal: Normal range of motion. She exhibits no edema.   Lymphadenopathy:     She has no cervical adenopathy.   Neurological: She is alert and oriented to person, place, and time.   Skin: Skin is warm and dry. She is not diaphoretic.   Psychiatric: She has a normal mood and affect. Her behavior is normal.          Imaging:   CT CHEST WO CONTRAST dated 08/14/2016 10:17 AM EDT  ??  ??  IMPRESSION:  ??  While predominantly fluid attenuation, the right middle lobe nodule is mildly increased in size compared to 3 years previously. This is most likely a benign lesion such as granuloma or hamartoma. The fluid attenuation makes carcinoid tumor less likely. Nevertheless, due to the interval growth a follow-up CT in 12 months should be considered.  ??  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 This can be followed at the same time.  ??    NM PET CT TUMOR IMG SKULL TO MID-THIGH-PI dated 08/28/2016 7:20 AM EDT    IMPRESSION: Since PET/CT  July 27, 2012    1.  Mildly increased size and metabolic activity of right middle lobe pulmonary nodule, unchanged part solid nodule in the left upper lobe with abnormal FDG uptake, suspicious for  indolent lung malignancies.      2. No evidence of metastasis.  ??    Pathology:   EBUS 09/17/16    FINAL DIAGNOSIS:    Lung, right middle lobe nodule, transbronchial biopsy:  - Fragments of bronchial(with cartilage) and bronchiolar wall with attached  lung parenchyma showing mixed lymphohistiocytic inflammatory infiltrate  with plasma  cells and few eosinophils.  - Intra-alveolar macrophages are noted.  - Immunostains for CD68 (positive), S-100 (negative), CD1a (negative), and  HMB 45 (negative), support above interpretation.  - Negative for granulomas, atypia or malignancy.    DIAGNOSIS:  11L Lymph Node (EBUS), Fine Needle Aspiration:  - No malignant cells identified  - Lymphoid cells present      DIAGNOSIS:  11R Lymph Node (EBUS), Fine Needle Aspiration:  - No malignant cells identified  - Lymphoid cells present  Treatment:   none     Assessment/Plan:   Joanna Lopez presents for evaluation of an enlarging right middle lobe pulmonary nodule. I have reviewed her imaging and records. The nodule has enlarged over the last 2 years and may represent a carcinoid tumor versus a benign lesion such as granuloma or hamartoma. I discussed the risks (including, but not limited to, death, pneumonia, bleeding, prolonged ventilatory support, atrial fibrillation, infection, prolonged air leak), benefits and alternatives to proceeding with a right VATS, possible open, middle lobectomy versus continued surveillance. I discussed the expected postoperative course and recovery. All questions were answered and she wishes to proceed with surgery. She will be evaluated by anesthesiology in [facility] for preoperative assessment. I recommended an epidural for postoperative pain management.     She was consented for the thoracic tumor registry and lung cancer database. A copy of the consent was given to Joanna Lopez.    Cc:  [name], MD - PCP  Rhea Bleacher, MD - pulmonary

## 2016-11-10 ENCOUNTER — Ambulatory Visit: Admit: 2016-11-10 | Payer: MEDICARE | Attending: Adult Health

## 2016-11-10 DIAGNOSIS — R911 Solitary pulmonary nodule: Secondary | ICD-10-CM

## 2016-11-10 LAB — DIFFERENTIAL
Basophils Absolute: 51 /uL (ref 0–200)
Basophils Relative: 0.5 % (ref 0.0–1.0)
Eosinophils Absolute: 214 /uL (ref 15–500)
Eosinophils Relative: 2.1 % (ref 0.0–8.0)
Lymphocytes Absolute: 3284 /uL (ref 850–3900)
Lymphocytes Relative: 32.2 % (ref 15.0–45.0)
Monocytes Absolute: 1010 /uL (ref 200–950)
Monocytes Relative: 9.9 % (ref 0.0–12.0)
Neutrophils Absolute: 5641 /uL (ref 1500–7800)
Neutrophils Relative: 55.3 % (ref 40.0–80.0)
nRBC: 0 /100 WBC (ref 0–0)

## 2016-11-10 LAB — ANTIBODY SCREEN: Antibody Screen: NEGATIVE

## 2016-11-10 LAB — RENAL FUNCTION PANEL W/EGFR
Albumin: 4.1 g/dL (ref 3.5–5.7)
Anion Gap: 8 mmol/L (ref 3–16)
BUN: 20 mg/dL (ref 7–25)
CO2: 27 mmol/L (ref 21–33)
Calcium: 9.4 mg/dL (ref 8.6–10.3)
Chloride: 108 mmol/L (ref 98–110)
Creatinine: 0.97 mg/dL (ref 0.60–1.30)
Glucose: 88 mg/dL (ref 70–100)
Osmolality, Calculated: 298 mOsm/kg (ref 278–305)
Phosphorus: 3.8 mg/dL (ref 2.1–4.5)
Potassium: 4 mmol/L (ref 3.5–5.3)
Sodium: 143 mmol/L (ref 133–146)
eGFR AA CKD-EPI: 71 See note.
eGFR NONAA CKD-EPI: 61 See note.

## 2016-11-10 LAB — CBC
Hematocrit: 40.1 % (ref 35.0–45.0)
Hemoglobin: 14 g/dL (ref 11.7–15.5)
MCH: 32.1 pg (ref 27.0–33.0)
MCHC: 35 g/dL (ref 32.0–36.0)
MCV: 91.6 fL (ref 80.0–100.0)
MPV: 9 fL (ref 7.5–11.5)
Platelets: 181 10*3/uL (ref 140–400)
RBC: 4.38 10*6/uL (ref 3.80–5.10)
RDW: 13.5 % (ref 11.0–15.0)
WBC: 10.2 10*3/uL (ref 3.8–10.8)

## 2016-11-10 LAB — HEPATIC FUNCTION PANEL
ALT: 22 U/L (ref 7–52)
AST: 14 U/L (ref 13–39)
Albumin: 4.1 g/dL (ref 3.5–5.7)
Alkaline Phosphatase: 63 U/L (ref 36–125)
Bilirubin, Direct: 0.08 mg/dL (ref 0.00–0.40)
Bilirubin, Indirect: 0.42 mg/dL (ref 0.00–1.10)
Total Bilirubin: 0.5 mg/dL (ref 0.0–1.5)
Total Protein: 6.6 g/dL (ref 6.4–8.9)

## 2016-11-10 LAB — ABO/RH: Rh Type: POSITIVE

## 2016-11-10 NOTE — Unmapped (Signed)
Yes, please let her know she can get her pneumonia shot, but she should not get it 48 hours prior to surgery. ??Some time next week would be ok   thanks (Routing comment)

## 2016-11-10 NOTE — Unmapped (Addendum)
Pre-Procedure Instructions    We???re pleased that you have chosen [facility] for your upcoming procedure.  The staff serving you is professionally trained to provide the highest quality care.  We encourage you to ask questions and to let the staff know your special needs.  We want your visit to be as comfortable as possible.    Your procedure is scheduled on 11/23/16 at 7:30 AM.  Please arrive at 5:30 AM at: [facility] on the second floor of the main hospital     ??? DO NOT EAT OR DRINK ANYTHING (including gum, mints, water, etc.) after midnight the night before your procedure.  You may brush your teeth and gargle on the morning of surgery, but do not swallow any water.  You may take the following medications with a small sip of water:  Citalopram, esomeprazole, paroxetine  ??? Please bring CPAP on the morning of surgery  ??? Please make transportation arrangements and bring a responsible adult to accompany you home and remain with you for 24 hours.  ??? Leave valuables (money, jewelry, credit cards) at home.  If you wear glasses or contacts, bring a case for safekeeping.  ??? Wear casual, loose fitting, and comfortable clothing.  A gown will be provided.  If you are staying overnight, bring a small overnight bag.  (Storage space is limited.)  ??? Please remove all makeup, jewelry, body piercings, powder, perfume, and nail polish before you arrive.  ??? Bring a list of your medications and dose including herbal and over-the-counter medications.  Do not bring any pills or medications to the hospital. (Exception: transplant patients.)  ??? Bring a photo ID and your insurance card so we can bill your insurance company directly.  ??? Please do not bring any children under the age of 54 to the hospital.  ??? Do not shave in the area of the surgery for 2 days prior to surgery.  If needed, a trained staff member will clip the area immediately before your surgery.  ??? Quit smoking as far in advance of surgery as possible.   Patients who quit at least 30 days before surgery may have better outcomes.  ??? Discuss discontinuing herbal medications with your doctor before surgery.  ??? Please shower at home the evening before and the morning of surgery using an antiseptic agent, if provided, or soap and water.  ??? If you suspect that you have an infection prior to surgery, contact your doctor and surgeon prior to surgery.  Also tell the pre-surgery nurse on the day of surgery.    Additional instructions:Stop Vitamins, Supplements, Ibuprofen, Advil, diclofenac,  Aleve, Aspirin 1 week prior to surgery. May take Tylenol for pain      Contact information:  [facility], Monday - Friday 8:30 am - 5:00 pm   [phone]  [facility], - Friday 8:30 am - 5:00 pm   [phone]

## 2016-11-10 NOTE — Unmapped (Signed)
PRE-OPERATIVE HISTORY AND PHYSICAL       Subjective:        [facility] NP / PA:   Armstead Peaks, CNP    Date of Surgery:  11/23/16  Surgeon:  Dr. Harriette Bouillon  Diagnosis:  Lung nodule  Procedure:  (R) sided VATS middle lobe lobectomy, possible thoracotomy     Patient ID: Joanna Lopez is a 66 y.o. female.    Patient is being seen today at the request of Dr. Harriette Bouillon to render an opinion on perioperative risk optimization and to coordinate medical care as necessary prior to the following procedure:  (R) sided VATS middle lobe lobectomy, possible thoracotomy.    Chief Complaint   Patient presents with   ??? Pre-op Exam     Lung nodule       History of Present Illness:  66 yo woman with PMH of OSA, HTN, HLD and GERD with lung nodule. She is to undergo (R) sided VATS middle lobe lobectomy, possible thoracotomy and is seen today in pre-op.  She has been followed for pulmonary nodules and her most recent chest CT 08/14/16 demonstrated a 2.1cm right middle lobe nodule, increased from 1.7cm in 2015 and a stable 9mm lingular nodule. These were found incidentally on a CT for GI issues.  PFTs revealed an FEV1 of 110% and DLCO of 73% predicted. Bronchoscopy was negative in levels 11R and 11L and showed inflammatory changes in the biopsy of the nodule. She reports occasional nasty cough, typically in the morning. It is yellowish in quality typically, was green earlier this year. Denies hemoptysis, SOB, DOE. Denies fevers/chills, N/V, and weight loss. She reports she is very anxious about this surgery.     Chronic Medical Conditions, Severity, Optimization:  See below    Duke Activity Scale:  4 - Raking leaves; weeding or pushing a power mower.    Medical History:     Past Medical History:   Diagnosis Date   ??? Basal cell cancer    ??? GERD (gastroesophageal reflux disease)    ??? Hyperlipidemia    ??? Hypertension    ??? IBS (irritable bowel syndrome)    ??? OSA (obstructive sleep apnea)        Surgical History:     Past Surgical History:    Procedure Laterality Date   ??? APPENDECTOMY     ??? COLONOSCOPY N/A 01/23/2013    Procedure: COLONOSCOPY WITH MAC;  Surgeon: Lurene Shadow, MD;  Location: [facility] ENDOSCOPY;  Service: Gastroenterology;  Laterality: N/A;   ??? FLEXIBLE BRONCHOSCOPY W/ UPPER ENDOSCOPY N/A 09/17/2016    Procedure: BRONCHOSCOPY SUPER D WITH FLUORO AND BIOPSY-EBUS/NAVI;  Surgeon: Johnny Bridge, MD;  Location: [facility] ENDOSCOPY;  Service: Pulmonary;  Laterality: N/A;   ??? submastoid gland removal     ??? TONSILLECTOMY     ??? UVULOPALATOPHARYNGOPLASTY         Family History:     Family History   Problem Relation Age of Onset   ??? Adopted: Yes   ??? Diabetes Son        Social History:     Social History     Social History   ??? Marital status: Married     Spouse name: N/A   ??? Number of children: N/A   ??? Years of education: N/A     Occupational History   ??? Not on file.     Social History Main Topics   ??? Smoking status: Never Smoker   ??? Smokeless tobacco: Never Used   ???  Alcohol use Yes      Comment: 2-3 wine/week   ??? Drug use: No   ??? Sexual activity: Not on file     Other Topics Concern   ??? Caffeine Use No     1 can coke/day   ??? 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 Exercise No   ??? Seat Belt Yes     Social History Narrative   ??? No narrative on file       Allergies:     Allergies   Allergen Reactions   ??? Formaldehyde Analogues    ??? Penicillins    ??? Sulfa (Sulfonamide Antibiotics)        Medications:     Prior to Admission medications taking for visit date 11/10/16   Medication Sig Taking? Authorizing Provider   atorvastatin (LIPITOR) 10 MG tablet   Historical Provider, MD   citalopram (CELEXA) 10 MG tablet Take 10 mg by mouth daily.  Historical Provider, MD   diclofenac (VOLTAREN) 75 MG EC tablet Take 1 tablet (75 mg total) by mouth 2 times a day.  Laverle Patter, MD   dicyclomine (BENTYL) 20 mg tablet Take 20 mg by mouth 4 times a day with meals and at bedtime.  Historical Provider, MD   esomeprazole (NEXIUM) 40 MG capsule Take 40  mg by mouth every morning before breakfast.  Historical Provider, MD   hydroCHLOROthiazide (HYDRODIURIL) 25 MG tablet   Historical Provider, MD   hyoscyamine (LEVSIN) 0.125 mg tablet Take 1 tablet (0.125 mg total) by mouth 2 times a day as needed for Cramping. Indications: DIARRHEA  Elias Else, CNP   lipase-protease-amylase (CREON) 24,000-76,000 -120,000 unit CpDR Take 2 capsules po with each meal.  Elias Else, CNP   lisinopril (PRINIVIL,ZESTRIL) 10 MG tablet   Historical Provider, MD   multivitamin (MULTIVITAMIN) tablet Take 1 tablet by mouth daily.  Historical Provider, MD   nortriptyline (PAMELOR) 10 MG capsule Take 1 capsule (10 mg total) by mouth at bedtime.  Elias Else, CNP   PARoxetine (PAXIL) 10 MG tablet Take 10 mg by mouth every morning.  Historical Provider, MD   roPINIRole (REQUIP) 0.25 MG tablet Take 1 tablet (0.25 mg total) by mouth daily with dinner. Take one tablet (0.25 mg) 1-2 hours before bedtime, for three days, then take 2 tablets (0.5 mg) 1-2 hours before bedtime for four days, then take 4 tablets (1 mg) 1-2 hours before bedtime thereafter  Georgena Spurling, MD   UNABLE TO FIND Cholesterol medication , unknown to pt  Historical Provider, MD          Review of Systems   Constitutional: Positive for activity change, appetite change, fatigue and weight gain. Negative for chills, fever and weight loss.   HENT: Positive for dental problem. Negative for congestion, ear pain, hearing loss, mouth sores, postnasal drip, rhinorrhea, sinus pressure, sneezing, sore throat, tinnitus and trouble swallowing.         (R) molar- cracked from grinding teeth, lower   Eyes: Negative for pain and visual disturbance.   Respiratory: Positive for wheezing. Negative for apnea, cough, choking, chest tightness and shortness of breath.         Slight wheeze in AM , resolves with coughing  nonprod cough at this point  +OSA s/p UPP surgery  Denies orthopnea   Cardiovascular: Negative for chest  pain, palpitations and leg swelling.   Gastrointestinal: Positive for abdominal pain, bloating, constipation, diarrhea and heartburn.  Negative for blood in stool, nausea and vomiting.        Flatulence- taking Creon, diet  IBS- alternates constipation and diarrhea  Bloating/abd pain improved with Creon     Genitourinary: Positive for nocturia. Negative for decreased urine volume, difficulty urinating, dysuria, flank pain, frequency, hematuria and urgency.        On HCTZ   Musculoskeletal: Positive for back pain and neck pain. Negative for arthralgias, gait problem and neck stiffness.        Sees chiropractor with improvement, good ROM   Skin: Negative for rash and wound.   Neurological: Positive for tremors and headaches. Negative for dizziness, seizures, syncope, weakness, light-headedness and numbness.        Tremors in hands on occasion  Migraines, improved with Phenergan     Hematological: Does not bruise/bleed easily.       Objective:   Blood pressure 142/71, pulse 67, temperature 97.8 ??F (36.6 ??C), temperature source Oral, height 5' 6 (1.676 m), weight 218 lb (98.9 kg), SpO2 96 %.    Physical Exam   Vitals reviewed.  Constitutional: She is oriented to person, place, and time. She appears well-developed and well-nourished. No distress.   Body mass index is 35.19 kg/m??.  Husband present   HENT:   Head: Normocephalic and atraumatic.   Right Ear: Hearing and external ear normal.   Left Ear: Hearing and external ear normal.   Nose: Nose normal.   Mouth/Throat: Oropharynx is clear and moist and mucous membranes are normal. Abnormal dentition.   Cracked (R) lower molar,uvula surgically absent   Eyes: Conjunctivae, EOM and lids are normal. Pupils are equal, round, and reactive to light. Right conjunctiva is not injected. No scleral icterus.   glasses   Neck: Trachea normal and normal range of motion. Neck supple. Carotid bruit is not present. No tracheal deviation and normal range of motion present. No thyroid mass  and no thyromegaly present.   Pain with neck ROM   Cardiovascular: Normal rate, regular rhythm, S1 normal, S2 normal and normal heart sounds.  Exam reveals no gallop and no friction rub.    No murmur heard.  Pulses:       Radial pulses are 2+ on the right side, and 2+ on the left side.        Dorsalis pedis pulses are 2+ on the right side, and 2+ on the left side.   Pulmonary/Chest: Effort normal and breath sounds normal. No stridor. No apnea. No respiratory distress. She has no decreased breath sounds. She has no wheezes. She has no rhonchi. She has no rales. She exhibits no tenderness.   Abdominal: Soft. Bowel sounds are normal. She exhibits no distension and no mass. There is no tenderness. There is no rebound and no guarding.   Musculoskeletal: Normal range of motion. She exhibits no edema or tenderness.   Strength 5/5 BUE/BLE   Lymphadenopathy:     She has no cervical adenopathy.   Neurological: She is alert and oriented to person, place, and time. She has normal strength. She displays no tremor. No cranial nerve deficit or sensory deficit. She exhibits normal muscle tone. Coordination and gait normal.   Skin: Skin is warm and dry. No petechiae, no purpura and no rash noted. She is not diaphoretic. No cyanosis or erythema. No pallor. Nails show no clubbing.   Psychiatric: Her speech is normal and behavior is normal. Judgment and thought content normal. Her mood appears anxious. Cognition and memory are normal.  Airway:  Mallampati I (soft palate, uvula, fauces, and tonsillar pillars visible), Thyromental distance 2-3 finger breadths, opening 2-3 finger breadths. Cracked (R) lower molar. FROM of neck. Uvula surgically absent.       Lab Review:   No results found for: WBC, HGB, HCT, MCH, PLT, GLUCOSE, CREATININE, NA, K, CL, CO2, PREALBUMIN, GFRNONAFRAM, LDH, BILITOT, PROT, AST, ALT, ALKPHOS, CHOLTOT, LDL, HDL, TRIG, PROTIME      Study Results:    PFTs 10/15/16:    FEV1 2.92    FEV1% 113    FVC 3.58    FVC% 106     FEV1/FVC 82    FEV1/FVC EXP 77    RESPONSE TO BRONCHODILATOR -1% FVC, -3% FEV1    TLC 5.74    TLC% 105    RV 2.04    RV% 97    VC 3.70    VC% 110    DLCO 17.52    DLCO% 73    Narrative   Will Bonnet, MD     10/15/2016  8:01 PM  PULMONARY FUNCTION TEST INTERPRETATION    Respiratory therapy comments: The patient's efforts were   maximal and consistent. Results were reproducible.     Spirometric flow volume loop: appears normal.    Spirometry:  There is no demonstrable obstructive defect.  There is no significant bronchodilator response.    Lung Volumes:  Neither hyperinflation nor air trapping is present.    Diffusion Capacity for Carbon Monoxide:  Diffusing capacity is mildly decreased.  This was not corrected   for hemoglobin.    Additional comments:   No previous PFTs available for comparison.          Carotid duplex 2010:  Impressions    1. Right internal carotid: Normal.  2. Left internal carotid: Proximal vessel lesion: There is a 20-49%  ???? stenosis    ASA Physical Status:  3      Assessment and Recommendations:   66 yo woman with lung nodule seen preoperatively to (R) sided VATS middle lobe lobectomy, possible thoracotomy. Concurrent medical conditions include:    1. Cardiac risk/functional status: denies CAD/CHF. Denies h/o cardiac work-up. Denies CP, SOB and orthopnea. Duke activity score 4; she can walk 2 blocks, climb steps at home. She has been able to work in the yard as well. Pt endorses good functional status. Benign exam. No work-up indicated prior to OR. RCRI=1 (surgery).    2. HTN: on lisinopril and HCTZ. BP today 142/71. Hold AM of OR.     3. HLD: on statin. Continue perioperatively.     4. OSA/obesity: s/p UPPP surgery, no CPAP.  Body mass index is 35.19 kg/m??.    5. Lung nodule: to undergo (R) sided VATS middle lobe lobectomy, possible thoracotomy.     6. GERD: no longer on esomeprazole.     7. Migraines: last in the last couple weeks. Reports she takes Phenergan which helps but causes RLS to  increase.     8. RLS: no longer on Requip. Improved symptoms with massage.     9. Carotid stenosis: 20-49% (L) ICA stenosis on carotid duplex from 2010. Pt has been on statin and is asymptomatic. No bruit on exam today.     No issues with anesthesia 09/2016,denies past complications with anesthesia.    Today we obtained T&S, CBC w/diff, renal, hepatic per surgeon's orders.   Pre-procedural instructions given, patient verbalized understanding.    I have conveyed my assessment and recommendations to the referring provider via  shared EMR.    Armstead Peaks, CNP

## 2016-11-10 NOTE — Unmapped (Signed)
Patient called and wanted to know if she could get her pneumonia shot prior to her 11/23/16 surgery.

## 2016-11-20 MED FILL — VANCOMYCIN 1,000 MG INTRAVENOUS INJECTION: 1000 1000 mg | INTRAVENOUS | Qty: 2000

## 2016-11-23 ENCOUNTER — Inpatient Hospital Stay: Payer: MEDICARE

## 2016-11-23 ENCOUNTER — Inpatient Hospital Stay: Admit: 2016-11-23 | Payer: MEDICARE

## 2016-11-23 ENCOUNTER — Inpatient Hospital Stay
Admit: 2016-11-23 | Discharge: 2016-11-26 | Disposition: A | Discharge status: Home / Self Care | Payer: MEDICARE | Source: Ambulatory Visit

## 2016-11-23 DIAGNOSIS — D3A8 Other benign neuroendocrine tumors: Principal | ICD-10-CM

## 2016-11-23 LAB — BASIC METABOLIC PANEL
Anion Gap: 9 mmol/L (ref 3–16)
BUN: 24 mg/dL (ref 7–25)
CO2: 23 mmol/L (ref 21–33)
Calcium: 8.2 mg/dL (ref 8.6–10.3)
Chloride: 107 mmol/L (ref 98–110)
Creatinine: 1 mg/dL (ref 0.60–1.30)
Glucose: 158 mg/dL (ref 70–100)
Osmolality, Calculated: 295 mOsm/kg (ref 278–305)
Potassium: 3.9 mmol/L (ref 3.5–5.3)
Sodium: 139 mmol/L (ref 133–146)
eGFR AA CKD-EPI: 68 See note.
eGFR NONAA CKD-EPI: 59 See note.

## 2016-11-23 LAB — BLOOD GAS, ARTERIAL
%HBO2, Arterial: 95.1 % (ref 95.0–98.0)
Base Excess, Arterial: -2.9 mmol/L — ABNORMAL LOW (ref <=3.0)
CO2 Content,Arterial: 27 mmol/L (ref 23–27)
Carboxyhemoglobin, Arterial: 1 %
HCO3, Arterial: 25 mmol/L (ref 22–26)
Methemoglobin, Arterial: 0.7 % (ref 0.0–1.5)
Reduced hemoglobin, Arterial: 3.2 % (ref 0.0–5.0)
pCO2, Arterial: 57 mmHg — ABNORMAL HIGH (ref 35–45)
pH, Arterial: 7.25 — ABNORMAL LOW (ref 7.35–7.45)
pO2, Arterial: 100 mmHg (ref 80–100)

## 2016-11-23 LAB — MAGNESIUM: Magnesium: 1.7 mg/dL (ref 1.5–2.5)

## 2016-11-23 LAB — LACTIC ACID, ARTERIAL, WHOLE BLOOD: Lactate, Art: 1.2 mmol/L (ref 0.5–1.6)

## 2016-11-23 LAB — CBC
Hematocrit: 37.7 % (ref 35.0–45.0)
Hemoglobin: 13.1 g/dL (ref 11.7–15.5)
MCH: 31.8 pg (ref 27.0–33.0)
MCHC: 34.8 g/dL (ref 32.0–36.0)
MCV: 91.5 fL (ref 80.0–100.0)
MPV: 8.8 fL (ref 7.5–11.5)
Platelets: 169 10*3/uL (ref 140–400)
RBC: 4.12 10*6/uL (ref 3.80–5.10)
RDW: 13.4 % (ref 11.0–15.0)
WBC: 12.7 10*3/uL (ref 3.8–10.8)

## 2016-11-23 LAB — GLUCOSE, BLOOD GAS: Glucose, Blood Gas: 158 mg/dL — ABNORMAL HIGH (ref 70–100)

## 2016-11-23 LAB — FREE CALCIUM, WHOLE BLOOD: Free Calcium, WB: 4.53 mg/dL (ref 4.50–5.30)

## 2016-11-23 LAB — POTASSIUM, BLOOD GAS: Potassium, Blood Gas: 4.1 mEq/L (ref 3.5–5.3)

## 2016-11-23 LAB — PHOSPHORUS: Phosphorus: 3.1 mg/dL (ref 2.1–4.5)

## 2016-11-23 LAB — SODIUM, BLOOD GAS: Sodium, Blood Gas: 141 mEq/L (ref 136–146)

## 2016-11-23 LAB — CALCIUM: Calcium: 8.2 mg/dL (ref 8.6–10.3)

## 2016-11-23 LAB — HEMATOCRIT, BLOOD GAS: Hct, blood gas: 40.5 % (ref 35–45)

## 2016-11-23 LAB — HEMOGLOBIN, BLOOD GAS: Hgb, blood gas: 13.2 g/dL (ref 12.0–16.0)

## 2016-11-23 MED ORDER — proMETHazine (PHENERGAN) tablet 12.5 mg
25 | Freq: Four times a day (QID) | ORAL | Status: AC | PRN
Start: 2016-11-23 — End: 2016-11-26

## 2016-11-23 MED ORDER — potassium chloride (KCl)/Sterile water 100 mL 10 mEq/100 mL IVPB 10 mEq
10 | INTRAVENOUS | Status: AC
Start: 2016-11-23 — End: 2016-11-23
  Administered 2016-11-23: 23:00:00 10 meq via INTRAVENOUS

## 2016-11-23 MED ORDER — ICU ELECTROLYTE REPLACEMENT PROTOCOL
Status: AC | PRN
Start: 2016-11-23 — End: 2016-11-23

## 2016-11-23 MED ORDER — celecoxib (CELEBREX) capsule 400 mg
200 | ORAL | Status: AC | PRN
Start: 2016-11-23 — End: 2016-11-23
  Administered 2016-11-23: 10:00:00 400 mg via ORAL

## 2016-11-23 MED ORDER — rocuronium (ZEMURON) 10 mg/mL injection
10 | INTRAVENOUS | Status: AC
Start: 2016-11-23 — End: ?

## 2016-11-23 MED ORDER — electrolyte-R (pH 7.4) (NORMOSOL-R pH 7.4) iv solution SolP
INTRAVENOUS | Status: AC
Start: 2016-11-23 — End: 2016-11-24
  Administered 2016-11-23: 17:00:00 50 mL/h via INTRAVENOUS

## 2016-11-23 MED ORDER — dexamethasone (DECADRON) injection
4 | INTRAMUSCULAR | Status: AC | PRN
Start: 2016-11-23 — End: 2016-11-23
  Administered 2016-11-23: 13:00:00 4 via INTRAVENOUS

## 2016-11-23 MED ORDER — succinylcholine (QUELICIN) 20 mg/mL injection
20 | INTRAMUSCULAR | Status: AC
Start: 2016-11-23 — End: ?

## 2016-11-23 MED ORDER — nortriptyline (PAMELOR) capsule 10 mg
10 | Freq: Every evening | ORAL | Status: AC
Start: 2016-11-23 — End: 2016-11-25
  Administered 2016-11-24 – 2016-11-25 (×2): 10 mg via ORAL

## 2016-11-23 MED ORDER — pantoprazole (PROTONIX) EC tablet 40 mg
40 | Freq: Every day | ORAL | Status: AC
Start: 2016-11-23 — End: 2016-11-26
  Administered 2016-11-24 – 2016-11-26 (×3): 40 mg via ORAL

## 2016-11-23 MED ORDER — fentaNYL (SUBLIMAZE) 50 mcg/mL injection
50 | INTRAMUSCULAR | Status: AC
Start: 2016-11-23 — End: 2016-11-23
  Administered 2016-11-23: 16:00:00 25 via INTRAVENOUS

## 2016-11-23 MED ORDER — fentaNYL (SUBLIMAZE) 50 mcg/mL injection
50 | INTRAMUSCULAR | Status: AC
Start: 2016-11-23 — End: ?

## 2016-11-23 MED ORDER — citalopram (CELEXA) tablet 10 mg
20 | Freq: Every day | ORAL | Status: AC
Start: 2016-11-23 — End: 2016-11-25
  Administered 2016-11-24 – 2016-11-25 (×2): 10 mg via ORAL

## 2016-11-23 MED ORDER — fentaNYL (SUBLIMAZE) injection 25 mcg
50 | Freq: Once | INTRAMUSCULAR | Status: AC
Start: 2016-11-23 — End: 2016-11-23

## 2016-11-23 MED ORDER — neostigmine methylsulfate (PROSTIGMIN) 1 mg/mL IV solution
1 | INTRAVENOUS | Status: AC
Start: 2016-11-23 — End: ?

## 2016-11-23 MED ORDER — electrolyte-R (pH 7.4) (NORMOSOL-R pH 7.4) iv solution SolP
INTRAVENOUS | Status: AC | PRN
Start: 2016-11-23 — End: 2016-11-23
  Administered 2016-11-23: 12:00:00 via INTRAVENOUS

## 2016-11-23 MED ORDER — bupivacaine (MARCAINE) 0.125% in sodium chloride 0.9% 250 mL epidural
INTRAVENOUS | Status: AC
Start: 2016-11-23 — End: 2016-11-26
  Administered 2016-11-25: 11:00:00 via EPIDURAL

## 2016-11-23 MED ORDER — sterile water irrigation
Status: AC | PRN
Start: 2016-11-23 — End: 2016-11-23
  Administered 2016-11-23: 14:00:00 2000

## 2016-11-23 MED ORDER — phenylephrine (NEO-SYNEPHRINE) injection
10 | INTRAMUSCULAR | Status: AC | PRN
Start: 2016-11-23 — End: 2016-11-23
  Administered 2016-11-23 (×2): 100 via INTRAVENOUS

## 2016-11-23 MED ORDER — ePHEDrine sulfate 50 mg/mL IV solution
50 | INTRAVENOUS | Status: AC
Start: 2016-11-23 — End: ?

## 2016-11-23 MED ORDER — lisinopril (PRINIVIL,ZESTRIL) tablet 10 mg
10 | Freq: Every day | ORAL | Status: AC
Start: 2016-11-23 — End: 2016-11-23

## 2016-11-23 MED ORDER — thrombin-fibrinogen (TISSEEL-VH) 4 mL Kit
4 | TOPICAL | Status: AC | PRN
Start: 2016-11-23 — End: 2016-11-23
  Administered 2016-11-23: 14:00:00 4 via TOPICAL

## 2016-11-23 MED ORDER — potassium chloride (KCl)/Sterile water 100 mL 10 mEq/100 mL IVPB 10 mEq
10 | INTRAVENOUS | Status: AC
Start: 2016-11-23 — End: 2016-11-24
  Administered 2016-11-24 (×3): 10 meq via INTRAVENOUS

## 2016-11-23 MED ORDER — dextrose 5 % and 0.45 % NaCl with KCl 20 mEq infusion 1000 mL
20 | INTRAVENOUS | Status: AC
Start: 2016-11-23 — End: 2016-11-23

## 2016-11-23 MED ORDER — hydroCHLOROthiazide (HYDRODIURIL) tablet 25 mg
25 | Freq: Every day | ORAL | Status: AC
Start: 2016-11-23 — End: 2016-11-23

## 2016-11-23 MED ORDER — bupivacaine (PF)(SENSORCAINE) 0.25% injection
0.25 | INTRAMUSCULAR | Status: AC | PRN
Start: 2016-11-23 — End: 2016-11-23
  Administered 2016-11-23 (×2): 3 via EPIDURAL

## 2016-11-23 MED ORDER — lidocaine (PF) 2% (20 mg/mL) Soln 20 mg
20 | Freq: Once | INTRAMUSCULAR | Status: AC | PRN
Start: 2016-11-23 — End: 2016-11-23

## 2016-11-23 MED ORDER — lidocaine (PF) 2% (20 mg/mL) 20 mg/mL (2 %) Soln
20 | INTRAMUSCULAR | Status: AC
Start: 2016-11-23 — End: ?

## 2016-11-23 MED ORDER — acetaminophen (TYLENOL) tablet 975 mg
325 | ORAL | Status: AC | PRN
Start: 2016-11-23 — End: 2016-11-23
  Administered 2016-11-23: 10:00:00 975 mg via ORAL

## 2016-11-23 MED ORDER — multivitamin with folic acid tablet 1 tablet
400 | Freq: Every day | ORAL | Status: AC
Start: 2016-11-23 — End: 2016-11-26
  Administered 2016-11-24 – 2016-11-26 (×3): 1 via ORAL

## 2016-11-23 MED ORDER — docusate sodium (COLACE) capsule 100 mg
100 | Freq: Two times a day (BID) | ORAL | Status: AC
Start: 2016-11-23 — End: 2016-11-26
  Administered 2016-11-25 (×2): 100 mg via ORAL

## 2016-11-23 MED ORDER — atorvastatin (LIPITOR) tablet 10 mg
10 | Freq: Every evening | ORAL | Status: AC
Start: 2016-11-23 — End: 2016-11-26
  Administered 2016-11-25 – 2016-11-26 (×2): 10 mg via ORAL

## 2016-11-23 MED ORDER — ondansetronZOFRANinjection
4 | INTRAMUSCULAR | Status: AC | PRN
Start: 2016-11-23 — End: 2016-11-23
  Administered 2016-11-23: 15:00:00 4 via INTRAVENOUS

## 2016-11-23 MED ORDER — vancomycin (VANCOCIN) 2,000 mg in sodium chloride 0.9 % 500 mL IVPB
INTRAVENOUS | Status: AC | PRN
Start: 2016-11-23 — End: 2016-11-23
  Administered 2016-11-23: 10:00:00 2000 mg/kg via INTRAVENOUS

## 2016-11-23 MED ORDER — ICU ELECTROLYTE REPLACEMENT PROTOCOL
Status: AC | PRN
Start: 2016-11-23 — End: 2016-11-26

## 2016-11-23 MED ORDER — roPINIRole (REQUIP) tablet 0.25 mg
0.25 | Freq: Every day | ORAL | Status: AC
Start: 2016-11-23 — End: 2016-11-23

## 2016-11-23 MED ORDER — heparin (porcine) injection 5,000 Units
5000 | Freq: Three times a day (TID) | INTRAMUSCULAR | Status: AC
Start: 2016-11-23 — End: 2016-11-26
  Administered 2016-11-23 – 2016-11-26 (×7): 5000 [IU] via SUBCUTANEOUS

## 2016-11-23 MED ORDER — lipase-protease-amylase (CREON (24,000 units of lipase)) CpDR 2 capsule
24000-76000 | Freq: Four times a day (QID) | ORAL | Status: AC
Start: 2016-11-23 — End: 2016-11-26
  Administered 2016-11-24 – 2016-11-26 (×10): 2 via ORAL

## 2016-11-23 MED ORDER — midazolam (PF) (VERSED) injection
1 | INTRAMUSCULAR | Status: AC | PRN
Start: 2016-11-23 — End: 2016-11-23
  Administered 2016-11-23: 12:00:00 2 via INTRAVENOUS

## 2016-11-23 MED ORDER — albuterol (PROVENTIL;VENTOLIN;PROAIR) inhaler
90 | RESPIRATORY_TRACT | Status: AC | PRN
Start: 2016-11-23 — End: 2016-11-23
  Administered 2016-11-23: 15:00:00 8 via RESPIRATORY_TRACT

## 2016-11-23 MED ORDER — rocuronium (ZEMURON) injection
10 | INTRAVENOUS | Status: AC | PRN
Start: 2016-11-23 — End: 2016-11-23
  Administered 2016-11-23: 12:00:00 50 via INTRAVENOUS
  Administered 2016-11-23 (×2): 20 via INTRAVENOUS
  Administered 2016-11-23: 13:00:00 10 via INTRAVENOUS

## 2016-11-23 MED ORDER — bupivacaine-EPINEPHrine 0.25 %-1:200,000 injection
0.25 | INTRAMUSCULAR | Status: AC | PRN
Start: 2016-11-23 — End: 2016-11-23
  Administered 2016-11-23: 14:00:00 19

## 2016-11-23 MED ORDER — oxymetazoline (AFRIN) 0.05 % nasal spray 2 spray
0.05 | Freq: Two times a day (BID) | NASAL | Status: AC
Start: 2016-11-23 — End: 2016-11-26
  Administered 2016-11-24 – 2016-11-26 (×4): 2 via NASAL

## 2016-11-23 MED ORDER — propofol 10 mg/ml (DIPRIVAN) 10 mg/mL injection
10 | INTRAVENOUS | Status: AC
Start: 2016-11-23 — End: ?

## 2016-11-23 MED ORDER — dexamethasone (DECADRON) 4 mg/mL injection
4 | INTRAMUSCULAR | Status: AC
Start: 2016-11-23 — End: ?

## 2016-11-23 MED ORDER — midazolam (PF) (VERSED) 1 mg/mL injection
1 | INTRAMUSCULAR | Status: AC
Start: 2016-11-23 — End: ?

## 2016-11-23 MED ORDER — gabapentin (NEURONTIN) capsule 600 mg
300 | ORAL | Status: AC | PRN
Start: 2016-11-23 — End: 2016-11-23
  Administered 2016-11-23: 10:00:00 600 mg via ORAL

## 2016-11-23 MED ORDER — ketorolac (TORADOL) injection 15 mg
15 | Freq: Four times a day (QID) | INTRAMUSCULAR | Status: AC
Start: 2016-11-23 — End: 2016-11-26
  Administered 2016-11-23 – 2016-11-26 (×12): 15 mg via INTRAVENOUS

## 2016-11-23 MED ORDER — melatonin Tab 3 mg
3 | Freq: Every evening | ORAL | Status: AC
Start: 2016-11-23 — End: 2016-11-25
  Administered 2016-11-24 – 2016-11-25 (×2): 3 mg via ORAL

## 2016-11-23 MED ORDER — magnesium sulfate in D5W 100 mL 1 gram/100 mL IVPB 1 g
1 | INTRAVENOUS | Status: AC
Start: 2016-11-23 — End: 2016-11-23
  Administered 2016-11-23 (×4): 1 g via INTRAVENOUS

## 2016-11-23 MED ORDER — fentaNYL (SUBLIMAZE) injection
50 | INTRAMUSCULAR | Status: AC | PRN
Start: 2016-11-23 — End: 2016-11-23
  Administered 2016-11-23: 12:00:00 50 via INTRAVENOUS
  Administered 2016-11-23 (×2): 25 via INTRAVENOUS

## 2016-11-23 MED ORDER — PARoxetine (PAXIL) tablet 10 mg
10 | Freq: Every morning | ORAL | Status: AC
Start: 2016-11-23 — End: 2016-11-26
  Administered 2016-11-24 – 2016-11-26 (×3): 10 mg via ORAL

## 2016-11-23 MED ORDER — ePHEDrine sulfate IV solution
50 | INTRAVENOUS | Status: AC | PRN
Start: 2016-11-23 — End: 2016-11-23
  Administered 2016-11-23 (×4): 5 via INTRAVENOUS

## 2016-11-23 MED ORDER — lidocaine (PF) 20 mg/mL (2 %) Soln
20 | INTRAVENOUS | Status: AC | PRN
Start: 2016-11-23 — End: 2016-11-23
  Administered 2016-11-23: 12:00:00 80 via INTRAVENOUS

## 2016-11-23 MED ORDER — EPINEPHrine 50 MCG/ 10 ML SYRINGE FOR BRADYCARDIA
Status: AC | PRN
Start: 2016-11-23 — End: 2016-11-23
  Administered 2016-11-23 (×2): 5 via INTRAVENOUS
  Administered 2016-11-23: 12:00:00 15 via INTRAVENOUS
  Administered 2016-11-23 (×5): 5 via INTRAVENOUS

## 2016-11-23 MED ORDER — heparin (porcine) injection 5,000 Units
5000 | Freq: Once | INTRAMUSCULAR | Status: AC
Start: 2016-11-23 — End: 2016-11-23
  Administered 2016-11-23: 12:00:00 5000 [IU] via SUBCUTANEOUS

## 2016-11-23 MED ORDER — lidocaine (LIDODERM) 5 % 1 patch
5 | TOPICAL | Status: AC
Start: 2016-11-23 — End: 2016-11-26
  Administered 2016-11-23 – 2016-11-25 (×3): 1 via TRANSDERMAL

## 2016-11-23 MED ORDER — propofol 10 mg/ml (DIPRIVAN) injection
10 | INTRAVENOUS | Status: AC | PRN
Start: 2016-11-23 — End: 2016-11-23
  Administered 2016-11-23: 12:00:00 200 via INTRAVENOUS

## 2016-11-23 MED ORDER — metoprolol tartrate (LOPRESSOR) injection 5 mg
5 | Freq: Four times a day (QID) | INTRAVENOUS | Status: AC
Start: 2016-11-23 — End: 2016-11-24
  Administered 2016-11-23 (×2): 5 mg via INTRAVENOUS

## 2016-11-23 MED ORDER — sugammadex (BRIDION) IV injection
100 | INTRAVENOUS | Status: AC | PRN
Start: 2016-11-23 — End: 2016-11-23
  Administered 2016-11-23: 15:00:00 200 via INTRAVENOUS

## 2016-11-23 MED FILL — PROPOFOL 10 MG/ML INTRAVENOUS EMULSION: 10 10 mg/mL | INTRAVENOUS | Qty: 20

## 2016-11-23 MED FILL — NORTRIPTYLINE 10 MG CAPSULE: 10 10 MG | ORAL | Qty: 1

## 2016-11-23 MED FILL — DOCUSATE SODIUM 100 MG CAPSULE: 100 100 MG | ORAL | Qty: 1

## 2016-11-23 MED FILL — QUELICIN 20 MG/ML INJECTION SOLUTION: 20 20 mg/mL | INTRAMUSCULAR | Qty: 10

## 2016-11-23 MED FILL — FENTANYL (PF) 50 MCG/ML INJECTION SOLUTION: 50 50 mcg/mL | INTRAMUSCULAR | Qty: 2

## 2016-11-23 MED FILL — MAGNESIUM SULFATE 1 GRAM/100 ML IN DEXTROSE 5 % INTRAVENOUS PIGGYBACK: 1 1 gram/100 mL | INTRAVENOUS | Qty: 100

## 2016-11-23 MED FILL — CREON 24,000-76,000-120,000 UNIT CAPSULE,DELAYED RELEASE: 24000-76000 24,000-76,000 -120,000 unit | ORAL | Qty: 2

## 2016-11-23 MED FILL — KETOROLAC 15 MG/ML INJECTION SOLUTION: 15 15 mg/mL | INTRAMUSCULAR | Qty: 1

## 2016-11-23 MED FILL — MAGNESIUM SULFATE 1 GRAM/100 ML IN DEXTROSE 5 % INTRAVENOUS PIGGYBACK: 1 1 gram/100 mL | INTRAVENOUS | Qty: 200

## 2016-11-23 MED FILL — GABAPENTIN 300 MG CAPSULE: 300 300 MG | ORAL | Qty: 2

## 2016-11-23 MED FILL — HEPARIN (PORCINE) 5,000 UNIT/ML INJECTION SOLUTION: 5000 5,000 unit/mL | INTRAMUSCULAR | Qty: 1

## 2016-11-23 MED FILL — ROPINIROLE 0.25 MG TABLET: 0.25 0.25 MG | ORAL | Qty: 1

## 2016-11-23 MED FILL — BUPIVACAINE (PF) 0.75 % (7.5 MG/ML) INJECTION SOLUTION: 0.75 0.75 % (7.5 mg/mL) | INTRAMUSCULAR | Qty: 41.65

## 2016-11-23 MED FILL — AKOVAZ 50 MG/ML INTRAVENOUS SOLUTION: 50 50 mg/mL | INTRAVENOUS | Qty: 1

## 2016-11-23 MED FILL — ICU ELECTROLYTE REPLACEMENT PROTOCOL: Qty: 1

## 2016-11-23 MED FILL — ROCURONIUM 10 MG/ML INTRAVENOUS SOLUTION: 10 10 mg/mL | INTRAVENOUS | Qty: 1

## 2016-11-23 MED FILL — METOPROLOL TARTRATE 5 MG/5 ML INTRAVENOUS SOLUTION: 5 5 mg/5 mL | INTRAVENOUS | Qty: 5

## 2016-11-23 MED FILL — PAROXETINE 10 MG TABLET: 10 10 MG | ORAL | Qty: 1

## 2016-11-23 MED FILL — XYLOCAINE-MPF 20 MG/ML (2 %) INJECTION SOLUTION: 20 20 mg/mL (2 %) | INTRAMUSCULAR | Qty: 5

## 2016-11-23 MED FILL — DEXAMETHASONE SODIUM PHOSPHATE 4 MG/ML INJECTION SOLUTION: 4 4 mg/mL | INTRAMUSCULAR | Qty: 1

## 2016-11-23 MED FILL — POTASSIUM CHLORIDE 10 MEQ/100ML IN STERILE WATER INTRAVENOUS PIGGYBACK: 10 10 mEq/100 mL | INTRAVENOUS | Qty: 100

## 2016-11-23 MED FILL — CITALOPRAM 20 MG TABLET: 20 20 MG | ORAL | Qty: 1

## 2016-11-23 MED FILL — NEOSTIGMINE METHYLSULFATE 1 MG/ML INTRAVENOUS SOLUTION: 1 1 mg/mL | INTRAVENOUS | Qty: 10

## 2016-11-23 MED FILL — LIDODERM 5 % TOPICAL PATCH: 5 5 % | TOPICAL | Qty: 1

## 2016-11-23 MED FILL — AFRIN (OXYMETAZOLINE) 0.05 % NASAL SPRAY: 0.05 0.05 % | NASAL | Qty: 15

## 2016-11-23 MED FILL — CELEBREX 200 MG CAPSULE: 200 200 mg | ORAL | Qty: 2

## 2016-11-23 MED FILL — TYLENOL 325 MG TABLET: 325 325 mg | ORAL | Qty: 3

## 2016-11-23 MED FILL — MELATONIN 3 MG TABLET: 3 3 mg | ORAL | Qty: 1

## 2016-11-23 MED FILL — MIDAZOLAM (PF) 1 MG/ML INJECTION SOLUTION: 1 1 mg/mL | INTRAMUSCULAR | Qty: 2

## 2016-11-23 NOTE — Consults (Signed)
CRITICAL CARE ANESTHESIOLOGY TEAM NOTE  The following assessment and plan has been discussed in a collaborative fashion with [facility] faculty, Dr. Willeen Cass   Daily Plan    Follow up post op labs   Discuss toradol with thoracic surgery  CXR  Diagnosis, Assessment & Plan     Acute respiratory insufficiency:currently on 100% NRB. Wean O2 to NC. Aggressive pulmonary toilet with IS    Lung nodule: s/p right sided VATS middle lobe lobectomy. No airleak on right CT. CT to 20 cm suction. Sang. Dg, small amount. CXR. Metoprolol with hold parameters for AFIB prophylaxis     OSA/obesity:s/p UPPP surgery, no CPAP.    HTN: on lisinopril and HCTZ. Hold anti-hypertensives    Carotid stenosis:20-49% (L) ICA stenosis on carotid duplex from 2010. Pt has been on statin and is asymptomatic    HLD:at home on statin.     Pain: thoracic bupivicaine epidural at 4 ml/hr.     Migraines:previously has taken phenergan which helps but causes RLS symptoms to increase    ZOX:WRUEAVWU on requip. No longer taking     GERD:no longer on PPI.     Leukocytosis: WBC 12.7. Likely reactive. Continue to monitor     Tubes / Lines / Drains:   Patient Lines/Drains/Airways Status    Active Epidural Line / PICC Line / PIV Line / ART Line / Line / CVC Line     Name:   Placement date:   Placement time:   Site:   Days:    Peripheral IV 11/23/16 Right Wrist  11/23/16    0750    Wrist    less than 1    Peripheral IV Left Wrist          Wrist        Arterial Line 11/23/16 Left Radial  11/23/16    0737    Radial    less than 1              DVT Prophylaxis: heparin subcutaneous   GI Prophylaxis: Protonix   Disposition: ICU status  Code Status: Full Code    Chief Complaint   Lung nodule     History   HPI:  Joanna Lopez is an 66 y.o. female seen on 11/23/2016. Pt admitted on 11/23/2016 with a primary diagnosis of Lung nodule.  Today is s/p right sided VATS middle lobectomy     OR course:  Received 1.2 L crystalloid. 185 ml UOP. 75 ml EBL\    Airway: grade 1  view    Review of Systems:   Denies chest pain, shortness of breath, palpitations, nausea, vomiting, diarrhea, constipation, dizziness, blurred vision, headaches, chills, fever or malaise.  c/o right sided shoulder pain    Past Medical History     Past Medical History:   Diagnosis Date    Basal cell cancer     GERD (gastroesophageal reflux disease)     Hyperlipidemia     Hypertension     IBS (irritable bowel syndrome)     OSA (obstructive sleep apnea)        Past Surgical History     Past Surgical History:   Procedure Laterality Date    APPENDECTOMY      COLONOSCOPY N/A 01/23/2013    Procedure: COLONOSCOPY WITH MAC;  Surgeon: Lurene Shadow, MD;  Location: [facility] ENDOSCOPY;  Service: Gastroenterology;  Laterality: N/A;    FLEXIBLE BRONCHOSCOPY W/ UPPER ENDOSCOPY N/A 09/17/2016    Procedure: BRONCHOSCOPY SUPER D WITH FLUORO AND BIOPSY-EBUS/NAVI;  Surgeon: Johnny Bridge, MD;  Location: [facility] ENDOSCOPY;  Service: Pulmonary;  Laterality: N/A;    submastoid gland removal      TONSILLECTOMY      UVULOPALATOPHARYNGOPLASTY         Family History     Family History   Problem Relation Age of Onset    Adopted: Yes    Diabetes Son        Social History     Social History     Social History    Marital status: Married     Spouse name: N/A    Number of children: N/A    Years of education: N/A     Occupational History    Not on file.     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 Authorizing Provider   atorvastatin (LIPITOR) 10 MG tablet  08/17/16  Yes  Historical Provider, MD   citalopram (CELEXA) 10 MG tablet Take 10 mg by mouth daily.   Yes Historical Provider, MD   diclofenac (VOLTAREN) 75 MG EC tablet Take 1 tablet (75 mg total) by mouth 2 times a day. 02/07/15  Yes Laverle Patter, MD   dicyclomine (BENTYL) 20 mg tablet Take 20 mg by mouth 4 times a day with meals and at bedtime.   Yes Historical Provider, MD   hydroCHLOROthiazide (HYDRODIURIL) 25 MG tablet  07/17/16  Yes Historical Provider, MD   lisinopril (PRINIVIL,ZESTRIL) 10 MG tablet  12/20/13  Yes Historical Provider, MD   melatonin 3 mg Tab Take 3 mg by mouth at bedtime. PRN for sleep   Yes Historical Provider, MD   oxymetazoline (AFRIN) 0.05 % nasal spray Use 2 sprays into each nostril 2 times a day. Nightly for sleep   Yes Historical Provider, MD   PARoxetine (PAXIL) 10 MG tablet Take 10 mg by mouth every morning.   Yes Historical Provider, MD   proMETHazine (PHENERGAN) 12.5 MG tablet Take 12.5 mg by mouth every 6 hours as needed for Nausea. For nausea associated with migraine   Yes Historical Provider, MD   esomeprazole (NEXIUM) 40 MG capsule Take 40 mg by mouth every morning before breakfast.    Historical Provider, MD   hyoscyamine (LEVSIN) 0.125 mg tablet Take 1 tablet (0.125 mg total) by mouth 2 times a day as needed for Cramping. Indications: DIARRHEA 07/21/13   Elias Else, CNP   lipase-protease-amylase (CREON) 24,000-76,000 -120,000 unit CpDR Take 2 capsules po with each meal.  Patient taking differently: Take 2 capsules po with each meal.  Taking PRN Indications: exocrine pancreatic insufficiency          06/07/13   Elias Else, CNP   multivitamin (MULTIVITAMIN) tablet Take 1 tablet by mouth daily.    Historical Provider, MD   nortriptyline (PAMELOR) 10  MG capsule Take 1 capsule (10 mg total) by mouth at bedtime. 08/17/13   Elias Else, CNP   roPINIRole (REQUIP) 0.25 MG tablet Take 1 tablet (0.25 mg total) by mouth daily with dinner. Take one tablet (0.25 mg) 1-2 hours before  bedtime, for three days, then take 2 tablets (0.5 mg) 1-2 hours before bedtime for four days, then take 4 tablets (1 mg) 1-2 hours before bedtime thereafter 06/08/13   Georgena Spurling, MD   UNABLE TO FIND Cholesterol medication , unknown to pt    Historical Provider, MD        Physical Exam   Temp:  [97.6 F (36.4 C)] 97.6 F (36.4 C)  Heart Rate:  [68] 68  Resp:  [18] 18  BP: (112)/(71) 112/71  FiO2:  [94 %-100 %] 100 %     Constitutional:  Vital signs above   Eyes:  PERRLA   Non-injected, anicteric   ENMT:  Moist mucous membranes without erythema or exudate in the oropharynx   Neck:  No masses, trachea midline   Respiratory:  Normal work of breathing   Clear and diminished on right side to auscultation, no wheezes, crackles, or rhonchi   Right chest incision with dermabond intact   Right sided CT to 20 cm suction draining sang. Dg, small amount. No airleak   Cardiovascular:  Regular rate and rhythm   No gallops, rubs, or murmurs   DP / PT pulses 2+ and equal bilaterally   No lower extremity edema   Gastrointestinal:  Non-tender, non-distended    hypoactive BS x 4    Musculoskeletal:  Digits without clubbing, cyanosis, or edema   Neurologic:  Cranial nerves II-XII intact   MAE Spontaneously, Equal strength bilaterally in all extremities   Psychiatric:  Oriented to person, place, and time   Skin:  Normal turgor   No ecchymosis, rashes, or lesions   Other       Jackey Loge, CNP  Critical Care Anesthesiology Team   [phone]  11/23/2016  12:08 PM

## 2016-11-23 NOTE — Unmapped (Signed)
[facility] Progress Note:    Received page that patient was having a lot of R shoulder pain.  Upon assessment, pt is visibly uncomfortable; states that she does not feel like she has any pain in her chest at this time.  Did not seem to improve much after fentanyl per [facility] team.  Educated pt about her epidural bolus button, though I suspect this is related to pain from the chest tube and would be improved by from toradol.  OK per primary team to receive toradol. Renal panel mostly unchanged from preop labs. Pt received celebrex at 0615, so should be OK for toradol at 1315.  Also recommend IV tylenol after 6 hours, which would also be at 1315.  Discussed with primary team and [facility] team.    Cory Munch, MD  Anesthesiology, PGY3

## 2016-11-23 NOTE — Unmapped (Signed)
Anesthesia Post Note    Patient: Joanna Lopez    Procedure(s) Performed: Procedure(s):  RIGHT SIDED VATS MIDDLE LOBE LOBECTOMY,HILAR MEDIASTINAL LYMPH NODE DISSECTION    Anesthesia type: general endotracheal    Patient location: ICU    Post pain: Adequate analgesia    Post assessment: no apparent anesthetic complications    Last Vitals:   Vitals:    11/23/16 1230 11/23/16 1300 11/23/16 1400 11/23/16 1500   BP:       Pulse: 72 75 74 77   Resp: 20 19 19 17    Temp:       TempSrc:       SpO2: 95% 97% 99% 100%   Weight:       Height:            Post vital signs: stable    Level of consciousness: awake, alert  and oriented    Complications: None

## 2016-11-23 NOTE — Procedures (Signed)
Joanna Lopez is a 66 y.o. female patient.  1. Lung nodule      Past Medical History:   Diagnosis Date    Basal cell cancer     GERD (gastroesophageal reflux disease)     Hyperlipidemia     Hypertension     IBS (irritable bowel syndrome)     OSA (obstructive sleep apnea)      Blood pressure 112/71, pulse 68, temperature 97.6 F (36.4 C), temperature source Temporal, resp. rate 18, height 5' 6 (1.676 m), weight 218 lb (98.9 kg), SpO2 96 %.    Neuraxial Block  Block Reason: post-op pain management     Diagnosis:  postoperative pain management   Patient Location during procedure:  pre-op holding (SDS)   Block Timing:  preoperatively        Block Type:  Truncal/Neuraxial: thoracic epidural    Laterality:  paramedian    Preanesthetic Checklist  Completed:  patient identified, IV checked, risks and benefits discussed, monitors and equipment checked, pre-op evaluation, timeout performed, anesthesia consent, hand hygiene performed, patient being monitored, patient agrees, verbalizes understanding, and wants to proceed and questions answered / anesthesia plan accepted      Patient Position:  sitting  Timeout performed at:  06:32  Timeout verification:  correct patient, correct procedure, correct site, allergies reviewed and anticoagulant precautions reviewed    Timeout performed by:  Arnetha Courser, MD  Prep: Chloraprep and Draped      Pre Procedure Sedation: Midazolam       Midazolam  2 mg given for pre procedure sedation.     Block Start Time:  11/23/2016 6:33 AM    Needle / Technique:   Needle: Tuohy 17 G 3.5 in   Injection Technique:  catheter   Needle Insertion Depth: 7.5 cm    Catheter Insertion Depth:  11 cm  Epidural Level:  T5-6   Epidural attempts:  1   Test Dose: lidocaine 1.5% with Epi 1:200 K 3 mL(s)             Narrative:           Complications:   blood not aspirated, injection not painful, no injection resistance, no paresthesia, no other event, no positive test dose and no dural tap    See EMR for  periprocedural vitals signs. Stable thoroughout unless otherwise documented     End Time:  11/23/2016 6:42 AM      Staffing  Anesthesiologist:  Rollene Fare  Resident/CRNA:  Christian Mate     Performed by:  Residents              Rollene Fare  11/23/2016

## 2016-11-23 NOTE — Unmapped (Signed)
I have reviewed the outpatient thoracic surgery clinic note and there are no changes to the H&P.

## 2016-11-23 NOTE — Unmapped (Signed)
Anesthesia Transfer of Care Note    Patient: Joanna Lopez  Procedure(s) Performed: Procedure(s):  RIGHT SIDED VATS MIDDLE LOBE LOBECTOMY,HILAR MEDIASTINAL LYMPH NODE DISSECTION    Patient location: ICU    Anesthesia type: general endotracheal    Airway Device on Arrival to PACU/ICU: Non-rebreather Mask    IV Access: Peripheralx2    Monitors Recommended to be Used During PACU/ICU: Arterial Line    Outstanding Issues to Address: None    Level of Consciousness: awake and alert     Post vital signs:    Vitals:    11/23/16 0558   BP: 123/69   Pulse: 75   Resp: 20   Temp: 97.5 ??F   SpO2: 100%       Complications: None      Date 11/22/16 0700 - 11/23/16 0659(Not Admitted) 11/23/16 0700 - 11/24/16 0659   Shift 0700-1459 1500-2259 2300-0659 24 Hour Total 0700-1459 1500-2259 2300-0659 24 Hour Total   I  N  T  A  K  E   I.V.     1000  (10.1)   1000  (10.1)      Volume (mL) (electrolyte-R (pH 7.4) (NORMOSOL-R pH 7.4) iv solution SolP)     1000   1000    Shift Total  (mL/kg)     1000  (10.1)   1000  (10.1)   O  U  T  P  U  T   Urine     185   185      Urine     185   185    Shift Total  (mL/kg)     185  (1.9)   185  (1.9)   Weight (kg)   98.9 98.9 98.9 98.9 98.9 98.9

## 2016-11-23 NOTE — Unmapped (Signed)
INTRA-OP POST BRIEFING NOTE: Joanna Lopez      Specimens: 5 per Dr. Harriette Bouillon  Specimens     ID Source Type Tests Collected By Collected At Frozen? Attributes Order ID Breast Spec Formalin Marked as Sent    A Lung Right Middle Tissue ?? SURGICAL PATHOLOGY EXAM   Janett Billow, MD 11/23/16 [phone] No Sent in Formalin ?? [id number]    11/23/16 1115    B Lung Right Middle Tissue ?? SURGICAL PATHOLOGY EXAM   Janett Billow, MD 11/23/16 [phone] No Sent in Formalin ?? [id number]    11/23/16 1115    C Lung Right Tissue ?? SURGICAL PATHOLOGY EXAM   Janett Billow, MD 11/23/16 1003 Yes Frozen Section ?? [id number]    11/23/16 1011    D Lung Right Middle Tissue ?? SURGICAL PATHOLOGY EXAM   Janett Billow, MD 11/23/16 1029 No Sent in Formalin ?? [id number]    11/23/16 1115    E Lung Right Middle Tissue ?? SURGICAL PATHOLOGY EXAM   Janett Billow, MD 11/23/16 1042 No Sent in Formalin ?? [id number]    11/23/16 1115          Prior to leaving the room: Nurse confirmed name of procedure, completion of instrument, sponge & needle counts, reads specimen labels aloud including patient name and addresses any equipment issues? Nurse confirmed wound class. Nurse to surgeon and anesthesia: What are key concerns for recovery and management of the patient?  Yes      Blood products stored at appropriate temperatures prior to return to blood bank (if applicable)? N/A      Patient identification band secured on patient prior to transfer out of the operating room? Yes      Other Comments:     Signed: Conor Lata MARIE KELTY    Date: 11/23/2016    Time: 11:15 AM

## 2016-11-23 NOTE — Unmapped (Signed)
RIGHT SIDED VATS MIDDLE LOBE LOBECTOMY  Procedure Note    Joanna Lopez  11/23/2016      Pre-op Diagnosis: Lung nodule [R91.1]       Post-op Diagnosis: Right Middle Lobe Carcinoid    Procedure(s):  RIGHT SIDED VATS MIDDLE LOBE LOBECTOMY      Surgeon(s):  Janett Billow, MD    Anesthesia: General    Staff:   Circulator: Alfonzo Feller, RN  Relief Circulator: Genella Mech, RN  Scrub Person: Alberteen Spindle, CST  Fellow: Madalyn Rob, MD  2nd Scrub: Marilynne Halsted  Resident: Lillia Corporal, MD    Estimated Blood Loss: less than 50 mL                 Specimens:   Specimens     ID Description Commments Type Source Tests Collected By Collected At    A level 10 lymph node  Tissue Lung Right Middle ?? SURGICAL PATHOLOGY EXAM   Janett Billow, MD 11/23/16 [phone]    B level 11 lymph node  Tissue Lung Right Middle ?? SURGICAL PATHOLOGY EXAM   Janett Billow, MD 11/23/16 0911    C Right Middle Lobe  Tissue Lung Right ?? SURGICAL PATHOLOGY EXAM   Janett Billow, MD 11/23/16 1003    D level 7 lymph node  Tissue Lung Right Middle ?? SURGICAL PATHOLOGY EXAM   Janett Billow, MD 11/23/16 1029    E level 4R lymph node  Tissue Lung Right Middle ?? SURGICAL PATHOLOGY EXAM   Janett Billow, MD 11/23/16 1042                 Drains:   IUC (Foley) Non-latex;Straight-tip 16 Fr. (Active)   Number of days: 0         There were no complications unless listed below.        Madalyn Rob     Date: 11/23/2016  Time: 11:07 AM

## 2016-11-23 NOTE — Unmapped (Addendum)
[facility]  DEPARTMENT OF ANESTHESIOLOGY  PRE-PROCEDURAL EVALUATION    Joanna Lopez is a 66 y.o. year old female presenting for:    Procedure(s):  RIGHT SIDED VATS MIDDLE LOBE LOBECTOMY, POSSIBLE THORACOTOMY    Surgeon:   Janett Billow, MD    Chief Complaint     Lung nodule [R91.1]    Review of Systems   1. Cardiac risk/functional status: denies CAD/CHF. Denies h/o cardiac work-up. Denies CP, SOB and orthopnea. Duke activity score 4; she can walk 2 blocks, climb steps at home. She has been able to work in the yard as well. Pt endorses good functional status. Benign exam. No work-up indicated prior to OR. RCRI=1 (surgery).  ??  2. HTN: on lisinopril and HCTZ. BP today 142/71. Hold AM of OR.   ??  3. HLD: on statin. Continue perioperatively.   ??  4. OSA/obesity: s/p UPPP surgery, no CPAP.  Body mass index is 35.19 kg/m??.  ??  5. Lung nodule: to undergo (R) sided VATS middle lobe lobectomy, possible thoracotomy.   ??  6. GERD: no longer on esomeprazole.   ??  7. Migraines: last in the last couple weeks. Reports she takes Phenergan which helps but causes RLS to increase.   ??  8. RLS: no longer on Requip. Improved symptoms with massage.   ??  9. Carotid stenosis: 20-49% (L) ICA stenosis on carotid duplex from 2010. Pt has been on statin and is asymptomatic. No bruit on exam today.   Anesthesia Evaluation    Patient summary reviewed, nursing notes reviewed, CPC/PAT note reviewed and Previous anesthesia note reviewed.  All other systems reviewed and are negative.     I have reviewed the History and Physical Exam, any relevant changes are noted in the anesthesia pre-operative evaluation.      Cardiovascular:    Hypertension (On lis and HCTZ) is well controlled.    (-) valvular problems/murmurs, past MI, CAD, CHF, no hyperlipidemia.    Neuro/Muscoloskeletal/Psych:    (+) neuromuscular disease (< 50% LICA stenosis, RLS) and headaches (Migraines).    (-) seizures, TIA, CVA.     Pulmonary:    Sleep apnea (Not on cpap, s/p  UPP).    (-) COPD, asthma.       GI/Hepatic/Renal:    GERD is well controlled.    (-) liver disease, renal disease.    Endo/Other:        (-) diabetes mellitus, hypothyroidism.       Past Medical History     Past Medical History:   Diagnosis Date   ??? Basal cell cancer    ??? GERD (gastroesophageal reflux disease)    ??? Hyperlipidemia    ??? Hypertension    ??? IBS (irritable bowel syndrome)    ??? OSA (obstructive sleep apnea)        Past Surgical History     Past Surgical History:   Procedure Laterality Date   ??? APPENDECTOMY     ??? COLONOSCOPY N/A 01/23/2013    Procedure: COLONOSCOPY WITH MAC;  Surgeon: Lurene Shadow, MD;  Location: [facility];  Service: Gastroenterology;  Laterality: N/A;   ??? FLEXIBLE BRONCHOSCOPY W/ UPPER ENDOSCOPY N/A 09/17/2016    Procedure: BRONCHOSCOPY SUPER D WITH FLUORO AND BIOPSY-EBUS/NAVI;  Surgeon: Johnny Bridge, MD;  Location: [facility];  Service: Pulmonary;  Laterality: N/A;   ??? submastoid gland removal     ??? TONSILLECTOMY     ??? UVULOPALATOPHARYNGOPLASTY         Family History  Family History   Problem Relation Age of Onset   ??? Adopted: Yes   ??? Diabetes Son        Social History     Social History     Social History   ??? Marital status: Married     Spouse name: N/A   ??? Number of children: N/A   ??? Years of education: N/A     Occupational History   ??? Not on file.     Social History Main Topics   ??? Smoking status: Never Smoker   ??? Smokeless tobacco: Never Used   ??? Alcohol use Yes      Comment: 2-3 wine/week   ??? Drug use: No   ??? Sexual activity: Not on file     Other Topics Concern   ??? Caffeine Use No     1 can coke/day   ??? 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 Exercise No   ??? Seat Belt Yes     Social History Narrative   ??? No narrative on file       Medications     Allergies:  Allergies   Allergen Reactions   ??? Formaldehyde Analogues    ??? Penicillins    ??? Sulfa (Sulfonamide Antibiotics)    ??? Latex Rash     States she has a sensitivity to latex after being around  it for days, she gets a rash: she states that when she wore her cpap machine for 14 days she developed a rash on her face other than this she has no problems stating her dentist wears latex gloves and she has no problems with latex gloves.        Home Meds:  Prior to Admission medications as of 11/23/16 0558   Medication Sig Taking?   atorvastatin (LIPITOR) 10 MG tablet  Yes   citalopram (CELEXA) 10 MG tablet Take 10 mg by mouth daily. Yes   diclofenac (VOLTAREN) 75 MG EC tablet Take 1 tablet (75 mg total) by mouth 2 times a day. Yes   dicyclomine (BENTYL) 20 mg tablet Take 20 mg by mouth 4 times a day with meals and at bedtime. Yes   hydroCHLOROthiazide (HYDRODIURIL) 25 MG tablet  Yes   lisinopril (PRINIVIL,ZESTRIL) 10 MG tablet  Yes   melatonin 3 mg Tab Take 3 mg by mouth at bedtime. PRN for sleep Yes   oxymetazoline (AFRIN) 0.05 % nasal spray Use 2 sprays into each nostril 2 times a day. Nightly for sleep Yes   PARoxetine (PAXIL) 10 MG tablet Take 10 mg by mouth every morning. Yes   proMETHazine (PHENERGAN) 12.5 MG tablet Take 12.5 mg by mouth every 6 hours as needed for Nausea. For nausea associated with migraine Yes   esomeprazole (NEXIUM) 40 MG capsule Take 40 mg by mouth every morning before breakfast.    hyoscyamine (LEVSIN) 0.125 mg tablet Take 1 tablet (0.125 mg total) by mouth 2 times a day as needed for Cramping. Indications: DIARRHEA    lipase-protease-amylase (CREON) 24,000-76,000 -120,000 unit CpDR Take 2 capsules po with each meal.  Patient taking differently: Take 2 capsules po with each meal.  Taking PRN Indications: exocrine pancreatic insufficiency             multivitamin (MULTIVITAMIN) tablet Take 1 tablet by mouth daily.    nortriptyline (PAMELOR) 10 MG capsule Take 1 capsule (10 mg total) by mouth at bedtime.    roPINIRole (REQUIP) 0.25 MG tablet Take  1 tablet (0.25 mg total) by mouth daily with dinner. Take one tablet (0.25 mg) 1-2 hours before bedtime, for three days, then take 2 tablets  (0.5 mg) 1-2 hours before bedtime for four days, then take 4 tablets (1 mg) 1-2 hours before bedtime thereafter    UNABLE TO FIND Cholesterol medication , unknown to pt        Inpatient Meds:  Scheduled:   ??? heparin (porcine)  5,000 Units Subcutaneous Once     Continuous:   ??? bupivacaine (MARCAINE) 0.125% in sodium chloride 0.9% 250 mL epidural         PRN: lidocaine (PF) 2% (20 mg/mL), lidocaine (PF) 2% (20 mg/mL), vancomycin    Vital Signs     Wt Readings from Last 3 Encounters:   11/23/16 218 lb (98.9 kg)   11/10/16 218 lb (98.9 kg)   10/22/16 216 lb (98 kg)     Ht Readings from Last 3 Encounters:   11/23/16 5' 6 (1.676 m)   11/10/16 5' 6 (1.676 m)   10/22/16 5' 6 (1.676 m)     Temp Readings from Last 3 Encounters:   11/23/16 97.6 ??F (36.4 ??C) (Temporal)   11/10/16 97.8 ??F (36.6 ??C) (Oral)   09/17/16 98.8 ??F (37.1 ??C) (Oral)     BP Readings from Last 3 Encounters:   11/23/16 112/71   11/10/16 142/71   10/22/16 114/84     Pulse Readings from Last 3 Encounters:   11/23/16 68   11/10/16 67   10/22/16 80     @LASTSAO2 (3)@    Physical Exam     Airway:     Mallampati: II  Mouth Opening: >2 FB  TM distance: > = 3 FB  Neck ROM: full  (-) no facial hair, neck not short, not intubated      Dental:   - No obvious cracked, loose, chipped, or missing teeth.     Pulmonary:       Breath sounds clear to auscultation.       Cardiovascular:     Rhythm: regular  Rate: normal    Neuro/Musculoskeletal/Psych:    Mental status: alert and oriented to person, place and time.          Abdominal:     Obese.    Current OB Status:       Other Findings:        Laboratory Data     Lab Results   Component Value Date    WBC 10.2 11/10/2016    HGB 14.0 11/10/2016    HCT 40.1 11/10/2016    MCV 91.6 11/10/2016    PLT 181 11/10/2016       No results found for: [facility]    Lab Results   Component Value Date    GLUCOSE 88 11/10/2016    BUN 20 11/10/2016    CO2 27 11/10/2016    CREATININE 0.97 11/10/2016    K 4.0 11/10/2016    NA 143 11/10/2016    CL  108 11/10/2016    CALCIUM 9.4 11/10/2016    ALBUMIN 4.1 11/10/2016    ALBUMIN 4.1 11/10/2016    PROT 6.6 11/10/2016    ALKPHOS 63 11/10/2016    ALT 22 11/10/2016    AST 14 11/10/2016    BILITOT 0.5 11/10/2016       No results found for: PTT, INR    No results found for: PREGTESTUR, PREGSERUM, HCG, HCGQUANT    Anesthesia Plan     ASA  2       Female, current non-smoker and opiate use    Anesthesia Type:  general endotracheal.   Planned Special Techniques:  one lung ventilation.  (Anesthetic: GETA, DLT, OLV  Access: PIV(s)  Monitors: Standard ASA monitors, Arterial  line  Analgesia: Multimodal analgesia, Pre-op tylenol and gabapentin, celebrex, Epidural  Disposition: ICU postop  )    Intravenous induction.    Anesthetic plan and risks discussed with patient.    Plan, alternatives, and risks of anesthesia, including death, have been explained to and discussed with the patient/legal guardian.  By my assessment, the patient/legal guardian understands and agrees.  Scenario presented in detail.  Questions answered.    Use of blood products discussed with patient whom consented to blood products.   Plan discussed with resident and attending.        ANESTHESIOLOGY ATTENDING ADDENDUM:  I have interviewed and examined the patient prior to induction of anesthesia.  I reviewed the history and physical and the above note.  I agree with the findings, assessment, and plan with my addenda.    Exam:  MP 3, Mouth opening 2 FB, TMD 2 FB, Nl WOB, CTAB, RRR, Nl S1S2, No GRM    ASA 2 female for VATS RML resection    Plan:   Anesthetic:  GA  Analgesia:  Multimodal non-opioids with epidural and IV opioids as needed   Regional:  Epidural  PONV prophylaxis: 2-3 interventions  IV Access:  Adequate peripheral IVs   Monitors:  Standard monitors, A-line  Postoperative disposition: CVICU    [name] and/or appropriate family or DPOA verbalized understanding and agreed to the plan above.  This is a delayed-entry addendum.    Charlotte Sanes, M.D.  11/23/2016  3:25 PM

## 2016-11-23 NOTE — Unmapped (Signed)
Updated patient's husband about OR procedure at [phone].

## 2016-11-23 NOTE — Unmapped (Signed)
[facility]                      [facility]     PATIENT NAME:   Joanna Lopez, Joanna Lopez       MRN: [id number]  DATE OF BIRTH:  1950-08-19                     CSN: [id number]  SURGEON:        Janett Billow, M.D.           ADMIT DATE: 11/23/2016  SERVICE:        Surgery/Cardiovascular  DICTATED BY:    Janett Billow, M.D.           SURGERY DATE: 11/23/2016                                    OPERATIVE REPORT     PREOPERATIVE DIAGNOSIS(ES): Right middle lobe lung nodule.     POSTOPERATIVE DIAGNOSIS(ES): Right middle lobe lung nodule.     PROCEDURE(S) PERFORMED:  1.  Right thoracoscopic middle lobectomy.  2.  Hilar and mediastinal lymphadenectomy.     SURGEON:  Janett Billow, M.D.     RESIDENT SURGEON:  Margit Banda. Hollie Beach, M.D.     ANESTHESIA: General.     INDICATION(S): Joanna Lopez is a 66 year old female who has been followed  for an enlarging right middle lobe lung nodule.  This was originally found  incidentally.  PET scan showed a low-grade uptake with an SUV of 1.7.  PFTs  were adequate for pulmonary resection. She did have a bronchoscopy which  showed no malignancy in the lymph nodes and inflammatory changes in the  nodule.  We discussed the risks and benefits of proceeding with a right  middle lobectomy as this was too deep for a wedge resection and she consented  to proceed.     DETAILS OF PROCEDURE(S):  Joanna Lopez was brought to the operating room  where general anesthesia was induced.  Appropriate monitoring lines were  placed. She was intubated with a double lumen endotracheal tube.  Position  was confirmed with flexible bronchoscope.  She was positioned in the left  lateral decubitus position.  Her right chest was prepped and draped in the  usual sterile fashion.  A timeout was called to confirm the correct patient,  correct procedure and correct position.  Preoperative antibiotics and  subcutaneous heparin were given.  The right lung was  isolated by Anesthesia.  A 12 mm trocar was placed in the posterior ausculatory triangle in the sixth  intercostal space.  A second trocar was placed in the mid axillary line in  the sixth intercostal space.  A third trocar was placed in the posterior  axillary line in the eighth intercostal space.  All trocar sites were  infiltrated with 0.25% Marcaine prior to placement.  We did confirm the  nodule was in the right middle lobe and it was too deep for a wedge  resection.  Therefore, we proceeded with a lobectomy.  An access port was  made.  This was done by making a 6 cm incision in the fifth intercostal space  anterior to the latissimus dorsi muscle.  This was carried down through the  subcutaneous tissue with electrocautery. The serratus muscle fibers were  divided with electrocautery. The interspace was entered and a soft tissue  retractor was  placed.  At no time were the ribs spread.     The inferior pulmonary ligament was taken down. There were no level 9 lymph  nodes identified.  We then dissected the anterior hilum. There were 2 middle  lobe veins that were draining into the superior pulmonary vein. These were  individually dissected and ligated with an endovascular stapler.  The fissure  was nearly complete.  We exposed the pulmonary artery in the fissure.  Level  11 lymph nodes were resected en bloc and sent for permanent pathology. The  pulmonary artery appeared deeper in the fissure, therefore, we elected to  divide the bronchus first. The middle lobe bronchus was circumferentially  dissected.  This was divided with an endo GIA greenload stapler.  We  confirmed aeration to the lower lobe prior to dividing the bronchus. There  was 1 pulmonary artery branch that divided in 2.  We were able to get around  the common pulmonary artery branch to the middle lobe and this was divided  with an endovascular stapler.  There were additional level 11 lymph nodes  that were resected and sent for permanent pathology.   There was a second vein  that was draining from the superior pulmonary vein into the middle lobe and  this was circumferentially dissected and divided with an endovascular  stapler.  This left the minor fissure. The minor fissure was then completed  with an endo GIA greenload stapler.  The specimen was placed in a lap sac and  brought out through the access port.  Upon palpation, the specimen was  completely encompassed within the lobectomy specimen.  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     At this point, we completed our lymph node dissection.  The subcarinal space  was opened and subcarinal lymph node packet was resected en bloc and sent for  permanent pathology.  The paratracheal space was opened.  The paratracheal  lymph node packet was resected en bloc and sent for permanent pathology.  Hemostasis was ensured.  Tisseel fibrin sealant was placed on the staple  lines.  A single 28-French chest tube was placed anteriorly and apically. The  lung was reinflated and all trocars and the soft tissue retractor were  removed. The incisions were then closed in layers.  The serratus fascia was  reapproximated with a running 2-0 Vicryl.  The subcutaneous tissue was  brought together with a running 3-0 Vicryl. The skin was closed with a  running 4-0 Monocryl subcuticular stitch.  Dermabond was applied.  She  tolerated the procedure well. At the end of the procedure, all lap, needle  and instrument counts were correct.                                                Janett Billow, M.D.  SS/kh  D:  11/23/2016 11:14  T:  11/23/2016 15:17  Job #:  [id number]           OPERATIVE REPORT                                             PAGE    1 of   1

## 2016-11-23 NOTE — Unmapped (Addendum)
Insert Arterial Line  Date/Time: 11/23/2016 8:00 AM  Performed by: Felipa Emory  Authorized by: Albina Billet   Consent: Verbal consent obtained.  Risks and benefits: risks, benefits and alternatives were discussed  Consent given by: patient  Patient understanding: patient states understanding of the procedure being performed  Required items: required blood products, implants, devices, and special equipment available  Patient identity confirmed: verbally with patient, arm band and provided demographic data  Time out: Immediately prior to procedure a time out was called to verify the correct patient, procedure, equipment, support staff and site/side marked as required.  Preparation: Patient was prepped and draped in the usual sterile fashion.  Location: left radial  Anesthesia: see MAR for details    Sedation:  Patient sedated: yes  Analgesia: see MAR for details  Vitals: Vital signs were monitored during sedation.  Needle gauge: 20  Seldinger technique: Seldinger technique used  Ultrasound guidance used during line insertion: yes  Number of attempts: 3  Post-procedure: line sutured and dressing applied  Post-procedure CMS: normal  Complications: none

## 2016-11-23 NOTE — Procedures (Deleted)
Joanna Lopez is a 66 y.o. female patient.  1. Post-operative pain    2. Lung nodule      Past Medical History:   Diagnosis Date    Basal cell cancer     GERD (gastroesophageal reflux disease)     Hyperlipidemia     Hypertension     IBS (irritable bowel syndrome)     OSA (obstructive sleep apnea)      Blood pressure 112/71, pulse 68, temperature 97.6 F (36.4 C), temperature source Temporal, resp. rate 18, height 5' 6 (1.676 m), weight 218 lb (98.9 kg), SpO2 96 %.    Insert Arterial Line  Date/Time: 11/23/2016 8:28 AM  Performed by: Felipa Emory  Authorized by: Albina Billet   Consent: Verbal consent obtained. Written consent obtained.  Risks and benefits: risks, benefits and alternatives were discussed  Consent given by: patient  Patient understanding: patient states understanding of the procedure being performed  Patient consent: the patient's understanding of the procedure matches consent given  Procedure consent: procedure consent matches procedure scheduled  Relevant documents: relevant documents present and verified  Patient identity confirmed: verbally with patient and arm band  Time out: Immediately prior to procedure a time out was called to verify the correct patient, procedure, equipment, support staff and site/side marked as required.  Preparation: Patient was prepped and draped in the usual sterile fashion.  Indications: hemodynamic monitoring  Location: left radial  Anesthesia: local infiltration    Anesthesia:  Local Anesthetic: lidocaine 1% with epinephrine  Anesthetic total: 3 mL    Sedation:  Patient sedated: yes  Sedation type: anxiolysis  Sedatives: midazolam (2mg )  Analgesia: fentanyl (25 mcg)  Allen's test normal: yes  Needle gauge: 20  Seldinger technique: Seldinger technique used  Ultrasound guidance used during line insertion: yes  Number of attempts: 2  Post-procedure: line sutured  Post-procedure CMS: normal          [name]  [facility]  11/23/2016

## 2016-11-24 ENCOUNTER — Inpatient Hospital Stay: Admit: 2016-11-24 | Payer: MEDICARE

## 2016-11-24 ENCOUNTER — Inpatient Hospital Stay: Payer: MEDICARE

## 2016-11-24 LAB — CBC
Hematocrit: 36.4 % (ref 35.0–45.0)
Hemoglobin: 12 g/dL (ref 11.7–15.5)
MCH: 30.6 pg (ref 27.0–33.0)
MCHC: 32.9 g/dL (ref 32.0–36.0)
MCV: 93 fL (ref 80.0–100.0)
MPV: 9.2 fL (ref 7.5–11.5)
Platelets: 177 10E3/uL (ref 140–400)
RBC: 3.92 10E6/uL (ref 3.80–5.10)
RDW: 13.6 % (ref 11.0–15.0)
WBC: 15.2 10E3/uL — ABNORMAL HIGH (ref 3.8–10.8)

## 2016-11-24 LAB — BASIC METABOLIC PANEL
Anion Gap: 9 mmol/L (ref 3–16)
BUN: 26 mg/dL — ABNORMAL HIGH (ref 7–25)
CO2: 24 mmol/L (ref 21–33)
Calcium: 8.2 mg/dL — ABNORMAL LOW (ref 8.6–10.3)
Chloride: 107 mmol/L (ref 98–110)
Creatinine: 1.04 mg/dL (ref 0.60–1.30)
Glucose: 128 mg/dL — ABNORMAL HIGH (ref 70–100)
Osmolality, Calculated: 296 mosm/kg (ref 278–305)
Potassium: 4.3 mmol/L (ref 3.5–5.3)
Sodium: 140 mmol/L (ref 133–146)
eGFR AA CKD-EPI: 65 See note.
eGFR NONAA CKD-EPI: 56 See note.

## 2016-11-24 LAB — PHOSPHORUS: Phosphorus: 3 mg/dL (ref 2.1–4.5)

## 2016-11-24 LAB — CALCIUM: Calcium: 8.2 mg/dL — ABNORMAL LOW (ref 8.6–10.3)

## 2016-11-24 LAB — MAGNESIUM: Magnesium: 2.9 mg/dL — ABNORMAL HIGH (ref 1.5–2.5)

## 2016-11-24 MED ORDER — metoprolol tartrate (LOPRESSOR) injection 2.5 mg
5 | Freq: Four times a day (QID) | INTRAVENOUS | Status: AC
Start: 2016-11-24 — End: 2016-11-26
  Administered 2016-11-25 – 2016-11-26 (×5): 2.5 mg via INTRAVENOUS

## 2016-11-24 MED ORDER — methocarbamol (ROBAXIN) tablet 500 mg
500 | Freq: Four times a day (QID) | ORAL | Status: AC
Start: 2016-11-24 — End: 2016-11-26
  Administered 2016-11-25 – 2016-11-26 (×5): 500 mg via ORAL

## 2016-11-24 MED ORDER — methocarbamol (ROBAXIN) injection 500 mg
100 | Freq: Four times a day (QID) | INTRAMUSCULAR | Status: AC
Start: 2016-11-24 — End: 2016-11-24
  Administered 2016-11-24 – 2016-11-25 (×2): 500 mg via INTRAVENOUS

## 2016-11-24 MED ORDER — acetaminophen (TYLENOL) tablet 975 mg
325 | Freq: Three times a day (TID) | ORAL | Status: AC
Start: 2016-11-24 — End: 2016-11-26
  Administered 2016-11-24 – 2016-11-26 (×6): 975 mg via ORAL

## 2016-11-24 MED ORDER — roPINIRole (REQUIP) tablet 0.5 mg
0.25 | Freq: Once | ORAL | Status: AC
Start: 2016-11-24 — End: 2016-11-24
  Administered 2016-11-24: 18:00:00 0.5 mg via ORAL

## 2016-11-24 MED FILL — TYLENOL 325 MG TABLET: 325 325 mg | ORAL | Qty: 3

## 2016-11-24 MED FILL — ROBAXIN 100 MG/ML INJECTION SOLUTION: 100 100 mg/mL | INTRAMUSCULAR | Qty: 10

## 2016-11-24 MED FILL — AFRIN (OXYMETAZOLINE) 0.05 % NASAL SPRAY: 0.05 0.05 % | NASAL | Qty: 15

## 2016-11-24 MED FILL — KETOROLAC 15 MG/ML INJECTION SOLUTION: 15 15 mg/mL | INTRAMUSCULAR | Qty: 1

## 2016-11-24 MED FILL — PANTOPRAZOLE 40 MG TABLET,DELAYED RELEASE: 40 40 MG | ORAL | Qty: 1

## 2016-11-24 MED FILL — CREON 24,000-76,000-120,000 UNIT CAPSULE,DELAYED RELEASE: 24000-76000 24,000-76,000 -120,000 unit | ORAL | Qty: 2

## 2016-11-24 MED FILL — ATORVASTATIN 10 MG TABLET: 10 10 MG | ORAL | Qty: 1

## 2016-11-24 MED FILL — METHOCARBAMOL 500 MG TABLET: 500 500 MG | ORAL | Qty: 1

## 2016-11-24 MED FILL — THERA 400 MCG TABLET: 400 400 mcg | ORAL | Qty: 1

## 2016-11-24 MED FILL — NORTRIPTYLINE 10 MG CAPSULE: 10 10 MG | ORAL | Qty: 1

## 2016-11-24 MED FILL — PAROXETINE 10 MG TABLET: 10 10 MG | ORAL | Qty: 1

## 2016-11-24 MED FILL — ROPINIROLE 0.25 MG TABLET: 0.25 0.25 MG | ORAL | Qty: 2

## 2016-11-24 MED FILL — CITALOPRAM 20 MG TABLET: 20 20 MG | ORAL | Qty: 1

## 2016-11-24 MED FILL — DOCUSATE SODIUM 100 MG CAPSULE: 100 100 MG | ORAL | Qty: 1

## 2016-11-24 MED FILL — METOPROLOL TARTRATE 5 MG/5 ML INTRAVENOUS SOLUTION: 5 5 mg/5 mL | INTRAVENOUS | Qty: 5

## 2016-11-24 MED FILL — MELATONIN 3 MG TABLET: 3 3 mg | ORAL | Qty: 1

## 2016-11-24 MED FILL — HEPARIN (PORCINE) 5,000 UNIT/ML INJECTION SOLUTION: 5000 5,000 unit/mL | INTRAMUSCULAR | Qty: 1

## 2016-11-24 MED FILL — LIDODERM 5 % TOPICAL PATCH: 5 5 % | TOPICAL | Qty: 1

## 2016-11-24 NOTE — Unmapped (Signed)
Anesthesiology  Inpatient Pain Service  Progress Note    Date: 11/24/2016    Patient name: Joanna Lopez 66 y.o. female  DOB: [birth year]  MRN: [id number]      Procedure: RIGHT SIDED VATS MIDDLE LOBE LOBECTOMY,HILAR MEDIASTINAL LYMPH NODE DISSECTION (Right Chest)    Post-Operative Day: 1 Day Post-Op    Current Pain Management:   Epidural     Interval History: Thoracic epidural placed preoperatively yesterday. Toradol added to regimen postop. Still c/o shoulder pain, likely referred from chest tube.    Allergies:  Allergies   Allergen Reactions   ??? Formaldehyde Analogues    ??? Penicillins    ??? Sulfa (Sulfonamide Antibiotics)    ??? Latex Rash     States she has a sensitivity to latex after being around it for days, she gets a rash: she states that when she wore her cpap machine for 14 days she developed a rash on her face other than this she has no problems stating her dentist wears latex gloves and she has no problems with latex gloves.           Physical Exam:  General appearance: alert, well appearing, and in no distress  Catheter site: Non-tender and dressing intact and moves all extremities well  Neuro/Muscular skeletal: Grossly normal    Labs:  Lab Results   Component Value Date    WBC 15.2 (H) 11/24/2016    HGB 12.0 11/24/2016    HCT 36.4 11/24/2016    MCV 93.0 11/24/2016    PLT 177 11/24/2016     No results found for: INR, PROTIME    Plan:   Continue current epidural infusion.  Will add:  - Tylenol 975 mg PO q8H scheduled  - Robaxin 500 mg PO q6H scheduled    Will continue to follow.    Written by Patrick North, R.N., acting as a scribe for Dr. Rutherford Guys, Dr. Barry Dienes, and Dr. Lewis Shock  Inpatient Pain Service   Pager - [phone](PAIN)    ATTENDING NOTE:    I have supervised the resident in this assessment, reviewed their progress/procedure note and discussed care with them as entered/scribed by our Pain RN, Patrick North, RN for the resident.  I agree with their history, physical examination, assessment and  plan as noted above.    Anibal Henderson, MD  Anesthesiology

## 2016-11-24 NOTE — Unmapped (Signed)
Pt ambulated back from toilet without difficulty; w/ staff mgmt of equipment. Dumped moderate amt thin, opaque, pink fluid through chest tube. Outside of tube draining on skin; changed ABD  Pad that was collecting excess  Pink drainage.  Pt is denies pain; reinforced use Epidural

## 2016-11-24 NOTE — Unmapped (Signed)
Per chart review, patient was admitted on 11/23/16 with a primary diagnosis of lung nodule. Today she is post-op day 1 s/p right sided VATS middle lobectomy. Patient has chest tube in place and is on 1L O2. Discharge date and needs are unknown at this time. Sw will continue to follow along and remains available should any need arise.     Johna Sheriff, MSW, [place]  [phone]

## 2016-11-24 NOTE — Unmapped (Signed)
CRITICAL CARE ANESTHESIOLOGY TEAM NOTE    The following assessment and plan has been discussed in a collaborative fashion with [facility] faculty, Dr. Willeen Cass    Interval History   POD#1 from right VATs middle lobe lobectomy     IV metoprolol held overnight   Plan for the Day   Reduce IV metoprolol to 2.5mg  with hold parameters   Schedule tylenol   Encourage IS  Ambulate TID   D/c aline if NIBP MAPs correlate   Diagnosis, Assessment & Plan     Acute respiratory insufficiency:currently on 1L per NC  -Aggressive pulmonary toilet with IS  -OOB to chair and ambulate     Lung nodule: s/p right sided VATS middle lobe lobectomy on 7/16.   -continue CT to suction, CT output: 65/15/40 ( )  -reduce IV metoprolol to 2.5mg     OSA/obesity: s/p UPPP surgery, no CPAP.    HTN: on lisinopril and HCTZ. Hold anti-hypertensives  -continue scheduled IV metoprolol with hold parameters     Carotid stenosis:20-49% (L) ICA stenosis on carotid duplex from 2010. Pt has been on statin and is asymptomatic    HLD: at home on statin, continue     Depression/Anxiety: taking celexa and paxil at baseline.   -continue home medications     Pain: thoracic bupivicaine epidural at 4 ml/hr.   -continue toradol  -continue lidocaine patch   -add scheduled APAP     Migraines: previously has taken phenergan which helps but causes RLS symptoms to increase    RLS: previous on requip. No longer taking     Chronic Pancreatitis: continue home creon    GERD: taking nexium at home   -continue protonix     Leukocytosis: WBC 15.2. Likely reactive. Continue to monitor     Tubes / Lines / Drains:   Patient Lines/Drains/Airways Status    Active Epidural Line / PICC Line / PIV Line / ART Line / Line / CVC Line     Name:   Placement date:   Placement time:   Site:   Days:    Peripheral IV 11/23/16 Right Wrist  11/23/16    0750    Wrist    less than 1    Peripheral IV Left Wrist          Wrist        Arterial Line 11/23/16 Left Radial  11/23/16    0737    Radial    less than  1              DVT Prophylaxis: heparin subcutaneous   GI Prophylaxis: Protonix   Disposition: ICU status  Code Status: Full Code    Chief Complaint   Lung nodule     History   HPI:  Joanna Lopez is an 66 y.o. female seen on 11/24/2016. Pt admitted on 11/23/2016 with a primary diagnosis of Lung nodule.  Today is s/p right sided VATS middle lobectomy     OR course:  Received 1.2 L crystalloid. 185 ml UOP. 75 ml EBL    Airway: grade 1 view    Review of Systems:   Resp: denies SOB  Cardiac: denies chest pain, palpitations, swelling, dizziness/lightheadedness  GI: denies abdominal pain/pressure, denies distention  Neurological: denies headaches, unilateral weakness, vision changes, dizziness/lightheadedness.     c/o right shoulder pain     Past Medical History     Past Medical History:   Diagnosis Date   ??? Basal cell cancer    ??? GERD (  gastroesophageal reflux disease)    ??? Hyperlipidemia    ??? Hypertension    ??? IBS (irritable bowel syndrome)    ??? OSA (obstructive sleep apnea)        Past Surgical History     Past Surgical History:   Procedure Laterality Date   ??? APPENDECTOMY     ??? COLONOSCOPY N/A 01/23/2013    Procedure: COLONOSCOPY WITH MAC;  Surgeon: Lurene Shadow, MD;  Location: [facility] ENDOSCOPY;  Service: Gastroenterology;  Laterality: N/A;   ??? FLEXIBLE BRONCHOSCOPY W/ UPPER ENDOSCOPY N/A 09/17/2016    Procedure: BRONCHOSCOPY SUPER D WITH FLUORO AND BIOPSY-EBUS/NAVI;  Surgeon: Johnny Bridge, MD;  Location: [facility] ENDOSCOPY;  Service: Pulmonary;  Laterality: N/A;   ??? submastoid gland removal     ??? TONSILLECTOMY     ??? UVULOPALATOPHARYNGOPLASTY         Family History     Family History   Problem Relation Age of Onset   ??? Adopted: Yes   ??? Diabetes Son        Social History     Social History     Social History   ??? Marital status: Married     Spouse name: N/A   ??? Number of children: N/A   ??? Years of education: N/A     Occupational History   ??? Not on file.     Social History Main Topics   ??? Smoking status: Never  Smoker   ??? Smokeless tobacco: Never Used   ??? Alcohol use Yes      Comment: 2-3 wine/week   ??? Drug use: No   ??? Sexual activity: Not on file     Other Topics Concern   ??? Caffeine Use No     1 can coke/day   ??? 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 Exercise No   ??? Seat Belt Yes     Social History Narrative   ??? No narrative on file       Medications   Home Meds:  Prior to Admission medications    Medication Sig Start Date End Date Taking? Authorizing Provider   atorvastatin (LIPITOR) 10 MG tablet  08/17/16  Yes Historical Provider, MD   citalopram (CELEXA) 10 MG tablet Take 10 mg by mouth daily.   Yes Historical Provider, MD   diclofenac (VOLTAREN) 75 MG EC tablet Take 1 tablet (75 mg total) by mouth 2 times a day. 02/07/15  Yes Laverle Patter, MD   dicyclomine (BENTYL) 20 mg tablet Take 20 mg by mouth 4 times a day with meals and at bedtime.   Yes Historical Provider, MD   hydroCHLOROthiazide (HYDRODIURIL) 25 MG tablet  07/17/16  Yes Historical Provider, MD   lisinopril (PRINIVIL,ZESTRIL) 10 MG tablet  12/20/13  Yes Historical Provider, MD   melatonin 3 mg Tab Take 3 mg by mouth at bedtime. PRN for sleep   Yes Historical Provider, MD   oxymetazoline (AFRIN) 0.05 % nasal spray Use 2 sprays into each nostril 2 times a day. Nightly for sleep   Yes Historical Provider, MD   PARoxetine (PAXIL) 10 MG tablet Take 10 mg by mouth every morning.   Yes Historical Provider, MD   proMETHazine (PHENERGAN) 12.5 MG tablet Take 12.5 mg by mouth every 6 hours as needed for Nausea. For nausea associated with migraine   Yes Historical Provider, MD   esomeprazole (NEXIUM) 40 MG capsule Take 40 mg by mouth every morning before breakfast.  Historical Provider, MD   hyoscyamine (LEVSIN) 0.125 mg tablet Take 1 tablet (0.125 mg total) by mouth 2 times a day as needed for Cramping. Indications: DIARRHEA 07/21/13   Elias Else, CNP   lipase-protease-amylase (CREON) 24,000-76,000 -120,000 unit CpDR Take 2 capsules po  with each meal.  Patient taking differently: Take 2 capsules po with each meal.  Taking PRN Indications: exocrine pancreatic insufficiency          06/07/13   Elias Else, CNP   multivitamin (MULTIVITAMIN) tablet Take 1 tablet by mouth daily.    Historical Provider, MD   nortriptyline (PAMELOR) 10 MG capsule Take 1 capsule (10 mg total) by mouth at bedtime. 08/17/13   Elias Else, CNP   roPINIRole (REQUIP) 0.25 MG tablet Take 1 tablet (0.25 mg total) by mouth daily with dinner. Take one tablet (0.25 mg) 1-2 hours before bedtime, for three days, then take 2 tablets (0.5 mg) 1-2 hours before bedtime for four days, then take 4 tablets (1 mg) 1-2 hours before bedtime thereafter 06/08/13   Georgena Spurling, MD   UNABLE TO FIND Cholesterol medication , unknown to pt    Historical Provider, MD        Physical Exam   Temp:  [97.5 ??F (36.4 ??C)-99.4 ??F (37.4 ??C)] 99 ??F (37.2 ??C)  Heart Rate:  [62-80] 62  Resp:  [16-22] 20  BP: (111-131)/(57-76) 131/76  FiO2:  [94 %-100 %] 100 %     Constitutional: ?? Vital signs above   Eyes: ?? PERRLA  ?? Non-injected, anicteric   ENMT: ?? Moist mucous membranes without erythema or exudate in the oropharynx   Neck: ?? No masses, trachea midline   Respiratory: ?? Normal work of breathing  ?? Clear and diminished on right side to auscultation, no wheezes, crackles, or rhonchi  ?? Right chest incision with dermabond intact  ?? Right sided CT to 20 cm suction draining sang. Dg, small amount. No airleak   Cardiovascular: ?? Regular rate and rhythm  ?? No gallops, rubs, or murmurs  ?? DP / PT pulses 2+ and equal bilaterally  ?? No lower extremity edema   Gastrointestinal: ?? Non-tender, non-distended  ?? Normal/active BS x 4    Musculoskeletal: ?? Digits without clubbing, cyanosis, or edema   Neurologic: ?? Cranial nerves II-XII intact  ?? MAE Spontaneously, Equal strength bilaterally in all extremities   Psychiatric: ?? Oriented to person, place, and time   Skin: ?? Normal turgor  ?? No ecchymosis,  rashes, or lesions   Other ??      Joanna Smack, CNP  Critical Care Anesthesiology Team   [phone]  11/24/2016  6:53 AM

## 2016-11-24 NOTE — Unmapped (Signed)
Thoracic Surgery Progress Note    Patient: Joanna Lopez  Admit Date: 11/23/2016  OR Date: 11/23/2016    SUBJECTIVE   No acute events overnight.   Sitting up in chair at bedside    OBJECTIVE   Vitals:  Temp:  [97.5 ??F (36.4 ??C)-99.4 ??F (37.4 ??C)] 99 ??F (37.2 ??C)  Heart Rate:  [62-80] 62  Resp:  [16-22] 20  BP: (111-131)/(57-76) 131/76  FiO2:  [94 %-100 %] 100 %      Date 11/23/16 0700 - 11/24/16 0659 11/24/16 0700 - 11/25/16 0659   Shift 0700-1459 1500-2259 2300-0659 24 Hour Total 0700-1459 1500-2259 2300-0659 24 Hour Total   I  N  T  A  K  E   I.V.  (mL/kg) 1000  (10.1) 243.3  (2.5)  1243.3  (12.2)          Amt Infused (mL) (bupivacaine (MARCAINE) 0.125% in sodium chloride 0.9% 250 mL epidural)  41.3  41.3          Volume (mL) (electrolyte-R (pH 7.4) (NORMOSOL-R pH 7.4) iv solution SolP) 1000   1000          Volume (mL) (electrolyte-R (pH 7.4) (NORMOSOL-R pH 7.4) iv solution SolP)  202  202        IV Piggyback 100 400  500          Volume (mL) (magnesium sulfate in D5W 100 mL 1 gram/100 mL IVPB 1 g) 100 300  400          Volume (mL) (potassium chloride (KCl)/Sterile water 100 mL 10 mEq/100 mL IVPB 10 mEq)  100  100        Shift Total  (mL/kg) 1100  (11.1) 643.3  (6.5)  1743.3  (17.1)       O  U  T  P  U  T   Urine  (mL/kg/hr) 355  (0.4) 400  (0.5) 465  (0.6) 1220  (0.5)          Urine 185   185          Output (mL) ([REMOVED] IUC (Foley) Non-latex;Straight-tip 16 Fr.) 130   130          Output (mL) (IUC (Foley)) 40 400 465 905        Chest Tube 65 15 15 95 25   25      Output (mL) (Chest Tube Right Midaxillary 28 Fr.) 65 15 15 95 25   25    Shift Total  (mL/kg) 420  (4.2) 415  (4.2) 480  (4.7) 1315  (12.9) 25  (0.2)   25  (0.2)   Weight (kg) 98.9 98.9 102.2 102.2 102.2 102.2 102.2 102.2       Physical Exam:  Gen: NAD, A&Ox3, seen and examined sitting up in chair  CV: RRR  Resp: No respiratory distress, satting well on 1 LPM nasal cannula, chest tube to suction with no air leak and ss output over 24  hours  Abd: Soft, NT/ND  Ext: warm and well perfused, SCDs in place  GU: foley in place draining clear yellow urine  Wound: Right port incisions to chest intact with dermabond, no drainage    Labs:  Recent Labs      11/23/16   1019  11/23/16   1154  11/24/16   0541   WBC   --   12.7*  15.2*   HGB  13.2  13.1  12.0   HCT  40.5  37.7  36.4   PLT   --   169  177     Recent Labs      11/23/16   1154  11/24/16   0541   NA  139  140   K  3.9  4.3   CL  107  107   CO2  23  24   BUN  24  26*   CREATININE  1.00  1.04   GLUCOSE  158*  128*   CALCIUM  8.2*   8.2*  8.2*   8.2*   MG  1.7  2.9*   PHOS  3.1  3.0     No results for input(s): POCGLU, POCGMD in the last 72 hours.    Invalid input(s): GLU  No results for input(s): AST, ALT, BILITOT, BILIDIRECT, ALKPHOS, ALBUMIN, GGT, AMYLASE, LIPASE in the last 72 hours.  No results for input(s): INR, PROTIME in the last 72 hours.    Imaging Studies:  Exam: XR PORTABLE CHEST   ??  Date: 11/24/2016 4:53 AM EDT  ??  CLINICAL HISTORY: Line Placement;  post op lung surgery;   ??  TECHNIQUE: Portable AP view of the chest.  ??  COMPARISON: 11/23/2016   ??  FINDINGS:  ??  Medical Devices: Right-sided chest tube, unchanged.  ??  Heart and Mediastinum: Unchanged.  ??  Lungs and Pleura: Trace right apical pneumothorax. Persistent bilateral mid and lower lung zone patchy parenchymal opacities.  ??  Bones and Soft Tissues: Unchanged.  ??  ??  IMPRESSION:     Trace right apical pneumothorax with right chest tube in place.  ??  Persistent bilateral mid and lower lung zone airspace disease.  Current Medications:  Scheduled Meds:  ??? acetaminophen  975 mg Oral Q8H   ??? atorvastatin  10 mg Oral Nightly (2100)   ??? citalopram  10 mg Oral Daily 0900   ??? docusate sodium  100 mg Oral BID   ??? heparin (porcine)  5,000 Units Subcutaneous 3 times per day   ??? ketorolac  15 mg Intravenous 4 times per day   ??? lidocaine  1 patch Transdermal Q24H   ??? lipase-protease-amylase  2 capsule Oral 4x Daily WC   ??? melatonin  3 mg Oral  Nightly (2100)   ??? methocarbamol  500 mg Intravenous Q6H   ??? metoprolol tartrate  2.5 mg Intravenous Q6H   ??? multivitamin with folic acid  1 tablet Oral Daily 0900   ??? nortriptyline  10 mg Oral Nightly (2100)   ??? oxymetazoline  2 spray Nasal BID   ??? pantoprazole  40 mg Oral DAILY 0600   ??? PARoxetine  10 mg Oral QAM     Continuous Infusions:  ??? bupivacaine (MARCAINE) 0.125% in sodium chloride 0.9% 250 mL epidural 4 mL/hr at 11/23/16 1200     PRN Meds: ICU electrolyte replacement protocol, proMETHazine    ASSESSMENT/PLAN   Joanna Lopez is a 66 y.o. female with right middle lobe lung nodule, POD #1 s/p right VATS middle lobectomy.     PLAN:  Final pathology pending  Afebrile, periop antibiotics completed,  no s/s of infection  Chest tube to water seal  CXR on water seal at 1300  Satting well, wean nasal cannula as tolerated  Regular diet  IS/OOB/Ambulate/pulmonary toilet  DC foley      PAIN:  Pain moderately well controlled, shoulder pain controlled, continue current pain management of epidural  PRN toradol  Scheduled tylenol  Lidocaine patch  Continue  CMU    Carotid stenosis/HLD:  Home statin    Depression/anxiety:  Home celexa  Home paxil    Chronic pancreatitis:  Home creon    HYPERTENSION:  Controlled  DC a line  MIVF off  Home antihypertensives on hold  Metoprolol with hold parameters, reduced to 2.5mg  today      PROPHY:  Subcutaneous heparin, SCD's, bowel regimen      DISPO:    CVICU    Doreene Adas APRN-CNP  Division of Thoracic Surgery  [facility]  Pager: thoracic pager    Final plan per attending, attestation to follow

## 2016-11-25 ENCOUNTER — Inpatient Hospital Stay: Admit: 2016-11-25 | Payer: MEDICARE

## 2016-11-25 LAB — CBC
Hematocrit: 38.5 % (ref 35.0–45.0)
Hemoglobin: 13.2 g/dL (ref 11.7–15.5)
MCH: 31.8 pg (ref 27.0–33.0)
MCHC: 34.3 g/dL (ref 32.0–36.0)
MCV: 92.6 fL (ref 80.0–100.0)
MPV: 9.3 fL (ref 7.5–11.5)
Platelets: 163 10*3/uL (ref 140–400)
RBC: 4.15 10*6/uL (ref 3.80–5.10)
RDW: 13.8 % (ref 11.0–15.0)
WBC: 12.6 10*3/uL (ref 3.8–10.8)

## 2016-11-25 LAB — URINALYSIS W/RFL TO MICROSCOPIC
Bilirubin, UA: NEGATIVE
Blood, UA: NEGATIVE
Glucose, UA: NEGATIVE mg/dL
Ketones, UA: NEGATIVE mg/dL
Nitrite, UA: NEGATIVE
Protein, UA: NEGATIVE mg/dL
RBC, UA: 1 /HPF (ref 0–3)
Specific Gravity, UA: 1.023 (ref 1.005–1.035)
Squam Epithel, UA: 3 /HPF (ref 0–5)
Urobilinogen, UA: <2 mg/dL (ref 0.2–1.9)
WBC, UA: 7 /HPF (ref 0–5)
pH, UA: 5 (ref 5.0–8.0)

## 2016-11-25 LAB — BASIC METABOLIC PANEL
Anion Gap: 6 mmol/L (ref 3–16)
BUN: 27 mg/dL (ref 7–25)
CO2: 24 mmol/L (ref 21–33)
Calcium: 8.6 mg/dL (ref 8.6–10.3)
Chloride: 110 mmol/L (ref 98–110)
Creatinine: 1 mg/dL (ref 0.60–1.30)
Glucose: 109 mg/dL (ref 70–100)
Osmolality, Calculated: 296 mOsm/kg (ref 278–305)
Potassium: 4.1 mmol/L (ref 3.5–5.3)
Sodium: 140 mmol/L (ref 133–146)
eGFR AA CKD-EPI: 68 See note.
eGFR NONAA CKD-EPI: 59 See note.

## 2016-11-25 LAB — MAGNESIUM: Magnesium: 2.3 mg/dL (ref 1.5–2.5)

## 2016-11-25 LAB — PHOSPHORUS: Phosphorus: 2.6 mg/dL (ref 2.1–4.5)

## 2016-11-25 MED ORDER — melatonin Tab 3 mg
3 | Freq: Every evening | ORAL | Status: AC
Start: 2016-11-25 — End: 2016-11-26
  Administered 2016-11-25 – 2016-11-26 (×2): 3 mg via ORAL

## 2016-11-25 MED FILL — KETOROLAC 15 MG/ML INJECTION SOLUTION: 15 15 mg/mL | INTRAMUSCULAR | Qty: 1

## 2016-11-25 MED FILL — TYLENOL 325 MG TABLET: 325 325 mg | ORAL | Qty: 3

## 2016-11-25 MED FILL — BUPIVACAINE (PF) 0.75 % (7.5 MG/ML) INJECTION SOLUTION: 0.75 0.75 % (7.5 mg/mL) | INTRAMUSCULAR | Qty: 41.65

## 2016-11-25 MED FILL — CITALOPRAM 20 MG TABLET: 20 20 MG | ORAL | Qty: 1

## 2016-11-25 MED FILL — THERA 400 MCG TABLET: 400 400 mcg | ORAL | Qty: 1

## 2016-11-25 MED FILL — CREON 24,000-76,000-120,000 UNIT CAPSULE,DELAYED RELEASE: 24000-76000 24,000-76,000 -120,000 unit | ORAL | Qty: 2

## 2016-11-25 MED FILL — METHOCARBAMOL 500 MG TABLET: 500 500 MG | ORAL | Qty: 1

## 2016-11-25 MED FILL — AFRIN (OXYMETAZOLINE) 0.05 % NASAL SPRAY: 0.05 0.05 % | NASAL | Qty: 15

## 2016-11-25 MED FILL — METOPROLOL TARTRATE 5 MG/5 ML INTRAVENOUS SOLUTION: 5 5 mg/5 mL | INTRAVENOUS | Qty: 5

## 2016-11-25 MED FILL — HEPARIN (PORCINE) 5,000 UNIT/ML INJECTION SOLUTION: 5000 5,000 unit/mL | INTRAMUSCULAR | Qty: 1

## 2016-11-25 MED FILL — LIDODERM 5 % TOPICAL PATCH: 5 5 % | TOPICAL | Qty: 1

## 2016-11-25 MED FILL — PANTOPRAZOLE 40 MG TABLET,DELAYED RELEASE: 40 40 MG | ORAL | Qty: 1

## 2016-11-25 MED FILL — PAROXETINE 10 MG TABLET: 10 10 MG | ORAL | Qty: 1

## 2016-11-25 MED FILL — MELATONIN 3 MG TABLET: 3 3 mg | ORAL | Qty: 1

## 2016-11-25 MED FILL — DOCUSATE SODIUM 100 MG CAPSULE: 100 100 MG | ORAL | Qty: 1

## 2016-11-25 MED FILL — ATORVASTATIN 10 MG TABLET: 10 10 MG | ORAL | Qty: 1

## 2016-11-25 NOTE — Unmapped (Signed)
In discussion with the primary team it does not appear that the patient will currently require continued [facility] involvement at this time. Please do not hesitate to re-engage [facility] should any questions or concerns arise. Thank you for this interesting consult.    Ariyon Mittleman, CNP  Critical Care Anesthesia Team  [phone]

## 2016-11-25 NOTE — Unmapped (Addendum)
Thoracic Surgery Progress Note    Patient: Joanna Lopez  Admit Date: 11/23/2016  OR Date: 11/23/2016    SUBJECTIVE   There were no acute events overnight.  Tolerating a normal diet.  Pain well controlled on current regimen. Still having moderate shoulder pain, likely from chest tube.       OBJECTIVE   Vitals:  Temp:  [97.6 ??F (36.4 ??C)-98.5 ??F (36.9 ??C)] 97.6 ??F (36.4 ??C)  Heart Rate:  [58-81] 61  Resp:  [16-24] 18  BP: (100-148)/(56-90) 141/82      Date 11/24/16 0700 - 11/25/16 0659 11/25/16 0700 - 11/26/16 0659   Shift 0700-1459 1500-2259 2300-0659 24 Hour Total 0700-1459 1500-2259 2300-0659 24 Hour Total   I  N  T  A  K  E   P.O. 180  120 300 240   240      P.O. 180  120 300 240   240    I.V.  (mL/kg)  145  (1.4)  145  (1.4) 58.9  (0.6)   58.9  (0.6)      Amt Infused (mL) (bupivacaine (MARCAINE) 0.125% in sodium chloride 0.9% 250 mL epidural)  145  145 58.9   58.9    IV Piggyback     5   5      Volume (mL) (metoprolol tartrate (LOPRESSOR) injection 2.5 mg)     5   5    Shift Total  (mL/kg) 180  (1.8) 145  (1.4) 120  (1.2) 445  (4.4) 303.9  (3)   303.9  (3)   O  U  T  P  U  T   Urine  (mL/kg/hr) 85  (0.1)  430  (0.5) 515  (0.2) 450   450      Urine   430 430 450   450      Urine Occurrence  1 x  1 x          Output (mL) ([REMOVED] IUC (Foley)) 85   85        Chest Tube 115 30 110 255 100   100      Output (mL) ([REMOVED] Chest Tube Right Midaxillary 28 Fr.) 115 30 110 255 100   100    Shift Total  (mL/kg) 200  (2) 30  (0.3) 540  (5.3) 770  (7.5) 550  (5.4)   550  (5.4)   Weight (kg) 102.2 102.2 102.2 102.2 102.2 102.2 102.2 102.2       Physical Exam:  Gen: NAD, A&Ox3, seen and examined sitting up in bed.   CV: RRR  Resp: No respiratory distress, satting well on 1 LPM nasal cannula, chest tube to water seal with no air leak and ss output over 24 hours  Abd: Soft, NT/ND  Ext: warm and well perfused, SCDs in place  GU: Clear, yellow urine  Wound: Right port incisions to chest intact with dermabond, no  drainage.  R chest tube site intact with minimal drainage. Removed during exam.    Labs:  Recent Labs      11/23/16   1154  11/24/16   0541  11/25/16   0912   WBC  12.7*  15.2*  12.6*   HGB  13.1  12.0  13.2   HCT  37.7  36.4  38.5   PLT  169  177  163     Recent Labs      11/23/16   1154  11/24/16   0541  11/25/16   0912   NA  139  140  140   K  3.9  4.3  4.1   CL  107  107  110   CO2  23  24  24    BUN  24  26*  27*   CREATININE  1.00  1.04  1.00   GLUCOSE  158*  128*  109*   CALCIUM  8.2*   8.2*  8.2*   8.2*  8.6   MG  1.7  2.9*  2.3   PHOS  3.1  3.0  2.6       Imaging Studies:  Exam: XR PORTABLE CHEST   ??  Date: 11/24/2016 4:53 AM EDT  ??  CLINICAL HISTORY: Line Placement;  post op lung surgery;   ??  TECHNIQUE: Portable AP view of the chest.  ??  COMPARISON: 11/23/2016   ??  FINDINGS:  ??  Medical Devices: Right-sided chest tube, unchanged.  ??  Heart and Mediastinum: Unchanged.  ??  Lungs and Pleura: Trace right apical pneumothorax. Persistent bilateral mid and lower lung zone patchy parenchymal opacities.  ??  Bones and Soft Tissues: Unchanged.  ??  ??  IMPRESSION:     Trace right apical pneumothorax with right chest tube in place.  ??  Persistent bilateral mid and lower lung zone airspace disease.    Current Medications:  Scheduled Meds:  ??? acetaminophen  975 mg Oral Q8H   ??? atorvastatin  10 mg Oral Nightly (2100)   ??? citalopram  10 mg Oral Daily 0900   ??? docusate sodium  100 mg Oral BID   ??? heparin (porcine)  5,000 Units Subcutaneous 3 times per day   ??? ketorolac  15 mg Intravenous 4 times per day   ??? lidocaine  1 patch Transdermal Q24H   ??? lipase-protease-amylase  2 capsule Oral 4x Daily WC   ??? melatonin  3 mg Oral Nightly (2100)   ??? methocarbamol  500 mg Oral Q6H   ??? metoprolol tartrate  2.5 mg Intravenous Q6H   ??? multivitamin with folic acid  1 tablet Oral Daily 0900   ??? nortriptyline  10 mg Oral Nightly (2100)   ??? oxymetazoline  2 spray Nasal BID   ??? pantoprazole  40 mg Oral DAILY 0600   ??? PARoxetine  10 mg Oral QAM      Continuous Infusions:  ??? bupivacaine (MARCAINE) 0.125% in sodium chloride 0.9% 250 mL epidural 4 mL/hr at 11/25/16 0725     PRN Meds: ICU electrolyte replacement protocol, proMETHazine    ASSESSMENT/PLAN   Relda Agosto is a 66 y.o. female with right middle lobe lung nodule, POD #2 s/p right VATS middle lobectomy.     PLAN:  Final pathology pending  Afebrile, periop antibiotics completed,  no s/s of infection  Chest tube removed today  Satting well, wean nasal cannula as tolerated  Regular diet  IS/OOB/Ambulate/pulmonary toilet    PAIN:  Pain moderately well controlled, shoulder pain controlled.  D/c epidural today.    PRN toradol  Scheduled tylenol  Lidocaine patch  Continue CMU until moves to floor    Carotid stenosis/HLD:  Home statin    Depression/anxiety:  Home celexa  Home paxil    Chronic pancreatitis:  Home creon    HYPERTENSION:  Controlled  MIVF off  Home antihypertensives on hold  Metoprolol with hold parameters, reduced to 2.5mg  yesterday    PROPHY:  Subcutaneous heparin, SCD's, bowel regimen    DISPO:  Okay to move to floor today    Madalyn Rob, MD  Cardiothoracic Surgery Resident Physician  [phone] (c), [phone] (p)

## 2016-11-25 NOTE — Unmapped (Signed)
Epidural catheter removed with blue tip intact at 1150.   - Patient tolerated well. Site clean, dry, and intact.  - Please hold anticoagulation: 1 hour for SC Heparin and 4 hours SC Lovenox.    - RN notified of plan?  Yes  - Further pain management per primary service.

## 2016-11-25 NOTE — Unmapped (Signed)
Anesthesiology  Inpatient Pain Service  Progress Note    Date: 11/25/2016    Patient name: Joanna Lopez 66 y.o. female  DOB: 1950/05/30  MRN: [id number]      Procedure: RIGHT SIDED VATS MIDDLE LOBE LOBECTOMY,HILAR MEDIASTINAL LYMPH NODE DISSECTION (Right Chest)    Post-Operative Day: 2 Days Post-Op    Current Pain Management:   Epidural     Interval History: Robaxin and Tylenol added to regimen yesterday. Pain well-controlled this AM.    Allergies:  Allergies   Allergen Reactions   ??? Formaldehyde Analogues    ??? Penicillins    ??? Sulfa (Sulfonamide Antibiotics)    ??? Latex Rash     States she has a sensitivity to latex after being around it for days, she gets a rash: she states that when she wore her cpap machine for 14 days she developed a rash on her face other than this she has no problems stating her dentist wears latex gloves and she has no problems with latex gloves.           Physical Exam:  General appearance: alert, well appearing, and in no distress  Catheter site: Non-tender and dressing intact and moves all extremities well  Neuro/Muscular skeletal: Grossly normal    Labs:  Lab Results   Component Value Date    WBC 15.2 (H) 11/24/2016    HGB 12.0 11/24/2016    HCT 36.4 11/24/2016    MCV 93.0 11/24/2016    PLT 177 11/24/2016     No results found for: INR, PROTIME    Plan:   Will contact primary team to coordinate epidural removal, likely after CT is discontinued.    Will follow.    Written by Patrick North, R.N., acting as a scribe for Dr. Rutherford Guys, Dr. Barry Dienes, and Dr. Lewis Shock  Inpatient Pain Service   Pager - [phone](PAIN)    ATTENDING NOTE:    I have supervised the resident in this assessment, reviewed their progress/procedure note and discussed care with them as entered/scribed by our Pain RN, Patrick North, RN for the resident.  I agree with their history, physical examination, assessment and plan as noted above.    Anibal Henderson, MD  Anesthesiology

## 2016-11-25 NOTE — Unmapped (Signed)
Clinical Pharmacy Note:  Medication Reconciliation  Upon review of patient's home and inpatient medications, discovered some discrepencies and duplication of therapy.  After interview with patient and review of patient's recent fill history with her pharmacy, most current list of home medication is below:    Atorvastatin 10 mg daily  HCTZ 25 mg daily  Creon 24000 units TID with meals  Lisinopril 20 mg tablets (take 1/2 tablet daily)  Paroxetine 10 mg daily  Promethazine 12.5 mg PRN  Dicyclomine 20 mg QID PRN  Melatonin 3 mg QHS  Oxymetazoline BID  Multivitamin daily  Ropinirole 0.25 mg every evening    Note that patient is no longer taking:  Diclofenac, esomeprazole, hyoscyamine, citalopram, or nortriptyline.  Removed these medications from medication reconciliation form completed at admission.  Removed citalopram and nortriptyline from patient's active inpatient medication list.  Discussed with MD, Olivia Canter.  Please call with questions.     Malvika Tung E Robi Dewolfe, Pharm.D., BCPS  Pager [phone]  On call pager [phone]  3:21 PM  11/25/2016

## 2016-11-25 NOTE — Unmapped (Signed)
End of shift:  Pt's pain was well controlled all night. Pt slept poorly r/t equipment it feels like the tube is poking me from the inside and I can't roll to my  Left side  To sleep  Napped after getting melatonin.  C/o urgency and feelings of bladder fullness immediately after voiding; will ask team for UA labs.   Pt is progressing post -op and able to ambulate without O2 and without dyspnea upon exertion.  Abd pads for drainage under chest tube were only changed once this shift.  Pt smiling this morning, sitting in chair and ready to order [id number]

## 2016-11-26 LAB — RENAL FUNCTION PANEL W/EGFR
Albumin: 3.3 g/dL (ref 3.5–5.7)
Anion Gap: 6 mmol/L (ref 3–16)
BUN: 28 mg/dL (ref 7–25)
CO2: 23 mmol/L (ref 21–33)
Calcium: 8.3 mg/dL (ref 8.6–10.3)
Chloride: 111 mmol/L (ref 98–110)
Creatinine: 1.13 mg/dL (ref 0.60–1.30)
Glucose: 103 mg/dL (ref 70–100)
Osmolality, Calculated: 296 mOsm/kg (ref 278–305)
Phosphorus: 3.5 mg/dL (ref 2.1–4.5)
Potassium: 4.1 mmol/L (ref 3.5–5.3)
Sodium: 140 mmol/L (ref 133–146)
eGFR AA CKD-EPI: 59 See note.
eGFR NONAA CKD-EPI: 51 See note.

## 2016-11-26 LAB — CBC
Hematocrit: 34.9 % — ABNORMAL LOW (ref 35.0–45.0)
Hemoglobin: 12.2 g/dL (ref 11.7–15.5)
MCH: 32.1 pg (ref 27.0–33.0)
MCHC: 34.9 g/dL (ref 32.0–36.0)
MCV: 92.1 fL (ref 80.0–100.0)
MPV: 9.1 fL (ref 7.5–11.5)
Platelets: 148 10E3/uL (ref 140–400)
RBC: 3.79 10E6/uL — ABNORMAL LOW (ref 3.80–5.10)
RDW: 13.6 % (ref 11.0–15.0)
WBC: 10 10E3/uL (ref 3.8–10.8)

## 2016-11-26 LAB — MAGNESIUM: Magnesium: 2.1 mg/dL (ref 1.5–2.5)

## 2016-11-26 MED ORDER — pantoprazole (PROTONIX) 40 MG tablet
40 | ORAL_TABLET | Freq: Every day | ORAL | 0 refills | Status: AC
Start: 2016-11-26 — End: 2017-02-19

## 2016-11-26 MED ORDER — metoprolol tartrate (LOPRESSOR) 25 MG tablet
25 | ORAL_TABLET | Freq: Two times a day (BID) | ORAL | 0 refills | Status: AC
Start: 2016-11-26 — End: 2017-02-19

## 2016-11-26 MED ORDER — metoprolol tartrate (LOPRESSOR) partial tablet 6.25 mg
12.5 | Freq: Two times a day (BID) | ORAL | Status: AC
Start: 2016-11-26 — End: 2016-11-26

## 2016-11-26 MED ORDER — potassium chloride (KLOR-CON M20) CR tablet 20 mEq
20 | Freq: Once | ORAL | Status: AC
Start: 2016-11-26 — End: 2016-11-26
  Administered 2016-11-26: 11:00:00 20 meq via ORAL

## 2016-11-26 MED ORDER — metoprolol tartrate (LOPRESSOR) partial tablet 12.5 mg
12.5 | Freq: Two times a day (BID) | ORAL | Status: AC
Start: 2016-11-26 — End: 2016-11-26

## 2016-11-26 MED ORDER — methocarbamol (ROBAXIN) 500 MG tablet
500 | ORAL_TABLET | Freq: Four times a day (QID) | ORAL | 0 refills | Status: AC
Start: 2016-11-26 — End: 2017-02-19

## 2016-11-26 MED ORDER — calcium carbonate (OS-CAL) tablet 1250 mg (500 mg elemental calcium)
500 | Freq: Once | ORAL | Status: AC
Start: 2016-11-26 — End: 2016-11-26
  Administered 2016-11-26: 11:00:00 5000 mg via ORAL

## 2016-11-26 MED ORDER — docusate sodium (COLACE) 100 MG capsule
100 | ORAL_CAPSULE | Freq: Two times a day (BID) | ORAL | 0 refills | Status: AC
Start: 2016-11-26 — End: 2017-02-19

## 2016-11-26 MED ORDER — magnesium oxide (MAG-OX) tablet 400 mg
400 | Freq: Two times a day (BID) | ORAL | Status: AC
Start: 2016-11-26 — End: 2016-11-26
  Administered 2016-11-26: 12:00:00 400 mg via ORAL

## 2016-11-26 MED ORDER — acetaminophen (TYLENOL) 325 MG tablet
325 | ORAL_TABLET | Freq: Four times a day (QID) | ORAL | 0 refills | 11.00000 days | Status: AC | PRN
Start: 2016-11-26 — End: ?

## 2016-11-26 MED FILL — METOPROLOL TARTRATE 5 MG/5 ML INTRAVENOUS SOLUTION: 5 5 mg/5 mL | INTRAVENOUS | Qty: 5

## 2016-11-26 MED FILL — PANTOPRAZOLE 40 MG TABLET,DELAYED RELEASE: 40 40 MG | ORAL | Qty: 1

## 2016-11-26 MED FILL — OYSTER SHELL CALCIUM 500  500 MG CALCIUM (1,250 MG) TABLET: 500 500 mg calcium (1,250 mg) | ORAL | Qty: 4

## 2016-11-26 MED FILL — THERA 400 MCG TABLET: 400 400 mcg | ORAL | Qty: 1

## 2016-11-26 MED FILL — TYLENOL 325 MG TABLET: 325 325 mg | ORAL | Qty: 3

## 2016-11-26 MED FILL — HEPARIN (PORCINE) 5,000 UNIT/ML INJECTION SOLUTION: 5000 5,000 unit/mL | INTRAMUSCULAR | Qty: 1

## 2016-11-26 MED FILL — KLOR-CON M20 MEQ TABLET,EXTENDED RELEASE: 20 20 mEq | ORAL | Qty: 1

## 2016-11-26 MED FILL — MAGNESIUM OXIDE 400 MG (241.3 MG MAGNESIUM) TABLET: 400 400 mg | ORAL | Qty: 1

## 2016-11-26 MED FILL — KETOROLAC 15 MG/ML INJECTION SOLUTION: 15 15 mg/mL | INTRAMUSCULAR | Qty: 1

## 2016-11-26 MED FILL — DOCUSATE SODIUM 100 MG CAPSULE: 100 100 MG | ORAL | Qty: 1

## 2016-11-26 NOTE — Unmapped (Signed)
Thoracic Surgery Progress Note    Patient: Joanna Lopez  Admit Date: 11/23/2016  OR Date: 11/23/2016    SUBJECTIVE   There were no acute events overnight.    OBJECTIVE   Vitals:  Temp:  [97.9 ??F (36.6 ??C)-98.8 ??F (37.1 ??C)] 98.3 ??F (36.8 ??C)  Heart Rate:  [57-80] 80  Resp:  [16-24] 18  BP: (92-147)/(64-95) 142/90  FiO2:  [100 %] 100 %      Date 11/25/16 0700 - 11/26/16 0659 11/26/16 0700 - 11/27/16 0659   Shift 0700-1459 1500-2259 2300-0659 24 Hour Total 0700-1459 1500-2259 2300-0659 24 Hour Total   I  N  T  A  K  E   P.O. 240   240 120   120      P.O. 240   240 120   120    I.V.  (mL/kg) 58.9  (0.6)   58.9  (0.6)          Amt Infused (mL) (bupivacaine (MARCAINE) 0.125% in sodium chloride 0.9% 250 mL epidural) 58.9   58.9        IV Piggyback 5   5          Volume (mL) (metoprolol tartrate (LOPRESSOR) injection 2.5 mg) 5   5        Shift Total  (mL/kg) 303.9  (3)   303.9  (3) 120  (1.2)   120  (1.2)   O  U  T  P  U  T   Urine  (mL/kg/hr) 550  (0.7) 250  (0.3)  800  (0.3) 300   300      Urine 550 250  800 300   300      Urine Occurrence 1 x 1 x 2 x 4 x        Stool              Stool Occurrence 1 x  1 x 2 x        Chest Tube 100   100          Output (mL) ([REMOVED] Chest Tube Right Midaxillary 28 Fr.) 100   100        Shift Total  (mL/kg) 650  (6.4) 250  (2.4)  900  (8.8) 300  (2.9)   300  (2.9)   Weight (kg) 102.2 102.2 102.2 102.2 102.5 102.5 102.5 102.5       Physical Exam:  Gen: NAD, A&Ox3, seen and examined sitting up in bed.   CV: RRR  Resp: No respiratory distress, satting well on room air, chest tube removed  Abd: Soft, NT/ND  Ext: warm and well perfused, SCDs in place  GU: Clear, yellow urine  Wound: Right port incisions to chest intact with dermabond, no drainage.      Labs:  Recent Labs      11/24/16   0541  11/25/16   0912  11/26/16   0541   WBC  15.2*  12.6*  10.0   HGB  12.0  13.2  12.2   HCT  36.4  38.5  34.9*   PLT  177  163  148     Recent Labs      11/24/16   0541  11/25/16   0912   11/26/16   0541   NA  140  140  140   K  4.3  4.1  4.1   CL  107  110  111*   CO2  24  24  23   BUN  26*  27*  28*   CREATININE  1.04  1.00  1.13   GLUCOSE  128*  109*  103*   CALCIUM  8.2*   8.2*  8.6  8.3*   MG  2.9*  2.3  2.1   PHOS  3.0  2.6  3.5       Imaging Studies:  Exam: XR PORTABLE CHEST   ??  Date: 11/25/2016 4:21 AM EDT  ??  CLINICAL HISTORY: Other abnormalities of breathing;  chest tube in place eval for pneumothorax;   ??  TECHNIQUE: Portable AP view of the chest.  ??  COMPARISON: 11/24/2016   ??  FINDINGS:  ??  Medical Devices: Right-sided chest tube, unchanged.  ??  Heart and Mediastinum: Unchanged.  ??  Lungs and Pleura: Lung volumes are low. Minimal bibasilar opacities. Right midlung linear parenchymal opacity, likely subsegmental atelectasis and/or pneumonia. No definite pneumothorax visualized.  ??  Bones and Soft Tissues: Unchanged.  ??  ??  IMPRESSION:     No definite pneumothorax with right-sided chest tube in place.  ??  Minimal bibasilar atelectasis.    Current Medications:  Scheduled Meds:  ??? acetaminophen  975 mg Oral Q8H   ??? atorvastatin  10 mg Oral Nightly (2100)   ??? docusate sodium  100 mg Oral BID   ??? heparin (porcine)  5,000 Units Subcutaneous 3 times per day   ??? lidocaine  1 patch Transdermal Q24H   ??? lipase-protease-amylase  2 capsule Oral 4x Daily WC   ??? magnesium oxide  400 mg Oral BID   ??? melatonin  3 mg Oral Nightly (2100)   ??? methocarbamol  500 mg Oral Q6H   ??? metoprolol tartrate  2.5 mg Intravenous Q6H   ??? multivitamin with folic acid  1 tablet Oral Daily 0900   ??? pantoprazole  40 mg Oral DAILY 0600   ??? PARoxetine  10 mg Oral QAM     Continuous Infusions:  ??? bupivacaine (MARCAINE) 0.125% in sodium chloride 0.9% 250 mL epidural Stopped (11/25/16 1230)     PRN Meds: ICU electrolyte replacement protocol, proMETHazine    ASSESSMENT/PLAN   Joanna Lopez is a 66 y.o. 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      PLAN:  Final pathology:     A. ?? Lymph node, level 10, excision:  ???? ?? - One benign lymph node with reactive change (0/1).  ???? ?? - Negative for atypia or malignancy.    B. ?? Lymph node, level 11, excision:  ???? ?? - Two benign lymph nodes with reactive change (0/2).  ???? ?? - Negative for atypia or malignancy.    C. ?? Lung, right middle lobe, lobectomy:  - Well differentiated neuroendocrine tumor, Grade 1 (Typical carcinoid  tumor; 2 cm greatest dimension).  -Pathologic tumor stage: ??pT1b, pN0, pMX.  - Ki-67 index is 2%; mitosis is less than 2 per two millimeter square.  - No evidence of necrosis.  ???? ?? ?? ?? ??- Margin of resection is free of tumor.  ???? ?? ?? ?? ??- Please see Synoptic Report.  ???? ?? - Background lung parenchyma shows emphysematous change.  ???? ?? - Six benign lymph nodes with reactive change (0/6).    D. ?? Lymph node, level 7, excision:  ???? ?? - Two benign lymph nodes with reactive change (0/2).  ???? ?? - Negative for metastatic disease.  E. ?? Lymph node, level 4R, excision:  ???? ?? - One benign lymph node with reactive change(0/1).  ???? ?? - Negative for metastatic disease.      SYNOPTIC REPORT  Procedure  Lobectomy  Specimen Laterality  Right  Tumor Site  Right middle lobe  Tumor Size  Greatest dimension (centimeters): ??2 x 1.5 x 1 cm  Tumor Focality  Single focus  Histologic Type  Typical carcinoid tumor  Histologic Grade  G1: Well differentiated  Spread Through Air Spaces (STAS)  Not identified  Visceral Pleura Invasion  Not identified  Lymphovascular Invasion  Not identified  Direct Invasion of Adjacent Structures  No adjacent structures present  Margins  All margins are uninvolved by tumor  ???? ?? Margins examined (specify): ??Bronchial  ???? ?? Distance of invasive carcinoma from closest margin (centimeters): ??1.3  cm  Treatment Effect  No known presurgical therapy  Regional Lymph Nodes  Lymph Node Examination  Number of Lymph Nodes Involved: ??0  Number of Lymph Nodes Examined: ??12 as follows: ??Level 10 - 0/1,  level 11 -  0/2, level 7 - 0/2, level 4R - 0/1 and peribronchial - 0/6.  Pathologic Stage Classification (pTNM, AJCC 8th Edition)  Primary Tumor (pT)  pT1b: ?? ?? ?? ?? ??Tumor >1 cm, but less than or equal to 2 cm in greatest  dimension  Regional Lymph Nodes (pN)  pN0: No regional lymph node metastasis  Distant Metastasis (pM)  pMX  Additional Pathologic Findings  Emphysematous change  Ancillary Studies  Tumor cells are positive for synaptophysin and chromogranin and show a  Ki-67 index is 2%, findings support typical carcinoid tumor. ??There is no  evidence of necrosis or increased mitosis or atypia.    Comment(s): ??Patient's history of enlarging right middle lobe nodule is  noted. The sections show well ciecumscribed neuroendocrine tumor  (synaptophysin and chromogranin positive ) with less than 2 mitosis per 2  millimeter square, ki-67 index of 2% and does not show areas of necrosis.  The histologic and immunphenotypic features are of typical carcinoid tumor  (Grade1).    This case has been prospectively reviewed within the Department.  Afebrile, periop antibiotics completed,  no s/s of infection  Chest tube removed today  Satting well, wean nasal cannula as tolerated  Regular diet  IS/OOB/Ambulate/pulmonary toilet    PAIN:  Pain well controlled, epidural removed    Scheduled tylenol  Lidocaine patch  Continue CMU until moves to floor    Carotid stenosis/HLD:  Home statin    Depression/anxiety:  Home celexa  Home paxil    Chronic pancreatitis:  Home creon    HYPERTENSION:  Controlled  MIVF off  Home antihypertensives on hold  Metoprolol with hold parameters, transition to oral formulation    PROPHY:  Subcutaneous heparin, SCD's, bowel regimen    DISPO:    Home today    Doreene Adas APRN-CNP  Division of Thoracic Surgery  [employer] [facility]  Pager: [phone] (6am-2PM)    Final plan per attending, attestation to follow    This progress note was copied forward from the note written by J. Hance MD on  7/18.  I have reviewed and updated the history, physical exam, data, assessment, and plan of the note so that it reflects the evaluation and management of the patient on 7/19

## 2016-11-26 NOTE — Unmapped (Signed)
Problem: Acute Pain  Patient's pain progressing toward patient's stated pain goal   Goal: Patient displays improved well-being such as baseline levels for pulse, BP, respirations and relaxed muscle tone or body posture  Outcome: Progressing    Goal: Patients pain is managed to allow active participation in daily activities  Outcome: Progressing    Goal: Patient verbalizes a reduction in pain level  Outcome: Progressing

## 2016-11-26 NOTE — Unmapped (Signed)
Division of Thoracic Surgery  [facility]  [address]  [place], [place]  [address]  Office Phone: [phone]    Janett Billow, MD  Erasmo Score, MD    Patient Discharge Instructions    ??? No bathing or using pools or hot tubs for two weeks following your surgery; you may shower. Allow soapy water to run over the wound and pat dry.     ??? No lifting of anything greater than 20 lbs until your first post-operative visit. At your office appointment, please discuss lifting restrictions with your physician.    ??? No driving until your post-operative visit or while using narcotic pain medication.    ??? No airline flying until your first post-operative office visit.    ??? We recommend that you follow a regular diet unless you were told otherwise.  Should you experience constipation, please try over-the-counter medications including Miralax or Metamucil per package directions and contact our office should the condition worsen.    ??? We recommend you follow up with your primary care physician within one month of your surgery.    ??? Please call the office if you are experiencing any of the following: a fever greater than 101 degrees, increased pain, increased shortness of breath, wound drainage or redness.    ??? Please be aware that narcotic pain medications are strictly regulated by the state of [place].  You should wean narcotic pain medication over the next 2 weeks at home and transition to over the counter pain control.  Narcotics will not be refilled over the phone. Please call the office to have prescriptions refilled; please allow at least two days; please call between the hours of 8:00am and 5:00pm weekdays for prescription refills; prescriptions will not be refilled on weekends or evenings.     ??? **If you are going home with a pain catheter, this may fall out on it's own or you may call the office to have it removed in 2-3 days.       ??? **Please expect yellow or pink drainage from the chest  tube site after the chest tube is removed and then again after the chest tube stitch is removed. Please call the office if the drainage lasts longer than two days and if the drainage has any color change or turns bloody.  Your chest tube stitch will be removed at your clinic follow up appointment    ??? **If you were told to obtain a chest x-ray prior to your follow up, please do so at the [facility] Imaging Department on the 2nd floor of the [facility] the same day of your post-operative office visit (please arrive 15 minutes prior to your post-op appointment for the chest X-ray).     ??? Please call the office with any additional questions or concerns; if you have an emergency, please call 911.    We attempt to have your follow up appointments scheduled prior to discharge. Your follow up appointments that are currently scheduled are as follows:    Future Appointments  Future Appointments  Date Time Provider Department Center   12/10/2016 11:30 AM Janett Billow, MD [facility]         If your appointments are not yet scheduled or if you have a question about your appointments, please call at [phone] or [phone].

## 2016-11-26 NOTE — Unmapped (Signed)
Physician Discharge Summary     Patient ID:  Joanna Lopez  [id number]  04/27/1951    Admit date: 11/23/2016    Discharge date: 11/26/2016    Attending Physician: Janett Billow, MD     Admission Diagnoses:   Lung nodule [R91.1]    Discharge Diagnoses:   Principal Problem:    Lung nodule  Active Problems:    Mass of middle lobe of right lung    Acute respiratory insufficiency    Essential hypertension    Pain management    Past Medical History:   Diagnosis Date   ??? Basal cell cancer    ??? GERD (gastroesophageal reflux disease)    ??? Hyperlipidemia    ??? Hypertension    ??? IBS (irritable bowel syndrome)    ??? OSA (obstructive sleep apnea)        Indication for Admission: Joanna Lopez is a 66 y.o. with right middle lobe lung nodule.    Operations/Procedures Performed:   1.  Right thoracoscopic middle lobectomy.  2.  Hilar and mediastinal lymphadenectomy.    Hospital Course:  Joanna Lopez was admitted on 11/23/2016 and underwent the above mentioned procedure(s) on 11/23/2016.  She tolerated the procedure well with no complications.  Please see full operative report for further details regarding the operation.  Final pathology demonstrated a neuroendocrine carcinoid tumor, which will be discussed in full detail with the patient at follow up visit.      Postoperatively, the patient was transferred to  CVICU in stable condition.  Her pain controlled post-operatively with epidural and PCA.  Her chest tube remained on suction until POD #1 when the chest tube was water sealed.  The chest tube was removed after trial of water seal revealed no air leaks on examination or pneumothorax on CXR on POD #2.  The epidural and PCA were discontinued as well on POD#2. The foley catheter was removed on POD #1 and the patient voided spontaneously.     Diet was advanced in the immediate post-operative period and patient tolerated this well.  At time of discharge, the patient was tolerating oral food and hydration, voiding  spontaneously, was ambulating without difficulty, and pain was controlled on oral medications. The patient was determined to be suitable for discharge and the patient felt comfortable with that decision.  Patient was discharged in good condition.    Consults: Cardiovascular Critical Care Team, Acute Pain Service      Significant Diagnostic Studies:   Exam: XR PORTABLE CHEST   ??  Date: 11/25/2016 4:21 AM EDT  ??  CLINICAL HISTORY: Other abnormalities of breathing;  chest tube in place eval for pneumothorax;   ??  TECHNIQUE: Portable AP view of the chest.  ??  COMPARISON: 11/24/2016   ??  FINDINGS:  ??  Medical Devices: Right-sided chest tube, unchanged.  ??  Heart and Mediastinum: Unchanged.  ??  Lungs and Pleura: Lung volumes are low. Minimal bibasilar opacities. Right midlung linear parenchymal opacity, likely subsegmental atelectasis and/or pneumonia. No definite pneumothorax visualized.  ??  Bones and Soft Tissues: Unchanged.  ??  ??  IMPRESSION:     No definite pneumothorax with right-sided chest tube in place.  ??  Minimal bibasilar atelectasis.    Discharge Exam:  Blood pressure 142/90, pulse 80, temperature 98.3 ??F (36.8 ??C), temperature source Oral, resp. rate 18, height 5' 6 (1.676 m), weight (!) 226 lb (102.5 kg), SpO2 93 %.    Gen - NAD, AOx3  CV - RRR  Resp - CTAB  Abd - soft, NT/ND, inc c/d/i  Ext - warm, well-perfused    Disposition: Discharged home in good condition    Discharge Medications:  Current Discharge Medication List      START taking these medications    Details   acetaminophen (TYLENOL) 325 MG tablet Take 3 tablets (975 mg total) by mouth every 6 hours as needed.  Qty: 60 tablet, Refills: 0      docusate sodium (COLACE) 100 MG capsule Take 1 capsule (100 mg total) by mouth 2 times a day.  Qty: 30 capsule, Refills: 0    Comments: While on narcotic pain medication  Associated Diagnoses: Lung nodule, solitary      methocarbamol (ROBAXIN) 500 MG tablet Take 1 tablet (500 mg total) by mouth every 6  hours.  Qty: 45 tablet, Refills: 0      metoprolol tartrate (LOPRESSOR) 25 MG tablet Take 0.5 tablets (12.5 mg total) by mouth 2 times a day.  Qty: 13 tablet, Refills: 0    Comments: Take half tablet (12.5mg ) twice daily for 7 days then take half tablet (12.5mg ) once daily for 7 days take 1/2 tablet (12.5mg ) every other day for 7 days then stop.      pantoprazole (PROTONIX) 40 MG tablet Take 1 tablet (40 mg total) by mouth daily.  Qty: 30 tablet, Refills: 0    Associated Diagnoses: Lung nodule, solitary         CONTINUE these medications which have NOT CHANGED    Details   atorvastatin (LIPITOR) 10 MG tablet       citalopram (CELEXA) 10 MG tablet Take 10 mg by mouth daily.      dicyclomine (BENTYL) 20 mg tablet Take 20 mg by mouth 4 times a day with meals and at bedtime.      hydroCHLOROthiazide (HYDRODIURIL) 25 MG tablet       lisinopril (PRINIVIL,ZESTRIL) 20 MG tablet Take 10 mg by mouth daily. 10 mg (half tablet) daily      melatonin 3 mg Tab Take 3 mg by mouth at bedtime. PRN for sleep      oxymetazoline (AFRIN) 0.05 % nasal spray Use 2 sprays into each nostril 2 times a day. Nightly for sleep      PARoxetine (PAXIL) 10 MG tablet Take 10 mg by mouth every morning.      proMETHazine (PHENERGAN) 12.5 MG tablet Take 12.5 mg by mouth every 6 hours as needed for Nausea. For nausea associated with migraine      lipase-protease-amylase (CREON) 24,000-76,000 -120,000 unit CpDR Take 2 capsules po with each meal.  Qty: 180 capsule, Refills: 6    Associated Diagnoses: Diarrhea      multivitamin (MULTIVITAMIN) tablet Take 1 tablet by mouth daily.      roPINIRole (REQUIP) 0.25 MG tablet Take 1 tablet (0.25 mg total) by mouth daily with dinner. Take one tablet (0.25 mg) 1-2 hours before bedtime, for three days, then take 2 tablets (0.5 mg) 1-2 hours before bedtime for four days, then take 4 tablets (1 mg) 1-2 hours before bedtime thereafter  Qty: 95 tablet, Refills: 5         STOP taking these medications       esomeprazole  (NEXIUM) 40 MG capsule Comments:   Reason for Stopping:               Patient Instructions:      Division of Thoracic Surgery  [facility]  [address], [address]  [place], [place]  [address]  Office Phone: [phone]    Janett Billow, MD  Erasmo Score, MD    Patient Discharge Instructions    ??? No bathing or using pools or hot tubs for two weeks following your surgery; you may shower. Allow soapy water to run over the wound and pat dry.     ??? No lifting of anything greater than 20 lbs until your first post-operative visit. At your office appointment, please discuss lifting restrictions with your physician.    ??? No driving until your post-operative visit or while using narcotic pain medication.    ??? No airline flying until your first post-operative office visit.    ??? We recommend that you follow a regular diet unless you were told otherwise.  Should you experience constipation, please try over-the-counter medications including Miralax or Metamucil per package directions and contact our office should the condition worsen.    ??? We recommend you follow up with your primary care physician within one month of your surgery.    ??? Please call the office if you are experiencing any of the following: a fever greater than 101 degrees, increased pain, increased shortness of breath, wound drainage or redness.    ??? Please be aware that narcotic pain medications are strictly regulated by the state of [place].  You should wean narcotic pain medication over the next 2 weeks at home and transition to over the counter pain control.  Narcotics will not be refilled over the phone. Please call the office to have prescriptions refilled; please allow at least two days; please call between the hours of 8:00am and 5:00pm weekdays for prescription refills; prescriptions will not be refilled on weekends or evenings.     ??? **If you are going home with a pain catheter, this may fall out on it's own or you may call the office to  have it removed in 2-3 days.       ??? **Please expect yellow or pink drainage from the chest tube site after the chest tube is removed and then again after the chest tube stitch is removed. Please call the office if the drainage lasts longer than two days and if the drainage has any color change or turns bloody.  Your chest tube stitch will be removed at your clinic follow up appointment    ??? **If you were told to obtain a chest x-ray prior to your follow up, please do so at the [facility] Department on the 2nd floor of the [facility] the same day of your post-operative office visit (please arrive 15 minutes prior to your post-op appointment for the chest X-ray).     ??? Please call the office with any additional questions or concerns; if you have an emergency, please call 911.    We attempt to have your follow up appointments scheduled prior to discharge. Your follow up appointments that are currently scheduled are as follows:    Future Appointments  Future Appointments  Date Time Provider Department Center   12/10/2016 11:30 AM Janett Billow, MD [facility]         If your appointments are not yet scheduled or if you have a question about your appointments, please call at [phone] or [phone].       Doreene Adas APRN-CNP  Division of Thoracic Surgery  [facility]

## 2016-11-26 NOTE — Care Coordination-Inpatient (Signed)
Per Thoracic Surgery, patient will discharge home today, independent.     Met with patient at bedside and reviewed the IMM. Patient denied having any questions or concerns regarding her discharge plans. Patient signed IMM letter. Original provided to patient and a copy was placed in bedside chart.     Johna Sheriff, MSW, [place]  [phone]

## 2016-12-04 NOTE — Unmapped (Signed)
The patient had surgery on 7/16. The arm that she had the two IV'S in really aches

## 2016-12-05 ENCOUNTER — Inpatient Hospital Stay: Admit: 2016-12-05 | Discharge: 2016-12-05 | Disposition: A | Discharge status: Home / Self Care | Payer: MEDICARE

## 2016-12-05 DIAGNOSIS — R202 Paresthesia of skin: Secondary | ICD-10-CM

## 2016-12-05 NOTE — Unmapped (Signed)
Pt is 2 weeks post-op and is c/o left forearm pain where an IV was located.

## 2016-12-05 NOTE — Unmapped (Signed)
You were evaluated in the Emergency Department today for your left wrist burning sensation and pain.  This sounds to be due to an irritated nerve and possibly inflammation.    Your physical examination thus far has been reassuring and we feel that you are safe to be discharged to home.    For your symptoms, we recommend a conservative approach including NSAIDs and ice.  You can take up to 600 mg of ibuprofen every 6 hours.  You can also apply ice packs for 20 minutes at a time 3-4 times a day.  These 2 measures will help with inflammation and swelling, which will hopefully reduce your symptoms.    If they do not help, I want you to you talk to your cardiothoracic surgeon about your symptoms when you see her on Thursday.  Do not miss this appointment.  You can also follow-up with your primary care provider, if preferred.  There are medicines available to help with nerve pain.  They include gabapentin and Lyrica.  Talk to your doctors about these medications if the ibuprofen and ice therapy does not work.    If you develop worsening symptoms, or develop chest pain, trouble breathing, fever greater than 101F, uncontrollable vomiting, or other concerning symptoms, you should return to the emergency department.    Future Appointments  Date Time Provider Department Center   12/10/2016 11:30 AM Janett Billow, MD [facility]

## 2016-12-05 NOTE — Unmapped (Signed)
[place] ED Encounter      Reason for Visit: Arm Pain      Patient History     HPI: Joanna Lopez is a 66 y.o. female with prior medical history significant for recently diagnosed lung cancer with lobectomy approximately 2 weeks ago who presents to the Emergency Department with several days of left forearm pain and burning.  The patient reports having both an IV and an A-line placed in her left wrist for her recent surgery.  Several days ago, the patient spontaneously developed a burning sensation in her left wrist which has not gone away.  The patient reports an associated pain which travels distally down her left thumb and proximally to her left mid forearm.  She denies any trauma or falls to have caused her symptoms.  She denies any numbness or weakness associated with her symptoms.  She denies prior history symptoms.  For this burning sensation and pain, the patient has been taking methocarbamol which has not really helped.  She has not taken any other medications.  Other than this forearm pain and burning, the patient states she has been otherwise well.  She is right-hand dominant. She denies any pain other than the pain in her left upper extremity.  She denies associated chest pain or trouble breathing.  She denies recent fever or chills.  She denies loss of appetite, nausea, vomiting, or diarrhea.  The patient reports that her surgery went well.  She has a follow-up appointment with her cardiothoracic surgeon next week.  She has not yet seen an oncologist.    Past Medical History:   Diagnosis Date   ??? Basal cell cancer    ??? GERD (gastroesophageal reflux disease)    ??? Hyperlipidemia    ??? Hypertension    ??? IBS (irritable bowel syndrome)    ??? OSA (obstructive sleep apnea)        Past Surgical History:   Procedure Laterality Date   ??? APPENDECTOMY     ??? COLONOSCOPY N/A 01/23/2013    Procedure: COLONOSCOPY WITH MAC;  Surgeon: Lurene Shadow, MD;   Location[facility] ENDOSCOPY;  Service: Gastroenterology;  Laterality: N/A;   ??? FLEXIBLE BRONCHOSCOPY W/ UPPER ENDOSCOPY N/A 09/17/2016    Procedure: BRONCHOSCOPY SUPER D WITH FLUORO AND BIOPSY-EBUS/NAVI;  Surgeon: Johnny Bridge, MD;  Location: [facility] ENDOSCOPY;  Service: Pulmonary;  Laterality: N/A;   ??? submastoid gland removal     ??? THORACOSCOPY Right 11/23/2016    Procedure: RIGHT SIDED VATS MIDDLE LOBE LOBECTOMY,HILAR MEDIASTINAL LYMPH NODE DISSECTION;  Surgeon: Janett Billow, MD;  Location: [facility] OR;  Service: Thoracic;  Laterality: Right;   ??? TONSILLECTOMY     ??? UVULOPALATOPHARYNGOPLASTY         Social History  THERMA LASURE  reports that she has never smoked. She has never used smokeless tobacco. She reports that she drinks alcohol. She reports that she does not use drugs.    Previous Medications    ACETAMINOPHEN (TYLENOL) 325 MG TABLET    Take 3 tablets (975 mg total) by mouth every 6 hours as needed.    ATORVASTATIN (LIPITOR) 10 MG TABLET        CITALOPRAM (CELEXA) 10 MG TABLET    Take 10 mg by mouth daily.    DICYCLOMINE (BENTYL) 20 MG TABLET    Take 20 mg by mouth 4 times a day with meals and at bedtime.    DOCUSATE SODIUM (COLACE) 100 MG CAPSULE    Take 1 capsule (100 mg  total) by mouth 2 times a day.    HYDROCHLOROTHIAZIDE (HYDRODIURIL) 25 MG TABLET        LIPASE-PROTEASE-AMYLASE (CREON) 24,000-76,000 -120,000 UNIT CPDR    Take 2 capsules po with each meal.    LISINOPRIL (PRINIVIL,ZESTRIL) 20 MG TABLET    Take 10 mg by mouth daily. 10 mg (half tablet) daily    MELATONIN 3 MG TAB    Take 3 mg by mouth at bedtime. PRN for sleep    METHOCARBAMOL (ROBAXIN) 500 MG TABLET    Take 1 tablet (500 mg total) by mouth every 6 hours.    METOPROLOL TARTRATE (LOPRESSOR) 25 MG TABLET    Take 0.5 tablets (12.5 mg total) by mouth 2 times a day.    MULTIVITAMIN (MULTIVITAMIN) TABLET    Take 1 tablet by mouth daily.    OXYMETAZOLINE (AFRIN) 0.05 % NASAL SPRAY    Use 2 sprays into each nostril 2 times a day. Nightly for sleep     PANTOPRAZOLE (PROTONIX) 40 MG TABLET    Take 1 tablet (40 mg total) by mouth daily.    PAROXETINE (PAXIL) 10 MG TABLET    Take 10 mg by mouth every morning.    PROMETHAZINE (PHENERGAN) 12.5 MG TABLET    Take 12.5 mg by mouth every 6 hours as needed for Nausea. For nausea associated with migraine    ROPINIROLE (REQUIP) 0.25 MG TABLET    Take 1 tablet (0.25 mg total) by mouth daily with dinner. Take one tablet (0.25 mg) 1-2 hours before bedtime, for three days, then take 2 tablets (0.5 mg) 1-2 hours before bedtime for four days, then take 4 tablets (1 mg) 1-2 hours before bedtime thereafter       Allergies:   Allergies as of 12/05/2016 - Fully Reviewed 12/05/2016   Allergen Reaction Noted   ??? Formaldehyde analogues  07/21/2012   ??? Penicillins  07/21/2012   ??? Sulfa (sulfonamide antibiotics)  07/21/2012   ??? Latex Rash 12/03/2011       Review of Systems     Review of Systems   Constitutional: Negative for chills, diaphoresis and fever.   HENT: Negative for congestion and sore throat.    Eyes: Negative for blurred vision, double vision and photophobia.   Respiratory: Negative for cough and shortness of breath.    Cardiovascular: Negative for chest pain.   Gastrointestinal: Negative for abdominal pain, nausea and vomiting.   Genitourinary: Negative for flank pain.   Musculoskeletal: Positive for joint pain. Negative for back pain, falls, myalgias and neck pain.   Skin: Negative for itching and rash.   Neurological: Positive for sensory change (burning sensation). Negative for dizziness, tingling, tremors, speech change, focal weakness, seizures, loss of consciousness and headaches.       Physical Exam     ED Triage Vitals [12/05/16 1449]   Vital Signs Group      Temp 98.4 ??F (36.9 ??C)      Temp Source Oral      Heart Rate 75      Heart Rate Source Monitor;Automatic      Resp 18      SpO2 99 %      BP 115/82      MAP (mmHg)       BP Location Right arm      BP Method Automatic      Patient Position Sitting   SpO2 99 %    O2 Device None (Room air)   Afebrile, all vital signs  within normal limits     Physical Exam   Constitutional: She is well-developed, well-nourished, and in no distress.   HENT:   Head: Normocephalic and atraumatic.   Right Ear: External ear normal.   Left Ear: External ear normal.   Nose: Nose normal.   Mouth/Throat: Oropharynx is clear and moist.   Eyes: EOM are normal. Right eye exhibits no discharge. Left eye exhibits no discharge.   Neck: Normal range of motion. No JVD present. No tracheal deviation present.   Cardiovascular: Normal rate, regular rhythm and intact distal pulses.    Pulmonary/Chest: Effort normal. No stridor. No respiratory distress. She exhibits no tenderness.   Surgical incisions to the right lateral chest wall well healing with no surrounding erythema, wound dehiscence, bleeding, or drainage   Abdominal: Soft. She exhibits no distension.   Musculoskeletal: Normal range of motion. She exhibits no edema, tenderness or deformity.   Neurological: She is alert. Gait normal. GCS score is 15.   Skin: Skin is warm and dry. No rash noted. She is not diaphoretic. No erythema. No pallor.   Underlying ecchymosis to the volar aspect of the left wrist; no overlying erythema; no warmth to palpation; no pain reported with palpation; overlying skin is intact with no lacerations, abrasions, contusions, or lesions; 5/5 grip strength bilaterally; sensation equal and intact bilaterally; +2 radial pulses bilaterally; full active range of motion of bilateral shoulders, elbows, and wrists; patient is able to make a fist, give thumbs up, opposition is intact   Nursing note and vitals reviewed.          Procedures:   None        Diagnostic Studies     Labs:  No tests were performed during this ED visit     Radiology:  No tests were performed during this ED visit    EKG:  No EKG Performed    Summary of medications given in ED     Medications - No data to display        ED Course and Medical-Decision-Making      [name] presented to the Emergency Department with complaint of Arm Pain      Physical exam was remarkable for a minimal amount of underlying ecchymosis to the volar aspect of the left wrist.  Remaining upper extremity exam was benign, finding no overlying erythema and no warmth to palpation.  There was no pain reported with palpation.  The overlying skin was intact with no lacerations, abrasions, contusions, or lesions.  She was able to demonstrate 5/5 grip strength bilaterally and was neurovascularly intact in bilateral upper extremities.  She had full active range of motion of bilateral shoulders, elbows, and wrists.  The patient was able to make a fist, give thumbs up, and opposition was intact.  The patient had surgical incisions to the right lateral chest wall which were well healing with no surrounding erythema, wound dehiscence, bleeding, or drainage.  She was afebrile and her vital signs were within normal limits.    For the patient's chief complaint and after her reassuring physical exam, no labs or imaging was deemed necessary.        At this time, the patient's workup is complete and she is stable for discharge to home.  The patient's symptoms may be due to nerve irritation either due to local inflammation caused by the recent phlebotomy procedures.  This, she was instructed to begin with conservative management including ice therapy and NSAIDs.  The patient  states that she has normal renal function and has been cleared to take ibuprofen after her recent surgery.  The patient has an upcoming appointment with her cardiothoracic surgeon next week.  She was instructed to follow-up with her surgeon for her symptoms, should they not improve with this conservative management.  She still has a primary care provider, if preferred.    The patient is reassured after her visit today. She understands her discharge instructions and has no further questions or complaints.       Chart review:  Chart  review shows that the patient's last presentation to this hospital system was a hospital admission on 11/23/2016 with an admission to CVICU with concern for postoperative pain.  Chart review shows that the patient underwent a right middle lobe lobectomy on 11/23/2016 for a right middle lobe carcinoid.     DARLETTA NOBLETT was seen by myself as well as the Attending, Dr. Raenette Rover, MD, who is in agreement with the assessment and plan.          Impression     1. Paresthesia and pain of left extremity          Plan     1) DISPOSITION: this patient was discharged home in good condition and return precautions were given  2) her PCP is [name], MD  3) Please see AVS for return precautions and discharge instructions    The patient and her husband informed of the results of any tests, a time was given to answer questions, and a plan was proposed. After our discussion, she agreed with the treatment plan and felt comfortable being discharged to home.     Tarri Fuller, PA-C       [place], [place]  12/05/16 [phone]

## 2016-12-05 NOTE — ED Provider Notes (Signed)
ED Attending Attestation Note    Date of service:  12/05/2016    This patient was seen by the advanced practice provider.  I have seen and examined the patient, agree with the workup, evaluation, management and diagnosis.  The care plan has been discussed and I concur.      My assessment reveals a 66 y.o. female, who was hospitalized about 2 weeks ago for right lobectomy for lung cancer, who had an IV and an A- line in her left wrist, who now presents when she noticed some pain in her left forearm.    Her symptoms started a few days ago.  Here she has good capillary refill, no evidence of any vascular compromise of her hand.  Neurovascular sensation is intact.  No evidence of any cellulitis or infection.  No obvious swelling of her hand that would suggest a deep vein thrombosis.  She did not try to take anything for the pain.    She will be discharged home with nonsteroidals and return precautions.  Follow-up with cardiothoracic surgeon next week as planned.

## 2016-12-05 NOTE — ED Notes (Signed)
Waiting for registration to be complete before discharge

## 2016-12-10 ENCOUNTER — Ambulatory Visit: Admit: 2016-12-10 | Discharge: 2016-12-10 | Payer: MEDICARE

## 2016-12-10 DIAGNOSIS — C342 Malignant neoplasm of middle lobe, bronchus or lung: Secondary | ICD-10-CM

## 2016-12-10 NOTE — Unmapped (Signed)
HPI:  Joanna Lopez is a 66y/o female who returns for a postoperative visit following a right VATS middle lobectomy on 11/23/16 for a T1bN0M0, stage IA2, typical carcinoid tumor. She is doing well. She denies dyspnea. She does complain of a several day history of left hand and wrist pain that sometimes travels to her elbow. She denies and numbness, weakness or edema. She was seen in the ED and her exam was normal at the time.     Performance Status:Normal activity - asymptomatic        Past Medical History:   Diagnosis Date   ??? Basal cell cancer    ??? GERD (gastroesophageal reflux disease)    ??? Hyperlipidemia    ??? Hypertension    ??? IBS (irritable bowel syndrome)    ??? OSA (obstructive sleep apnea)      Past Surgical History:   Procedure Laterality Date   ??? APPENDECTOMY     ??? COLONOSCOPY N/A 01/23/2013    Procedure: COLONOSCOPY WITH MAC;  Surgeon: Lurene Shadow, MD;  Location: [facility] ENDOSCOPY;  Service: Gastroenterology;  Laterality: N/A;   ??? FLEXIBLE BRONCHOSCOPY W/ UPPER ENDOSCOPY N/A 09/17/2016    Procedure: BRONCHOSCOPY SUPER D WITH FLUORO AND BIOPSY-EBUS/NAVI;  Surgeon: Johnny Bridge, MD;  Location: [facility] ENDOSCOPY;  Service: Pulmonary;  Laterality: N/A;   ??? submastoid gland removal     ??? THORACOSCOPY Right 11/23/2016    Procedure: RIGHT SIDED VATS MIDDLE LOBE LOBECTOMY,HILAR MEDIASTINAL LYMPH NODE DISSECTION;  Surgeon: Janett Billow, MD;  Location: [facility] OR;  Service: Thoracic;  Laterality: Right;   ??? TONSILLECTOMY     ??? UVULOPALATOPHARYNGOPLASTY       Family History   Problem Relation Age of Onset   ??? Adopted: Yes   ??? Diabetes Son      Social History   Substance Use Topics   ??? Smoking status: Never Smoker   ??? Smokeless tobacco: Never Used   ??? Alcohol use Yes      Comment: 2-3 wine/week     Allergies   Allergen Reactions   ??? Formaldehyde Analogues    ??? Penicillins    ??? Sulfa (Sulfonamide Antibiotics)    ??? Latex Rash     States she has a sensitivity to latex after being around it for days, she gets a rash: she  states that when she wore her cpap machine for 14 days she developed a rash on her face other than this she has no problems stating her dentist wears latex gloves and she has no problems with latex gloves.      Current Outpatient Prescriptions   Medication Sig   ??? acetaminophen Take 3 tablets (975 mg total) by mouth every 6 hours as needed.   ??? atorvastatin    ??? citalopram Take 10 mg by mouth daily.   ??? dicyclomine Take 20 mg by mouth 4 times a day with meals and at bedtime.   ??? docusate sodium Take 1 capsule (100 mg total) by mouth 2 times a day.   ??? hydroCHLOROthiazide    ??? lipase-protease-amylase Take 2 capsules po with each meal. (Patient taking differently: Take 2 capsules po with each meal.  Taking PRN Indications: exocrine pancreatic insufficiency         )   ??? lisinopril Take 10 mg by mouth daily. 10 mg (half tablet) daily   ??? melatonin Take 3 mg by mouth at bedtime. PRN for sleep   ??? methocarbamol Take 1 tablet (500 mg total) by mouth every 6  hours.   ??? metoprolol tartrate Take 0.5 tablets (12.5 mg total) by mouth 2 times a day.   ??? multivitamin Take 1 tablet by mouth daily.   ??? oxymetazoline Use 2 sprays into each nostril 2 times a day. Nightly for sleep   ??? pantoprazole Take 1 tablet (40 mg total) by mouth daily.   ??? PARoxetine Take 10 mg by mouth every morning.   ??? proMETHazine Take 12.5 mg by mouth every 6 hours as needed for Nausea. For nausea associated with migraine   ??? roPINIRole Take 1 tablet (0.25 mg total) by mouth daily with dinner. Take one tablet (0.25 mg) 1-2 hours before bedtime, for three days, then take 2 tablets (0.5 mg) 1-2 hours before bedtime for four days, then take 4 tablets (1 mg) 1-2 hours before bedtime thereafter     No current facility-administered medications for this visit.        Cancer Staging:   pT1bN0M0, stage IA2 typical carcinoid    ROS:   Review of Systems   Constitutional: Negative for activity change, appetite change, chills, diaphoresis, fatigue, fever, weight gain  and weight loss.   Respiratory: Negative for apnea, cough, choking, chest tightness, shortness of breath, wheezing and stridor.    Cardiovascular: Negative for chest pain, palpitations and leg swelling.   Gastrointestinal: Negative for abdominal distention, abdominal pain, anal bleeding, bloating, blood in stool, constipation, diarrhea, heartburn, nausea, rectal pain and vomiting.   Musculoskeletal:        Left arm pain   I have reviewed the ROS and agree with the findings documented above.         Objective:   Physical Exam   Constitutional: She appears well-developed and well-nourished. No distress.   Cardiovascular: Normal rate, regular rhythm and intact distal pulses.    Pulmonary/Chest: Effort normal and breath sounds normal. No respiratory distress. She has no wheezes.   Incisions healing without signs of infection   Musculoskeletal: Normal range of motion. She exhibits no edema, tenderness or deformity.   Skin: Skin is warm and dry.   Psychiatric: She has a normal mood and affect. Her behavior is normal.          Imaging:   N/A    Pathology:   CASE: [id number]  PATIENT: Joanna Lopez    FINAL DIAGNOSIS:    A. ?? Lymph node, level 10, excision:  ???? ?? - One benign lymph node with reactive change (0/1).  ???? ?? - Negative for atypia or malignancy.    B. ?? Lymph node, level 11, excision:  ???? ?? - Two benign lymph nodes with reactive change (0/2).  ???? ?? - Negative for atypia or malignancy.    C. ?? Lung, right middle lobe, lobectomy:  - Well differentiated neuroendocrine tumor, Grade 1 (Typical carcinoid  tumor; 2 cm greatest dimension).  -Pathologic tumor stage: ??pT1b, pN0, pMX.  - Ki-67 index is 2%; mitosis is less than 2 per two millimeter square.  - No evidence of necrosis.  ???? ?? ?? ?? ??- Margin of resection is free of tumor.  ???? ?? ?? ?? ??- Please see Synoptic Report.  ???? ?? - Background lung parenchyma shows emphysematous change.  ???? ?? - Six benign lymph nodes with reactive change (0/6).    D. ?? Lymph node,  level 7, excision:  ???? ?? - Two benign lymph nodes with reactive change (0/2).  ???? ?? - Negative for metastatic disease.    E. ?? Lymph node, level 4R,  excision:  ???? ?? - One benign lymph node with reactive change(0/1).  ???? ?? - Negative for metastatic disease.      SYNOPTIC REPORT  Procedure  Lobectomy  Specimen Laterality  Right  Tumor Site  Right middle lobe  Tumor Size  Greatest dimension (centimeters): ??2 x 1.5 x 1 cm  Tumor Focality  Single focus  Histologic Type  Typical carcinoid tumor  Histologic Grade  G1: Well differentiated  Spread Through Air Spaces (STAS)  Not identified  Visceral Pleura Invasion  Not identified  Lymphovascular Invasion  Not identified  Direct Invasion of Adjacent Structures  No adjacent structures present  Margins  All margins are uninvolved by tumor  ???? ?? Margins examined (specify): ??Bronchial  ???? ?? Distance of invasive carcinoma from closest margin (centimeters): ??1.3  cm  Treatment Effect  No known presurgical therapy  Regional Lymph Nodes  Lymph Node Examination  Number of Lymph Nodes Involved: ??0  Number of Lymph Nodes Examined: ??12 as follows: ??Level 10 - 0/1, level 11 -  0/2, level 7 - 0/2, level 4R - 0/1 and peribronchial - 0/6.  Pathologic Stage Classification (pTNM, AJCC 8th Edition)  Primary Tumor (pT)  pT1b: ?? ?? ?? ?? ??Tumor >1 cm, but less than or equal to 2 cm in greatest  dimension  Regional Lymph Nodes (pN)  pN0: No regional lymph node metastasis  Distant Metastasis (pM)  pMX  Additional Pathologic Findings  Emphysematous change  Ancillary Studies  Tumor cells are positive for synaptophysin and chromogranin and show a  Ki-67 index is 2%, findings support typical carcinoid tumor. ??There is no  evidence of necrosis or increased mitosis or atypia.    Comment(s): ??Patient's history of enlarging right middle lobe nodule is  noted. The sections show well ciecumscribed neuroendocrine tumor  (synaptophysin and chromogranin positive ) with less than 2 mitosis per 2  millimeter square,  ki-67 index of 2% and does not show areas of necrosis.  The histologic and immunphenotypic features are of typical carcinoid tumor  (Grade1).     Treatment:   Right VATS middle lobectomy 11/23/16     Assessment/Plan:   Ms. Gladd returns following a right VATS middle lobectomy for a T1bN0M0, stage IA2, typical carcinoid tumor on 11/23/16. She is doing well. She needs no further treatment. The etiology of her hand/arm pain is unclear, but she has no evidence of venous or arterial compromise. It is likely due to bruising of a nerve and should resolve without further intervention. She would like to continue her surveillance with Dr. Damaris Hippo. I would be happy to see her anytime in the future should the need arise.     Cc:  [name], MD - PCP  Rhea Bleacher, MD - pulmonary  ??

## 2016-12-11 DIAGNOSIS — C342 Malignant neoplasm of middle lobe, bronchus or lung: Secondary | ICD-10-CM

## 2016-12-29 ENCOUNTER — Emergency Department: Admit: 2016-12-29 | Payer: MEDICARE

## 2016-12-29 ENCOUNTER — Inpatient Hospital Stay: Admit: 2016-12-29 | Discharge: 2016-12-29 | Disposition: A | Discharge status: Home / Self Care | Payer: MEDICARE

## 2016-12-29 DIAGNOSIS — K5732 Diverticulitis of large intestine without perforation or abscess without bleeding: Secondary | ICD-10-CM

## 2016-12-29 LAB — LACTIC ACID: Lactate: 1.1 mmol/L (ref 0.5–2.2)

## 2016-12-29 LAB — CBC
Hematocrit: 37.2 % (ref 35.0–45.0)
Hemoglobin: 13.2 g/dL (ref 11.7–15.5)
MCH: 31.3 pg (ref 27.0–33.0)
MCHC: 35.4 g/dL (ref 32.0–36.0)
MCV: 88.4 fL (ref 80.0–100.0)
MPV: 10.1 fL (ref 7.5–11.5)
Platelets: 150 10*3/uL (ref 140–400)
RBC: 4.21 10*6/uL (ref 3.80–5.10)
RDW: 12.9 % (ref 11.0–15.0)
WBC: 14.8 10*3/uL (ref 3.8–10.8)

## 2016-12-29 LAB — DIFFERENTIAL
Basophils Absolute: 44 /uL (ref 0–200)
Basophils Relative: 0.3 % (ref 0.0–1.0)
Eosinophils Absolute: 118 /uL (ref 15–500)
Eosinophils Relative: 0.8 % (ref 0.0–8.0)
Lymphocytes Absolute: 2575 /uL (ref 850–3900)
Lymphocytes Relative: 17.4 % (ref 15.0–45.0)
Monocytes Absolute: 1598 /uL (ref 200–950)
Monocytes Relative: 10.8 % (ref 0.0–12.0)
Neutrophils Absolute: 10464 /uL (ref 1500–7800)
Neutrophils Relative: 70.7 % (ref 40.0–80.0)

## 2016-12-29 LAB — URINALYSIS W/RFL TO MICROSCOPIC
Bilirubin, UA: NEGATIVE
Blood, UA: NEGATIVE
Glucose, UA: NEGATIVE mg/dL
Ketones, UA: 5 mg/dL
Nitrite, UA: NEGATIVE
Protein, UA: 100 mg/dL
RBC, UA: 4 /HPF (ref 0–3)
Specific Gravity, UA: 1.023 (ref 1.005–1.035)
Squam Epithel, UA: 29 /HPF (ref 0–5)
Urobilinogen, UA: <2 mg/dL (ref 0.2–1.9)
WBC, UA: 38 /HPF (ref 0–5)
pH, UA: 5 (ref 5.0–8.0)

## 2016-12-29 LAB — BASIC METABOLIC PANEL
Anion Gap: 12 mmol/L (ref 3–16)
Anion Gap: 6 mmol/L (ref 3–16)
BUN: 26 mg/dL (ref 7–25)
BUN: 22 mg/dL (ref 7–25)
CO2: 25 mmol/L (ref 21–33)
CO2: 29 mmol/L (ref 21–33)
Calcium: 9.5 mg/dL (ref 8.6–10.3)
Calcium: 8.5 mg/dL (ref 8.6–10.3)
Chloride: 102 mmol/L (ref 98–110)
Chloride: 104 mmol/L (ref 98–110)
Creatinine: 1.63 mg/dL (ref 0.60–1.30)
Creatinine: 1.36 mg/dL (ref 0.60–1.30)
Glucose: 101 mg/dL (ref 70–100)
Glucose: 85 mg/dL (ref 70–100)
Osmolality, Calculated: 293 mOsm/kg (ref 278–305)
Osmolality, Calculated: 291 mOsm/kg (ref 278–305)
Potassium: 3.3 mmol/L (ref 3.5–5.3)
Potassium: 3 mmol/L (ref 3.5–5.3)
Sodium: 139 mmol/L (ref 133–146)
Sodium: 139 mmol/L (ref 133–146)
eGFR AA CKD-EPI: 38 See note.
eGFR AA CKD-EPI: 47 See note.
eGFR NONAA CKD-EPI: 32 See note.
eGFR NONAA CKD-EPI: 40 See note.

## 2016-12-29 LAB — HEPATIC FUNCTION PANEL
ALT: 14 U/L (ref 7–52)
AST: 11 U/L (ref 13–39)
Albumin: 3.9 g/dL (ref 3.5–5.7)
Alkaline Phosphatase: 69 U/L (ref 36–125)
Bilirubin, Direct: 0.3 mg/dL (ref 0.0–0.4)
Bilirubin, Indirect: 0.5 mg/dL (ref 0.0–1.1)
Total Bilirubin: 0.8 mg/dL (ref 0.0–1.5)
Total Protein: 7.3 g/dL (ref 6.4–8.9)

## 2016-12-29 LAB — VENOUS BLOOD GAS, LINE/SYRINGE
%HBO2-Line Draw: 37.1 % (ref 40.0–70.0)
Base Excess-Line Draw: 0 mmol/L (ref <=3.0)
CO2 Content-Line Draw: 26 mmol/L (ref 25–29)
Carboxyhgb-Line Draw: 1.3 %
HCO3-Line Draw: 25 mmol/L (ref 24–28)
Methemoglobin-Line Draw: 0.8 % (ref 0.0–1.5)
PCO2-Line Draw: 42 mm Hg (ref 41–51)
PH-Line Draw: 7.39 (ref 7.32–7.42)
PO2-Line Draw: 24 mm Hg (ref 25–40)
Reduced Hemoglobin-Line Draw: 60.8 % (ref 0.0–5.0)

## 2016-12-29 LAB — URINE CULTURE, SPECIAL POPULATION

## 2016-12-29 LAB — LIPASE: Lipase: 29 U/L (ref 4–82)

## 2016-12-29 MED ORDER — ondansetron (ZOFRAN ODT) 8 MG disintegrating tablet
8 | ORAL_TABLET | Freq: Three times a day (TID) | ORAL | 0 refills | Status: AC | PRN
Start: 2016-12-29 — End: 2017-02-19

## 2016-12-29 MED ORDER — potassium chloride (KLOR-CON M20) CR tablet 60 mEq
20 | Freq: Once | ORAL | Status: AC
Start: 2016-12-29 — End: 2016-12-29
  Administered 2016-12-29: 22:00:00 60 meq via ORAL

## 2016-12-29 MED ORDER — lactated Ringers 1,000 mL IV fluid
Freq: Once | INTRAVENOUS | Status: AC
Start: 2016-12-29 — End: 2016-12-29
  Administered 2016-12-29: 21:00:00 1000 mL via INTRAVENOUS

## 2016-12-29 MED ORDER — ciprofloxacin (CIPRO) 400 mg in dextrose 5% 200 mL IVPB
400 | Freq: Once | INTRAVENOUS | Status: AC
Start: 2016-12-29 — End: 2016-12-29
  Administered 2016-12-29: 19:00:00 400 mg via INTRAVENOUS

## 2016-12-29 MED ORDER — metroNIDAZOLE (FLAGYL) 500 MG tablet
500 | ORAL_TABLET | Freq: Two times a day (BID) | ORAL | 0 refills | Status: AC
Start: 2016-12-29 — End: 2017-01-05

## 2016-12-29 MED ORDER — ondansetron (ZOFRAN) injection 8 mg
4 | Freq: Once | INTRAMUSCULAR | Status: AC
Start: 2016-12-29 — End: 2016-12-29
  Administered 2016-12-29: 18:00:00 8 mg via INTRAVENOUS

## 2016-12-29 MED ORDER — metroNIDAZOLEFLAGYLinsodiumchlorideisoosmIVPB500mg
500 | Freq: Once | INTRAVENOUS | Status: AC
Start: 2016-12-29 — End: 2016-12-29
  Administered 2016-12-29: 21:00:00 500 mg via INTRAVENOUS

## 2016-12-29 MED ORDER — lactated Ringers 1,000 mL IV fluid
Freq: Once | INTRAVENOUS | Status: AC
Start: 2016-12-29 — End: 2016-12-29
  Administered 2016-12-29: 18:00:00 1000 mL via INTRAVENOUS

## 2016-12-29 MED ORDER — oxyCODONE (ROXICODONE) 5 MG immediate release tablet
5 | ORAL_TABLET | Freq: Four times a day (QID) | ORAL | 0 refills | 6.00000 days | Status: AC | PRN
Start: 2016-12-29 — End: 2017-01-01

## 2016-12-29 MED ORDER — morPHINE injection 4 mg
4 | Freq: Once | INTRAVENOUS | Status: AC
Start: 2016-12-29 — End: 2016-12-29
  Administered 2016-12-29: 18:00:00 4 mg via INTRAVENOUS

## 2016-12-29 MED ORDER — ciprofloxacin HCl (CIPRO) 500 MG tablet
500 | ORAL_TABLET | Freq: Two times a day (BID) | ORAL | 0 refills | Status: AC
Start: 2016-12-29 — End: 2017-01-05

## 2016-12-29 MED FILL — KLOR-CON M20 MEQ TABLET,EXTENDED RELEASE: 20 20 mEq | ORAL | Qty: 3

## 2016-12-29 MED FILL — LACTATED RINGERS INTRAVENOUS SOLUTION: 1000.00 1000.00 mL | INTRAVENOUS | Qty: 1000

## 2016-12-29 MED FILL — ONDANSETRON HCL (PF) 4 MG/2 ML INJECTION SOLUTION: 4 4 mg/2 mL | INTRAMUSCULAR | Qty: 4

## 2016-12-29 MED FILL — METRONIDAZOLE 500 MG/100 ML IN SODIUM CHLOR(ISO) INTRAVENOUS PIGGYBACK: 500 500 mg/100 mL | INTRAVENOUS | Qty: 100

## 2016-12-29 MED FILL — MORPHINE 4 MG/ML INTRAVENOUS SOLUTION: 4 4 mg/mL | INTRAVENOUS | Qty: 1

## 2016-12-29 MED FILL — CIPROFLOXACIN 400 MG/200 ML IN 5 % DEXTROSE INTRAVENOUS PIGGYBACK: 400 400 mg/200 mL | INTRAVENOUS | Qty: 200

## 2016-12-29 NOTE — ED Provider Notes (Signed)
[facility] ED Note    Date of Service: 12/29/2016    Reason for Visit: Constipation and Abdominal Pain      Patient History     HPI:  Joanna Lopez is a 66 y.o. female with history of basal cell cancer, GERD, HLD, HTN, diverticulitis, and IBS who is 5 weeks s/p right sided VATS middle lobe lobectomy presenting for constipation and abdominal pain. The patient states that she began to have abdominal pain 2 weeks ago after she had taken advil daily to control pain s/p lobectomy. She reports that the pain is located diffusely across her lower abdomen and does not radiate. She reports that the pain is currently a 6/10 in severity. She states that 2 days ago, she tried to have a BM but was unable to void. She states that she then went to church and tried to eat, but was unable to and subsequently vomited. She reports that she came home where she tried to have another BM but only produced mucous with a scant amount of stool. She reports that she has been unable to eat since the problems with constipation started 2 days ago. She reports that she has tried taking dulcolax but denies any sx relief. She denies any melena, blood in stools, fevers, chills, hematuria, abnormal vaginal discharge or bleeding.  No other aggravating or alleviating factors.        Past Medical History:   Diagnosis Date    Basal cell cancer     GERD (gastroesophageal reflux disease)     Hyperlipidemia     Hypertension     IBS (irritable bowel syndrome)     OSA (obstructive sleep apnea)        Past Surgical History:   Procedure Laterality Date    APPENDECTOMY      COLONOSCOPY N/A 01/23/2013    Procedure: COLONOSCOPY WITH MAC;  Surgeon: Lurene Shadow, MD;  Location: [facility] ENDOSCOPY;  Service: Gastroenterology;  Laterality: N/A;    FLEXIBLE BRONCHOSCOPY W/ UPPER ENDOSCOPY N/A 09/17/2016    Procedure: BRONCHOSCOPY SUPER D WITH FLUORO AND BIOPSY-EBUS/NAVI;  Surgeon: Johnny Bridge, MD;  Location: [facility] ENDOSCOPY;  Service: Pulmonary;   Laterality: N/A;    submastoid gland removal      THORACOSCOPY Right 11/23/2016    Procedure: RIGHT SIDED VATS MIDDLE LOBE LOBECTOMY,HILAR MEDIASTINAL LYMPH NODE DISSECTION;  Surgeon: Janett Billow, MD;  Location: [facility] OR;  Service: Thoracic;  Laterality: Right;    TONSILLECTOMY      UVULOPALATOPHARYNGOPLASTY         Joanna Lopez  reports that she has never smoked. She has never used smokeless tobacco. She reports that she drinks alcohol. She reports that she does not use drugs.    Patient's Medications   New Prescriptions    CIPROFLOXACIN HCL (CIPRO) 500 MG TABLET    Take 1 tablet (500 mg total) by mouth 2 times a day for 7 days.    METRONIDAZOLE (FLAGYL) 500 MG TABLET    Take 1 tablet (500 mg total) by mouth 2 times a day for 7 days.    ONDANSETRON (ZOFRAN ODT) 8 MG DISINTEGRATING TABLET    Take 1 tablet (8 mg total) by mouth every 8 hours as needed for Nausea.    OXYCODONE (ROXICODONE) 5 MG IMMEDIATE RELEASE TABLET    Take 1 tablet (5 mg total) by mouth every 6 hours as needed for Pain for up to 3 days.   Previous Medications    ACETAMINOPHEN (TYLENOL) 325 MG TABLET  Take 3 tablets (975 mg total) by mouth every 6 hours as needed.    ATORVASTATIN (LIPITOR) 10 MG TABLET        CITALOPRAM (CELEXA) 10 MG TABLET    Take 10 mg by mouth daily.    DICYCLOMINE (BENTYL) 20 MG TABLET    Take 20 mg by mouth 4 times a day with meals and at bedtime.    DOCUSATE SODIUM (COLACE) 100 MG CAPSULE    Take 1 capsule (100 mg total) by mouth 2 times a day.    HYDROCHLOROTHIAZIDE (HYDRODIURIL) 25 MG TABLET        LIPASE-PROTEASE-AMYLASE (CREON) 24,000-76,000 -120,000 UNIT CPDR    Take 2 capsules po with each meal.    LISINOPRIL (PRINIVIL,ZESTRIL) 20 MG TABLET    Take 10 mg by mouth daily. 10 mg (half tablet) daily    MELATONIN 3 MG TAB    Take 3 mg by mouth at bedtime. PRN for sleep    METHOCARBAMOL (ROBAXIN) 500 MG TABLET    Take 1 tablet (500 mg total) by mouth every 6 hours.    METOPROLOL TARTRATE (LOPRESSOR) 25 MG  TABLET    Take 0.5 tablets (12.5 mg total) by mouth 2 times a day.    MULTIVITAMIN (MULTIVITAMIN) TABLET    Take 1 tablet by mouth daily.    OXYMETAZOLINE (AFRIN) 0.05 % NASAL SPRAY    Use 2 sprays into each nostril 2 times a day. Nightly for sleep    PANTOPRAZOLE (PROTONIX) 40 MG TABLET    Take 1 tablet (40 mg total) by mouth daily.    PAROXETINE (PAXIL) 10 MG TABLET    Take 10 mg by mouth every morning.    PROMETHAZINE (PHENERGAN) 12.5 MG TABLET    Take 12.5 mg by mouth every 6 hours as needed for Nausea. For nausea associated with migraine    ROPINIROLE (REQUIP) 0.25 MG TABLET    Take 1 tablet (0.25 mg total) by mouth daily with dinner. Take one tablet (0.25 mg) 1-2 hours before bedtime, for three days, then take 2 tablets (0.5 mg) 1-2 hours before bedtime for four days, then take 4 tablets (1 mg) 1-2 hours before bedtime thereafter   Modified Medications    No medications on file   Discontinued Medications    No medications on file       Allergies:   Allergies as of 12/29/2016 - Fully Reviewed 12/29/2016   Allergen Reaction Noted    Formaldehyde analogues  07/21/2012    Penicillins  07/21/2012    Sulfa (sulfonamide antibiotics)  07/21/2012    Latex Rash 12/03/2011       Review of Systems     UJW:[id number] abdominal pain, nausea and vomiting.  Denies chest pain, shortness breath or fever.  Denies dysuria. All pertinent positives and negatives are listed in the HPI. All other systems were negative.      Physical Exam       Vitals:    12/29/16 1259 12/29/16 1408 12/29/16 1533 12/29/16 1733   BP: 129/79 120/69 117/67 118/65   BP Location: Right arm      Patient Position: Lying      Pulse: 94 90 91 68   Resp: 18      Temp: 97.8 F (36.6 C)      TempSrc: Oral      SpO2: 97% 93% 94% 99%   Weight: 206 lb 5 oz (93.6 kg)      Height: 5' 6 (1.676 m)  Temp Readings from Last 2 Encounters:   12/29/16 97.8 F (36.6 C) (Oral)   12/05/16 98.4 F (36.9 C) (Oral)     BP Readings from Last 2 Encounters:   12/29/16  118/65   12/10/16 124/76     Pulse Readings from Last 2 Encounters:   12/29/16 68   12/10/16 74        General:  Well developed, well appearing female who is in no acute distress    HEENT:  Head atraumatic, pupils equal round and reactive to light, extraocular movements intact, sclera clear, mucus membranes moist, oropharynx nonerythematous    Neck:  Supple, no lymphadenopathy    Pulmonary:   Clear to auscultation bilaterally, no wheezes, rhonchi, or rales    Cardiac:  Regular rate and rhythm, normal S1S2, no murmurs, rubs, or gallops    Abdomen:  Soft, nontender, nondistended, no rebound and no guarding    Musculoskeletal:  No obvious deformities, no obvious edema, no tenderness to palpation    Vascular:  2+ radial and posterior tibial pulses bilaterally    Skin:  Warm, dry, well perfused, no rashes    Neuro:  AAOx4.  CN 2-12 intact.  Normal gait.  Sensation intact.  Strength grossly equal and symmetric.    Psych: pleasant, conversant, answers questions appropriately      Diagnostic Studies     Labs:    Labs Reviewed   BASIC METABOLIC PANEL - Abnormal; Notable for the following:        Result Value    Potassium 3.3 (*)     BUN 26 (*)     Creatinine 1.63 (*)     Glucose 101 (*)     All other components within normal limits   CBC - Abnormal; Notable for the following:     WBC 14.8 (*)     All other components within normal limits   DIFFERENTIAL - Abnormal; Notable for the following:     Neutrophils Absolute 10,464 (*)     Monocytes Absolute 1,598 (*)     All other components within normal limits   HEPATIC FUNCTION PANEL - Abnormal; Notable for the following:     AST 11 (*)     All other components within normal limits   VENOUS BLOOD GAS, LINE/SYRINGE - Abnormal; Notable for the following:     PO2-Line Draw 24 (*)     %HBO2-Line Draw 37.1 (*)     Reduced Hemoglobin-Line Draw 60.8 (*)     All other components within normal limits   URINALYSIS W/RFL TO MICROSCOP, NO CULT - Abnormal; Notable for the following:     Color,  UA Amber (*)     Clarity, UA Cloudy (*)     Protein, UA 100 (*)     Ketones, UA 5 (*)     Leukocytes, UA Moderate (*)     RBC, UA 4 (*)     WBC, UA 38 (*)     Squam Epithel, UA 29 (*)     Bacteria, UA Occasional (*)     All other components within normal limits   BASIC METABOLIC PANEL - Abnormal; Notable for the following:     Potassium 3.0 (*)     Creatinine 1.36 (*)     Calcium 8.5 (*)     All other components within normal limits    Narrative:     Collect after  2nd liter   LACTIC ACID   LIPASE  Radiology:    Please see electronic medical record for any tests performed in the ED          ED Course and MDM   ERICK OCHELTREE is a 66 y.o. female patient who presented to the emergency department withAbdominal pain.  Patient is status post recent lobectomy.  She has a soft abdomen with mild discomfort to palpation.  Patient was given morphine, Zofran IV fluids.  She reports that she feels better.  Laboratory studies were obtained.  The venous blood gas is unremarkable.  The renal panel showed a creatinine of 1.63 and potassium 3.3 but otherwise is unremarkable.  LFTs are unremarkable.  Lipase was not elevated.  Lactate was not elevated.  The CBC showed a white count of 14.8.  The urinalysis showed 38 white blood cells but also 29 epithelial cells.  Urine culture was collected but the patient will not be treated for a UTI.  A CT scan was obtained because of the patient's age and abdominal pain.  The CT scan showed findings of this with him, to diverticulitis.  The patient was given a dose of IV Cipro and Flagyl.    Patient states that she feels much better.  The patient has a hematocrit of 1.63 which is new for her.  She was given a 2nd liter of fluids in the creatinine was rechecked. The repeat creatinine is downtrending, and the patient states that she would like to go home. Her abdomen is soft, and I am not concerned for an acute surgical abdomen. The patient will be sent home with 7 days of cipro and  flagyl. She will also be sent home with oxycodone for severe pain and zofran. She was found to be hypokalemic to 3.0, and was given 60 meq of potassium, and instructed to eat high potassium foods for the next 3 days.                 Risks, benefits, and alternatives were discussed.The patient was found to be hemodynamically stable and afebrile.They were well appearing during clinical evaluation. As such, the patient was deemed a good candidate for continued management in the outpatient setting. The patient was prescribed oxycodone, zofran, cipro, flagyl. The patient was advised to follow up with her pcp.        At this time the patient has been deemed safe for discharge. My customary discharge instructions including strict return precautions for worsening or new symptoms have been communicated.          Summary of Treatment in ED:  Medications   lactated Ringers 1,000 mL IV fluid (0 mLs Intravenous Stopped 12/29/16 1515)   morPHINE injection 4 mg (4 mg Intravenous Given 12/29/16 1339)   ondansetron (ZOFRAN) injection 8 mg (8 mg Intravenous Given 12/29/16 1339)   ciprofloxacin (CIPRO) 400 mg in dextrose 5% 200 mL IVPB (0 mg Intravenous Stopped 12/29/16 1633)   metroNIDAZOLE (FLAGYL) in sodium chloride, iso-osm IVPB 500 mg (0 mg Intravenous Stopped 12/29/16 1738)   lactated Ringers 1,000 mL IV fluid (0 mLs Intravenous Stopped 12/29/16 1738)   potassium chloride (KLOR-CON M20) CR tablet 60 mEq (60 mEq Oral Given 12/29/16 1811)       Impression     1. Diverticulitis of colon         Plan     1) The patient is discharged to home in good condition.  2) Discharge Medications:  New Prescriptions    CIPROFLOXACIN HCL (CIPRO) 500 MG TABLET  Take 1 tablet (500 mg total) by mouth 2 times a day for 7 days.    METRONIDAZOLE (FLAGYL) 500 MG TABLET    Take 1 tablet (500 mg total) by mouth 2 times a day for 7 days.    ONDANSETRON (ZOFRAN ODT) 8 MG DISINTEGRATING TABLET    Take 1 tablet (8 mg total) by mouth every 8 hours as needed for  Nausea.    OXYCODONE (ROXICODONE) 5 MG IMMEDIATE RELEASE TABLET    Take 1 tablet (5 mg total) by mouth every 6 hours as needed for Pain for up to 3 days.     3) The patient is instructed to return to the emergency department should her symptoms worsen or any concern she believes warrants acute physician evaluation.             Darrick Huntsman, M.D.    By signing my name below, I, Catalina Pizza, attest that this documentation has been prepared under the direction and in the presence of K. Berdine Dance, MD  Scribe name: Catalina Pizza  Date: 12/29/2016      ----  Attending Physician Attestation  The scribes documentation has been prepared under my direction and personally reviewed by me in its entirety. I confirm that the note above accurately reflects all work, treatment, procedures, and medical decision making performed by me.     Lang Snow, MD  Attending Physician Emergency Medicine  12/29/2016 6:17 PM         Felipe Drone, MD  12/29/16 [phone]

## 2016-12-29 NOTE — Unmapped (Signed)
Pt c/o abd pain, constipation and nausea since Sunday. S/p right lower lobectomy 11/23/16

## 2016-12-29 NOTE — Unmapped (Signed)
Patient received AVS and prescriptions.  Educated on such.  Verbalized understanding.  All questions answered.  PIV removed.

## 2016-12-29 NOTE — Unmapped (Signed)
Patient is resting comfortably.

## 2016-12-29 NOTE — Unmapped (Signed)
Patient states she has taken Dulcolax this past week with little to no results. Pt states some of her stool is frothy with stool tinged. Pt states she has now been vomitting as well.

## 2016-12-29 NOTE — Unmapped (Signed)
[facility] ED Post-Discharge Follow-up:  Culture: urine  Preliminary results - in progress. Ciprofloxacin/metronidazole on ED discharge for diverticulitis.   CC: constipation and abdominal pain  Pharmacy will continue to follow.   Bartow Zylstra, PharmD  [phone]

## 2016-12-29 NOTE — Unmapped (Signed)
You were seen in the Emergency Department for abdominal pain. You were found to have diverticulitis. You will be sent home with 7 days of antibiotics. You will be sent home with oxycodone for severe pain. You can take ibuprofen (600-800mg ) and/or Tylenol for pain, as needed.  Do not take more than 4 g of Tylenol in one day.  That is 8 extra strength tablets.  It is best to use Tylenol and ibuprofen in an alternating fashion, every 6-8 hours. Your potassium was a little low, please try to eat foods high in potassium the next 3 days.     1. You were prescribed oxycodone, zofran, cipro, flagyl. Take it as directed.  2. Please Follow up with your PCP.     Please return to the ED if your symptoms worsen or do not resolve; or if you develop any chest pain, shortness of breath, abdominal pain, uncontrollable vomiting, unable to eat, passing out, difficulty moving your arms or legs, difficulty speaking, fever (greater than 100.4??F), or any concern that you feel needs acute physician evaluation.

## 2016-12-29 NOTE — Unmapped (Signed)
[facility] ED Post-Discharge Culture Follow-up:  Culture: urine  Final - negative   No further follow up.

## 2016-12-29 NOTE — Unmapped (Signed)
Patient updated on plan of care and informed of any delays.  Call light is in reach and bed rails up for safety.  No concerns voiced at this time. The patient was advised to use call light if any concerns or questions.

## 2017-02-19 ENCOUNTER — Inpatient Hospital Stay
Admission: EM | Admit: 2017-02-19 | Discharge: 2017-02-24 | Disposition: A | Discharge status: Home / Self Care | Payer: MEDICARE

## 2017-02-19 ENCOUNTER — Ambulatory Visit: Admit: 2017-02-19 | Discharge: 2017-02-19 | Payer: MEDICARE | Attending: Pulmonary Disease

## 2017-02-19 ENCOUNTER — Emergency Department: Admit: 2017-02-19 | Payer: MEDICARE

## 2017-02-19 DIAGNOSIS — I6340 Cerebral infarction due to embolism of unspecified cerebral artery: Secondary | ICD-10-CM

## 2017-02-19 DIAGNOSIS — Z85118 Personal history of other malignant neoplasm of bronchus and lung: Secondary | ICD-10-CM

## 2017-02-19 LAB — CBC
Hematocrit: 44.3 % (ref 35.0–45.0)
Hemoglobin: 15 g/dL (ref 11.7–15.5)
MCH: 30.5 pg (ref 27.0–33.0)
MCHC: 33.9 g/dL (ref 32.0–36.0)
MCV: 89.8 fL (ref 80.0–100.0)
MPV: 10.1 fL (ref 7.5–11.5)
Platelet Estimate: ADEQUATE
Platelets: 204 10*3/uL (ref 140–400)
RBC: 4.94 10*6/uL (ref 3.80–5.10)
RDW: 14.4 % (ref 11.0–15.0)
WBC: 11.8 10*3/uL (ref 3.8–10.8)

## 2017-02-19 LAB — DIFFERENTIAL
Basophils Absolute: 118 /uL (ref 0–200)
Basophils Relative: 1 % (ref 0.0–1.0)
Lymphocytes Absolute: 5192 /uL (ref 850–3900)
Lymphocytes Relative: 44 % (ref 15.0–45.0)
Monocytes Absolute: 708 /uL (ref 200–950)
Monocytes Relative: 6 % (ref 0.0–12.0)
Neutrophils Absolute: 5782 /uL (ref 1500–7800)
Neutrophils Relative: 49 % (ref 40.0–80.0)

## 2017-02-19 LAB — BASIC METABOLIC PANEL
Anion Gap: 11 mmol/L (ref 3–16)
BUN: 24 mg/dL (ref 7–25)
CO2: 23 mmol/L (ref 21–33)
Calcium: 10.1 mg/dL (ref 8.6–10.3)
Chloride: 105 mmol/L (ref 98–110)
Creatinine: 1.25 mg/dL (ref 0.60–1.30)
Glucose: 110 mg/dL (ref 70–100)
Osmolality, Calculated: 293 mOsm/kg (ref 278–305)
Potassium: 3.9 mmol/L (ref 3.5–5.3)
Sodium: 139 mmol/L (ref 133–146)
eGFR AA CKD-EPI: 52 See note.
eGFR NONAA CKD-EPI: 45 See note.

## 2017-02-19 LAB — HEMOGLOBIN A1C: Hemoglobin A1C: 5.5 % (ref 4.8–6.4)

## 2017-02-19 LAB — TROPONIN I: Troponin I: <0.04 ng/mL (ref 0.00–0.03)

## 2017-02-19 MED ORDER — multivitamin with folic acid tablet 1 tablet
400 | Freq: Every day | ORAL | Status: AC
Start: 2017-02-19 — End: 2017-02-24
  Administered 2017-02-20 – 2017-02-24 (×5): 1 via ORAL

## 2017-02-19 MED ORDER — PARoxetine (PAXIL) tablet 10 mg
20 | Freq: Every morning | ORAL | Status: AC
Start: 2017-02-19 — End: 2017-02-24
  Administered 2017-02-20 – 2017-02-24 (×5): 10 mg via ORAL

## 2017-02-19 MED ORDER — hydroCHLOROthiazide (HYDRODIURIL) tablet 25 mg
25 | Freq: Every day | ORAL | Status: AC
Start: 2017-02-19 — End: 2017-02-24
  Administered 2017-02-20 – 2017-02-24 (×5): 25 mg via ORAL

## 2017-02-19 MED ORDER — sodium chloride 0.9 % infusion
INTRAVENOUS | Status: AC
Start: 2017-02-19 — End: 2017-02-19
  Administered 2017-02-19: 22:00:00 50

## 2017-02-19 MED ORDER — citalopram (CELEXA) tablet 10 mg
10 | Freq: Every day | ORAL | Status: AC
Start: 2017-02-19 — End: 2017-02-24
  Administered 2017-02-20 – 2017-02-24 (×5): 10 mg via ORAL

## 2017-02-19 MED ORDER — magnesium hydroxide (MILK OF MAGNESIA) 2,400 mg/10 mL oral suspension 10 mL
2400 | Freq: Every day | ORAL | Status: AC | PRN
Start: 2017-02-19 — End: 2017-02-24

## 2017-02-19 MED ORDER — OMNIPAQUE (iohexol) 350 mg iodine/mL 100 mL
350 | Freq: Once | INTRAVENOUS | Status: AC | PRN
Start: 2017-02-19 — End: 2017-02-19
  Administered 2017-02-19: 22:00:00 100 mL via INTRAVENOUS

## 2017-02-19 MED ORDER — dicyclomine (BENTYL) tablet 20 mg
20 | Freq: Four times a day (QID) | ORAL | Status: AC
Start: 2017-02-19 — End: 2017-02-24
  Administered 2017-02-20 – 2017-02-24 (×16): 20 mg via ORAL

## 2017-02-19 MED ORDER — heparin (porcine) injection 5,000 Units
5000 | Freq: Three times a day (TID) | INTRAMUSCULAR | Status: AC
Start: 2017-02-19 — End: 2017-02-24
  Administered 2017-02-20 – 2017-02-24 (×12): 5000 [IU] via SUBCUTANEOUS

## 2017-02-19 MED ORDER — methocarbamol (ROBAXIN) tablet 500 mg
500 | Freq: Four times a day (QID) | ORAL | Status: AC
Start: 2017-02-19 — End: 2017-02-24
  Administered 2017-02-20 – 2017-02-24 (×15): 500 mg via ORAL

## 2017-02-19 MED ORDER — sodiumchlorideflush10mL
INTRAMUSCULAR | Status: AC
Start: 2017-02-19 — End: 2017-02-24
  Administered 2017-02-20 – 2017-02-24 (×12): 10 mL via INTRAVENOUS

## 2017-02-19 MED ORDER — lipase-protease-amylase (CREON (24,000 units of lipase)) CpDR 2 capsule
24000-76000 | Freq: Four times a day (QID) | ORAL | Status: AC
Start: 2017-02-19 — End: 2017-02-24
  Administered 2017-02-20 – 2017-02-24 (×12): 2 via ORAL

## 2017-02-19 MED ORDER — aspirin tablet 325 mg
325 | Freq: Every day | ORAL | Status: AC
Start: 2017-02-19 — End: 2017-02-24
  Administered 2017-02-20 – 2017-02-24 (×5): 325 mg via ORAL

## 2017-02-19 MED ORDER — sodium chloride 0.9 % infusion
INTRAVENOUS | Status: AC
Start: 2017-02-19 — End: 2017-02-24
  Administered 2017-02-20 – 2017-02-23 (×6): 50 mL/h via INTRAVENOUS

## 2017-02-19 MED ORDER — melatonin Tab 3 mg
3 | Freq: Every evening | ORAL | Status: AC
Start: 2017-02-19 — End: 2017-02-24
  Administered 2017-02-20 – 2017-02-24 (×5): 3 mg via ORAL

## 2017-02-19 MED ORDER — atorvastatin (LIPITOR) tablet 10 mg
10 | Freq: Every evening | ORAL | Status: AC
Start: 2017-02-19 — End: 2017-02-22
  Administered 2017-02-21 – 2017-02-22 (×2): 10 mg via ORAL

## 2017-02-19 MED ORDER — lisinopril (PRINIVIL,ZESTRIL) tablet 10 mg
10 | Freq: Every day | ORAL | Status: AC
Start: 2017-02-19 — End: 2017-02-24
  Administered 2017-02-20 – 2017-02-24 (×5): 10 mg via ORAL

## 2017-02-19 MED ORDER — docusate sodium (COLACE) capsule 100 mg
100 | Freq: Two times a day (BID) | ORAL | Status: AC
Start: 2017-02-19 — End: 2017-02-24
  Administered 2017-02-21 – 2017-02-24 (×3): 100 mg via ORAL

## 2017-02-19 MED ORDER — acetaminophen (TYLENOL) tablet 650 mg
325 | ORAL | Status: AC | PRN
Start: 2017-02-19 — End: 2017-02-24
  Administered 2017-02-20 – 2017-02-23 (×2): 650 mg via ORAL

## 2017-02-19 MED ORDER — pantoprazole (PROTONIX) EC tablet 40 mg
40 | Freq: Every day | ORAL | Status: AC
Start: 2017-02-19 — End: 2017-02-24
  Administered 2017-02-20 – 2017-02-24 (×5): 40 mg via ORAL

## 2017-02-19 MED ORDER — oxymetazoline (AFRIN) 0.05 % nasal spray 2 spray
0.05 | Freq: Two times a day (BID) | NASAL | Status: AC
Start: 2017-02-19 — End: 2017-02-24

## 2017-02-19 MED ORDER — roPINIRole (REQUIP) tablet 0.25 mg
0.25 | Freq: Every day | ORAL | Status: AC
Start: 2017-02-19 — End: 2017-02-24
  Administered 2017-02-20 – 2017-02-23 (×4): 0.25 mg via ORAL

## 2017-02-19 MED FILL — SODIUM CHLORIDE 0.9 % INTRAVENOUS SOLUTION: INTRAVENOUS | Qty: 50

## 2017-02-19 MED FILL — SODIUM CHLORIDE 0.9 % INTRAVENOUS SOLUTION: 50.00 50.00 mL/hr | INTRAVENOUS | Qty: 1000

## 2017-02-19 MED FILL — TYLENOL 325 MG TABLET: 325 325 mg | ORAL | Qty: 2

## 2017-02-19 MED FILL — NASAL DECONGESTANT (OXYMETAZOLINE) 0.05 % SPRAY: 0.05 0.05 % | NASAL | Qty: 15

## 2017-02-19 MED FILL — MELATONIN 3 MG TABLET: 3 3 mg | ORAL | Qty: 1

## 2017-02-19 NOTE — ED Notes (Signed)
Pt returned to room with this RN from CT. On continuous cardiac and SPO2 monitoring.

## 2017-02-19 NOTE — Unmapped (Signed)
Pt c/o double vision that started 1230/1300 today, say her optometrist who advised pt to come to the ED.

## 2017-02-19 NOTE — Unmapped (Signed)
History of Present Illness:      Joanna Lopez is a 66 y.o. female.    HPI    I saw Joanna Lopez last time in October 2015  She had another CT chest done - was seen by her PCP who called me with results  She feels the same  No major changes since last time I saw he  Patient denies hemoptysis, increased shortness of breath, chest pains, headaches, nausea, vomiting, diarrhea  No fevers or chills. No new skin rashes    02/19/2017  Had lobectomy with dr Harriette Bouillon - typical carcinoid  Mild cough  No chest pains    Review of Systems   Constitutional: Positive for fatigue.   HENT: Positive for postnasal drip.    Respiratory: Positive for cough, shortness of breath and wheezing.    Gastrointestinal: Positive for bloating and diarrhea.   Musculoskeletal: Positive for arthralgias.   Neurological: Positive for tremors.   Hematological: Bruises/bleeds easily.   Psychiatric/Behavioral: The patient is nervous/anxious.    All other systems reviewed and are negative.      Objective:   Blood pressure 114/78, pulse 85, height 5' 7 (1.702 m), weight 207 lb 8 oz (94.1 kg), SpO2 98 %.    Physical Exam    Nursing note and vitals reviewed.   Constitutional: Oriented to person, place, and time. Appears well-developed and well-nourished. No distress.   HENT: pupils equal, no scleral icterus, on conjunctivitis, no enlarged thyroid  Head: Normocephalic.    Ears: External ear normal.   Mouth/Throat: Oropharynx is clear and moist. No oropharyngeal exudate.   Eyes: Conjunctivae are normal. Pupils are equal, round, and reactive to light. Right eye exhibits no discharge. Left eye exhibits no discharge.   Neck: Normal range of motion. Neck supple. No JVD present. No tracheal deviation present. No thyromegaly present.   Cardiovascular: Normal rate, regular rhythm, normal heart sounds and intact distal pulses. Exam reveals no gallop and no friction rub.   No murmur heard.   Pulmonary/Chest: Effort normal and breath sounds normal. No  stridor. No respiratory distress. No wheezes. No rales. Exhibits no tenderness.   Abdominal:Exhibits no distension and no mass. There is no tenderness. There is no rebound and no guarding.   Musculoskeletal: exhibits no edema and no tenderness.   Neurological: alert and oriented to person, place, and time. normal reflexes. No cranial nerve deficit. Coordination normal.   Skin: Warm and dry. Not diaphoretic. No erythema.   Psychiatric: Normal mood and affect. Behavior is normal. Judgment and thought content normal.       Exam: CT CHEST WO CONTRAST dated 08/14/2016 10:17 AM EDT  ??  CLINICAL HISTORY: lung nodules;   ??  COMPARISON: February 07, 2014  ??  TECHNIQUE: Multidetector  CT imaging was obtained through the chest in the supine position without intravenous contrast. The images were reconstructed with 5 and 1 mm collimation using a 38 cm field of view.  ??  FINDINGS: The lung windows show that the central airways are patent. Small calcified nodules reflect healed granulomatous infection.  ??  The well-defined noncalcified nodule in the medial segment right middle lobe measures approximately 2.1 x 1.8 cm on series 5 image 214, previously 1.7 x 1.5 cm on February 07, 2014. The central portion of this nodule is fluid attenuation.  ??  The irregular ill-defined subsolid nodule in the superior lingular subsegment of the left upper lobe on series 5 image 193 measures approximately 0.9 x 0.4 cm.  ??  A tiny subpleural nodule at the left apex laterally on series 5 image 73 is unchanged. There are no new lesions.  ??  The mediastinal windows show granulomatous calcification but otherwise no lymphadenopathy.  ??  No coronary artery calcifications are visible. The caliber of the great vessels is normal.  ??  Limited noncontrast imaging through the upper abdomen shows multiple low-attenuation liver lesions, similar to prior studies, likely cysts. A fluid attenuation lesion centrally in the left kidney is larger compared to 2014 but  likely a peripelvic cyst.  ??  The bone windows show no suspicious lesions.  ??  ??  IMPRESSION:  ??  While predominantly fluid attenuation, the right middle lobe nodule is mildly increased in size compared to 3 years previously. This is most likely a benign lesion such as granuloma or hamartoma. The fluid attenuation makes carcinoid tumor less likely. Nevertheless, due to the interval growth a follow-up CT in 12 months should be considered.  ??  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 This can be followed at the same time.  ??  Report Verified by: Rick Duff, M.D. at 08/14/2016 12:53 PM EDT    Assessment:     Lung nodules, carcinoid, s/p resection VATS RML  ??  Plan:   ??  Patient with a large nodule RML that on path was typical carcinoid  Repeat CT chest 6 months after lobectomy  ??  Flu shot recommended every year  ??

## 2017-02-19 NOTE — Unmapped (Signed)
Pt to CT.

## 2017-02-19 NOTE — Unmapped (Signed)
Pt tolerated transfer from ED to room 132 well. Tele placed and [facility] notified. Oriented to room and use of items within. All questions answered and admission documentation completed. Call light within reach and bed in lowest position.    Joanna Lopez

## 2017-02-19 NOTE — Unmapped (Signed)
Joanna Lopez    Date of Service: 02/19/2017    Reason for Visit: Diplopia      Patient History     HPI:  This is a 66 y.o. female with history of GERD, hypertension, hyperlipidemia, sleep apnea presenting for diplopia.  Patient reports around 12:30, she developed double vision.  Reports is more pasty looking to the right.  Denies chest pain, shortness of breath, fevers, chills.  No nausea or vomiting.  No numbness or weakness.  No diarrhea or constipation.  No pain.  No other aggravating or alleviating factors.       Past Medical History:   Diagnosis Date   ??? Basal cell cancer    ??? GERD (gastroesophageal reflux disease)    ??? Hyperlipidemia    ??? Hypertension    ??? IBS (irritable bowel syndrome)    ??? OSA (obstructive sleep apnea)        Past Surgical History:   Procedure Laterality Date   ??? APPENDECTOMY     ??? COLONOSCOPY N/A 01/23/2013    Procedure: COLONOSCOPY WITH MAC;  Surgeon: Lurene Shadow, MD;  Location: [facility] ENDOSCOPY;  Service: Gastroenterology;  Laterality: N/A;   ??? FLEXIBLE BRONCHOSCOPY W/ UPPER ENDOSCOPY N/A 09/17/2016    Procedure: BRONCHOSCOPY SUPER D WITH FLUORO AND BIOPSY-EBUS/NAVI;  Surgeon: Johnny Bridge, MD;  Location: [facility] ENDOSCOPY;  Service: Pulmonary;  Laterality: N/A;   ??? submastoid gland removal     ??? THORACOSCOPY Right 11/23/2016    Procedure: RIGHT SIDED VATS MIDDLE LOBE LOBECTOMY,HILAR MEDIASTINAL LYMPH NODE DISSECTION;  Surgeon: Janett Billow, MD;  Location: [facility] OR;  Service: Thoracic;  Laterality: Right;   ??? TONSILLECTOMY     ??? UVULOPALATOPHARYNGOPLASTY         [name]  reports that she has never smoked. She has never used smokeless tobacco. She reports that she drinks alcohol. She reports that she does not use drugs.    Patient's Medications   New Prescriptions    No medications on file   Previous Medications    ACETAMINOPHEN (TYLENOL) 325 MG TABLET    Take 3 tablets (975 mg total) by mouth every 6 hours as needed.    ATORVASTATIN (LIPITOR) 10 MG TABLET         CITALOPRAM (CELEXA) 10 MG TABLET    Take 10 mg by mouth daily.    DICYCLOMINE (BENTYL) 20 MG TABLET    Take 20 mg by mouth 4 times a day with meals and at bedtime.    DOCUSATE SODIUM (COLACE) 100 MG CAPSULE    Take 1 capsule (100 mg total) by mouth 2 times a day.    HYDROCHLOROTHIAZIDE (HYDRODIURIL) 25 MG TABLET        LIPASE-PROTEASE-AMYLASE (CREON) 24,000-76,000 -120,000 UNIT CPDR    Take 2 capsules po with each meal.    LISINOPRIL (PRINIVIL,ZESTRIL) 20 MG TABLET    Take 10 mg by mouth daily. 10 mg (half tablet) daily    MELATONIN 3 MG TAB    Take 3 mg by mouth at bedtime. PRN for sleep    METHOCARBAMOL (ROBAXIN) 500 MG TABLET    Take 1 tablet (500 mg total) by mouth every 6 hours.    METOPROLOL TARTRATE (LOPRESSOR) 25 MG TABLET    Take 0.5 tablets (12.5 mg total) by mouth 2 times a day.    MULTIVITAMIN (MULTIVITAMIN) TABLET    Take 1 tablet by mouth daily.    ONDANSETRON (ZOFRAN ODT) 8 MG DISINTEGRATING TABLET    Take  1 tablet (8 mg total) by mouth every 8 hours as needed for Nausea.    OXYMETAZOLINE (AFRIN) 0.05 % NASAL SPRAY    Use 2 sprays into each nostril 2 times a day. Nightly for sleep    PANTOPRAZOLE (PROTONIX) 40 MG TABLET    Take 1 tablet (40 mg total) by mouth daily.    PAROXETINE (PAXIL) 10 MG TABLET    Take 10 mg by mouth every morning.    PROMETHAZINE (PHENERGAN) 12.5 MG TABLET    Take 12.5 mg by mouth every 6 hours as needed for Nausea. For nausea associated with migraine    ROPINIROLE (REQUIP) 0.25 MG TABLET    Take 1 tablet (0.25 mg total) by mouth daily with dinner. Take one tablet (0.25 mg) 1-2 hours before bedtime, for three days, then take 2 tablets (0.5 mg) 1-2 hours before bedtime for four days, then take 4 tablets (1 mg) 1-2 hours before bedtime thereafter   Modified Medications    No medications on file   Discontinued Medications    No medications on file       Allergies:   Allergies as of 02/19/2017 - Fully Reviewed 02/19/2017   Allergen Reaction Noted   ??? Formaldehyde analogues   07/21/2012   ??? Penicillins  07/21/2012   ??? Sulfa (sulfonamide antibiotics)  07/21/2012   ??? Latex Rash 12/03/2011       Review of Systems     ROS:  Pertinent positives and negatives included above; all other systems reviewed completely and negative. Specifically, the patient denies Fevers, chills, chest pain, shortness of breath, diarrhea, constipation, numbness, weakness.      Physical Exam     ED Triage Vitals [02/19/17 1636]   Vital Signs Group      Temp 97.7 ??F (36.5 ??C)      Temp Source Oral      Heart Rate 79      Heart Rate Source       Resp 18      SpO2 98 %      BP 139/77      MAP (mmHg)       BP Location       BP Method       Patient Position    SpO2 98 %   O2 Device None (Room air)       General:  Well-appearing elderly female sitting in bed, no acute distress    HEENT:  Head atraumatic, pupils equal round and reactive to light, extraocular movements intact, sclera clear, mucus membranes moist, oropharynx nonerythematous    Neck:  Supple, no lymphadenopathy    Pulmonary:   Clear to auscultation bilaterally, no wheezes, rhonchi, or rales    Cardiac:  Regular rate and rhythm, normal S1S2, no murmurs, rubs, or gallops    Abdomen:  Soft, nontender, nondistended, no rebound and no guarding    Musculoskeletal:  No obvious deformities, no tenderness to palpation    Vascular:  2+ radial pulses bilaterally    Skin:  Warm, dry, well perfused, no rashes    Neuro:  AAOx4.  CN 2-12 intact.  Strength grossly equal and symmetric. Double vision more pronounced with right gaze deviation, but no obvious lag in extraocular movements.  Left-sided mild ataxia with heel-to-shin and finger-nose-finger    NIH Stroke Scale      Interval: Baseline  Time: 8:10 PM  Person Administering Scale: Naleigha Raimondi    1a  Level of consciousness: 0=alert; keenly responsive  1b. LOC questions:  0=Performs both tasks correctly   1c. LOC commands: 0=Performs both tasks correctly   2.  Best Gaze: 0=normal   3.  Visual: 0=No visual loss   4. Facial  Palsy: 0=Normal symmetric movement   5a.  Motor left arm: 0=No drift, limb holds 90 (or 45) degrees for full 10 seconds   5b.  Motor right arm: 0=No drift, limb holds 90 (or 45) degrees for full 10 seconds   6a. motor left leg: 0=No drift, limb holds 90 (or 45) degrees for full 10 seconds   6b  Motor right leg:  0=No drift, limb holds 90 (or 45) degrees for full 10 seconds   7. Limb Ataxia: 2=Present in two limbs   8.  Sensory: 0=Normal; no sensory loss   9. Best Language:  0=No aphasia, normal   10. Dysarthria: 0=Normal   11. Extinction and Inattention: 0=No abnormality   12. Distal motor function: 0=Normal    Total:   2         Diagnostic Studies     Labs:    Please see electronic medical record for any tests performed in the ED     Radiology:    Please see electronic medical record for any tests performed in the ED    EKG:    Normal sinus rhythm.  Normal axis.  No ST segment elevation or depression.  No T-wave inversions.  QTC 465 ms.  No prior.    Emergency Department Procedures     None    ED Course and MDM   MAISY NEWPORT is a 66 y.o. female patient who presented to the emergency department with diplopia.  Nursing notes were reviewed, applicable prior records were reviewed, and a full history and physical examination was performed by me.    ED Course as of Feb 19 2009   Fri Feb 19, 2017   1810 Patient presents with diplopia, as well as some mild left-sided ataxia.  Discussed with the stroke team, and only a stroke scale of 2, so not a candidate for TPA therapy.  We'll get a CT as well as CTA.  [WP]   2008 CT showed no evidence of acute infarct, but did show evidence of likely remote infarct.  Given her mild stroke scale, not indicated for TPA.  CTA shows no evidence of occlusion.  No evidence of other eye etiology of her symptoms.  Given her findings, she will be admitted for MRI, and further stroke workup.  [WP]      ED Course User Index  [WP] Consuello Closs, MD     At this time the patient has been  admitted for further evaluation and management. The patient will continue to be monitored here in the emergency department until which time she is moved to her new treatment location.      Consults:  Stroke team    Summary of Treatment in ED:  Medications   sodium chloride 0.9 % infusion (  Stopped 02/19/17 1824)   OMNIPAQUE (iohexol) 350 mg iodine/mL 100 mL (100 mLs Intravenous Given 02/19/17 1822)       Impression     1. Diplopia         Plan     1) Admit for further stroke workup    Consuello Closs, MD  Emergency Medicine         Consuello Closs, MD  02/19/17 2010

## 2017-02-19 NOTE — Unmapped (Signed)
Rounded on pt and updated pt regarding plan of care. Denies needs.

## 2017-02-20 ENCOUNTER — Inpatient Hospital Stay: Admit: 2017-02-20 | Payer: MEDICARE

## 2017-02-20 LAB — URINALYSIS W/RFL TO MICROSCOPIC
Bilirubin, UA: NEGATIVE
Blood, UA: NEGATIVE
Glucose, UA: NEGATIVE mg/dL
Ketones, UA: NEGATIVE mg/dL
Leukocytes, UA: NEGATIVE
Nitrite, UA: NEGATIVE
Protein, UA: NEGATIVE mg/dL
Specific Gravity, UA: 1.053 (ref 1.005–1.035)
Urobilinogen, UA: <2 mg/dL (ref 0.2–1.9)
pH, UA: 6 (ref 5.0–8.0)

## 2017-02-20 LAB — BASIC METABOLIC PANEL
Anion Gap: 8 mmol/L (ref 3–16)
BUN: 22 mg/dL (ref 7–25)
CO2: 25 mmol/L (ref 21–33)
Calcium: 9.4 mg/dL (ref 8.6–10.3)
Chloride: 105 mmol/L (ref 98–110)
Creatinine: 1.19 mg/dL (ref 0.60–1.30)
Glucose: 102 mg/dL (ref 70–100)
Osmolality, Calculated: 290 mOsm/kg (ref 278–305)
Potassium: 4.3 mmol/L (ref 3.5–5.3)
Sodium: 138 mmol/L (ref 133–146)
eGFR AA CKD-EPI: 55 See note.
eGFR NONAA CKD-EPI: 48 See note.

## 2017-02-20 MED ORDER — GADAVIST (gadobutrol) Soln 9 mL
1 | Freq: Once | INTRAVENOUS | Status: AC | PRN
Start: 2017-02-20 — End: 2017-02-20
  Administered 2017-02-20: 19:00:00 9 mL/kg via INTRAVENOUS

## 2017-02-20 MED FILL — DOCUSATE SODIUM 100 MG CAPSULE: 100 100 MG | ORAL | Qty: 1

## 2017-02-20 MED FILL — ROPINIROLE 0.25 MG TABLET: 0.25 0.25 MG | ORAL | Qty: 1

## 2017-02-20 MED FILL — ASPIRIN 325 MG TABLET: 325 325 MG | ORAL | Qty: 1

## 2017-02-20 MED FILL — MELATONIN 3 MG TABLET: 3 3 mg | ORAL | Qty: 1

## 2017-02-20 MED FILL — CITALOPRAM 10 MG TABLET: 10 10 MG | ORAL | Qty: 1

## 2017-02-20 MED FILL — LISINOPRIL 10 MG TABLET: 10 10 MG | ORAL | Qty: 1

## 2017-02-20 MED FILL — METHOCARBAMOL 500 MG TABLET: 500 500 MG | ORAL | Qty: 1

## 2017-02-20 MED FILL — HEPARIN (PORCINE) 5,000 UNIT/ML INJECTION SOLUTION: 5000 5,000 unit/mL | INTRAMUSCULAR | Qty: 1

## 2017-02-20 MED FILL — THERA 400 MCG TABLET: 400 400 mcg | ORAL | Qty: 1

## 2017-02-20 MED FILL — PAROXETINE 20 MG TABLET: 20 20 MG | ORAL | Qty: 1

## 2017-02-20 MED FILL — SODIUM CHLORIDE 0.9 % INTRAVENOUS SOLUTION: 50.00 50.00 mL/hr | INTRAVENOUS | Qty: 1000

## 2017-02-20 MED FILL — PANTOPRAZOLE 40 MG TABLET,DELAYED RELEASE: 40 40 MG | ORAL | Qty: 1

## 2017-02-20 MED FILL — DICYCLOMINE 20 MG TABLET: 20 20 mg | ORAL | Qty: 1

## 2017-02-20 MED FILL — ATORVASTATIN 10 MG TABLET: 10 10 MG | ORAL | Qty: 1

## 2017-02-20 MED FILL — CREON 24,000-76,000-120,000 UNIT CAPSULE,DELAYED RELEASE: 24000-76000 24,000-76,000 -120,000 unit | ORAL | Qty: 2

## 2017-02-20 MED FILL — CREON 24,000-76,000-120,000 UNIT CAPSULE,DELAYED RELEASE: 24000-76000 24,000-76,000 -120,000 unit | ORAL | Qty: 1

## 2017-02-20 MED FILL — HYDROCHLOROTHIAZIDE 25 MG TABLET: 25 25 MG | ORAL | Qty: 1

## 2017-02-20 NOTE — Unmapped (Signed)
Problem: Fall Prevention  Goal: Patient will remain free of falls  Assess and monitor vitals signs, neurological status including level of consciousness and orientation.  Reassess fall risk per hospital policy.    Ensure arm band on, uncluttered walking paths in room, adequate room lighting, call light and overbed table within reach, bed in low position, wheels locked, side rails up per policy (excluding SNF), and non-skid footwear provided.    Outcome: Progressing      Problem: Daily Care  Goal: Daily care needs are met  Assess and monitor ability to perform self care and identify potential discharge needs.   Outcome: Progressing

## 2017-02-20 NOTE — Unmapped (Signed)
Care transferred to this RN at 2330. Assessment completed, IVF hung and infusing per order, MRI checklist completed with patient. Needs attended, safety interventions in place, RN to monitor.

## 2017-02-20 NOTE — Unmapped (Signed)
[facility] INITIAL CONSULT NOTE      Admit Date: 02/19/2017    Patient's PCP: [name], MD  MD Requesting Consult:  Clarisse Gouge, MD      REASON FOR CONSULT:  Diplopia [H53.2]    CHIEF COMPLAINT:  double vision    HISTORY OF PRESENT ILLNESS:  Joanna Lopez is a 66 y.o. female with past medical history of facial basal cell cancer, HTN,HLD, OSA, IBS, recently diagnosed lung cancer s/p R middle lobectomy who presents with double vision and mild headache.    White driving around 16:10 yesterday she noticed double vision that worsened. States it is worse with looking to the left. Afterward she developed a mild bitemporal headache. No other accompanying symptoms- numbness, weakness, vertigo, difficulty speaking or swallowing.     She has a history of migraines but notes that the headache is very different from typical migraines - more mild and diffuse. She has not history of stroke or seizure. She believes she may have had a TIA 40 years ago and describes an event of transient global amnesia, lost the day prior. She denies other transient neurologic symptoms.     She has been under significant stress as of late- her husband was diagnosed with LBD and she is the primary caregiver for him. He has been trying to drive and this has been difficult for her.           PAST HISTORY:  Past Medical History:   Diagnosis Date   ??? Basal cell cancer    ??? GERD (gastroesophageal reflux disease)    ??? Hyperlipidemia    ??? Hypertension    ??? IBS (irritable bowel syndrome)    ??? OSA (obstructive sleep apnea)      Past Surgical History:   Procedure Laterality Date   ??? APPENDECTOMY     ??? COLONOSCOPY N/A 01/23/2013    Procedure: COLONOSCOPY WITH MAC;  Surgeon: Lurene Shadow, MD;  Location: [facility] ENDOSCOPY;  Service: Gastroenterology;  Laterality: N/A;   ??? FLEXIBLE BRONCHOSCOPY W/ UPPER ENDOSCOPY N/A 09/17/2016    Procedure: BRONCHOSCOPY SUPER D WITH FLUORO AND BIOPSY-EBUS/NAVI;  Surgeon: Johnny Bridge, MD;  Location: [facility]  ENDOSCOPY;  Service: Pulmonary;  Laterality: N/A;   ??? submastoid gland removal     ??? THORACOSCOPY Right 11/23/2016    Procedure: RIGHT SIDED VATS MIDDLE LOBE LOBECTOMY,HILAR MEDIASTINAL LYMPH NODE DISSECTION;  Surgeon: Janett Billow, MD;  Location: [facility] OR;  Service: Thoracic;  Laterality: Right;   ??? TONSILLECTOMY     ??? UVULOPALATOPHARYNGOPLASTY       Family History   Problem Relation Age of Onset   ??? Adopted: Yes   ??? Diabetes Son       reports that she has never smoked. She has never used smokeless tobacco. She reports that she drinks alcohol. She reports that she does not use drugs.    MEDICATIONS:  Scheduled Meds:   ??? aspirin  325 mg Oral Daily 0900   ??? atorvastatin  10 mg Oral Nightly (2100)   ??? citalopram  10 mg Oral Daily 0900   ??? dicyclomine  20 mg Oral 4x Daily WC   ??? docusate sodium  100 mg Oral BID   ??? heparin (porcine)  5,000 Units Subcutaneous Q8H   ??? hydroCHLOROthiazide  25 mg Oral Daily 0900   ??? lipase-protease-amylase  2 capsule Oral 4x Daily WC   ??? lisinopril  10 mg Oral Daily 0900   ??? melatonin  3 mg Oral Nightly (2100)   ???  methocarbamol  500 mg Oral Q6H   ??? multivitamin with folic acid  1 tablet Oral Daily 0900   ??? oxymetazoline  2 spray Nasal BID   ??? pantoprazole  40 mg Oral Daily 0900   ??? PARoxetine  10 mg Oral QAM   ??? roPINIRole  0.25 mg Oral Daily with dinner   ??? sodium chloride  10 mL Intravenous QS     Continuous Infusions:   ??? sodium chloride 0.9 % 50 mL/hr (02/20/17 1239)     PRN Meds: acetaminophen, magnesium hydroxide     MEDICATIONS PRIOR TO ADMISSION:  Prescriptions Prior to Admission   Medication Sig Dispense Refill Last Dose   ??? atorvastatin (LIPITOR) 10 MG tablet    02/19/2017 at Unknown time   ??? dicyclomine (BENTYL) 20 mg tablet Take 20 mg by mouth 4 times a day with meals and at bedtime.   02/19/2017 at Unknown time   ??? hydroCHLOROthiazide (HYDRODIURIL) 25 MG tablet    02/19/2017 at Unknown time   ??? lipase-protease-amylase (CREON) 24,000-76,000 -120,000 unit CpDR Take 2 capsules po  with each meal. (Patient taking differently: Take 2 capsules po with each meal.  Taking PRN Indications: exocrine pancreatic insufficiency         ) 180 capsule 6 02/19/2017 at Unknown time   ??? lisinopril (PRINIVIL,ZESTRIL) 20 MG tablet Take 10 mg by mouth daily. 10 mg (half tablet) daily   02/19/2017 at Unknown time   ??? melatonin 3 mg Tab Take 3 mg by mouth at bedtime. PRN for sleep   02/18/2017 at Unknown time   ??? oxymetazoline (AFRIN) 0.05 % nasal spray Use 2 sprays into each nostril 2 times a day. Nightly for sleep   Past Week at Unknown time   ??? PARoxetine (PAXIL) 10 MG tablet Take 10 mg by mouth every morning.   02/19/2017 at Unknown time   ??? roPINIRole (REQUIP) 0.25 MG tablet Take 1 tablet (0.25 mg total) by mouth daily with dinner. Take one tablet (0.25 mg) 1-2 hours before bedtime, for three days, then take 2 tablets (0.5 mg) 1-2 hours before bedtime for four days, then take 4 tablets (1 mg) 1-2 hours before bedtime thereafter 95 tablet 5 Past Week at Unknown time   ??? acetaminophen (TYLENOL) 325 MG tablet Take 3 tablets (975 mg total) by mouth every 6 hours as needed. 60 tablet 0 Taking   ??? multivitamin (MULTIVITAMIN) tablet Take 1 tablet by mouth daily.   Unknown at Unknown time        ALLERGIES:  Formaldehyde analogues; Penicillins; Sulfa (sulfonamide antibiotics); and Latex      REVIEW OF SYSTEMS:  General: neg for fever, chills   Skin: neg for rashes, itching  HENT: neg for hearing loss, sore throat  Respiratory: neg for cough, shortness of breath  Cardiovascular: neg for chest pain, palpitations  Gastrointestinal: neg for abdominal pain, changes in bowel habits, diarrhea  Genitourinary: neg for dysuria, urgency  Musculoskeletal: neg for joint pain, muscle pain  Psychiatric: neg for depression, anxiety  Neurological: neg for behavioral changes, headache, numbness, weakness, speech changes, vision loss      PHYSICAL EXAM:  BP 109/70 (BP Location: Left arm, Patient Position: Sitting)    Pulse 68    Temp  98.4 ??F (36.9 ??C) (Oral)    Resp 18    Ht 5' 7 (1.702 m)    Wt 204 lb (92.5 kg)    SpO2 96%    BMI 31.95 kg/m??  General Exam:   GEN: NAD, resting comfortably  HEENT: Atraumatic, moist mucous membranes  CV: RRR, no carotid bruits  Pulm: breathing comfortably, CTA b/l  GI: soft, NTTP  Ext: no LEE, warm and well perfused  Psych: appropriate affect, linear thought process    Neurologic Exam:   MS:   Alert, oriented to person, place, time and situation.   Language fluent, appropriate, and without dysarthria.     CN:   Fundoscopic exam reveals bilateral sharp disc margins, no hemorrhage  Visual Fields full to finger-count confrontation   PERRLA, when looking to the right she does not experience double vision, nor with looking up. She notes some double vision just before crossing midline to left and with leftward gaze and also with looking down. She notes that the issue resolves with a patch. There is ?subtle inability of left eye to fully abduct. Apparently this may have been more prominent yesterday and was described in notes.   Facial sensation was intact to light touch    Facial movements are symmetric   Palate moved in the midline   Tongue moved in the midline   Shoulder shrug symmetric    Motor: R / L (MRC Scale)   Deltoid 5 / 5                     Hip Flexion 5 / 5   Biceps 5 / 5                     Knee extension 5 / 5   Triceps 5 / 5                    Knee flexion 5 / 5   Wrist Extensors 5 / 5      Plantar flexion 5 / 5   Interosei 5 / 5                   Ankle dorsiflex 5 / 5   Normal Tone.     Reflexes: R / L (MRC Scale)   Biceps 2 / 2                    Patellar 2 / 2   Triceps 2 / 2                   Achilles 2 / 2   Brachoradialis 2 / 2        Toes down / down     Sensory:   Light touch intact     Coordination:   Finger-to-nose without ataxia      Gait: Narrow based. Able to tandem.         LABS:    Recent Labs      02/19/17   1808   WBC  11.8*   HGB  15.0   HCT  44.3   PLT  204                                                                   Recent Labs      02/19/17   1808  02/20/17   0725   NA  139  138   K  3.9  4.3   CL  105  105   CO2  23  25   BUN  24  22   CREATININE  1.25  1.19   GLUCOSE  110*  102*     No results found for: LIPIDCOMM, CHOLTOT, TRIG, HDL, CHOLHDL, LDL, LDLCALC, LDLDIRECT  No results found for: HGBA1C  No results found for: PTT, INR  Lab Results   Component Value Date    COLORU Straw 02/19/2017    CLARITYU Clear 02/19/2017    PROTEINUA Negative 02/19/2017    PHUR 6.0 02/19/2017    LABSPEC 1.053 (H) 02/19/2017    GLUCOSEU Negative 02/19/2017    BLOODU Negative 02/19/2017    LEUKOCYTESUR Negative 02/19/2017    NITRITE Negative 02/19/2017    BILIRUBINUR Negative 02/19/2017    UROBILINOGEN <2.0 02/19/2017    RBCUA 4 (H) 12/29/2016    WBCUA 38 (H) 12/29/2016    BACTERIA Occasional (A) 12/29/2016         DATA:  This admission:  I personally reviewed the following neuro imaging.    X-ray Portable Chest    Result Date: 02/19/2017  IMPRESSION: Streaky perihilar and bibasilar opacities, likely atelectasis. No acute cardiopulmonary process Approved by Shelda Jakes, MD on 02/19/2017 6:15 PM EDT I have personally reviewed the images and I agree with this report. Report Verified by: Alesia Richards, M.D. at 02/19/2017 6:50 PM EDT    Code Stroke-ct Head Wo Contrast    Result Date: 02/19/2017  IMPRESSION: Age-indeterminate potentially remote lacunar infarct left anterior limb internal capsule. If neurologic deficit correlates, and/or there is further concern for acute infarct, MR with diffusion could be helpful for further evaluation. Report Verified by: Veto Kemps, M.D. at 02/19/2017 6:33 PM EDT          IMPRESSION :  Joanna Lopez is a 66 y.o. female with past medical history of facial basal cell cancer, HTN,HLD, OSA, IBS, recently diagnosed lung cancer s/p R middle lobectomy who presents with double vision and mild headache, more mild and different than typical migraines. On exam findings are  subtle- she notes double vision worse with looking left, resolved with rightward gaze, subtle inability of left eye to fully abduct. No other findings thought ED documented ataxia on left. MRI without stroke but MRA with ?vascular compression of right sixth. This does not fit with exam. MRI negative stroke, ischemic vs increased pressure vs other such as paraneoplastic (recently diagnosed with lung cancer) vs less likely CTA negative aneurysm- headache very mild. ?funcitonal contribution given subtle deficits and somewhat variable description of double vision during exam with significant life stressors of late.       RECOMMENDATIONS :    Lumbar puncture w/ basic studies, IgG index, o bands, cytology, cytom and paraneoplastic panel, hold for further studies    Possible Stroke/TIA diagnostic:  telemetry monitoring, Q4H neuro checks  MRI head without contrast - MRI without stroke but MRA with ?vascular compression of right sixth nerve- this would not fit with symptoms. I discussed with vascular. There is no compression of the left sixth nerve. We discussed fiesta sequence which was already completed on MRI.  CTA head/neck- no significant stenosis, aneurysm.   TTE with bubble to evaluate for cardiac embolic source   Stroke risk factor stratification: HbA1c and LDL pending; non smoker    Therapeutic:  Secondary stroke prevention: aspirin 81 mg daily and atorvastatin 80 mg for high intensity statin age < 12 yo  Blood pressure:  ok to  gradually lower to goal  Maintain blood glucose < 180 with SSI if needed  PT/OT/SLP evaluation and treatment      Stroke risk factor modification:  Goal BP < 130/80   Goal LDL <70, Goal HDL>50   High intensity statin if stroke confirmed  Goal HgbA1c <6.5  Goal BMI 25 kg/m2   ??   ??   ?? Will follow      I have reviewed and updated the history, physical exam, data, assessment, and plan of  the note so that it reflects the evaluation and management of the patient by me on 02/20/2017.     Jahdai Padovano  Carmine Savoy, MD  [facility]  02/20/2017

## 2017-02-20 NOTE — Unmapped (Signed)
[facility]   Medicine Inpatient Group   History and Physical    Patient: Joanna Lopez  YNW:[id number]      Date of Birth: Dec 31, 1950  Age: 66 y.o.  Sex: female   PCP:  [name], MD Room/Bed:   Location: [facility]       02/20/2017 10:17 AM    Chief complaint:       Chief Complaint   Patient presents with   ??? Diplopia       History of Present Illness:   TAMIE MINTEER is a 66 y.o. female with past medical history of basal cell carcinoma of lung status post right thoracoscopic middle lobectomy, Hypertension, Hyperlipidemia and OSA who presented with diplopia specially when she looks at the left side. The double Vision is better at right side Vision. No headache but has been having mild nausea. No weakness in arm or legs.    REVIEW OF SYSTEMS:    Constitutional:  Negative for fever,chills or night sweats  ENT:  Positive for dipplopia  Respiratory:   Negative for shortness of breath,wheezing  Cardiovascular:   Negative for  chest pain, palpitations   Gastrointestinal:  Positive for nausea but no vomiting, diarrhea  Genitourinary:  Negative for polyuria, dysuria   Hematologic/Lymphatic:  Negative for  bleeding tendency, easy bruising  Musculoskeletal:  Negative for myalgias,bone pain  Neurologic:  Negative for  confusion,dysarthria.  Skin:  Negative for itching,rash  Psychiatric:  Negative for depression,anxiety, agitation.  Endocrine:  Negative for polydipsia,polyuria,heat /cold intolerance.  Remainder of review of systems reviewed and negative.    Past Medical History:     Past Medical History:   Diagnosis Date   ??? Basal cell cancer    ??? GERD (gastroesophageal reflux disease)    ??? Hyperlipidemia    ??? Hypertension    ??? IBS (irritable bowel syndrome)    ??? OSA (obstructive sleep apnea)          Past Surgical History:    has a past surgical history that includes Appendectomy; submastoid gland removal; Uvulopalatopharyngoplasty; Tonsillectomy; Colonoscopy (N/A, 01/23/2013); Flexible bronchoscopy w/  upper endoscopy (N/A, 09/17/2016); and Thoracoscopy (Right, 11/23/2016).       Medications:     No current facility-administered medications on file prior to encounter.      Current Outpatient Prescriptions on File Prior to Encounter   Medication Sig Dispense Refill   ??? atorvastatin (LIPITOR) 10 MG tablet      ??? dicyclomine (BENTYL) 20 mg tablet Take 20 mg by mouth 4 times a day with meals and at bedtime.     ??? hydroCHLOROthiazide (HYDRODIURIL) 25 MG tablet      ??? lipase-protease-amylase (CREON) 24,000-76,000 -120,000 unit CpDR Take 2 capsules po with each meal. (Patient taking differently: Take 2 capsules po with each meal.  Taking PRN Indications: exocrine pancreatic insufficiency         ) 180 capsule 6   ??? lisinopril (PRINIVIL,ZESTRIL) 20 MG tablet Take 10 mg by mouth daily. 10 mg (half tablet) daily     ??? melatonin 3 mg Tab Take 3 mg by mouth at bedtime. PRN for sleep     ??? oxymetazoline (AFRIN) 0.05 % nasal spray Use 2 sprays into each nostril 2 times a day. Nightly for sleep     ??? PARoxetine (PAXIL) 10 MG tablet Take 10 mg by mouth every morning.     ??? roPINIRole (REQUIP) 0.25 MG tablet Take 1 tablet (0.25 mg total) by mouth daily with  dinner. Take one tablet (0.25 mg) 1-2 hours before bedtime, for three days, then take 2 tablets (0.5 mg) 1-2 hours before bedtime for four days, then take 4 tablets (1 mg) 1-2 hours before bedtime thereafter 95 tablet 5   ??? acetaminophen (TYLENOL) 325 MG tablet Take 3 tablets (975 mg total) by mouth every 6 hours as needed. 60 tablet 0   ??? multivitamin (MULTIVITAMIN) tablet Take 1 tablet by mouth daily.           Allergies:     Allergies   Allergen Reactions   ??? Formaldehyde Analogues    ??? Penicillins    ??? Sulfa (Sulfonamide Antibiotics)    ??? Latex Rash     States she has a sensitivity to latex after being around it for days, she gets a rash: she states that when she wore her cpap machine for 14 days she developed a rash on her face other than this she has no problems stating her  dentist wears latex gloves and she has no problems with latex gloves.           Social History:    reports that she has never smoked. She has never used smokeless tobacco. She reports that she drinks alcohol. She reports that she does not use drugs.       Family History:   family history includes Diabetes in her son. She was adopted.       Physical Exam:   BP 139/72 (BP Location: Right arm, Patient Position: Lying)    Pulse 58    Temp 97.9 ??F (36.6 ??C) (Oral)    Resp 17    Ht 5' 7 (1.702 m)    Wt 204 lb (92.5 kg)    SpO2 98%    BMI 31.95 kg/m??     General appearance:  Appears comfortable. Well nourished  Eyes: Sclera clear, pupils equal  ENT: Moist mucus membranes, no thrush. Trachea midline.  Cardiovascular: Regular rhythm, normal S1, S2. No murmur, gallop, rub. No edema in lower extremities  Respiratory: Clear to auscultation bilaterally, no wheeze, good inspiratory effort  Gastrointestinal: Abdomen soft, non-tender, not distended, normal bowel sounds  Musculoskeletal: No cyanosis in digits, neck supple  Neurology: double vesion worse on left side Vision.  Alert and oriented in time, place and person. No speech or motor deficits  Psychiatry: Appropriate affect. Not agitated  Skin: Warm, dry, normal turgor, no rash      Labs:   CBC:   Lab Results   Component Value Date    WBC 11.8 (H) 02/19/2017    RBC 4.94 02/19/2017    HGB 15.0 02/19/2017    HGB 13.2 11/23/2016    HCT 44.3 02/19/2017    HCT 40.5 11/23/2016    MCV 89.8 02/19/2017    MCH 30.5 02/19/2017    MCHC 33.9 02/19/2017    RDW 14.4 02/19/2017    PLT 204 02/19/2017    MPV 10.1 02/19/2017     BMP:    Lab Results   Component Value Date    NA 138 02/20/2017    K 4.3 02/20/2017    CL 105 02/20/2017    CO2 25 02/20/2017    BUN 22 02/20/2017    CREATININE 1.19 02/20/2017    CALCIUM 9.4 02/20/2017    GLUCOSE 102 (H) 02/20/2017       CT Scan :  Age-indeterminate potentially remote lacunar infarct left anterior limb internal capsule. If neurologic deficit correlates,  and/or there is further concern  for acute infarct, MR with diffusion could be helpful for further evaluation.  ??    CT angiogram    Mild luminal irregularity of the left vertebral artery potentially due to atherosclerotic disease, with a subtle fusiform dilatation near foramen magnum. Otherwise normal CTA of the head with no evidence of intracranial stenosis, occlusive change, or AV shunting lesion.   ??    Problem List     1. 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 History of lung cancer  3. Hypertension  4. Hyperlipidemia   .     Plan:    ?? Patient has left lateral rectus palsy, diplopia is greatest on left lateral gaze and non specific CT finding . With history of lung CA obtain MRI of brain and consult neurology. Continue with Asprin and statin.  ?? Blood pressure is stable and on lisinopril  ?? 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     Estimated time the beneficiary requires hospitalization:  2-3  Days

## 2017-02-21 MED FILL — METHOCARBAMOL 500 MG TABLET: 500 500 MG | ORAL | Qty: 1

## 2017-02-21 MED FILL — THERA 400 MCG TABLET: 400 400 mcg | ORAL | Qty: 1

## 2017-02-21 MED FILL — DICYCLOMINE 20 MG TABLET: 20 20 mg | ORAL | Qty: 1

## 2017-02-21 MED FILL — PANTOPRAZOLE 40 MG TABLET,DELAYED RELEASE: 40 40 MG | ORAL | Qty: 1

## 2017-02-21 MED FILL — LISINOPRIL 10 MG TABLET: 10 10 MG | ORAL | Qty: 1

## 2017-02-21 MED FILL — ROPINIROLE 0.25 MG TABLET: 0.25 0.25 MG | ORAL | Qty: 1

## 2017-02-21 MED FILL — MELATONIN 3 MG TABLET: 3 3 mg | ORAL | Qty: 1

## 2017-02-21 MED FILL — CITALOPRAM 10 MG TABLET: 10 10 MG | ORAL | Qty: 1

## 2017-02-21 MED FILL — HYDROCHLOROTHIAZIDE 25 MG TABLET: 25 25 MG | ORAL | Qty: 1

## 2017-02-21 MED FILL — HEPARIN (PORCINE) 5,000 UNIT/ML INJECTION SOLUTION: 5000 5,000 unit/mL | INTRAMUSCULAR | Qty: 1

## 2017-02-21 MED FILL — DOCUSATE SODIUM 100 MG CAPSULE: 100 100 MG | ORAL | Qty: 1

## 2017-02-21 MED FILL — SODIUM CHLORIDE 0.9 % INTRAVENOUS SOLUTION: 50.00 50.00 mL/hr | INTRAVENOUS | Qty: 1000

## 2017-02-21 MED FILL — CREON 24,000-76,000-120,000 UNIT CAPSULE,DELAYED RELEASE: 24000-76000 24,000-76,000 -120,000 unit | ORAL | Qty: 2

## 2017-02-21 MED FILL — ASPIRIN 325 MG TABLET: 325 325 MG | ORAL | Qty: 1

## 2017-02-21 MED FILL — PAROXETINE 20 MG TABLET: 20 20 MG | ORAL | Qty: 1

## 2017-02-21 MED FILL — ATORVASTATIN 10 MG TABLET: 10 10 MG | ORAL | Qty: 1

## 2017-02-21 NOTE — Unmapped (Signed)
Problem: Fall Prevention  Goal: Patient will remain free of falls  Assess and monitor vitals signs, neurological status including level of consciousness and orientation.  Reassess fall risk per hospital policy.    Ensure arm band on, uncluttered walking paths in room, adequate room lighting, call light and overbed table within reach, bed in low position, wheels locked, side rails up per policy (excluding SNF), and non-skid footwear provided.    Outcome: Progressing  Patient to remain free of falls. Call light and bedside table within reach, bed in lowest position. Non skid foot wear provided.

## 2017-02-21 NOTE — Progress Notes (Signed)
Patient: Joanna Lopez  BJY:[id number]      Date of Birth: 1950/05/18  Age: 66 y.o.  Sex: female   PCP:  [name], MD Room/Bed:132/M132 Location: [facility]    [facility]   Medicine Inpatient Group   Progress Note    02/21/2017       Subjective  :   The patient was seen and examined. Notes and labs reviewed.  Joanna Lopez is a 66 y.o. female with past medical history of basal cell carcinoma of lung status post right thoracoscopic middle lobectomy, Hypertension, Hyperlipidemia and OSA who presented with diplopia .      Patient is alert and oriented 3.  No weakness in the upper and lower extremities.  Continue to have Diplopia and improve this symptom and close her left eyes.    Reviewed interval ancillary notes    Current Medications :      aspirin  325 mg Oral Daily 0900    atorvastatin  10 mg Oral Nightly (2100)    citalopram  10 mg Oral Daily 0900    dicyclomine  20 mg Oral 4x Daily WC    docusate sodium  100 mg Oral BID    heparin (porcine)  5,000 Units Subcutaneous Q8H    hydroCHLOROthiazide  25 mg Oral Daily 0900    lipase-protease-amylase  2 capsule Oral 4x Daily WC    lisinopril  10 mg Oral Daily 0900    melatonin  3 mg Oral Nightly (2100)    methocarbamol  500 mg Oral Q6H    multivitamin with folic acid  1 tablet Oral Daily 0900    oxymetazoline  2 spray Nasal BID    pantoprazole  40 mg Oral Daily 0900    PARoxetine  10 mg Oral QAM    roPINIRole  0.25 mg Oral Daily with dinner    sodium chloride  10 mL Intravenous QS     Review of system:   All systems was reviewed.  No significant change in review of system except Continue Diplopia    Physical exam:     BP 142/67 (BP Location: Right arm, Patient Position: Lying)   Pulse 78   Temp 97.6 F (36.4 C) (Oral)   Resp 16   Ht 5' 7 (1.702 m)   Wt (!) 226 lb 4 oz (102.6 kg)   SpO2 96%   BMI 35.44 kg/m     Intake/Output Summary (Last 24 hours) at 02/21/17 2328  Last data filed at 02/21/17 2034   Gross per 24 hour    Intake             2095 ml   Output                0 ml   Net             2095 ml    Wt Readings from Last 3 Encounters:   02/21/17 (!) 226 lb 4 oz (102.6 kg)   02/19/17 207 lb 8 oz (94.1 kg)   12/29/16 206 lb 5 oz (93.6 kg)       General appearance:  Appears comfortable  Eyes: Sclera clear. Pupils equal.  ENT: Moist mucus membranes.    Neck: Trachea midline, no adenopathy.  Cardiovascular: Regular rhythm, normal S1, S2. No murmur. No edema in lower extremities  Respiratory: Not using accessory muscles. Clear to auscultation bilaterally, no wheeze, good inspiratory effort  GI: Abdomen soft, non tender, not distended, normal bowel sounds  Musculoskeletal: No cyanosis in digits, neck supple  Neurology: CN 2-12 grossly intact. No speech or motor deficits  Psych: Normal affect. 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    Tortuosity of the basilar artery causes mild displacement of the right 6th cranial nerve, of uncertain relation to clinical symptomatology. A variant of vascular compression syndrome cannot be excluded.    Problem List:     1. 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 History of lung cancer  3. Hypertension  4. Hyperlipidemia     Assessment & Plan:      Patient has left lateral rectus palsy, diplopia is greatest on left lateral gaze and non specific CT finding .  MRI of brain Was positive for mild displacement of the right sixth cranial nerve. Continue with Asprin and statin.Follow neurology recommendations   Blood pressure is stable and on lisinopril   Continue with DVT and GI  prophylaxis    Signed: Edwina Barth  02/21/2017

## 2017-02-21 NOTE — Unmapped (Signed)
[facility] FOLLOW-UP CONSULT NOTE      INTERVAL HISTORY:  CC:  Double vision    Double vision improved today- less prominent when looking to left. Still wearing eye patch. Awaiting LP    ROS:  General: no fevers, chills  Neurological: no changes in behavior, no new numbness, sensory changes, weakness, vision changes, speech problems    MEDICATIONS  Scheduled Meds:  ??? aspirin  325 mg Oral Daily 0900   ??? atorvastatin  10 mg Oral Nightly (2100)   ??? citalopram  10 mg Oral Daily 0900   ??? dicyclomine  20 mg Oral 4x Daily WC   ??? docusate sodium  100 mg Oral BID   ??? heparin (porcine)  5,000 Units Subcutaneous Q8H   ??? hydroCHLOROthiazide  25 mg Oral Daily 0900   ??? lipase-protease-amylase  2 capsule Oral 4x Daily WC   ??? lisinopril  10 mg Oral Daily 0900   ??? melatonin  3 mg Oral Nightly (2100)   ??? methocarbamol  500 mg Oral Q6H   ??? multivitamin with folic acid  1 tablet Oral Daily 0900   ??? oxymetazoline  2 spray Nasal BID   ??? pantoprazole  40 mg Oral Daily 0900   ??? PARoxetine  10 mg Oral QAM   ??? roPINIRole  0.25 mg Oral Daily with dinner   ??? sodium chloride  10 mL Intravenous QS     Continuous Infusions:  ??? sodium chloride 0.9 % 50 mL/hr (02/21/17 2353)     PRN Meds:.acetaminophen, magnesium hydroxide     MEDICATIONS PRIOR TO ADMISSION:  Prescriptions Prior to Admission   Medication Sig Dispense Refill Last Dose   ??? atorvastatin (LIPITOR) 10 MG tablet    02/19/2017 at Unknown time   ??? dicyclomine (BENTYL) 20 mg tablet Take 20 mg by mouth 4 times a day with meals and at bedtime.   02/19/2017 at Unknown time   ??? hydroCHLOROthiazide (HYDRODIURIL) 25 MG tablet    02/19/2017 at Unknown time   ??? lipase-protease-amylase (CREON) 24,000-76,000 -120,000 unit CpDR Take 2 capsules po with each meal. (Patient taking differently: Take 2 capsules po with each meal.  Taking PRN Indications: exocrine pancreatic insufficiency         ) 180 capsule 6 02/19/2017 at Unknown time   ??? lisinopril (PRINIVIL,ZESTRIL) 20 MG tablet Take 10 mg by mouth  daily. 10 mg (half tablet) daily   02/19/2017 at Unknown time   ??? melatonin 3 mg Tab Take 3 mg by mouth at bedtime. PRN for sleep   02/18/2017 at Unknown time   ??? oxymetazoline (AFRIN) 0.05 % nasal spray Use 2 sprays into each nostril 2 times a day. Nightly for sleep   Past Week at Unknown time   ??? PARoxetine (PAXIL) 10 MG tablet Take 10 mg by mouth every morning.   02/19/2017 at Unknown time   ??? roPINIRole (REQUIP) 0.25 MG tablet Take 1 tablet (0.25 mg total) by mouth daily with dinner. Take one tablet (0.25 mg) 1-2 hours before bedtime, for three days, then take 2 tablets (0.5 mg) 1-2 hours before bedtime for four days, then take 4 tablets (1 mg) 1-2 hours before bedtime thereafter 95 tablet 5 Past Week at Unknown time   ??? acetaminophen (TYLENOL) 325 MG tablet Take 3 tablets (975 mg total) by mouth every 6 hours as needed. 60 tablet 0 Taking   ??? multivitamin (MULTIVITAMIN) tablet Take 1 tablet by mouth daily.   Unknown at Unknown time  ALLERGIES:  Formaldehyde analogues; Penicillins; Sulfa (sulfonamide antibiotics); and Latex    PHYSICAL EXAM:  BP 150/88 (BP Location: Right arm, Patient Position: Sitting)    Pulse 80    Temp 97.8 ??F (36.6 ??C) (Oral)    Resp 18    Ht 5' 7 (1.702 m)    Wt (!) 225 lb 8 oz (102.3 kg)    SpO2 99%    BMI 35.32 kg/m??     General Exam  GEN: NAD  Pulm: breathing comfortably, on RA  Extr: no clubbing, cyanosis or edema    Brief Neurological Exam:  Mental Status: alert and oriented to person, place, time and situation. Attention and language appropriate.   Cranial Nerves: Extraocular movements appear intact, though she notes double vision with forward gaze as well as slight gaze to right and more prominent on gaze to left- less than yesterday. Visual fields intact to finger confrontation. Face symmetric. Hearing intact to voice. Tongue midline.  Motor: R / L (MRC Scale)   Deltoid 5 / 5                     Hip Flexion 5 / 5   Biceps 5 / 5                     Knee extension 5 / 5    Triceps 5 / 5                    Knee flexion 5 / 5   Wrist Extensors 5 / 5      Plantar flexion 5 / 5   Interosei 5 / 5                   Ankle dorsiflex 5 / 5   Sensory: light touch intact bilaterally upper and lower extremities  Coord: no dysmetria or tremor noted  Reflexes: reflexes are symmetric bilaterally upper and lower extremities at 2+  Gait: steady, no truncal ataxia    LABS:    Recent Labs      02/19/17   1808   WBC  11.8*   HGB  15.0   HCT  44.3   PLT  204                                                                  Recent Labs      02/19/17   1808  02/20/17   0725   NA  139  138   K  3.9  4.3   CL  105  105   CO2  23  25   BUN  24  22   CREATININE  1.25  1.19   GLUCOSE  110*  102*     Lab Results   Component Value Date    CHOLTOT 174 02/22/2017    TRIG 249 (H) 02/22/2017    HDL 41 (L) 02/22/2017    LDL 83 02/22/2017     Lab Results   Component Value Date    HGBA1C 5.5 02/19/2017     No results found for: PTT, INR    RADIOLOGY/DIAGNOSTIC:  Past neuroradiology studies  MRI head results:  Mri Head W Wo Contrast  Result Date: 02/20/2017  IMPRESSION: 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 Tortuosity of the basilar artery causes mild displacement of the right 6th cranial nerve, of uncertain relation to clinical symptomatology. A variant of vascular compression syndrome cannot be excluded. Report Verified by: Veto Kemps, M.D. at 02/20/2017 3:30 PM EDT    CT head results:  No results found for the past 12 months  Brain vessel imaging results:  No results found for the past 12 months      IMPRESSION :  Joanna Lopez is a 66 y.o. female with past medical history of facial basal cell cancer, HTN,HLD, OSA, IBS, recently diagnosed lung cancer s/p R middle lobectomy who presents with double vision and mild headache, more mild and different than typical migraines. On initial exam findings are subtle- she notes double vision worse with  looking left, resolved with rightward gaze, subtle inability of left eye to fully abduct. No other findings thought ED documented ataxia on left. MRI without stroke but MRA with ?vascular compression of right sixth. This does not fit with exam. MRI negative stroke, ischemic vs increased pressure vs other such as paraneoplastic (recently diagnosed with lung cancer) vs less likely CTA negative aneurysm- headache very mild. ?funcitonal contribution given subtle deficits and somewhat variable description of double vision during exam with significant life stressors of late.   ??  ??  RECOMMENDATIONS :  ??  ?? Lumbar puncture w/ basic studies, IgG index, o bands, cytology, cytom and paraneoplastic panel, hold for further studies  ??  ?? Possible Stroke/TIA diagnostic:  ?? telemetry monitoring, Q4H neuro checks  ?? MRI head without contrast - MRI without stroke but MRA with ?vascular compression of right sixth nerve- this would not fit with symptoms. I discussed with vascular. There is no compression of the left sixth nerve. We discussed fiesta sequence which was already completed on MRI.  ?? CTA head/neck- no significant stenosis, aneurysm.   ?? TTE with bubble to evaluate for cardiac embolic source   ?? Stroke risk factor stratification: HbA1c 5.5 and LDL 83; non smoker  ?? ??  ?? Therapeutic:  ?? Secondary stroke prevention: aspirin 81 mg daily and will increase atorvastatin to 20 mg  ?? Blood pressure:  ok to gradually lower to goal  ?? Maintain blood glucose < 180 with SSI if needed  ?? PT/OT/SLP evaluation and treatment   ?? ??  ??  Stroke risk factor modification:  ?? Goal BP < 130/80   ?? Goal LDL <70, Goal HDL>50   ?? Goal HgbA1c <6.5  ?? Goal BMI 25 kg/m2     ?? Will follow  ?? Discussed importance of mental health treatment. Agreeable to therapy. Referral provided.   ??  This patient encounter was 45 minutes and >50% of the encounter was spent discussing the topics detailed in the assessment and plan.      I have reviewed and updated the  history, physical exam, data, assessment, and plan of  the note so that it reflects the evaluation and management of the patient by me on 02/21/2017.      Xyler Terpening Carmine Savoy, MD  [facility]  02/22/2017

## 2017-02-22 ENCOUNTER — Inpatient Hospital Stay: Admit: 2017-02-22 | Payer: MEDICARE

## 2017-02-22 LAB — ENCEPHALOPATHY PANEL, CSF
AGNA-1, CSF: NEGATIVE {titer}
AMPA-R Ab CBA, CSF: NEGATIVE
ANNA-1, CSF: NEGATIVE {titer}
ANNA-2, CSF: NEGATIVE {titer}
ANNA-3, CSF: NEGATIVE {titer}
Amphiphysin Ab, CSF: NEGATIVE {titer}
CASPR2-IgG CBA, CSF: NEGATIVE
CRMP-5-IgG, CSF: NEGATIVE {titer}
GABA-B-R Ab CBA, CSF: NEGATIVE
GAD65 Ab Assay, CSF: 0 nmol/L (ref <=0.02)
LGI1-IgG CBA, C: NEGATIVE
NMDA-R Ab CBA, CSF: NEGATIVE
PCA-1, CSF: NEGATIVE {titer}
PCA-2, CSF: NEGATIVE {titer}
PCA-Tr, CSF: NEGATIVE {titer}
VGKC-complex Ab IPA, CSF: 0 nmol/L (ref 0.00–0.02)

## 2017-02-22 LAB — LIPID PANEL
Cholesterol, Total: 174 mg/dL (ref 0–200)
HDL: 41 mg/dL (ref 60–92)
LDL Cholesterol: 83 mg/dL
Triglycerides: 249 mg/dL (ref 10–149)

## 2017-02-22 LAB — PARANEOPLASTIC ANTIBODY PANEL, SERUM
AChR Gangl Nuero AB: 0 nmol/L (ref <=0.02)
AGNA 1: NEGATIVE titer
ANNA-1: NEGATIVE titer
ANNA-2: NEGATIVE titer
ANNA-3: NEGATIVE titer
Ach Recp(Mus) Bind Ab: 0 nmol/L (ref <=0.02)
Amphiphysin Ab: NEGATIVE titer
CRMP-5 IgG: NEGATIVE titer
N-Type Calcium Channel Ab: 0 nmol/L (ref <=0.03)
Neuronal (V-G) K+Channel AB, Serum: 0 nmol/L (ref <=0.02)
P/Q-Type Calcium Channel Ab: 0.04 nmol/L (ref <=0.02)
PCA-1, Serum: NEGATIVE titer
PCA-2, Serum: NEGATIVE titer
PCA-Tr, Serum: NEGATIVE titer
Striational (Striated Muscle) AB, S: 1:240 {titer}

## 2017-02-22 LAB — FLUID CELL COUNT, CSF TUBE 1
RBC, CSF TUBE 1: 143 /uL (ref 0–10)
Total Nucleated Cells, CSF Tube 1: 4 /uL (ref 0–5)

## 2017-02-22 LAB — IGG SYNTHESIS RATE CSF+SERUM
Albumin, CSF: 19 mg/dL (ref 11–48)
Albumin: 3.4 g/dL (ref 3.6–4.8)
CSF/Serum Index: 6 (ref 0–8)
IgG Index, CSF: 0.4 (ref 0.0–0.7)
IgG Synthesis: -3.4 mg/d
IgG, CSF: 2 mg/dL (ref 0.0–8.6)
IgG/Alb. Ratio CSF: 0.11 (ref 0.00–0.25)
IgG: 825 mg/dL (ref 700–1600)

## 2017-02-22 LAB — CSF CULTURE PLUS STAIN: Culture Result: NO GROWTH

## 2017-02-22 LAB — CSF GRAM STAIN: Gram Stain Result: NONE SEEN

## 2017-02-22 LAB — OLIGOCLONAL BANDING CSF+SERUM

## 2017-02-22 LAB — FLOW CYTOMETRY DIAGNOSTIC PANEL

## 2017-02-22 LAB — CSF CELL COUNT WITH DIFFERENTIAL
RBC, CSF: 2 /uL (ref 0–10)
Total Nucleated Cells, CSF: 4 /uL (ref 0–5)

## 2017-02-22 LAB — GLUCOSE, CSF: Glucose, CSF: 56 mg/dL (ref 40–70)

## 2017-02-22 LAB — PROTEIN, CSF: Micro Total  Protein: 39 mg/dL (ref 15–45)

## 2017-02-22 MED ORDER — atorvastatin (LIPITOR) tablet 20 mg
20 | Freq: Every evening | ORAL | Status: AC
Start: 2017-02-22 — End: 2017-02-22
  Administered 2017-02-23: 01:00:00 20 mg via ORAL

## 2017-02-22 MED ORDER — atorvastatin (LIPITOR) tablet 80 mg
80 | Freq: Every evening | ORAL | Status: AC
Start: 2017-02-22 — End: 2017-02-24
  Administered 2017-02-24: 01:00:00 80 mg via ORAL

## 2017-02-22 MED FILL — DICYCLOMINE 20 MG TABLET: 20 20 mg | ORAL | Qty: 1

## 2017-02-22 MED FILL — LISINOPRIL 10 MG TABLET: 10 10 MG | ORAL | Qty: 1

## 2017-02-22 MED FILL — HYDROCHLOROTHIAZIDE 25 MG TABLET: 25 25 MG | ORAL | Qty: 1

## 2017-02-22 MED FILL — HEPARIN (PORCINE) 5,000 UNIT/ML INJECTION SOLUTION: 5000 5,000 unit/mL | INTRAMUSCULAR | Qty: 1

## 2017-02-22 MED FILL — ASPIRIN 325 MG TABLET: 325 325 MG | ORAL | Qty: 1

## 2017-02-22 MED FILL — CREON 24,000-76,000-120,000 UNIT CAPSULE,DELAYED RELEASE: 24000-76000 24,000-76,000 -120,000 unit | ORAL | Qty: 2

## 2017-02-22 MED FILL — METHOCARBAMOL 500 MG TABLET: 500 500 MG | ORAL | Qty: 1

## 2017-02-22 MED FILL — MELATONIN 3 MG TABLET: 3 3 mg | ORAL | Qty: 1

## 2017-02-22 MED FILL — DOCUSATE SODIUM 100 MG CAPSULE: 100 100 MG | ORAL | Qty: 1

## 2017-02-22 MED FILL — THERA 400 MCG TABLET: 400 400 mcg | ORAL | Qty: 1

## 2017-02-22 MED FILL — PAROXETINE 20 MG TABLET: 20 20 MG | ORAL | Qty: 1

## 2017-02-22 MED FILL — CITALOPRAM 10 MG TABLET: 10 10 MG | ORAL | Qty: 1

## 2017-02-22 MED FILL — ATORVASTATIN 20 MG TABLET: 20 20 MG | ORAL | Qty: 1

## 2017-02-22 MED FILL — PANTOPRAZOLE 40 MG TABLET,DELAYED RELEASE: 40 40 MG | ORAL | Qty: 1

## 2017-02-22 MED FILL — ROPINIROLE 0.25 MG TABLET: 0.25 0.25 MG | ORAL | Qty: 1

## 2017-02-22 MED FILL — SODIUM CHLORIDE 0.9 % INTRAVENOUS SOLUTION: 50.00 50.00 mL/hr | INTRAVENOUS | Qty: 1000

## 2017-02-22 NOTE — Unmapped (Signed)
D - Patient requested to take a shower. Has not had a shower since Friday.   A - Patient set up.   R - [facility] aware. Reattached to tele monitor. Patient has family in room sharing a meal with her. IV is now leaking after shower. Will wait till she finishes eating and will start another line. Joanna Lopez

## 2017-02-22 NOTE — Nursing Note (Signed)
Patient back from Lumbar puncture.  Alert, oriented x 4.  Denies any pain or headaches.  Bed rest and laying flat maintained.  Band aid to lumbar back dry and intact.  VSS, afebrile.  Family at bedside.    Joanna Lopez

## 2017-02-22 NOTE — Unmapped (Signed)
[facility]  Medicine Inpatient Group  Progress Note      Subjective:  Interval History: double vision improved  Scheduled for LP today   MRI with out stroke with MRA with ?compression of 6th nerve      Continuous Infusions:  ??? sodium chloride 0.9 % 50 mL/hr (02/21/17 2353)       Objective:    Vital signs in last 24 hours:  Temp:  [97 ??F (36.1 ??C)-98.5 ??F (36.9 ??C)] 97.8 ??F (36.6 ??C)  Heart Rate:  [59-80] 80  Resp:  [16-18] 18  BP: (123-150)/(60-93) 150/88    Intake/Output last 3 shifts:  I/O last 3 completed shifts:  In: 3477 [P.O.:480; I.V.:2997]  Out: -   Intake/Output this shift:  No intake/output data recorded.  Last BMP Result:      Lab 02/20/17  0725   SODIUM 138   POTASSIUM 4.3   CHLORIDE 105   CO2 25   BUN 22   CREATININE 1.19   CALCIUM 9.4     Last CBC Result:      Lab 02/19/17  1808   WBC 11.8*   HEMATOCRIT 44.3   PLATELETS 204   RBC 4.94     No results for input(s): PROTIME, INR in the last 72 hours.    Physical Exam:    Vitals:    02/22/17 0012 02/22/17 0318 02/22/17 0347 02/22/17 0814   BP: 140/60 128/78 123/78 150/88   BP Location: Right arm Right arm Right arm Right arm   Patient Position: Lying Lying Lying Sitting   Pulse: 72 61 59 80   Resp: 16 16 16 18    Temp: 97.8 ??F (36.6 ??C) 97 ??F (36.1 ??C) 97.5 ??F (36.4 ??C) 97.8 ??F (36.6 ??C)   TempSrc: Oral Oral Oral Oral   SpO2: 96% 96% 96% 99%   Weight:   (!) 225 lb 8 oz (102.3 kg)    Height:         General appearance : comfortable, left eye covered with patch   L:CTA, no W/R/R, not labored  H:RRR, S1S2  YNW:GNFA ND/NT, normal BS  Ext: no edema    Assessment:    Principal Problem:    Cerebrovascular accident (CVA) determined by clinical assessment (CMS Dx)  ?compression of 6 th nerve right   lung cancer s/p R middle lobectomy   HTN  HLD  Depression     Plan:  medsurg  Neurology input appreciated  MRI negative for acute stroke  MRA with ?compression of sixth nerve right , for LP today to r/o paraneoplastic syndrome   Contin aspirin Lipitor   Continue home meds for  depression   Code status Full  DVT prophylaxis  Heparin   Possible discharge in am if vision continue to improve and cleared by neurology

## 2017-02-22 NOTE — Consults (Signed)
[facility]  Case Management/Social Work Department  Discharge Planning Assessment    Patient Information     Current Mental Status: Alert and oriented x 4  Patient lives with: Spouse  Type of Home: Multi level  Number of Steps: 4 steps to enter  Level of Activity Prior to Admission: independent           Current Level of Activity: same  DME Available at Home: none  PCP: [name], MD  Home Pharmacy:              [facility] [address] [place] - [address] AT [facility] [address]  [address] [place] [address]  Phone: [phone]     [facility] - [place], [place] - [address]  [address]  [place] [address]  Phone: [phone]             Issues related to obtaining prescriptions: na  Coumadin follow up: na  Transportation at discharge:  Family    Support Systems     Contact person/Caregiver:        Contact Person: Casara Churchill       Phone: [phone]       Relationship to the patient: Spouse       Permission to contact:  yes  Name of POA/Guardian/Next-of-Kin: na       Verified: na  24/7 Supervision/Assistance Available at D/C if needed: intermittent  Barriers/Significant Issues that may affect discharge or follow up care: none    Therapist, nutritional (Current/Prior): na  Home Health/Home Infusion (Current/Prior): na  Non-Skilled Nurse, adult (Current/Prior): na  Oxygen/Bipap/CPAP/Hand Engineer, production Prior to admission: NA  Outpatient Dialysis Services: na    Other Pertinent Information     Medical Insurance Provider:  Payor: MEDICARE / Plan: MEDICARE A AND B / Product Type: Medicare /     Patient admitted to 1 PCT for CVA. PT/OT/ST ordered per [facility] for CVA DX.    Discharge Plan     Met with patient to initiate discussion regarding discharge planning. Introduced self and role of case management/social work and provided Tour manager.    Patient plans to discharge to home and denies needs. PT/OT/SLP  ordered per policy for admitting Diagnosis. Patient complaining of double vision only. Will follow up post evaluation completions    CM/SW will continue to follow and remain available for continued discharge planning.    Patient/Family aware and taking part in the discharge plan.  Patient/family were offered a post-acute provider list as applicable to the discharge plan and insurance provider.  Patient/family were given the freedom to choose providers and financial interest(s) were disclosed as appropriate.        Jacqlyn Krauss RN, [facility]  Case Manager  [phone] Ascom  [phone] Fax

## 2017-02-22 NOTE — H&P (Signed)
Neuroradiology Spine Post Procedure Note  Date: 02/22/2017  Patient: Joanna Lopez  MRN: [id number]    Procedure:   Fluoroscopic Guided Lumbar Puncture    Operators:   Dr. Dolores Hoose    Specimen:  (20) mL of CSF sent to the lab  OP 14.5 cm H20    Medications:  2ml Lidocaine (1%) Subcutaneous    Estimated Blood Loss:  Minimal (Less Than 5 mL)     Complications:  None.

## 2017-02-22 NOTE — Unmapped (Signed)
D - patient resting on bed. Alert and oriented. Respirations easy and unlabored.   A- Neuro assessment completed. No deficits noted except double vision. Plan of care reviewed with patient.   R - Will monitor. Joanna Lopez

## 2017-02-22 NOTE — Unmapped (Signed)
Assessment completed. See doc flowsheets. Pt. On bed. A/O x4, denies any pain or SOB. No deficits except for diplopia. POC discussed with pt. Verbalized understanding. Will continue to monitor. Call light in reach.

## 2017-02-22 NOTE — Unmapped (Signed)
Problem: Discharge Planning  Goal: Identify discharge needs  Outcome: Adequate for Discharge Date Met: 02/22/17  Home without needs

## 2017-02-22 NOTE — Unmapped (Signed)
Occupational Therapy   Reason Patient Not Seen     Name: Joanna Lopez  DOB: 02/28/1951  Attending Physician: Maretta Bees, MD  Admission Diagnosis: Diplopia [H53.2]  Date: 02/22/2017  Precautions:    Reviewed Pertinent hospital course: Yes    Attempted to see pt, however she had an LP performed at 1230 today and is on bedrest x6 hours. Will follow up 02/23/17 as schedule allows for initial OT Evaluation.       Cordie Grice, OTR/L  License #: [id number]  Ascom: [phone]  02/22/2017

## 2017-02-22 NOTE — Unmapped (Signed)
[facility] FOLLOW-UP CONSULT NOTE      INTERVAL HISTORY:  CC:  Double vision    Double vision improved today- less prominent when looking to left. Still wearing eye patch. The previously noted ataxia on left side has resolved.     ROS:  General: no fevers, chills  Neurological: no changes in behavior, no new numbness, sensory changes, weakness, vision changes, speech problems    MEDICATIONS  Scheduled Meds:  ??? aspirin  325 mg Oral Daily 0900   ??? atorvastatin  20 mg Oral Nightly (2100)   ??? citalopram  10 mg Oral Daily 0900   ??? dicyclomine  20 mg Oral 4x Daily WC   ??? docusate sodium  100 mg Oral BID   ??? heparin (porcine)  5,000 Units Subcutaneous Q8H   ??? hydroCHLOROthiazide  25 mg Oral Daily 0900   ??? lipase-protease-amylase  2 capsule Oral 4x Daily WC   ??? lisinopril  10 mg Oral Daily 0900   ??? melatonin  3 mg Oral Nightly (2100)   ??? methocarbamol  500 mg Oral Q6H   ??? multivitamin with folic acid  1 tablet Oral Daily 0900   ??? oxymetazoline  2 spray Nasal BID   ??? pantoprazole  40 mg Oral Daily 0900   ??? PARoxetine  10 mg Oral QAM   ??? roPINIRole  0.25 mg Oral Daily with dinner   ??? sodium chloride  10 mL Intravenous QS     Continuous Infusions:  ??? sodium chloride 0.9 % 50 mL/hr (02/22/17 1659)     PRN Meds:.acetaminophen, magnesium hydroxide     MEDICATIONS PRIOR TO ADMISSION:  Prescriptions Prior to Admission   Medication Sig Dispense Refill Last Dose   ??? atorvastatin (LIPITOR) 10 MG tablet    02/19/2017 at Unknown time   ??? dicyclomine (BENTYL) 20 mg tablet Take 20 mg by mouth 4 times a day with meals and at bedtime.   02/19/2017 at Unknown time   ??? hydroCHLOROthiazide (HYDRODIURIL) 25 MG tablet    02/19/2017 at Unknown time   ??? lipase-protease-amylase (CREON) 24,000-76,000 -120,000 unit CpDR Take 2 capsules po with each meal. (Patient taking differently: Take 2 capsules po with each meal.  Taking PRN Indications: exocrine pancreatic insufficiency         ) 180 capsule 6 02/19/2017 at Unknown time   ??? lisinopril  (PRINIVIL,ZESTRIL) 20 MG tablet Take 10 mg by mouth daily. 10 mg (half tablet) daily   02/19/2017 at Unknown time   ??? melatonin 3 mg Tab Take 3 mg by mouth at bedtime. PRN for sleep   02/18/2017 at Unknown time   ??? oxymetazoline (AFRIN) 0.05 % nasal spray Use 2 sprays into each nostril 2 times a day. Nightly for sleep   Past Week at Unknown time   ??? PARoxetine (PAXIL) 10 MG tablet Take 10 mg by mouth every morning.   02/19/2017 at Unknown time   ??? roPINIRole (REQUIP) 0.25 MG tablet Take 1 tablet (0.25 mg total) by mouth daily with dinner. Take one tablet (0.25 mg) 1-2 hours before bedtime, for three days, then take 2 tablets (0.5 mg) 1-2 hours before bedtime for four days, then take 4 tablets (1 mg) 1-2 hours before bedtime thereafter 95 tablet 5 Past Week at Unknown time   ??? acetaminophen (TYLENOL) 325 MG tablet Take 3 tablets (975 mg total) by mouth every 6 hours as needed. 60 tablet 0 Taking   ??? multivitamin (MULTIVITAMIN) tablet Take 1 tablet by mouth daily.  Unknown at Unknown time        ALLERGIES:  Formaldehyde analogues; Penicillins; Sulfa (sulfonamide antibiotics); and Latex    PHYSICAL EXAM:  BP 134/83 (BP Location: Right arm, Patient Position: Lying)    Pulse 78    Temp 97.9 ??F (36.6 ??C) (Oral)    Resp 18    Ht 5' 7 (1.702 m)    Wt (!) 225 lb 8 oz (102.3 kg)    SpO2 98%    BMI 35.32 kg/m??     General Exam  GEN: NAD  Pulm: breathing comfortably, on RA  Extr: no clubbing, cyanosis or edema    Brief Neurological Exam:  Mental Status: alert and oriented to person, place, time and situation. Attention and language appropriate.   Cranial Nerves: Extraocular movements appear intact, though she notes double vision with forward gaze as well as slight gaze to right and more prominent on gaze to left- less than yesterday. Visual fields intact to finger confrontation. Face symmetric. Hearing intact to voice. Tongue midline.  Motor: R / L (MRC Scale)   Deltoid 5 / 5                     Hip Flexion 5 / 5   Biceps 5 /  5                     Knee extension 5 / 5   Triceps 5 / 5                    Knee flexion 5 / 5   Wrist Extensors 5 / 5      Plantar flexion 5 / 5   Interosei 5 / 5                   Ankle dorsiflex 5 / 5   Sensory: light touch intact bilaterally upper and lower extremities  Coord: no dysmetria or tremor noted  Reflexes: reflexes are symmetric bilaterally upper and lower extremities at 2+  Gait: steady, no truncal ataxia    LABS:    No results for input(s): WBC, HGB, HCT, PLT in the last 72 hours.                                                               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 Tortuosity of the basilar artery causes mild displacement of the right  6th cranial nerve, of uncertain relation to clinical symptomatology. A variant of vascular compression syndrome cannot be excluded. Report Verified by: Veto Kemps, M.D. at 02/20/2017 3:30 PM EDT    CT head results:  No results found for the past 12 months  Brain vessel imaging results:  No results found for the past 12 months    ECHO 02/22/2017  Study Conclusions    - Left ventricle: The cavity size was normal. Wall thickness was  ????normal. Systolic function was normal. The estimated ejection  ????fraction was in the range of 60% to 65%. Wall motion was normal;  ????there were no regional wall motion abnormalities.There was a  ????small, well circumscribed echodensity at the left ventricular  ????apex, of uncertain  etiology. There are no accompanying wall motion  ????abnormalities to suggest an apical thrombus. Doppler parameters  ????are consistent with abnormal left ventricular relaxation (grade 1  ????diastolic dysfunction).  - Mitral valve: Mild regurgitation.  - Right ventricle: Systolic function was normal.  - Atrial septum: No defect or patent foramen ovale was identified.  - Pulmonic valve: Peak gradient (S): 4mm Hg.    IMPRESSION :  Joanna Lopez is a 66 y.o. female with past medical history of facial basal cell cancer, HTN,HLD, OSA, IBS, recently diagnosed lung cancer s/p R middle lobectomy who presents with double vision and mild headache, more mild and different than typical migraines. On initial exam findings are subtle- she notes double vision worse with looking left, resolved with rightward gaze, ?subtle inability of left eye to fully abduct. No other findings thought ED documented ataxia on left. MRI without stroke but MRA with ?vascular compression of right sixth. This does not fit with exam. MRI negative stroke, ischemic vs increased pressure vs other such as paraneoplastic (recently diagnosed with lung cancer) vs less likely CTA negative aneurysm- headache very mild. ?funcitonal contribution given subtle deficits and somewhat variable description of double vision during exam with significant life stressors of late. The fact that ED documented left sided ataxia, now resolved, suggests TIA.     LP with normal preliminary studies including protein, WBC and RBC.   ??  ??  RECOMMENDATIONS :  ??  ?? Lumbar puncture w/ basic studies normal: remaining studies-, IgG index, o bands, cytology, cytom and paraneoplastic panel pending and will need follow up as outpatient in neurology clinic, additional CSF held for further studies    ?? Recommend follow up with neuro-ophthalmology for full exam. Referral provided.  ?? Follow up in general neurology clinic as well in 2 weeks  ??  ?? Possible Stroke/TIA diagnostic:  ?? telemetry  monitoring, Q4H neuro checks  ?? MRI head without contrast - MRI without stroke but MRA with ?vascular compression of right sixth nerve- this would not fit with symptoms. I discussed imaging with vascular,. There is no compression of the left sixth nerve. We discussed fiesta sequence which was already completed on MRI.  ?? CTA head/neck- no significant stenosis or aneurysm.   ?? TTE with bubble to evaluate for cardiac embolic source - normal EF, no PFO, normal sized atria, ? Echodensity at LV apex- will discuss with cardiology   ?? Stroke risk factor stratification: HbA1c 5.5 and LDL 83; non smoker  ?? ??  ?? Therapeutic:  ?? Secondary stroke prevention: aspirin 81 mg daily and will increase atorvastatin to 80 mg  ?? Blood pressure:  ok to gradually lower to goal  ?? Maintain blood glucose < 180 with SSI if needed  ?? PT/OT/SLP  evaluation and treatment   ?? ??  ??  Stroke risk factor modification:  ?? Goal BP < 130/80   ?? Goal LDL <70, Goal HDL>50   ?? Goal HgbA1c <6.5  ?? Goal BMI 25 kg/m2     ?? Discussed importance of mental health treatment. Agreeable to therapy. Referral provided.   ??  This patient encounter was 60 minutes and >50% of the encounter was spent discussing the topics detailed in the assessment and plan.      I have reviewed and updated the history, physical exam, data, assessment, and plan of  the note so that it reflects the evaluation and management of the patient by me on 02/22/2017.      Veasna Santibanez Carmine Savoy, MD  [facility]  02/22/2017

## 2017-02-23 ENCOUNTER — Inpatient Hospital Stay: Admit: 2017-02-23 | Payer: MEDICARE

## 2017-02-23 MED ORDER — perflutren (OPTISON) Susp 0.66 mg
0.22 | Freq: Once | INTRAVENOUS | Status: AC
Start: 2017-02-23 — End: 2017-02-23
  Administered 2017-02-23: 16:00:00 0.66 mg via INTRAVENOUS

## 2017-02-23 MED FILL — HEPARIN (PORCINE) 5,000 UNIT/ML INJECTION SOLUTION: 5000 5,000 unit/mL | INTRAMUSCULAR | Qty: 1

## 2017-02-23 MED FILL — ATORVASTATIN 80 MG TABLET: 80 80 MG | ORAL | Qty: 1

## 2017-02-23 MED FILL — SODIUM CHLORIDE 0.9 % INTRAVENOUS SOLUTION: 50.00 50.00 mL/hr | INTRAVENOUS | Qty: 1000

## 2017-02-23 MED FILL — ROPINIROLE 0.25 MG TABLET: 0.25 0.25 MG | ORAL | Qty: 1

## 2017-02-23 MED FILL — CREON 24,000-76,000-120,000 UNIT CAPSULE,DELAYED RELEASE: 24000-76000 24,000-76,000 -120,000 unit | ORAL | Qty: 2

## 2017-02-23 MED FILL — METHOCARBAMOL 500 MG TABLET: 500 500 MG | ORAL | Qty: 1

## 2017-02-23 MED FILL — HYDROCHLOROTHIAZIDE 25 MG TABLET: 25 25 MG | ORAL | Qty: 1

## 2017-02-23 MED FILL — LISINOPRIL 10 MG TABLET: 10 10 MG | ORAL | Qty: 1

## 2017-02-23 MED FILL — MELATONIN 3 MG TABLET: 3 3 mg | ORAL | Qty: 1

## 2017-02-23 MED FILL — PAROXETINE 20 MG TABLET: 20 20 MG | ORAL | Qty: 1

## 2017-02-23 MED FILL — DICYCLOMINE 20 MG TABLET: 20 20 mg | ORAL | Qty: 1

## 2017-02-23 MED FILL — TYLENOL 325 MG TABLET: 325 325 mg | ORAL | Qty: 2

## 2017-02-23 MED FILL — DOCUSATE SODIUM 100 MG CAPSULE: 100 100 MG | ORAL | Qty: 1

## 2017-02-23 MED FILL — CITALOPRAM 10 MG TABLET: 10 10 MG | ORAL | Qty: 1

## 2017-02-23 MED FILL — ASPIRIN 325 MG TABLET: 325 325 MG | ORAL | Qty: 1

## 2017-02-23 MED FILL — PANTOPRAZOLE 40 MG TABLET,DELAYED RELEASE: 40 40 MG | ORAL | Qty: 1

## 2017-02-23 MED FILL — OPTISON 0.22 MG/ML INTRAVENOUS SUSPENSION: 0.22 0.22 mg/mL | INTRAVENOUS | Qty: 3

## 2017-02-23 MED FILL — THERA 400 MCG TABLET: 400 400 mcg | ORAL | Qty: 1

## 2017-02-23 NOTE — Unmapped (Signed)
Speech Language Pathology/Screen only    No current speech tx needs.       Name: Joanna Lopez  DOB: 1950/10/20  Attending Physician: Maretta Bees, MD  Admission Diagnosis: Diplopia [H53.2]  Date: 02/23/2017  Precautions: double vision    Reviewed Pertinent hospital course: Yes     Orders received for speech tx.  Chart reviewed.  Discussed current status with RN and pt.  No change in speech, language, cognition or swallowing.  Educated pt on role of SLP. Discussed concerns with RN.      Fara Boros, M.S. CCC/SLP   Speech Therapist  ASCOM phone: [phone]  02/23/2017

## 2017-02-23 NOTE — Unmapped (Signed)
CARDIOLOGY CONSULT NOTE    Joanna Lopez  01/09/1951  [id number]      Referring Physician: Dr Bufford Spikes  Reason for Consult: CVA     Chief Complaint and History of Present Illness:     66 yo female with OSA , HTN, HPLD , hx of basal cell cancer presents with double vision and CVA on MRI. Echo done as part of the wup showed possible apical LV thrombus despite normal LVEF.No right to left shunt on bubble study. She denies chest pain palpitation syncope or presyncope. No edema no DOE. No prior cardiac issues. Symptoms were sudden in onset.   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   Does not smoke. She is adopted. No other pertinent family history.     EKG NSR no acute ST T changes, TELE no arrhythmia      Pertinent items are noted in HPI.    History:  Past Medical History:   Diagnosis Date   ??? Basal cell cancer    ??? GERD (gastroesophageal reflux disease)    ??? Hyperlipidemia    ??? Hypertension    ??? IBS (irritable bowel syndrome)    ??? OSA (obstructive sleep apnea)      Past Surgical History:   Procedure Laterality Date   ??? APPENDECTOMY     ??? COLONOSCOPY N/A 01/23/2013    Procedure: COLONOSCOPY WITH MAC;  Surgeon: Lurene Shadow, MD;  Location: [facility] ENDOSCOPY;  Service: Gastroenterology;  Laterality: N/A;   ??? FLEXIBLE BRONCHOSCOPY W/ UPPER ENDOSCOPY N/A 09/17/2016    Procedure: BRONCHOSCOPY SUPER D WITH FLUORO AND BIOPSY-EBUS/NAVI;  Surgeon: Johnny Bridge, MD;  Location: [facility] ENDOSCOPY;  Service: Pulmonary;  Laterality: N/A;   ??? submastoid gland removal     ??? THORACOSCOPY Right 11/23/2016    Procedure: RIGHT SIDED VATS MIDDLE LOBE LOBECTOMY,HILAR MEDIASTINAL LYMPH NODE DISSECTION;  Surgeon: Janett Billow, MD;  Location: [facility] OR;  Service: Thoracic;  Laterality: Right;   ??? TONSILLECTOMY     ??? UVULOPALATOPHARYNGOPLASTY       Family History   Problem Relation Age of Onset   ??? Adopted: Yes   ??? Diabetes Son      Social History   Substance Use Topics   ??? Smoking status: Never Smoker   ??? Smokeless tobacco:  Never Used   ??? Alcohol use Yes      Comment: 2-3 wine/week     Allergies   Allergen Reactions   ??? Formaldehyde Analogues    ??? Penicillins    ??? Sulfa (Sulfonamide Antibiotics)    ??? Latex Rash     States she has a sensitivity to latex after being around it for days, she gets a rash: she states that when she wore her cpap machine for 14 days she developed a rash on her face other than this she has no problems stating her dentist wears latex gloves and she has no problems with latex gloves.          No current facility-administered medications on file prior to encounter.      Current Outpatient Prescriptions on File Prior to Encounter   Medication Sig Dispense Refill   ??? atorvastatin (LIPITOR) 10 MG tablet      ??? dicyclomine (BENTYL) 20 mg tablet Take 20 mg by mouth 4 times a day with meals and at bedtime.     ??? hydroCHLOROthiazide (HYDRODIURIL) 25 MG tablet      ??? lipase-protease-amylase (CREON) 24,000-76,000 -120,000 unit CpDR Take 2  capsules po with each meal. (Patient taking differently: Take 2 capsules po with each meal.  Taking PRN Indications: exocrine pancreatic insufficiency         ) 180 capsule 6   ??? lisinopril (PRINIVIL,ZESTRIL) 20 MG tablet Take 10 mg by mouth daily. 10 mg (half tablet) daily     ??? melatonin 3 mg Tab Take 3 mg by mouth at bedtime. PRN for sleep     ??? oxymetazoline (AFRIN) 0.05 % nasal spray Use 2 sprays into each nostril 2 times a day. Nightly for sleep     ??? PARoxetine (PAXIL) 10 MG tablet Take 10 mg by mouth every morning.     ??? roPINIRole (REQUIP) 0.25 MG tablet Take 1 tablet (0.25 mg total) by mouth daily with dinner. Take one tablet (0.25 mg) 1-2 hours before bedtime, for three days, then take 2 tablets (0.5 mg) 1-2 hours before bedtime for four days, then take 4 tablets (1 mg) 1-2 hours before bedtime thereafter 95 tablet 5   ??? acetaminophen (TYLENOL) 325 MG tablet Take 3 tablets (975 mg total) by mouth every 6 hours as needed. 60 tablet 0   ??? multivitamin (MULTIVITAMIN) tablet Take 1  tablet by mouth daily.         Current Facility-Administered Medications   Medication Dose Route Frequency Provider Last Rate Last Dose   ??? acetaminophen (TYLENOL) tablet 650 mg  650 mg Oral Q4H PRN Clarisse Gouge, MD   650 mg at 02/19/17 2220   ??? aspirin tablet 325 mg  325 mg Oral Daily 0900 Clarisse Gouge, MD   325 mg at 02/23/17 0907   ??? atorvastatin (LIPITOR) tablet 80 mg  80 mg Oral Nightly (2100) Marta Antu, MD       ??? citalopram (CELEXA) tablet 10 mg  10 mg Oral Daily 0900 Clarisse Gouge, MD   10 mg at 02/23/17 0907   ??? dicyclomine (BENTYL) tablet 20 mg  20 mg Oral 4x Daily WC Clarisse Gouge, MD   20 mg at 02/23/17 0909   ??? docusate sodium (COLACE) capsule 100 mg  100 mg Oral BID Clarisse Gouge, MD   100 mg at 02/22/17 0904   ??? heparin (porcine) injection 5,000 Units  5,000 Units Subcutaneous Q8H Clarisse Gouge, MD   5,000 Units at 02/23/17 0523   ??? hydroCHLOROthiazide (HYDRODIURIL) tablet 25 mg  25 mg Oral Daily 0900 Clarisse Gouge, MD   25 mg at 02/23/17 0906   ??? lipase-protease-amylase (CREON (24,000 units of lipase)) CpDR 2 capsule  2 capsule Oral 4x Daily WC Clarisse Gouge, MD   2 capsule at 02/23/17 0906   ??? lisinopril (PRINIVIL,ZESTRIL) tablet 10 mg  10 mg Oral Daily 0900 Clarisse Gouge, MD   10 mg at 02/23/17 0907   ??? magnesium hydroxide (MILK OF MAGNESIA) 2,400 mg/10 mL oral suspension 10 mL  10 mL Oral Daily PRN Clarisse Gouge, MD       ??? melatonin Tab 3 mg  3 mg Oral Nightly (2100) Clarisse Gouge, MD   3 mg at 02/22/17 2105   ??? methocarbamol (ROBAXIN) tablet 500 mg  500 mg Oral Q6H Ranjit Katneni, MD   500 mg at 02/23/17 0523   ??? multivitamin with folic acid tablet 1 tablet  1 tablet Oral Daily 0900 Clarisse Gouge, MD   1 tablet at 02/23/17 0905   ??? oxymetazoline (AFRIN) 0.05 % nasal spray 2 spray  2 spray Nasal BID Clarisse Gouge, MD       ???  pantoprazole (PROTONIX) EC tablet 40 mg  40 mg Oral Daily 0900 Clarisse Gouge, MD   40 mg at 02/23/17 0906   ??? PARoxetine (PAXIL) tablet 10 mg  10 mg  Oral QAM Clarisse Gouge, MD   10 mg at 02/23/17 0905   ??? perflutren (OPTISON) Susp 0.66 mg  0.66 mg Intravenous Once Hema Alessandra Grout, MD       ??? roPINIRole (REQUIP) tablet 0.25 mg  0.25 mg Oral Daily with dinner Clarisse Gouge, MD   0.25 mg at 02/22/17 1700   ??? sodium chloride 0.9 % infusion  50 mL/hr Intravenous Continuous Clarisse Gouge, MD 50 mL/hr at 02/22/17 1659 50 mL/hr at 02/22/17 1659   ??? sodium chloride flush 10 mL  10 mL Intravenous QS Ranjit Katneni, MD   10 mL at 02/23/17 0523         Exam:  Vitals:  Patient Vitals for the past 4 hrs:   BP Temp Temp src Pulse Resp SpO2   02/23/17 0847 146/83 97.8 ??F (36.6 ??C) Oral 62 17 100 %          Physical Exam   Constitutional: She is oriented to person, place, and time. She appears well-developed and well-nourished.   HENT:   Head: Normocephalic and atraumatic.   Eyes: Conjunctivae are normal.   Double vision   Neck: No JVD present.   Cardiovascular: Normal rate, regular rhythm and normal heart sounds.    No murmur heard.  Pulmonary/Chest: Effort normal and breath sounds normal.   Abdominal: Soft. Bowel sounds are normal.   Musculoskeletal: Normal range of motion. She exhibits no edema.   Neurological: She is alert and oriented to person, place, and time.   Skin: Skin is warm and dry.   Psychiatric: She has a normal mood and affect.       Labs:   Lab Results   Component Value Date    TROPONINI <0.04 02/19/2017     Lab Results   Component Value Date    GLUCOSE 102 (H) 02/20/2017    BUN 22 02/20/2017    CO2 25 02/20/2017    CREATININE 1.19 02/20/2017    K 4.3 02/20/2017    NA 138 02/20/2017    CL 105 02/20/2017    CALCIUM 9.4 02/20/2017     Lab Results   Component Value Date    MG 2.1 11/26/2016     Lab Results   Component Value Date    PHOS 3.5 11/26/2016     Lab Results   Component Value Date    WBC 11.8 (H) 02/19/2017    HGB 15.0 02/19/2017    HCT 44.3 02/19/2017    MCV 89.8 02/19/2017    PLT 204 02/19/2017     No results found for: PTT, INR  No results found  for: BNP  Lab Results   Component Value Date    ALT 14 12/29/2016    AST 11 (L) 12/29/2016    ALKPHOS 69 12/29/2016    BILITOT 0.8 12/29/2016     Lab Results   Component Value Date    CHOLTOT 174 02/22/2017    TRIG 249 (H) 02/22/2017    HDL 41 (L) 02/22/2017    LDL 83 02/22/2017     No results found for: TSH       Imaging:    Last ECHO: 02/22/17  Left ventricle: The cavity size was normal. Wall thickness was  ????normal. Systolic function was normal. The estimated ejection  ????fraction was  in the range of 60% to 65%. Wall motion was normal;  ????there were no regional wall motion abnormalities.There was a  ????small, well circumscribed echodensity at the left ventricular  ????apex, of uncertain etiology. There are no accompanying wall motion  ????abnormalities to suggest an apical thrombus. Doppler parameters  ????are consistent with abnormal left ventricular relaxation (grade 1  ????diastolic dysfunction).  - Mitral valve: Mild regurgitation.  - Right ventricle: Systolic function was normal.  - Atrial septum: No defect or patent foramen ovale was identified.  - Pulmonic valve: Peak gradient (S): 4mm Hg.    Recommendations: ??This procedure has been discussed with the  referring physician, Dr. Bufford Spikes.      CTA 02/19/17  IMPRESSION:  ??  Mild luminal irregularity of the left vertebral artery potentially due to atherosclerotic disease, with a subtle fusiform dilatation near foramen magnum. Otherwise normal CTA of the head with no evidence of intracranial stenosis, occlusive change, or AV shunting lesion.      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

## 2017-02-23 NOTE — Progress Notes (Signed)
[facility]  Progress Note      Subjective:  Interval History: double vision improved  S/p  LP today   MRI with out stroke with MRA with ?compression of 6th nerve    Echo with no evidence of thrombus     Continuous Infusions:   sodium chloride 0.9 % 50 mL/hr (02/23/17 1214)       Objective:    Vital signs in last 24 hours:  Temp:  [97.7 F (36.5 C)-98.3 F (36.8 C)] 97.9 F (36.6 C)  Heart Rate:  [58-78] 72  Resp:  [16-18] 16  BP: (113-146)/(68-96) 120/68    Intake/Output last 3 shifts:  I/O last 3 completed shifts:  In: 2901 [P.O.:600; I.V.:2301]  Out: -   Intake/Output this shift:  I/O this shift:  In: 785.8 [P.O.:480; I.V.:305.8]  Out: -   Last BMP Result:      Lab 02/20/17  0725   SODIUM 138   POTASSIUM 4.3   CHLORIDE 105   CO2 25   BUN 22   CREATININE 1.19   CALCIUM 9.4     Last CBC Result:      Lab 02/19/17  1808   WBC 11.8*   HEMATOCRIT 44.3   PLATELETS 204   RBC 4.94     No results for input(s): PROTIME, INR in the last 72 hours.    Physical Exam:    Vitals:    02/23/17 0247 02/23/17 0847 02/23/17 1237 02/23/17 1559   BP: 113/73 146/83 (!) 136/96 120/68   BP Location: Left arm Right arm Right arm Right arm   Patient Position: Lying Lying Lying Lying   Pulse: 62 62 58 72   Resp:  17 16 16    Temp: 98.3 F (36.8 C) 97.8 F (36.6 C) 97.7 F (36.5 C) 97.9 F (36.6 C)   TempSrc: Oral Oral Oral Oral   SpO2: 97% 100% 97% 99%   Weight:       Height:         General appearance : comfortable, left eye covered with patch   L:CTA, no W/R/R, not labored  H:RRR, S1S2  RUE:AVWU ND/NT, normal BS  Ext: no edema    Assessment:    Principal Problem:    Cerebrovascular accident (CVA) determined by clinical assessment (CMS Dx)  ?compression of 6 th nerve right   lung cancer s/p R middle lobectomy   HTN  HLD  Depression     Plan:  medsurg  Neurology input appreciated  MRI negative for acute stroke  MRA with ?compression of sixth nerve right , s/p LP   Contin aspirin Lipitor   Continue home meds for  depression   Code status Full  DVT prophylaxis  Heparin   Possible discharge today if ok with neurology

## 2017-02-23 NOTE — Unmapped (Signed)
Patient was awake on assessment after received report from [place], Charity fundraiser. Patient was calm and pleasant. No signs or symptoms of distress at this time. Patinet waitn rest of results from Lumbar Puncture and hoping to go home today.

## 2017-02-23 NOTE — Unmapped (Signed)
Physical Therapy  Physical Therapy Initial Assessment/Discharge     Name: Joanna Lopez  DOB: 1950/10/02  Attending Physician: Maretta Bees, MD  Admission Diagnosis: Diplopia [H53.2]  Date: 02/23/2017  Reviewed Pertinent hospital course: Yes  Hospital Course PT/OT: Joanna Lopez is a 66 y.o. female with past medical history of facial basal cell cancer, HTN,HLD, OSA, IBS, recently diagnosed lung cancer s/p R middle lobectomy who presents with double vision and mild headache.   Precautions: AAT, double vision  Assessment  PT 6 Clicks  Help From Another Person Turning From Back to Side While Flat in Bed Without Using Siderails: None  Help From Another Person Moving From Lying On Back To Sitting Without Using Siderails: None  Help From Another Person Moving To And From Bed To Chair: None  Help From Another Person Standing Up From Chair Using Your Arms: None  Help From Another Person To Walk In Hospital Room: None  Help From Another Person Climbing 3-5 Steps With A Railing: None  PT 6 Clicks Score: 24     Goals    no acute care PT goals identified    Recommendation  Plan  PT Frequency: One time visit    Recommendation  Recommendation: Home independently, No skilled PT  No Skilled PT: No acute PT goals identified (Pt is independent with all mobility)  Equipment Recommended: None  Problem List  Patient Active Problem List   Diagnosis   ??? GERD (gastroesophageal reflux disease)   ??? Lung nodule, solitary   ??? Diarrhea   ??? Acute respiratory insufficiency   ??? Essential hypertension   ??? Pain management   ??? Malignant neoplasm of middle lobe of right lung (CMS Dx)   ??? Cerebrovascular accident (CVA) determined by clinical assessment (CMS Dx)      Past Medical History  Past Medical History:   Diagnosis Date   ??? Basal cell cancer    ??? GERD (gastroesophageal reflux disease)    ??? Hyperlipidemia    ??? Hypertension    ??? IBS (irritable bowel syndrome)    ??? OSA (obstructive sleep apnea)       Past Surgical  History  Past Surgical History:   Procedure Laterality Date   ??? APPENDECTOMY     ??? COLONOSCOPY N/A 01/23/2013    Procedure: COLONOSCOPY WITH MAC;  Surgeon: Lurene Shadow, MD;  Location: [facility] ENDOSCOPY;  Service: Gastroenterology;  Laterality: N/A;   ??? FLEXIBLE BRONCHOSCOPY W/ UPPER ENDOSCOPY N/A 09/17/2016    Procedure: BRONCHOSCOPY SUPER D WITH FLUORO AND BIOPSY-EBUS/NAVI;  Surgeon: Johnny Bridge, MD;  Location: [facility] ENDOSCOPY;  Service: Pulmonary;  Laterality: N/A;   ??? submastoid gland removal     ??? THORACOSCOPY Right 11/23/2016    Procedure: RIGHT SIDED VATS MIDDLE LOBE LOBECTOMY,HILAR MEDIASTINAL LYMPH NODE DISSECTION;  Surgeon: Janett Billow, MD;  Location: [facility] OR;  Service: Thoracic;  Laterality: Right;   ??? TONSILLECTOMY     ??? 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 parkinsons and lewy body dementia     Pain  Pain Score: 0-No pain    Vision  Current Vision:  (Pt is experiencing double vision and is currently weaing an eye patch to compensate)    Cognition  Orientation Level: Oriented X4    Sensation  Light Touch: No apparent deficits     Inattention/Neglect: Appears intact  Initiation: Appears intact  Motor Planning: Appears intact  Perseveration: Not present              Lower Extremity  RLE Assessment  RLE Assessment: Within Functional Limits  LLE Assessment  LLE Assessment: Within Functional Limits  Functional Mobility  Bed Mobility Eval  Rolling: Independent  Supine to Sit: Independent  Sit to Supine: Independent  Transfers Eval  Sit to Stand:  Independent  Gait Eval  Gait Assistance Eval: Independent  Assistive Device Eval: None  Distance Eval: 500'  Stair Management Technique Eval: Alternating pattern;One rail L  Stair Management Assistance Eval: Modified independent (Device)  Number of Stairs Eval: 4  Balance Eval  Sitting - Static: Independent  Sitting-Dynamic: Independent   Standing-Static: Independent  Standing-Dynamic: Independent (Pt even demonstrated backing up and turning around without any LOB)     Patient Education   role of PT. Educated to seek out someone who does vision therapy if double vision does not improve    Position After Physical Therapy session:   Pt left seated EOB - RN has pt up ad lib  Call light and phone / communication device placed within patient's reach.      Handoff of Care:  Safety Handoff completed with RN after Rehabilitation session.      Time  Start Time: 1438  Stop Time: 1451  Time Calculation (min): 13 min    Charges   $PT Evaluation Low Complex 20 Min: 1 Procedure      CPT codes  A. Personal factors and/or Comorbidities / Patient History that impacts plan of care:  See above PMH / PSH / and problem list.   Moderate Complexity: 1-2         B. An examination of body systems(s) musculoskeletal, neuromuscular, cardiovascular/pumonary, integumentary using standardized tests and measures:  Refer to above examination for details.     Low Complexity: 1-2 elements    C. A clinical presentation with:   Low Complexity:  Stable and/or uncomplicated Characteristics      D. Clinical decision making using standardized patient assessment instrument and/or measurable assessment of functional outcome.   Low Complexity 97161      The pt is independent with all functional mobility. Skilled PT is not warranted at this time and is discharged from PT services.  [facility]  Phone- [phone]  02/23/2017

## 2017-02-23 NOTE — Unmapped (Signed)
Problem: Occupational Therapy  Goal: Encourage Increased Activity  Encourage increased activity to promote independence with ADLs  Outcome: Adequate for Discharge Date Met: 02/23/17

## 2017-02-23 NOTE — Unmapped (Signed)
[facility] FOLLOW-UP CONSULT NOTE      INTERVAL HISTORY:  CC:  Double vision    Double vision stable.     ROS:  General: no fevers, chills  Neurological: no changes in behavior, no new numbness, sensory changes, weakness, vision changes, speech problems    MEDICATIONS  Scheduled Meds:  ??? aspirin  325 mg Oral Daily 0900   ??? atorvastatin  80 mg Oral Nightly (2100)   ??? citalopram  10 mg Oral Daily 0900   ??? dicyclomine  20 mg Oral 4x Daily WC   ??? docusate sodium  100 mg Oral BID   ??? heparin (porcine)  5,000 Units Subcutaneous Q8H   ??? hydroCHLOROthiazide  25 mg Oral Daily 0900   ??? lipase-protease-amylase  2 capsule Oral 4x Daily WC   ??? lisinopril  10 mg Oral Daily 0900   ??? melatonin  3 mg Oral Nightly (2100)   ??? methocarbamol  500 mg Oral Q6H   ??? multivitamin with folic acid  1 tablet Oral Daily 0900   ??? oxymetazoline  2 spray Nasal BID   ??? pantoprazole  40 mg Oral Daily 0900   ??? PARoxetine  10 mg Oral QAM   ??? roPINIRole  0.25 mg Oral Daily with dinner   ??? sodium chloride  10 mL Intravenous QS     Continuous Infusions:  ??? sodium chloride 0.9 % 50 mL/hr (02/23/17 1638)     PRN Meds:.acetaminophen, magnesium hydroxide     MEDICATIONS PRIOR TO ADMISSION:  Prescriptions Prior to Admission   Medication Sig Dispense Refill Last Dose   ??? dicyclomine (BENTYL) 20 mg tablet Take 20 mg by mouth 4 times a day with meals and at bedtime.   02/19/2017 at Unknown time   ??? hydroCHLOROthiazide (HYDRODIURIL) 25 MG tablet    02/19/2017 at Unknown time   ??? lipase-protease-amylase (CREON) 24,000-76,000 -120,000 unit CpDR Take 2 capsules po with each meal. (Patient taking differently: Take 2 capsules po with each meal.  Taking PRN Indications: exocrine pancreatic insufficiency         ) 180 capsule 6 02/19/2017 at Unknown time   ??? lisinopril (PRINIVIL,ZESTRIL) 20 MG tablet Take 10 mg by mouth daily. 10 mg (half tablet) daily   02/19/2017 at Unknown time   ??? melatonin 3 mg Tab Take 3 mg by mouth at bedtime. PRN for sleep   02/18/2017 at  Unknown time   ??? oxymetazoline (AFRIN) 0.05 % nasal spray Use 2 sprays into each nostril 2 times a day. Nightly for sleep   Past Week at Unknown time   ??? PARoxetine (PAXIL) 10 MG tablet Take 10 mg by mouth every morning.   02/19/2017 at Unknown time   ??? roPINIRole (REQUIP) 0.25 MG tablet Take 1 tablet (0.25 mg total) by mouth daily with dinner. Take one tablet (0.25 mg) 1-2 hours before bedtime, for three days, then take 2 tablets (0.5 mg) 1-2 hours before bedtime for four days, then take 4 tablets (1 mg) 1-2 hours before bedtime thereafter 95 tablet 5 Past Week at Unknown time   ??? acetaminophen (TYLENOL) 325 MG tablet Take 3 tablets (975 mg total) by mouth every 6 hours as needed. 60 tablet 0 Taking   ??? multivitamin (MULTIVITAMIN) tablet Take 1 tablet by mouth daily.   Unknown at Unknown time        ALLERGIES:  Formaldehyde analogues; Penicillins; Sulfa (sulfonamide antibiotics); and Latex    PHYSICAL EXAM:  BP (!) 136/92 (BP Location: Right arm,  Patient Position: Lying)    Pulse 63    Temp 97.7 ??F (36.5 ??C) (Oral)    Resp 16    Ht 5' 7 (1.702 m)    Wt (!) 222 lb 4 oz (100.8 kg)    SpO2 97%    BMI 34.81 kg/m??     General Exam  GEN: NAD  Pulm: breathing comfortably, on RA  Extr: no clubbing, cyanosis or edema    Brief Neurological Exam:  Mental Status: alert and oriented to person, place, time and situation. Attention and language appropriate.   Cranial Nerves: Extraocular movements appear intact, though she notes double vision with forward gaze as well as slight gaze to right and more prominent on gaze to left- less than yesterday. Visual fields intact to finger confrontation. Face symmetric. Hearing intact to voice. Tongue midline.  Motor: R / L (MRC Scale)   Deltoid 5 / 5                     Hip Flexion 5 / 5   Biceps 5 / 5                     Knee extension 5 / 5   Triceps 5 / 5                    Knee flexion 5 / 5   Wrist Extensors 5 / 5      Plantar flexion 5 / 5   Interosei 5 / 5                   Ankle  dorsiflex 5 / 5   Sensory: light touch intact bilaterally upper and lower extremities  Coord: no dysmetria or tremor noted  Reflexes: reflexes are symmetric bilaterally upper and lower extremities at 2+  Gait: steady, no truncal ataxia    LABS:    No results for input(s): WBC, HGB, HCT, PLT in the last 72 hours.                                                               No results for input(s): NA, K, CL, CO2, BUN, CREATININE, GLUCOSE in the last 72 hours.    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 Tortuosity of the basilar artery causes mild displacement of the right 6th cranial nerve, of uncertain relation to clinical symptomatology. A variant of vascular compression syndrome cannot be excluded. Report Verified by: Veto Kemps, M.D. at 02/20/2017 3:30 PM EDT    CT head results:  No results found for the past 12 months  Brain vessel imaging results:  No results found for the past 12  months    ECHO 02/22/2017  Study Conclusions    - Left ventricle: The cavity size was normal. Wall thickness was  ????normal. Systolic function was normal. The estimated ejection  ????fraction was in the range of 60% to 65%. Wall motion was normal;  ????there were no regional wall motion abnormalities.There was a  ????small, well circumscribed echodensity at the left ventricular  ????apex, of uncertain etiology. There are no accompanying wall motion  ????abnormalities to suggest an apical thrombus. Doppler parameters  ????are consistent with abnormal left ventricular relaxation (grade  1  ????diastolic dysfunction).  - Mitral valve: Mild regurgitation.  - Right ventricle: Systolic function was normal.  - Atrial septum: No defect or patent foramen ovale was identified.  - Pulmonic valve: Peak gradient (S): 4mm Hg.    IMPRESSION :  Joanna Lopez is a 66 y.o. female with past medical history of facial basal cell cancer, HTN,HLD, OSA, IBS, recently diagnosed lung cancer s/p R middle lobectomy who presents with double vision and mild headache, more mild and different than typical migraines. On initial exam findings are subtle- she notes double vision worse with looking left, resolved with rightward gaze, ?subtle inability of left eye to fully abduct. No other findings thought ED documented ataxia on left. MRI without stroke but MRA with ?vascular compression of right sixth. This does not fit with exam. MRI negative stroke, ischemic vs increased pressure vs other such as paraneoplastic (recently diagnosed with lung cancer) vs less likely CTA negative aneurysm- headache very mild. ?funcitonal contribution given subtle deficits and somewhat variable description of double vision during exam with significant life stressors of late. The fact that ED documented left sided ataxia, now resolved, suggests TIA.     LP with normal preliminary studies including protein, WBC and RBC. Cytology negative.   ??  ??  RECOMMENDATIONS :  ??  ?? Lumbar puncture w/ basic studies normal: remaining studies-, IgG index, o bands, cytology, cytom and paraneoplastic panel pending and will need follow up as outpatient in neurology clinic, additional CSF held for further studies    ?? Recommend follow up with neuro-ophthalmology for full exam. Referral provided.  ?? Follow up with neurology clinic as well. Discussed with Dr. Ladell Pier- appreciate willingness to follow. I provided the patient with numbers for scheduling.   ??  ?? Possible Stroke/TIA diagnostic:  ?? telemetry monitoring, Q4H neuro checks  ?? MRI head without  contrast - MRI without stroke but MRA with ?vascular compression of right sixth nerve- this would not fit with symptoms. I discussed imaging with vascular,. There is no compression of the left sixth nerve. We discussed fiesta sequence which was already completed on MRI.  ?? CTA head/neck- no significant stenosis or aneurysm.  I discussed luminal irregularly of left vertebral a. With neurointerventional given recent chiropractic work;  did not recommend further evaluation,continue aspirin  ?? TTE with bubble to evaluate for cardiac embolic source - normal EF, no PFO, normal sized atria, ? Echodensity at LV apex- discussed with cardiology- not consistent with clot, more likely fat muscle, possibly artifact- repeat echo negative. Recommended no further evaluation.   ?? Stroke risk factor stratification: HbA1c 5.5 and LDL 83; non smoker  ?? ??  ?? Therapeutic:  ?? Secondary stroke prevention: aspirin 81 mg daily and will increase atorvastatin to 80 mg  ?? Blood pressure:  ok to gradually lower to goal  ?? Maintain blood glucose < 180 with SSI if needed  ?? PT/OT/SLP evaluation and treatment   ?? ??  ??  Stroke risk factor modification:  ?? Goal BP < 130/80   ?? Goal LDL <70, Goal HDL>50   ?? Goal HgbA1c <6.5  ?? Goal BMI 25 kg/m2     ?? Discussed importance of mental health treatment. Agreeable to therapy. Referral provided.   ??  This patient encounter was 60 minutes and >50% of the encounter was spent discussing the topics detailed in the assessment and plan.      I have reviewed and updated the history, physical exam, data, assessment, and plan of  the note so that it reflects the evaluation and management of the patient by me on 02/23/2017.      Martin Smeal Carmine Savoy, MD  [facility]  02/24/2017

## 2017-02-23 NOTE — Progress Notes (Signed)
CASE MANAGEMENT DAILY NOTE     ADMIT DT: 02/19/2017  ADMIT DX: @Diplopia  [H53.2]  PMH:  has a past medical history of Basal cell cancer; GERD (gastroesophageal reflux disease); Hyperlipidemia; Hypertension; IBS (irritable bowel syndrome); and OSA (obstructive sleep apnea). lobectomy for lung cancer this year  PAYOR: Payor: MEDICARE / Plan: MEDICARE A AND B / Product Type: Medicare /   PRIOR LIVING SITUATION:  From home with spouse, spouse is of limited ability to assist    Inpatient day 4. Continues with double vision. Met with patient, family. Patient to call eye doctor for recommendation for optical therapy. No PT/OT/DME needs. Patient and family most concerned about additional support for spouse who has Parkinson's and Lewy body disease. Provided [facility] adult day program information, companionship/HHA agency. Patient expects to move into a patio home at [place] and [place] by the end of the year.      Discharge Plan     Return home.    Will continue to monitor and assist with CM and discharge needs.     Bonna Gains, BSN, CCM  Case Manager  [phone] Ascom  [phone] Fax

## 2017-02-23 NOTE — Progress Notes (Signed)
Occupational Therapy   Reason Patient Not Seen     Name: Joanna Lopez  DOB: 03/11/1951  Attending Physician: Maretta Bees, MD  Admission Diagnosis: Diplopia [H53.2]  Date: 02/23/2017  Precautions:    Reviewed Pertinent hospital course: Yes    Unable to see patient due to: pt off floor, will follow up     Donnie Aho, OTR/L  License # [id number]  02/23/2017

## 2017-02-23 NOTE — Progress Notes (Signed)
Occupational Therapy  Occupational Therapy Initial Assessment and DC     Name: Joanna Lopez  DOB: [birth year]  Attending Physician: Maretta Bees, MD  Admission Diagnosis: Diplopia [H53.2]  Date: 02/23/2017  Reviewed Pertinent hospital course: Yes  Hospital Course PT/OT: Joanna Lopez is a 66 y.o. female with past medical history of facial basal cell cancer, HTN,HLD, OSA, IBS, recently diagnosed lung cancer s/p R middle lobectomy who presents with double vision and mild headache.   Precautions: AAT, double vision    Assessment  OT 6 Clicks  Help From Another Person To Put On/Take Off Regular Lower Body Clothing: None  Help From Another Person Bathing ( including washing ,rinsing,drying): None  Help From Another Person Toileting, which includes using the toilet, bedpan or urinal: None  Help From Another Person To Put On/Take Off Regular Upper Body Clothing: None  Help From Another Person Taking Care Of Personal Grooming: None  Help From Another Person Eating Meals: None  OT 6 Clicks Score: 24  Prognosis: Good, With family  Goal Formulation: Patient    Goals  None-eval only    Recommendation  Plan  Progress: Improving as expected  Recommendation  Recommendation: Other (comment) (outpatient vision therapy)  Equipment Recommended: Patient has needed bathroom DME     Problem List  Patient Active Problem List   Diagnosis    GERD (gastroesophageal reflux disease)    Lung nodule, solitary    Diarrhea    Acute respiratory insufficiency    Essential hypertension    Pain management    Malignant neoplasm of middle lobe of right lung (CMS Dx)    Cerebrovascular accident (CVA) determined by clinical assessment (CMS Dx)        Past Medical History  Past Medical History:   Diagnosis Date    Basal cell cancer     GERD (gastroesophageal reflux disease)     Hyperlipidemia     Hypertension     IBS (irritable bowel syndrome)     OSA (obstructive sleep apnea)         Past Surgical History  Past  Surgical History:   Procedure Laterality Date    APPENDECTOMY      COLONOSCOPY N/A 01/23/2013    Procedure: COLONOSCOPY WITH MAC;  Surgeon: Lurene Shadow, MD;  Location: [facility] ENDOSCOPY;  Service: Gastroenterology;  Laterality: N/A;    FLEXIBLE BRONCHOSCOPY W/ UPPER ENDOSCOPY N/A 09/17/2016    Procedure: BRONCHOSCOPY SUPER D WITH FLUORO AND BIOPSY-EBUS/NAVI;  Surgeon: Johnny Bridge, MD;  Location: [facility] ENDOSCOPY;  Service: Pulmonary;  Laterality: N/A;    submastoid gland removal      THORACOSCOPY Right 11/23/2016    Procedure: RIGHT SIDED VATS MIDDLE LOBE LOBECTOMY,HILAR MEDIASTINAL LYMPH NODE DISSECTION;  Surgeon: Janett Billow, MD;  Location: [facility] OR;  Service: Thoracic;  Laterality: Right;    TONSILLECTOMY      UVULOPALATOPHARYNGOPLASTY          Patient Stated Goals  Goal #1: to go home      Home Living/Prior Function  Type of Home: House  Home Layout: Two level (3 STE, 1 flight to bed/bath)  Bathroom Shower/Tub: Pension scheme manager: Midwife: Buyer, retail:  (no DME)  Prior Function  Level of Independence: Independent (no device)  Lives With: Spouse  ADL Assistance: Independent  Homemaking/IADL Assistance: Independent  Comments: 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 parkinsons and lewy body dementia     Pain  Pain Score:  0-No pain     Vision  Current Vision:  (Pt is experiencing double vision and is currently weaing an eye patch to compensate)    Cognition  Overall Cognitive Status: Within Functional Limits  Orientation Level: Oriented X4    Sensation  Light Touch: No apparent deficits    Proprioception  Proprioception  Proprioception: No apparent deficits    Perception  Perception  Inattention/Neglect: Appears intact  Initiation: Appears intact  Motor Planning: Appears intact  Perseveration: Not present    Right Upper Extremity   RUE Assessment: Within Functional Limits         Left Upper Extremity  LUE Assessment: Within Functional Limits         Hand  Function  Gross Grasp: Functional  Coordination: Functional (B UE tremors but does not interfere with function)        Functional Mobility  Bed Mobility Eval  Rolling: Independent  Supine to Sit: Independent  Sit to Supine: Independent  Transfers Eval  Sit to Stand: Independent  Toilet Transfers: Independent  Functional Mobility: Independent  Balance Eval  Sitting - Static: Independent  Sitting-Dynamic: Independent   Standing-Static: Independent  Standing-Dynamic: Independent (Pt even demonstrated backing up and turning around without any LOB)    ADL  Eating Assistance: Independent  Grooming Assistance: Independent  LE Dressing Assistance: Independent  Toileting Assistance with Device: Independent    Patient Education  Patient education provided regarding OT role, ADLs, ADL transfers/mobility, and safety. Pt expressed understanding.  Education provided regarding safety recommendation of using shower seat following d/c and following up with outpatient vision therapy to address double vision. Understanding verbalized.    Pt left in supine with all needs in reach-pt up ad lib. MORSE score of 35. Safe handoff to RN completed.     Discharge  No additional OT services indicated. DC OT services     Time  Start Time: 1438  Stop Time: 1447  Time Calculation (min): 9 min    Charges  $OT Evaluation Low Complex 30 Min: 1 Procedure         CPT codes  56213- Low Complexity Evaluation  This evaluation code was determined based on analysis of pt's occupational profile, performance deficits, and level of clinical decision-making to complete OT evaluation      **Occupational Profile:  Low Complexity - brief chart review      **Performance deficits:   Low Complexity- 1-3 performance deficits      **Clinical Decision-making:   Low Complexity     Co-morbidities affecting pt's current performance: See above for PMH    Current level of physical/ verbal assistance:   Min physical/verbal assistance      Donnie Aho, OTR/L  License #  [id number]  02/23/2017

## 2017-02-23 NOTE — Unmapped (Signed)
Problem: Pain  Goal: Patient's pain is progressing toward patient's stated pain goal  Assess and monitor patient's pain using appropriate pain scale. Collaborate with interdisciplinary team and initiate plan and interventions as ordered. Re-assess patient's pain level 30 - 60 minutes after pain management intervention.    Outcome: Progressing      Problem: Safety  Goal: Patient will be injury free during hospitalization  Assess and monitor vitals signs, neurological status including level of consciousness and orientation. Assess patient's risk for falls and implement fall prevention plan of care and interventions per hospital policy.      Ensure arm band on, uncluttered walking paths in room, adequate room lighting, call light and overbed table within reach, bed in low position, wheels locked, side rails up per policy, and non-skid footwear provided.    Outcome: Progressing

## 2017-02-23 NOTE — Nursing Note (Signed)
Assessment completed. See doc flowsheets. Pt. On bed. A/O x4, denies any pain or SOB. POC discussed with pt. Verbalized understanding. Will continue to monitor. Call light in reach.

## 2017-02-24 MED ORDER — aspirin 325 MG tablet
325 | ORAL_TABLET | Freq: Every day | ORAL | 0 refills | 30.00000 days | Status: AC
Start: 2017-02-24 — End: ?

## 2017-02-24 MED ORDER — atorvastatin (LIPITOR) 80 MG tablet
80 | ORAL_TABLET | Freq: Every evening | ORAL | 0 refills | Status: AC
Start: 2017-02-24 — End: ?

## 2017-02-24 MED FILL — ASPIRIN 325 MG TABLET: 325 325 MG | ORAL | Qty: 1

## 2017-02-24 MED FILL — CITALOPRAM 10 MG TABLET: 10 10 MG | ORAL | Qty: 1

## 2017-02-24 MED FILL — THERA 400 MCG TABLET: 400 400 mcg | ORAL | Qty: 1

## 2017-02-24 MED FILL — PAROXETINE 20 MG TABLET: 20 20 MG | ORAL | Qty: 1

## 2017-02-24 MED FILL — PANTOPRAZOLE 40 MG TABLET,DELAYED RELEASE: 40 40 MG | ORAL | Qty: 1

## 2017-02-24 MED FILL — HEPARIN (PORCINE) 5,000 UNIT/ML INJECTION SOLUTION: 5000 5,000 unit/mL | INTRAMUSCULAR | Qty: 1

## 2017-02-24 MED FILL — HYDROCHLOROTHIAZIDE 25 MG TABLET: 25 25 MG | ORAL | Qty: 1

## 2017-02-24 MED FILL — LISINOPRIL 10 MG TABLET: 10 10 MG | ORAL | Qty: 1

## 2017-02-24 MED FILL — DOCUSATE SODIUM 100 MG CAPSULE: 100 100 MG | ORAL | Qty: 1

## 2017-02-24 MED FILL — CREON 24,000-76,000-120,000 UNIT CAPSULE,DELAYED RELEASE: 24000-76000 24,000-76,000 -120,000 unit | ORAL | Qty: 2

## 2017-02-24 MED FILL — DICYCLOMINE 20 MG TABLET: 20 20 mg | ORAL | Qty: 1

## 2017-02-24 MED FILL — METHOCARBAMOL 500 MG TABLET: 500 500 MG | ORAL | Qty: 1

## 2017-02-24 NOTE — Unmapped (Addendum)
Cardiology Progress Note    Joanna Lopez  24-Nov-1950  [id number]    Subjective:  Ptiis resting in bed. No complaints. Reviewed echo results and POC.    Objective:  Physical Exam:  Vitals:    02/23/17 2020 02/23/17 2323 02/24/17 0442 02/24/17 0757   BP: 149/74 132/73 143/74 (!) 136/92   BP Location: Right arm Right arm Right arm Right arm   Patient Position: Lying Lying Lying Lying   Pulse: 66 64 57 63   Resp: 17 16 16 16    Temp: 98.4 ??F (36.9 ??C) 98.4 ??F (36.9 ??C) 97.8 ??F (36.6 ??C) 97.7 ??F (36.5 ??C)   TempSrc: Oral Oral Oral Oral   SpO2: 99% 97% 97% 97%   Weight:   (!) 222 lb 4 oz (100.8 kg)    Height:           Physical Exam   Constitutional: She is oriented to person, place, and time. She appears well-developed and well-nourished.   HENT:   Head: Normocephalic and atraumatic.   Eyes:   Double vision   Neck: Normal range of motion. Neck supple. No JVD present.   Cardiovascular: Normal rate, regular rhythm and normal heart sounds.    No murmur heard.  Pulmonary/Chest: Effort normal and breath sounds normal.   Abdominal: Soft. Bowel sounds are normal.   Musculoskeletal: Normal range of motion. She exhibits no edema.   Neurological: She is alert and oriented to person, place, and time.   Skin: Skin is warm and dry.   Psychiatric: She has a normal mood and affect.   Nursing note and vitals reviewed.      Lab Review       Lab 02/20/17  0725 02/19/17  1808   SODIUM 138 139   POTASSIUM 4.3 3.9   CHLORIDE 105 105   CO2 25 23   BUN 22 24   CREATININE 1.19 1.25   GLUCOSE 102* 110*   CALCIUM 9.4 10.1     Lab Results   Component Value Date    ALT 14 12/29/2016    AST 11 (L) 12/29/2016    ALKPHOS 69 12/29/2016    BILITOT 0.8 12/29/2016     Lab Results   Component Value Date    MG 2.1 11/26/2016     No results found for: BNP      Lab 02/19/17  1808   WBC 11.8*   RBC 4.94   HEMOGLOBIN 15.0   HEMATOCRIT 44.3   PLATELETS 204         Lab Results   Component Value Date    TROPONINI <0.04 02/19/2017     No results found for:  TSH  Lab Results   Component Value Date    CHOLTOT 174 02/22/2017    TRIG 249 (H) 02/22/2017    HDL 41 (L) 02/22/2017    LDL 83 02/22/2017         Imaging  Chest X-Ray: none new     Cardiographics  ECG: none new    Echocardiogram: Impressions: ??No evidence of apical thrombus on definity echo          Scheduled Meds:  ??? aspirin  325 mg Oral Daily 0900   ??? atorvastatin  80 mg Oral Nightly (2100)   ??? citalopram  10 mg Oral Daily 0900   ??? dicyclomine  20 mg Oral 4x Daily WC   ??? docusate sodium  100 mg Oral BID   ??? heparin (porcine)  5,000 Units Subcutaneous  Q8H   ??? hydroCHLOROthiazide  25 mg Oral Daily 0900   ??? lipase-protease-amylase  2 capsule Oral 4x Daily WC   ??? lisinopril  10 mg Oral Daily 0900   ??? melatonin  3 mg Oral Nightly (2100)   ??? methocarbamol  500 mg Oral Q6H   ??? multivitamin with folic acid  1 tablet Oral Daily 0900   ??? oxymetazoline  2 spray Nasal BID   ??? pantoprazole  40 mg Oral Daily 0900   ??? PARoxetine  10 mg Oral QAM   ??? roPINIRole  0.25 mg Oral Daily with dinner   ??? sodium chloride  10 mL Intravenous QS     Continuous Infusions:  ??? sodium chloride 0.9 % 50 mL/hr (02/23/17 1638)     PRN Meds:acetaminophen, magnesium hydroxide      Assessment:  ?? 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 No abnormal rhythms noted on the monitor  ?? HTN - stable  ?? HLD - on high dose statin  ?? ? Apical thrombus    Plan:  Limited echo as above, no evidence of apical thrombus  Cont asa, statin, lisinopril  No indication for systemic anticoagulation for now  Okay to d/c. Likely 30 day event monitor post discharge     Orland Mustard, NP  02/24/2017  8:41 AM     I have seen and examined the patient and agree with the house staff assessment and plan     No evidence of apical thrombus. Will consider outpatient event monitor

## 2017-02-24 NOTE — Unmapped (Signed)
Ischemic Stroke  An ischemic stroke is the sudden death of brain tissue. Blood carries oxygen to all areas of the body. This type of stroke happens when your blood does not flow to your brain like normal. Your brain cannot get the oxygen it needs. This is an emergency. It must be treated right away.  Symptoms of a stroke usually happen all of a sudden. You may notice them when you wake up. They can include:  ?? Weakness or loss of feeling in your face, arm, or leg. This often happens on one side of the body.  ?? Trouble walking.  ?? Trouble moving your arms or legs.  ?? Loss of balance or coordination.  ?? Feeling confused.  ?? Trouble talking or understanding what people are saying.  ?? Slurred speech.  ?? Trouble seeing.  ?? Seeing two of one object (double vision).  ?? Feeling dizzy.  ?? Feeling sick to your stomach (nauseous) and throwing up (vomiting).  ?? A very bad headache for no reason.  Get help as soon as any of these problems start. This is important. Some treatments work better if they are given right away. These include:  ?? Aspirin.  ?? Medicines to control blood pressure.  ?? A shot (injection) of medicine to break up the blood clot.  ?? Treatments given in the blood vessel (artery) to take out the clot or break it up.  Other treatments may include:  ?? Oxygen.  ?? Fluids given through an IV tube.  ?? Medicines to thin out your blood.  ?? Procedures to help your blood flow better.  What increases the risk?  Certain things may make you more likely to have a stroke. Some of these are things that you can change, such as:  ?? Being very overweight (obesity).  ?? Smoking.  ?? Taking birth control pills.  ?? Not being active.  ?? Drinking too much alcohol.  ?? Using drugs.  Other risk factors include:  ?? High blood pressure.  ?? High cholesterol.  ?? Diabetes.  ?? Heart disease.  ?? Being African American, Native American, Hispanic, or [place] Native.  ?? Being over age 60.  ?? Family history of stroke.  ?? Having had blood clots, stroke,  or warning stroke (transient ischemic attack, TIA) in the past.  ?? Sickle cell disease.  ?? Being a woman with a history of high blood pressure in pregnancy (preeclampsia).  ?? Migraine headache.  ?? Sleep apnea.  ?? Having an irregular heartbeat (atrial fibrillation).  ?? Long-term (chronic) diseases that cause soreness and swelling (inflammation).  ?? Disorders that affect how your blood clots.  Follow these instructions at home:  Medicines  ?? Take over-the-counter and prescription medicines only as told by your doctor.  ?? If you were told to take aspirin or another medicine to thin your blood, take it exactly as told by your doctor.  ?? Taking too much of the medicine can cause bleeding.  ?? If you do not take enough, it may not work as well.  ?? Know the side effects of your medicines. If you are taking a blood thinner, make sure you:  ?? Hold pressure over any cuts for longer than usual.  ?? Tell your dentist and other doctors that you take this medicine.  ?? Avoid activities that may cause damage or injury to your body.  Eating and drinking  ?? Follow instructions from your doctor about what you cannot eat or drink.  ?? Eat healthy foods.  ??   If you have trouble with swallowing, do these things to avoid choking:  ?? Take small bites when eating.  ?? Eat foods that are soft or pureed.  Safety  ?? Follow instructions from your health care team about physical activity.  ?? Use a walker or cane as told by your doctor.  ?? Keep your home safe so you do not fall. This may include:  ?? Having experts look at your home to make sure it is safe.  ?? Putting grab bars in the bedroom and bathroom.  ?? Using raised toilets.  ?? Putting a seat in the shower.  General instructions  ?? Do not use any tobacco products.  ?? Examples of these are cigarettes, chewing tobacco, and e-cigarettes.  ?? If you need help quitting, ask your doctor.  ?? Limit how much alcohol you drink. This means no more than 1 drink a day for nonpregnant women and 2 drinks a day  for men. One drink equals 12 oz of beer, 5 oz of wine, or 1?? oz of hard liquor.  ?? If you need help to stop using drugs or alcohol, ask your doctor to refer you to a program or specialist.  ?? Stay active. Exercise as told by your doctor.  ?? Keep all follow-up visits as told by your doctor. This is important.  Get help right away if:  ?? You suddenly:  ?? Have weakness or loss of feeling in your face, arm, or leg.  ?? Feel confused.  ?? Have trouble talking or understanding what people are saying.  ?? Have trouble seeing.  ?? Have trouble walking.  ?? Have trouble moving your arms or legs.  ?? Feel dizzy.  ?? Lose your balance or coordination.  ?? Have a very bad headache and you do not know why.  ?? You pass out (lose consciousness) or almost pass out.  ?? You have jerky movements that you cannot control (seizure).  These symptoms may be an emergency. Do not wait to see if the symptoms will go away. Get medical help right away. Call your local emergency services (911 in the [place] Do not drive yourself to the hospital.  This information is not intended to replace advice given to you by your health care provider. Make sure you discuss any questions you have with your health care provider.  Document Released: 04/16/2011 Document Revised: 10/08/2015 Document Reviewed: 07/24/2015  [employer] Interactive Patient Education ?? 2018 [employer].  Follow up with neuro-ophthalmology. Referral placed.    Follow up with neurology with Dr. Ladell Pier. [phone] to schedule.

## 2017-02-24 NOTE — Unmapped (Signed)
[facility]  Discharge Summary    Patient ID:  Joanna Lopez  [id number]  66 y.o.  [birth year]    Admit date: 02/19/2017    Discharge date and time: 02/24/2017   9:26 AM  Admitting Physician: Clarisse Gouge, MD     Discharge Physician: Ruthy Dick Jacarius Handel      Discharge Diagnoses: Principal Problem:    Cerebrovascular accident (CVA) determined by clinical assessment (CMS Dx)  compression of 6 th nerve right   lung cancer s/p R middle lobectomy   HTN  HLD  Depression    Discharged Condition: good   Consults: cardiology and neurology    Significant Diagnostic Studies: See Epic for details and results    Imaging :Radiology: Ct Angio Head W And Or Wo    Result Date: 02/19/2017  EXAM: CTA NECK W AND OR WO CONTRAST . TECHNIQUE: Axial thin section CT angiography of the neck was performed with 100 mL of IOHEXOL 350 MG IODINE/ML INTRAVENOUS SOLUTION. 3D reconstructions including MIP and curved MPR images were reconstructed on a separate 3D workstation using a radiologist approved protocol and were interpreted along with source images. INDICATION:  New onset of diplopia with clinical concern for stroke.  FINDINGS: Standard arch anatomy is evident with no origin stenosis of the innominate, bilateral common carotid, subclavian, or vertebral arteries. The right vertebral artery is markedly hypoplastic. The left vertebral artery is tortuous proximally without significant kinking. Mild calcified plaques involve the aortic arch with no dilation or dissection of the aortic arch included. The the included pulmonary arteries enhance normally. The bilateral common, internal, and external carotid arteries history normally. The bilateral vertebral arteries enhance normally. Multilevel cervical degenerative disc disease and facet arthrosis is present with no high-grade canal narrowing at any level. Multilevel neural foraminal stenosis is present. There is likely mild to moderate lower cervical canal narrowing. No  neck mass or adenopathy is included. 3D reconstructions including MIP and curved MPR images reconstructed on a separate 3D workstation using a radiologist approved protocol show no evidence of dissection or aneurysm. IMPRESSION: Minimal atherosclerotic disease of the aortic arch with no hemodynamically significant carotid or vertebral artery stenosis. No dissection or pseudoaneurysm is seen. EXAM: CTA HEAD W AND OR WO CONTRAST . TECHNIQUE: Axial thin section CT angiography of the head was performed with 100 mL of IOHEXOL 350 MG IODINE/ML INTRAVENOUS SOLUTION. 3D reconstructions including MIP and VRT images were reconstructed on a separate 3D workstation using a radiologist approved protocol and were interpreted along with source images. INDICATION:  New onset of diplopia with clinical concern for stroke.  FINDINGS: There is mild luminal irregularity left V4 segment of the vertebral artery with no significant stenosis. A subtle fusiform dilatation is present on series 10 image 29. The intracranial segment of the right vertebral artery is hypoplastic. A right I, PICA trunk is present. Bilateral PICA PICA trunks are incidentally noted. The basilar artery is mildly tortuous with no significant stenosis. The basilar artery is diffusely small in caliber which is likely on the basis of near fetal origin bilateral posterior cerebral arteries. The bilateral parasellar internal carotid, middle cerebral, and anterior cerebral arteries enhance normally. Venous structures are poorly opacified in the arterial phase, with no definite venoocclusive defect identified. Extravascular findings have been discussed separately. 3D reconstructions including MIP and VRT images reconstructed on a separate 3D workstation using a radiologist approved protocol show no evidence of intracranial saccular aneurysm, stenosis, occlusive change, or AV shunting lesion. IMPRESSION: Mild  luminal irregularity of the left vertebral artery potentially due  to atherosclerotic disease, with a subtle fusiform dilatation near foramen magnum. Otherwise normal CTA of the head with no evidence of intracranial stenosis, occlusive change, or AV shunting lesion. Report Verified by: Veto Kemps, M.D. at 02/19/2017 7:26 PM EDT    Ct Angio Neck W And Or Wo    Result Date: 02/19/2017  EXAM: CTA NECK W AND OR WO CONTRAST . TECHNIQUE: Axial thin section CT angiography of the neck was performed with 100 mL of IOHEXOL 350 MG IODINE/ML INTRAVENOUS SOLUTION. 3D reconstructions including MIP and curved MPR images were reconstructed on a separate 3D workstation using a radiologist approved protocol and were interpreted along with source images. INDICATION:  New onset of diplopia with clinical concern for stroke.  FINDINGS: Standard arch anatomy is evident with no origin stenosis of the innominate, bilateral common carotid, subclavian, or vertebral arteries. The right vertebral artery is markedly hypoplastic. The left vertebral artery is tortuous proximally without significant kinking. Mild calcified plaques involve the aortic arch with no dilation or dissection of the aortic arch included. The the included pulmonary arteries enhance normally. The bilateral common, internal, and external carotid arteries history normally. The bilateral vertebral arteries enhance normally. Multilevel cervical degenerative disc disease and facet arthrosis is present with no high-grade canal narrowing at any level. Multilevel neural foraminal stenosis is present. There is likely mild to moderate lower cervical canal narrowing. No neck mass or adenopathy is included. 3D reconstructions including MIP and curved MPR images reconstructed on a separate 3D workstation using a radiologist approved protocol show no evidence of dissection or aneurysm. IMPRESSION: Minimal atherosclerotic disease of the aortic arch with no hemodynamically significant carotid or vertebral artery stenosis. No dissection or pseudoaneurysm  is seen. EXAM: CTA HEAD W AND OR WO CONTRAST . TECHNIQUE: Axial thin section CT angiography of the head was performed with 100 mL of IOHEXOL 350 MG IODINE/ML INTRAVENOUS SOLUTION. 3D reconstructions including MIP and VRT images were reconstructed on a separate 3D workstation using a radiologist approved protocol and were interpreted along with source images. INDICATION:  New onset of diplopia with clinical concern for stroke.  FINDINGS: There is mild luminal irregularity left V4 segment of the vertebral artery with no significant stenosis. A subtle fusiform dilatation is present on series 10 image 29. The intracranial segment of the right vertebral artery is hypoplastic. A right I, PICA trunk is present. Bilateral PICA PICA trunks are incidentally noted. The basilar artery is mildly tortuous with no significant stenosis. The basilar artery is diffusely small in caliber which is likely on the basis of near fetal origin bilateral posterior cerebral arteries. The bilateral parasellar internal carotid, middle cerebral, and anterior cerebral arteries enhance normally. Venous structures are poorly opacified in the arterial phase, with no definite venoocclusive defect identified. Extravascular findings have been discussed separately. 3D reconstructions including MIP and VRT images reconstructed on a separate 3D workstation using a radiologist approved protocol show no evidence of intracranial saccular aneurysm, stenosis, occlusive change, or AV shunting lesion. IMPRESSION: Mild luminal irregularity of the left vertebral artery potentially due to atherosclerotic disease, with a subtle fusiform dilatation near foramen magnum. Otherwise normal CTA of the head with no evidence of intracranial stenosis, occlusive change, or AV shunting lesion. Report Verified by: Veto Kemps, M.D. at 02/19/2017 7:26 PM EDT    Mri Head W Wo Contrast    Result Date: 02/20/2017  EXAM: MRI HEAD W AND WO CONTRAST . INDICATION: Diplopia with  clinical concern for malignant perineural disease. TECHNIQUE:  MRI HEAD W AND WO CONTRAST performed including multiplanar thin section focused field of view T1 and T2 weighted sequences with specific attention to the internal auditory canals. Imaging was obtained prior to and after IV administration 9 mL of GADOBUTROL 1 MMOL/ML (604.72 MG/ML) INTRAVENOUS SOLUTION. FINDINGS: In comparison to the prior head CT performed 02/19/2017, the ventricles are stable in size and position. Sagittal images show normal appearance of the pituitary gland, optic chiasm, and cerebellar tonsils. Very few scattered punctate white matter signal alterations are most compatible with chronic microvascular ischemic disease. No zones of vasogenic edema or intracranial mass effect are present. There is multilevel cervical degenerative disc disease incompletely included with lower cervical canal narrowing. Zones of chronic cord compression to a mild degree cannot be excluded. No abnormal vascular loops are associated with the internal auditory canals or 5th cranial nerves. Tortuosity of the basilar artery slightly displaces the cisternal segment of right cranial nerve VI, of uncertain relation to clinical symptomatology. No other lesion is seen along the course of cranial nerve VI. The cavernous sinuses are symmetric and show no abnormal enhancement or evidence of lesion. The orbits, paranasal sinuses, skull base and mastoid regions as well as intracranial flow voids are otherwise unremarkable. Following gadolinium administration, no areas of abnormal enhancement are identified. No zones of diffusion restriction are present.     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 Tortuosity of the basilar artery causes mild displacement of the right 6th cranial nerve, of uncertain relation to clinical symptomatology. A variant of vascular compression syndrome cannot be excluded. Report  Verified by: Veto Kemps, M.D. at 02/20/2017 3:30 PM EDT    X-ray Portable Chest    Result Date: 02/19/2017  EXAM: XR PORTABLE CHEST dated 02/19/2017 5:52 PM EDT CLINICAL HISTORY: Difficulty breathing TECHNIQUE: Portable AP view of the chest. COMPARISON: Chest radiograph from 11/25/2016. FINDINGS: Cardiomediastinal contours: Normal Lungs: Surgical suture line projects over the right lower lung zone, and a surgical clip projects over the right hilum. Low lung volumes. Streaky perihilar and bibasilar parenchymal opacities, likely atelectasis. No focal areas of effusion or consolidation. Bones: No acute osseous abnormalities.     IMPRESSION: Streaky perihilar and bibasilar opacities, likely atelectasis. No acute cardiopulmonary process Approved by Shelda Jakes, MD on 02/19/2017 6:15 PM EDT I have personally reviewed the images and I agree with this report. Report Verified by: Alesia Richards, M.D. at 02/19/2017 6:50 PM EDT    Code Stroke-ct Head Wo Contrast    Result Date: 02/19/2017  EXAM: CT HEAD WITHOUT CONTRAST . INDICATION:  New onset of diplopia with clinical concern for stroke. TECHNIQUE: Axial thin section CT images of the head were obtained without contrast. Sagittal and coronal 2-D multiplanar reconstructions were performed at the scanner. FINDINGS: The ventricles are normal in size and position. A tiny focus of low-attenuation due to age-indeterminate potentially remote lacunar infarct is present in the left anterior limb internal capsule on series 2 image 15. No intracranial hemorrhage or mass effect is identified. The orbits and paranasal sinuses demonstrate no significant abnormality. The mastoid air cells are well aerated. Minimal atherosclerotic vascular calcifications are present. CTA source images demonstrate no evidence of large vessel occlusion. Full report will follow when the reconstructions are received.     IMPRESSION: Age-indeterminate potentially remote lacunar infarct left anterior limb  internal capsule. If neurologic deficit correlates, and/or there is further concern for acute infarct, MR with  diffusion could be helpful for further evaluation. Report Verified by: Veto Kemps, M.D. at 02/19/2017 6:33 PM EDT    X-ray Lumbar Spine Puncture Dx    Result Date: 02/22/2017  Fluoroscopic-guided lumbar puncture 02/22/2017 12:04 PM EDT Indication: Cranial Nerve palsy Operators: J. Laurita Quint, M.D.  Procedure: The potential risks and benefits of the procedure were explained to the patient and informed written consent was obtained. The patient was placed in the prone position on the fluoroscopy table. A time out was performed verifying the correct patient, correct procedure and correct site. The skin of the lumbar region was then prepped with DuraPrep  solution and draped. A total of 3mL 1% lidocaine was placed into the subcutaneous and deep soft tissues for local anesthesia. A 9 cm, 22-gauge  Gertie-Marx needle was directed towards the thecal sac under fluoroscopic guidance via an L2-3 interlaminar approach. Once the needle tip was embedded within the posterior ligamentous structures, the patient was turned to the left lateral decubitus position. The needle tip was then inserted into the thecal sac. Fluoroscopy was utilized to document the depth of the needle tip. Opening pressure was then obtained in the lateral decubitus position. Opening pressure measured 14.5 cm CSF. A total of 20 mL of clear cerebrospinal fluid was drained. Closing pressure was then obtained in the lateral decubitus position. Closing pressure measured 11 cm CSF. The needle was removed and a Band-Aid was placed over the puncture site. The procedure was well tolerated and there were no immediate complications. Total fluoroscopy time was  0.1 minutes.     IMPRESSION: 1.  Successful fluoroscopic-guided lumbar puncture. Approximately 20 mL of clear cerebrospinal fluid was drained. 2.  Opening pressure of 14.5 cm CSF. Report  Verified by: J. Laurita Quint, M.D. at 02/22/2017 12:55 PM EDT    Fluoro Spine-paraspine Fl Guide Ndl Inj    Result Date: 02/22/2017  Fluoroscopic-guided lumbar puncture 02/22/2017 12:04 PM EDT Indication: Cranial Nerve palsy Operators: J. Laurita Quint, M.D.  Procedure: The potential risks and benefits of the procedure were explained to the patient and informed written consent was obtained. The patient was placed in the prone position on the fluoroscopy table. A time out was performed verifying the correct patient, correct procedure and correct site. The skin of the lumbar region was then prepped with DuraPrep  solution and draped. A total of 3mL 1% lidocaine was placed into the subcutaneous and deep soft tissues for local anesthesia. A 9 cm, 22-gauge  Gertie-Marx needle was directed towards the thecal sac under fluoroscopic guidance via an L2-3 interlaminar approach. Once the needle tip was embedded within the posterior ligamentous structures, the patient was turned to the left lateral decubitus position. The needle tip was then inserted into the thecal sac. Fluoroscopy was utilized to document the depth of the needle tip. Opening pressure was then obtained in the lateral decubitus position. Opening pressure measured 14.5 cm CSF. A total of 20 mL of clear cerebrospinal fluid was drained. Closing pressure was then obtained in the lateral decubitus position. Closing pressure measured 11 cm CSF. The needle was removed and a Band-Aid was placed over the puncture site. The procedure was well tolerated and there were no immediate complications. Total fluoroscopy time was  0.1 minutes.     IMPRESSION: 1.  Successful fluoroscopic-guided lumbar puncture. Approximately 20 mL of clear cerebrospinal fluid was drained. 2.  Opening pressure of 14.5 cm CSF. Report Verified by: J. Laurita Quint, M.D. at 02/22/2017 12:55 PM EDT  Procedures: LP on 10.15.18   Last BMP Result:      Lab 02/20/17  0725   SODIUM 138    POTASSIUM 4.3   CHLORIDE 105   CO2 25   BUN 22   CREATININE 1.19   CALCIUM 9.4     Last CBC Result:      Lab 02/19/17  1808   WBC 11.8*   HEMATOCRIT 44.3   PLATELETS 204   RBC 4.94     No results for input(s): PROTIME, INR in the last 72 hours.      Summary of HPI:    Joanna Lopez is a 66 y.o. female with past medical history of basal cell carcinoma of lung status post right thoracoscopic middle lobectomy, Hypertension, Hyperlipidemia and OSA who presented with diplopia specially when she looks at the left side. The double Vision is better at right side Vision. No headache but has been having mild nausea. No weakness in arm or legs.    Hospital Course:  Given the concern of the his presentation and the concern for the possible multi-factorial etiology contributing to patients symptomatology.CARALEE MOREA   was admitted On 02/19/2017 , evaluated and treated for diplopia ?acute cva, Neurology evaluated her MRI negative for acute stroke,MRA with ?compression of sixth nerve right. Had LP by IR on 10.15.18  , basic studies normal ,studies-, IgG index, o bands, cytology, cytom and paraneoplastic panel pending and will need follow up as outpatient in neurology clinic. Echo with ?thrombus but rpt echo no thrombus noted, cardiology input appreciated, recommended follow up and 30 day event monitor post discharge .her diplopia improved, The patient was determined to be suitable for discharge and the patient felt comfortable with that decision.    today she will be discharged to  Home  in stable condition , recommended to follow up with PCP, neurology cardiology  neuroophthalmologist as an out pt         Discharge Exam:  Vitals:    02/23/17 2020 02/23/17 2323 02/24/17 0442 02/24/17 0757   BP: 149/74 132/73 143/74 (!) 136/92   BP Location: Right arm Right arm Right arm Right arm   Patient Position: Lying Lying Lying Lying   Pulse: 66 64 57 63   Resp: 17 16 16 16    Temp: 98.4 ??F (36.9 ??C) 98.4 ??F (36.9 ??C)  97.8 ??F (36.6 ??C) 97.7 ??F (36.5 ??C)   TempSrc: Oral Oral Oral Oral   SpO2: 99% 97% 97% 97%   Weight:   (!) 222 lb 4 oz (100.8 kg)    Height:           General appearance : comfortable, left eye covered with patch   L:CTA, no W/R/R, not labored  H:RRR, S1S2  ZOX:WRUE ND/NT, normal BS  Ext: no edema      Disposition: home    Patient Instructions:      Medication List      TAKE these medications, which are NEW      Quantity/Refills   aspirin 325 MG tablet  Take 1 tablet (325 mg total) by mouth daily.   Quantity:  30 tablet  Refills:  0        TAKE these medication, which have CHANGED      Quantity/Refills   atorvastatin 80 MG tablet  Commonly known as:  LIPITOR  Take 1 tablet (80 mg total) by mouth at bedtime.  What changed:  ?? medication strength  ?? how much to take  ?? how  to take this  ?? when to take this   Quantity:  30 tablet  Refills:  0     lipase-protease-amylase 24,000-76,000 -120,000 unit Cpdr  Commonly known as:  CREON  Take 2 capsules po with each meal.  What changed:  additional instructions   Quantity:  180 capsule  For:  exocrine pancreatic insufficiency  Refills:  6        TAKE these medications, which you were ALREADY TAKING      Quantity/Refills   acetaminophen 325 MG tablet  Commonly known as:  TYLENOL  Take 3 tablets (975 mg total) by mouth every 6 hours as needed.   Quantity:  60 tablet  Refills:  0     dicyclomine 20 mg tablet  Commonly known as:  BENTYL  Take 20 mg by mouth 4 times a day with meals and at bedtime.   Refills:  0     hydroCHLOROthiazide 25 MG tablet  Commonly known as:  HYDRODIURIL   Refills:  0     lisinopril 20 MG tablet  Commonly known as:  PRINIVIL,ZESTRIL  Take 10 mg by mouth daily. 10 mg (half tablet) daily   Refills:  0     melatonin 3 mg Tab  Take 3 mg by mouth at bedtime. PRN for sleep   Refills:  0     multivitamin tablet  Generic drug:  multivitamin  Take 1 tablet by mouth daily.   Refills:  0     oxymetazoline 0.05 % nasal spray  Commonly known as:  AFRIN  Use 2 sprays  into each nostril 2 times a day. Nightly for sleep   Refills:  0     PARoxetine 10 MG tablet  Commonly known as:  PAXIL  Take 10 mg by mouth every morning.   Refills:  0     roPINIRole 0.25 MG tablet  Commonly known as:  REQUIP  Take 1 tablet (0.25 mg total) by mouth daily with dinner. Take one tablet (0.25 mg) 1-2 hours before bedtime, for three days, then take 2 tablets (0.5 mg) 1-2 hours before bedtime for four days, then take 4 tablets (1 mg) 1-2 hours before bedtime thereafter   Quantity:  95 tablet  For:  Restless Legs Syndrome  Refills:  5           Where to Get Your Medications      You can get these medications from any pharmacy    Bring a paper prescription for each of these medications  ?? aspirin 325 MG tablet  ?? atorvastatin 80 MG tablet        Activity: activity as tolerated  Diet: cardiac diet    Follow-up with [name], MD in 3-5 days.    Total time spent on this discharge was 35 minutes including time spent with the patient, reviewing data, completing medical records, and communication with the patient and multidisciplinary team   Signed:  Rabon Scholle [facility]  02/24/2017  9:26 AM

## 2017-02-24 NOTE — Other (Signed)
[place]    RN Case Manager Discharge Summary     Patient name: Joanna Lopez                                        Patient MRN: [id number]  DOB: January 11, 1951                              Age: 66 y.o.              Gender: female  Patient emergency contact: Extended Emergency Contact Information  Primary Emergency Contact: Shiner,Chuck  Address: [address]           [place], [place] [address] [place]  Home Phone: [phone]  Mobile Phone: [phone]  Relation: Spouse  Preferred language: English  Interpreter needed? No  Secondary Emergency Contact: Straw,Alace   [place]  Home Phone: [phone]  Relation: Daughter      Attending provider: Maretta Bees, MD  Primary care physician: [name], MD    The MD has indicated that the patient is ready for discharge.  [name] is returning home.   Transfer Mode/Level of Care: Family    Patient/Family Informed of Discharge Plan: Yes    Plan Reviewed With Patient, Family, or Significant Other: Yes    Patient and or family are aware and in agreement with the discharge plan: Yes             Plan reviewed with MD and other members of the health care team: Yes  Care Plan Completed: Yes  No further CM needs from review of chart AVS.    This plan has been reviewed with the multi-disciplinary team.       Concha Pyo RN, BSN, CCM  Case Manager  [phone] Ascom  [phone] Fax

## 2017-02-24 NOTE — Unmapped (Signed)
Discharge instructions given, questions answered, SL removed, prescriptions handed to patient, ready for ride home with family. Escorted out to vehicle by this nurse via wheelchair. TS

## 2017-02-25 NOTE — Unmapped (Signed)
Per Dr. Salvatore Decent I called the patient and LM on VM stating that I was wanting to schedule an appt with her. Advised to give the office a call back when she gets a chance. If she calls back please assist the patient in getting scheduled or my desk line is [phone]. Thanks    First Avaliable for Dr. Salvatore Decent is Friday 03/26/17.  Also per Dr. Salvatore Decent the patient should see someone in the stroke clinic as well. The patient could see anyone. I did see that Dr. Lucretia Field did have some availability on Wednesday 03/24/17 at 1:30.

## 2017-02-26 NOTE — Unmapped (Signed)
Second attempt to reach the patient. Per Dr Salvatore Decent the patient can just be scheduled with Dr. Lucretia Field. He has agreed to take on the care of the patient. His next available NPV appt time is Wednesday 03/24/17 at 1:30pm. Please assist the patient in getting scheduled. Thank you !

## 2017-02-26 NOTE — Unmapped (Signed)
-----   Message from Marta Antu, MD sent at 02/25/2017  4:44 PM EDT -----  Regarding: RE: follow up  [facility]!! Actually turns out Dr. Ladell Pier was willing to follow up with her for everything so I don't think she needs two visits.  When she calls back please help her get scheduled with Dr. Lucretia Field.     Much appreciated-    ----- Message -----  From: Larose Hires, MA  Sent: 02/25/2017  11:50 AM  To: Marta Antu, MD  Subject: RE: follow up                                    Called the patient and LM on VM to return a call when she gets a chance to schedule with our office as a NPV and someone in the stroke clinic.   ----- Message -----  From: Marta Antu, MD  Sent: 02/22/2017   9:49 PM  To: Larose Hires, MA  Subject: follow up                                        Hi Katelin!    Could we get this patient scheduled as new patient visit with either me, Dr. Roswell Nickel or Dr. Harriette Bouillon... Whoever has earliest available. Thank you!    Irving Burton

## 2017-02-26 NOTE — Unmapped (Signed)
Pt would like to be scheduled with Dr. Lucretia Field for NPV.

## 2017-03-05 NOTE — Unmapped (Signed)
Patient was incorrectly scheduled in Vascular Clinic, contacted patient and rescheduled patient to Neurology clinic for stroke follow up

## 2017-03-10 ENCOUNTER — Ambulatory Visit: Admit: 2017-03-10 | Payer: MEDICARE | Attending: Neurology

## 2017-03-10 DIAGNOSIS — H532 Diplopia: Secondary | ICD-10-CM

## 2017-03-10 NOTE — Unmapped (Signed)
PCP: [name], MD    Dear Colleagues,     We had the pleasure of seeing [name] in the [place] [facility] in consultation.  As you know the patient is a 66 y.o. year old female with  has a past medical history of Basal cell cancer; GERD (gastroesophageal reflux disease); Hyperlipidemia; Hypertension; IBS (irritable bowel syndrome); and OSA (obstructive sleep apnea). who presents with New Patient Visit/ Consultation  .      Inpt Neurology note (Dr Salvatore Decent):     Joanna Lopezis a 66 y.o. female??with past medical history of facial basal cell cancer, HTN,HLD, OSA, IBS, recently diagnosed lung cancer s/p R middle lobectomy who presents with double vision and mild headache, more mild and different than typical migraines. On initial exam findings are subtle- she notes double vision worse with looking left, resolved with rightward gaze, ?subtle inability of left eye to fully abduct. No other findings thought ED documented ataxia on left. MRI without stroke but MRA with ?vascular compression of right sixth. This does not fit with exam. MRI negative stroke, ischemic vs increased pressure vs other such as paraneoplastic (recently diagnosed with lung cancer) vs less likely CTA negative aneurysm- headache very mild. ?funcitonal contribution given subtle deficits and somewhat variable description of double vision during exam with significant life stressors of late. The fact that ED documented left sided ataxia, now resolved, suggests TIA.     Lumbar puncture w/ basic studies normal: remaining studies-, IgG index, o bands, cytology, cytom and paraneoplastic panel pending and will need follow up as outpatient in neurology clinic, additional CSF held for further studies, Recommend follow up with neuro-ophthalmology for full exam. Referral provided,.  Follow up with neurology clinic as well. Discussed with Dr. Ladell Pier    Today's history (03/10/2017):   Slow growing lung CA s/p  right middle lung lobectomy - no radiation / chemo    Developed sudden onset double vision and metallic taste in mouth.      Has had persistent metallic taste in mouth    ?right leg ataxia on heel/knee / shin test in ED      PMHx:   Past Medical History:   Diagnosis Date   ??? Basal cell cancer    ??? GERD (gastroesophageal reflux disease)    ??? Hyperlipidemia    ??? Hypertension    ??? IBS (irritable bowel syndrome)    ??? OSA (obstructive sleep apnea)        PSHx:   Past Surgical History:   Procedure Laterality Date   ??? APPENDECTOMY     ??? COLONOSCOPY N/A 01/23/2013    Procedure: COLONOSCOPY WITH MAC;  Surgeon: Lurene Shadow, MD;  Location: [facility] ENDOSCOPY;  Service: Gastroenterology;  Laterality: N/A;   ??? FLEXIBLE BRONCHOSCOPY W/ UPPER ENDOSCOPY N/A 09/17/2016    Procedure: BRONCHOSCOPY SUPER D WITH FLUORO AND BIOPSY-EBUS/NAVI;  Surgeon: Johnny Bridge, MD;  Location: [facility] ENDOSCOPY;  Service: Pulmonary;  Laterality: N/A;   ??? submastoid gland removal     ??? THORACOSCOPY Right 11/23/2016    Procedure: RIGHT SIDED VATS MIDDLE LOBE LOBECTOMY,HILAR MEDIASTINAL LYMPH NODE DISSECTION;  Surgeon: Janett Billow, MD;  Location: [facility] OR;  Service: Thoracic;  Laterality: Right;   ??? TONSILLECTOMY     ??? UVULOPALATOPHARYNGOPLASTY          Meds:   Outpatient Encounter Prescriptions as of 03/10/2017   Medication Sig Dispense Refill   ??? acetaminophen (TYLENOL) 325 MG tablet Take 3 tablets (975 mg  total) by mouth every 6 hours as needed. 60 tablet 0   ??? aspirin 325 MG tablet Take 1 tablet (325 mg total) by mouth daily. 30 tablet 0   ??? atorvastatin (LIPITOR) 80 MG tablet Take 1 tablet (80 mg total) by mouth at bedtime. 30 tablet 0   ??? dicyclomine (BENTYL) 20 mg tablet Take 20 mg by mouth 4 times a day with meals and at bedtime.     ??? lipase-protease-amylase (CREON) 24,000-76,000 -120,000 unit CpDR Take 2 capsules po with each meal. (Patient taking differently: Take 2 capsules po with each meal.  Taking PRN Indications: exocrine pancreatic  insufficiency         ) 180 capsule 6   ??? lisinopril (PRINIVIL,ZESTRIL) 20 MG tablet Take 10 mg by mouth daily. 10 mg (half tablet) daily     ??? melatonin 3 mg Tab Take 3 mg by mouth at bedtime. PRN for sleep     ??? multivitamin (MULTIVITAMIN) tablet Take 1 tablet by mouth daily.     ??? PARoxetine (PAXIL) 10 MG tablet Take 10 mg by mouth every morning.     ??? roPINIRole (REQUIP) 0.25 MG tablet Take 1 tablet (0.25 mg total) by mouth daily with dinner. Take one tablet (0.25 mg) 1-2 hours before bedtime, for three days, then take 2 tablets (0.5 mg) 1-2 hours before bedtime for four days, then take 4 tablets (1 mg) 1-2 hours before bedtime thereafter 95 tablet 5   ??? [DISCONTINUED] hydroCHLOROthiazide (HYDRODIURIL) 25 MG tablet      ??? [DISCONTINUED] oxymetazoline (AFRIN) 0.05 % nasal spray Use 2 sprays into each nostril 2 times a day. Nightly for sleep       No facility-administered encounter medications on file as of 03/10/2017.      Allergies: Formaldehyde analogues; Penicillins; Sulfa (sulfonamide antibiotics); and Latex  Social Hx:   Social History   Substance Use Topics   ??? Smoking status: Never Smoker   ??? Smokeless tobacco: Never Used   ??? Alcohol use Yes      Comment: 2-3 wine/week     FHx: family history includes Diabetes in her son. She was adopted.    ROS  System Denies /  Endorses    Constitutional fever, night sweats, weight changes, chills, fatigue   Skin rash, itching   Eyes vision loss, double vision, blurred vision   Heart chest pain, rapid heart beat   Lungs shortness of breath, wheezing   Gastrointestinal abdominal pain, vomiting, diarrhea, constipation, blood in stool   Genitourinary, kidney kidney stones, sexual dysfunction, blood in urine   Endocrine diabetes, thyroid disease   Hematologic/lymph anemia, bleeding, clotting problems, enlarged nodes   Musculoskeletal pain, weakness, loss of muscle   Psychiatric depression, anxiety   Neurological seizure, syncope, memory loss, headache     Answers for HPI/ROS  submitted by the patient on 03/10/2017   Activity Change: No  Appetite change: Yes  Chills: No  Diaphoresis (Excessive Sweating): No  Fatigue: Yes  Fever: No  Unexpected Weight Change: No  Facial Swelling: No  Neck Pain: Yes  Neck Stiffness: Yes  Ear Discharge: No  Hearing Loss: No  Ear Pain: No  Tinnitus (ringing in ears): No  Nosebleeds: No  Congestion: No  Rhinorrhea (Excessive discharge from the nose): No  Postnasal Drip: Yes  Sneezing: No  Sinus Pressure: No  Dental Problems: No  Drooling: No  Mouth Sores: No  Sore Throat: No  Trouble Swallowing: No  Voice Change: No  Eye Discharge:  No  Eye Itching: No  Eye Pain: Yes  Eye Redness: No  Photophobia (Sensitivity to Light): No  Visual Disturbance: Yes  Apnea: No  Chest Tightness: No  Choking: No  Cough: No  Shortness of Breath: No  Stridor (High pitched wheezing): No  Wheezing: No      VITAL SIGNS  Ht 5' 7 (1.702 m)    Wt 208 lb (94.3 kg)    BMI 32.58 kg/m??     GENERAL EXAM:  General: no acute distress  HEENT: mucous membranes moist  Heart: regular rate and rhythm, no murmurs   Lungs: easy respirations, clear to auscultation  Abdomen: soft  Extremities: no edema, good pulses    NEURO EXAM  Mental Status  Language: no aphasia  Atten/concentration: awake and alert   Orientation: oriented x3   Fund of Knowledge: grossly intact  Memory: Recent and remote memory intact    Cranial Nerves:  Pupils: equal, round, reactive to light and accomodation   Visual Fields: full to Teacher, early years/pre Acuity:  Grossly intact OU   CN 3,4,6 (EOM): persistent right CN 4 palsy.   Cranial Nerve 5 (Trigeminal): bilateral facial sensation normal   Cranial Nerve 7 (Facial): normal facial symmetry and strength   CN 8 (Auditory): hearing intact to finger rub bilaterally  CN 7,9/10,12: No dysarthria  Cranial Nerve 12 (Hypoglossal): tongue is midline on protrusion    Motor Exam:  Bulk and tone normal, There is no Pronator Drift, Fine finger movements and foot taps are brisk bilat    Sensation: Intact to light touch in arms and legs.    Coordination: Finger to nose  without ataxia; Negative Romberg.  Gait: Normal, narrow-based gait, Able to walk on heels/toes, slight difficulty w tandem.     STUDIES REVIEWED:     LABS:  Lab Results   Component Value Date    HGBA1C 5.5 02/19/2017     Lab Results   Component Value Date    CHOLTOT 174 02/22/2017    TRIG 249 (H) 02/22/2017    HDL 41 (L) 02/22/2017    LDL 83 02/22/2017     No results found for: TSH, T3TOTAL, T4TOTAL, THYROIDAB      IMAGING:   CTA Head/Neck (02/19/17):   ??  Minimal atherosclerotic disease of the aortic arch with no hemodynamically significant carotid or vertebral artery stenosis. No dissection or pseudoaneurysm is seen.    Mild luminal irregularity of the left vertebral artery potentially due to atherosclerotic disease, with a subtle fusiform dilatation near foramen magnum. Otherwise normal CTA of the head with no evidence of intracranial stenosis, occlusive change, or AV shunting lesion.     MRI (02/20/17):   ??  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  ??  Tortuosity of the basilar artery causes mild displacement of the right 6th cranial nerve, of uncertain relation to clinical symptomatology. A variant of vascular compression syndrome cannot be excluded.     ECHO 02/22/2017  Study Conclusions    - Left ventricle: The cavity size was normal. Wall thickness was  ????normal. Systolic function was normal. The estimated ejection  ????fraction was in the range of 60% to 65%. Wall motion was normal;  ????there were no regional wall motion abnormalities.There was a  ????small, well circumscribed echodensity at the left ventricular  ????apex, of uncertain etiology. There are no accompanying wall motion  ????abnormalities to suggest an apical thrombus. Doppler parameters  ????are consistent with abnormal left  ventricular relaxation (grade 1  ????diastolic dysfunction).  - Mitral valve: Mild  regurgitation.  - Right ventricle: Systolic function was normal.  - Atrial septum: No defect or patent foramen ovale was identified.  - Pulmonic valve: Peak gradient (S): 4mm Hg.    Repeat TTE With Definity Contrast - Impressions: ??No evidence of apical thrombus on definity echo    ASSESSMENT/PLAN:  In summary, Joanna Lopez is a 66 y.o. female with a history of well controlled hypertension, hyperlipidemia and a slow-growing lung lesion that has been resected who presents for evaluation in stroke clinic after recent hospitalization for acute onset diplopia and metallic taste which persist.  Of note, she has had a recent episode of cardiac palpitations and cardiac pause that woke her out of sleep.  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 Brain MRI was read as negative for stroke (?tiny DWI+ lesion within midbrain along periaqueductal grey, near but not in the classic location for 4th nerve nucleus)  but she was found to have a subtle fusiform dilatation of the intradural left vertebral artery.  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  We would recommend / have instituted the following plans.     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   - cardiac event monitor while she awaits loop recorder (although this is most likely small vessel event  - continue to follow with [facility]  - continue aspirin / statin    # cardiac palpitations / pause  - referral to EP  - cardiac event monitor while she awaits loop recorder (although this is most likely small vessel event    Followup:     After she gets the results from the  event monitor, we would like Joanna Lopez to return to see Dr. Ladell Pier (2-75mo) in the [facility].  Please do not hesitate to contact us with any questions.      Sincerely,   Ladell Pier MD, PhD  Attending, Vascular Neurology  Neuro-interventional Surgery  [facility]    Greater than half of this 50 minute visit was spent counseling the patient about treatment options, stroke risk factors and warning signs, stroke secondary prevention, and developing a plan of care in the event that stroke-like symptoms should occur.

## 2017-03-10 NOTE — Unmapped (Signed)
In summary, Joanna Lopez is a 66 y.o. female with a history of well controlled hypertension, hyperlipidemia and a slow-growing lung lesion that has been resected who presents for evaluation in stroke clinic after recent hospitalization for acute onset diplopia and metallic taste which persist.  Of note, she has had a recent episode of cardiac palpitations and cardiac pause that woke her out of sleep.  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 Brain MRI was read as negative for stroke (?tiny DWI+ lesion within midbrain along periaqueductal grey, near but not in the classic location for 4th nerve nucleus)  but she was found to have a subtle fusiform dilatation of the intradural left vertebral artery.  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  We would recommend / have instituted the following plans.     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   - cardiac event monitor while she awaits loop recorder (although this is most likely small vessel event  - continue to follow with [facility]  - continue aspirin / statin    # cardiac palpitations / pause  - referral to EP  - cardiac event monitor while she awaits loop recorder (although this is most likely small vessel event    Followup:     After she gets the results from the event monitor, we would like Joanna Lopez to return to see Dr. Ladell Pier (2-39mo) in the [facility].  Please do not hesitate to contact us with any questions.

## 2017-03-12 ENCOUNTER — Ambulatory Visit: Payer: MEDICARE | Attending: Neurology

## 2017-03-22 ENCOUNTER — Ambulatory Visit: Admit: 2017-03-22 | Payer: MEDICARE | Attending: Cardiovascular Disease

## 2017-03-22 DIAGNOSIS — R002 Palpitations: Secondary | ICD-10-CM

## 2017-03-22 NOTE — Unmapped (Signed)
Subjective:       Patient ID: Joanna Lopez is a 66 y.o. female.    Chief Complaint: Here to discuss long term cardiac rhythm monitoring.    HPI   This is a 66 y.o. female??with HTN, OSA, lung Ca s/p R middle lobectomy, recent hospital admission 10/12 - 02/24/17 for diplopia and noted to have ataxia while in the ER which resolved. She had extensive work up done including MRI brain, lumbar puncture, TTE. There was concern for small apical LV thrombus on initial echocardiogram, however, on repeat echo with contrast there was no evidence of LV thrombus. She is referred to me to discuss long term cardiac rhythm monitoring to assess for AF as potential cause for TIA. She has no prior h/o AF. She had 2 episodes of palpitations lasting few min over the last month. These happened during night and resolved quickly. No dizziness or syncope.       Histories:   She has a past medical history of Basal cell cancer; GERD (gastroesophageal reflux disease); Hyperlipidemia; Hypertension; IBS (irritable bowel syndrome); and OSA (obstructive sleep apnea).    She has a past surgical history that includes Appendectomy; submastoid gland removal; Uvulopalatopharyngoplasty; Tonsillectomy; Colonoscopy (N/A, 01/23/2013); Flexible bronchoscopy w/ upper endoscopy (N/A, 09/17/2016); and Thoracoscopy (Right, 11/23/2016).    Her family history includes Diabetes in her son. She was adopted.    She reports that she has never smoked. She has never used smokeless tobacco. She reports that she drinks alcohol. She reports that she does not use drugs.    ROS:   ROS  General: Denies fever, or weight loss.  HEENT: No changes in hearing or vision.  Cardiovascular: As per HPI  Respiratory: Negative for sputum production, wheezing.  GI: Negative for nausea, vomiting, diarrhea.  GU: Denies dysuria, hematuria.  Musculoskeletal: Denies joint swelling   Skin: Denies easy bruising, denies rashes or lesions   Psych: Denies episodes of anxiety, denies  depression   Hematology/Lymphology: Negative for prolonged bleeding, bruising, or swollen nodes.  Endocrine: Negative for history of thyroid disease.  Neurologic: Negative for loss of consciousness.    Allergies:   Formaldehyde analogues; Penicillins; Sulfa (sulfonamide antibiotics); and Latex    Medications:     Outpatient Encounter Prescriptions as of 03/22/2017   Medication Sig Dispense Refill   ??? acetaminophen (TYLENOL) 325 MG tablet Take 3 tablets (975 mg total) by mouth every 6 hours as needed. 60 tablet 0   ??? aspirin 325 MG tablet Take 1 tablet (325 mg total) by mouth daily. 30 tablet 0   ??? atorvastatin (LIPITOR) 80 MG tablet Take 1 tablet (80 mg total) by mouth at bedtime. 30 tablet 0   ??? dicyclomine (BENTYL) 20 mg tablet Take 20 mg by mouth 4 times a day with meals and at bedtime.     ??? lipase-protease-amylase (CREON) 24,000-76,000 -120,000 unit CpDR Take 2 capsules po with each meal. (Patient taking differently: Take 2 capsules po with each meal.  Taking PRN Indications: exocrine pancreatic insufficiency         ) 180 capsule 6   ??? lisinopril (PRINIVIL,ZESTRIL) 20 MG tablet Take 10 mg by mouth daily. 10 mg (half tablet) daily     ??? melatonin 3 mg Tab Take 3 mg by mouth at bedtime. PRN for sleep     ??? multivitamin (MULTIVITAMIN) tablet Take 1 tablet by mouth daily.     ??? PARoxetine (PAXIL) 10 MG tablet Take 10 mg by mouth every morning.     ???  roPINIRole (REQUIP) 0.25 MG tablet Take 1 tablet (0.25 mg total) by mouth daily with dinner. Take one tablet (0.25 mg) 1-2 hours before bedtime, for three days, then take 2 tablets (0.5 mg) 1-2 hours before bedtime for four days, then take 4 tablets (1 mg) 1-2 hours before bedtime thereafter 95 tablet 5     No facility-administered encounter medications on file as of 03/22/2017.         Objective:       Blood pressure 124/80, pulse 79, resp. rate 18, height 5' 7 (1.702 m), weight 204 lb 11.2 oz (92.9 kg), SpO2 95 %.    Physical Exam  General: Well-appearing, in no  acute distress.  HEENT: Normocephalic, atraumatic.   Neck: Supple. No jugular venous distention.  Respiratory: Clear to auscultation.   Cardiovascular: Normal S1 and S2. Regular.   Gastrointestinal: Soft, nondistended.   Extremities: Without clubbing, cyanosis, or edema.   Neurologic: Alert and oriented x3. Speech is clear and fluent.   Skin:  Warm to touch, no rashes, lumps, or lesions.  MS: No joint swelling in all extremities.    Lab Review:     CBC:    Lab Results   Component Value Date    HGB 15.0 02/19/2017    HGB 13.2 11/23/2016    HCT 44.3 02/19/2017    HCT 40.5 11/23/2016    PLT 204 02/19/2017    WBC 11.8 (H) 02/19/2017    MCV 89.8 02/19/2017    MCH 30.5 02/19/2017       RENAL:    Lab Results   Component Value Date    NA 138 02/20/2017    K 4.3 02/20/2017    CL 105 02/20/2017    CO2 25 02/20/2017    BUN 22 02/20/2017    CREATININE 1.19 02/20/2017    GLUCOSE 102 (H) 02/20/2017    PHOS 3.5 11/26/2016    ALBUMIN 3.4 (L) 02/22/2017    CALCIUM 9.4 02/20/2017       LIVER:    Lab Results   Component Value Date    AST 11 (L) 12/29/2016    ALT 14 12/29/2016    BILITOT 0.8 12/29/2016    ALBUMIN 3.4 (L) 02/22/2017    ALKPHOS 69 12/29/2016       LIPIDS:    Lab Results   Component Value Date    CHOLTOT 174 02/22/2017    LDL 83 02/22/2017    HDL 41 (L) 02/22/2017    TRIG 249 (H) 02/22/2017         Review of Test Results:     Echocardiogram    Recent Results (from the past 8760 hours)   ECHOCARDIOGRAM COMPLETE WITH BUBBLE STUDY    Narrative                            * [facility]                       Department of Cardiology*                         [address]                         [place], [place] [address]  [phone]    Transthoracic Echocardiography    (Report amended )    Patient:    Alnisa, Hasley  MR #:       [id number]  Account:  Study Date: 02/22/2017  Gender:     F  Age:        65  DOB:        03-18-1951  Room:       [facility]     CONSULTING  Kleindorfer, Dawn    ADMITTING   Katneni, Ranjit   PERFORMING  [facility], Mca   ATTENDING   Tamiri, Hema Kumari   ORDERING    [place], Emily Rachael   REFERRING   D[place]and, Emily Rachael   REFERRING   Nurre   OPERATOR    [place], Melissa    --------------------------------------------------------------------    Procedure:ECHO 2D         Order:ECHO 2D         Accession  COMPLETE W/BUBBLE STUDY   COMPLETE W/BUBBLE     Number:[id number]                            STUDY    --------------------------------------------------------------------  Indications:      G45.9 TIA.    --------------------------------------------------------------------  PMH:  Hyperlipidemia, GERD, OSA.  Risk factors:  Hypertension.     --------------------------------------------------------------------  Study data:   Study status:  Routine.  Procedure:  Transthoracic  echocardiography. Image quality was good. Scanning was performed  from the parasternal, apical, and subcostal acoustic windows.  Intravenous contrast (agitated saline) was administered.  Transthoracic echocardiography.  M-mode, complete 2D, complete  spectral Doppler, and color Doppler.  Birthdate:  Patient birthdate:  01-30-1951.  Age:  Patient is 66yr old.  Sex:  Gender: female.  Height:  Height: 67in. Height: 67in.  Weight:  Weight: 224.5lb.  Weight: 224.6lb.  Body mass index:  BMI: 35.2kg/m^2.  Body surface  area:    BSA: 2.30m^2.  Blood pressure:     150/88  Patient status:  Inpatient.  Study date:  Study date: 02/22/2017. Study time: 10:30  AM.  Location:  Echo laboratory.    --------------------------------------------------------------------  Study Conclusions    - Left ventricle: The cavity size was normal. Wall thickness was    normal. Systolic function was normal. The estimated ejection    fraction was in the range of 60% to 65%. Wall motion was normal;    there were no regional wall motion abnormalities.There was a    small, well circumscribed echodensity at the left  ventricular    apex, of uncertain etiology. There are no accompanying wall motion    abnormalities to suggest an apical thrombus. Doppler parameters    are consistent with abnormal left ventricular relaxation (grade 1    diastolic dysfunction).  - Mitral valve: Mild regurgitation.  - Right ventricle: Systolic function was normal.  - Atrial septum: No defect or patent foramen ovale was identified.  - Pulmonic valve: Peak gradient (S): 4mm Hg.    Recommendations:  This procedure has been discussed with the  referring physician, Dr. Bufford Spikes.    --------------------------------------------------------------------  Cardiac Anatomy    Left ventricle:    - The cavity size was normal. Wall thickness was normal. Systolic    function was normal. The estimated ejection fraction was in the    range of 60% to 65%. Wall motion was normal; there were no    regional wall motion abnormalities.There was a  small, well    circumscribed echodensity at the left ventricular apex, of    uncertain etiology. There are no accompanying wall motion    abnormalities to suggest an apical thrombus.    - Doppler parameters are consistent with abnormal left ventricular    relaxation (grade 1 diastolic dysfunction).    Aortic valve:    - Trileaflet; normal thickness leaflets. Mobility was not    restricted.    Doppler:    - Transvalvular velocity was within the normal range. There was no    stenosis. No regurgitation.    - Peak velocity ratio of LVOT to aortic valve: 0.73.    - Mean gradient (S): 4mm Hg. Peak gradient (S): 7mm Hg.    Aorta:   Aortic root:    - The aortic root was normal in size.    Mitral valve:    - Structurally normal valve. Mobility was not restricted.    Doppler:    - Transvalvular velocity was within the normal range. There was no    evidence for stenosis. Mild regurgitation.    - Mean gradient (D): 1mm Hg. Peak gradient (D): 4mm Hg.    Left atrium:    - The atrium was normal in size.    Atrial septum:    - No defect or patent  foramen ovale was identified.    Right ventricle:    - The cavity size was normal. Wall thickness was normal. Systolic    function was normal. Systolic pressure was within the normal    range.    Pulmonic valve:    - Poorly visualized.    Doppler:    - Transvalvular velocity was within the normal range. There was no    evidence for stenosis. No regurgitation.    - Mean gradient (S): 2mm Hg. Peak gradient (S): 4mm Hg.    Tricuspid valve:    - Structurally normal valve.    Doppler:    - Transvalvular velocity was within the normal range. Mild    regurgitation.    Pulmonary artery:    - Systolic pressure was within the normal range.    Right atrium:    - The atrium was normal in size.    Pericardium:    - There was no pericardial effusion.    Systemic veins:  Inferior vena cava:    - The vessel was normal in size. The respirophasic diameter changes    were in the normal range (= 50%), consistent with normal central    venous pressure.    --------------------------------------------------------------------  Measurements     Left ventricle                               Value        Reference   LV ID, ED, PLAX                      (N)     4.0   cm     3.9 - 5.3   LV ID, ES, PLAX                              3.0   cm     ---------   LV fx shortening, PLAX               (L)     24    %  27 - 45   LV mid-wall fx shortening, PLAX      (L)     11    %      15 - 23   LV fx shortening, PLAX chordal       (L)     25    %      27 - 45   LV PW thickness, ED, PLAX            (N)     0.9   cm     0.6 - 0.9   IVS/LV PW ratio, ED, PLAX                    1.06         ---------   LV end-diastolic volume              (N)     62    ml     56 - 104   LV end-systolic volume               (N)     27    ml     19 - 49   LV ejection fraction                 (N)     57    %      >=55   LV end-diastolic volume/bsa          (L)     28    ml/m^2 35 - 75   LV end-systolic volume/bsa           (N)     12    ml/m^2 12 - 30   LV e', lateral                                0.102 m/sec  ---------   LV E/e', lateral                             8            ---------   LV e', medial                                0.076 m/sec  ---------   LV E/e', medial                              11           ---------   LV e', average                               0.089 m/sec  ---------   LV E/e', average                             9            ---------      Ventricular septum                           Value        Reference   IVS thickness,  ED, PLAX              (H)     1.0   cm     0.6 - 0.9      LVOT                                         Value        Reference   LVOT peak velocity, S                        0.98  m/sec  ---------      Aortic valve                                 Value        Reference   Aortic valve peak velocity, S                1.3   m/sec  ---------   Aortic mean gradient, S                      4     mm Hg  ---------   Aortic peak gradient, S                      7     mm Hg  ---------   Velocity ratio, peak, LVOT/AV                0.73         ---------      Aorta                                        Value        Reference   Aortic root ID, ED                   (N)     3.3   cm     <4.3      Left atrium                                  Value        Reference   LA ID, A-P, ES                       (N)     3.3   cm     2.7 - 3.8   LA ID/bsa, A-P                       (N)     1.5   cm/m^2 1.5 - 2.3      Mitral valve                                 Value        Reference   Mitral E-wave peak velocity                  0.82  m/sec  ---------  Mitral A-wave peak velocity                  1.01  m/sec  ---------   Mitral mean velocity, D                      0.52  m/sec  ---------   Mitral deceleration time                     264   ms     ---------   Mitral mean gradient, D                      1     mm Hg  ---------   Mitral peak gradient, D                      4     mm Hg  ---------   Mitral E/A ratio, peak                       0.8           ---------   Mitral A-wave VTI                            29.9  cm     ---------      Tricuspid valve                              Value        Reference   Tricuspid regurg peak velocity               2.6   m/sec  ---------   Tricuspid peak RV-RA gradient                26    mm Hg  ---------   Tricuspid maximal regurg velocity,           2.51  m/sec  ---------   PISA      Right ventricle                              Value        Reference   RV ID, ED, PLAX                              3.8   cm     ---------      Pulmonic valve                               Value        Reference   Pulmonic valve peak velocity, S              1     m/sec  ---------   Pulmonic valve mean velocity, S              0.68  m/sec  ---------   Pulmonic valve VTI, S                        267.0 cm     ---------   Pulmonic mean gradient, S  2     mm Hg  ---------   Pulmonic peak gradient, S                    4     mm Hg  ---------    Legend:  (L)  and  (H)  mark values outside specified reference range.    (N)  marks values inside specified reference range.    Danella Maiers, MD  [id number]:52:08     Signed by: Eda Keys, MD        Contrast echo (02/23/17):  Impressions: ??No evidence of apical thrombus on definity echo    ECG (02/19/17): normal sinus rhythm at 79 bpm (personally reviewed).              Assessment:   TIA.  Palpitations.    Plan:   Discussed role of long term cardiac rhythm monitoring to assess for AF in cryptogenic stroke. She also has intermittent palpitations.  Reasonable for long term cardiac rhythm monitoring with ILR implant to assess for AF as potential cause for stroke.  Discussed procedural details of ILR implant including small risk of infection/ bleeding. Discussed remote monitoring.  I will schedule her for ILR implant in the near future.

## 2017-03-22 NOTE — Unmapped (Signed)
Follow up after loop procedure.  Please call with any questions or concerns at [phone].  Instructions for Electrophysiology Procedures ??    You will be scheduled for a procedure on: TBD    Please arrive at the Diagnostic Center on the second floor of [facility] at:TBD  Instructions: ??  1. Nothing to eat or drink6 hours prior to your procedure.  2. Medication Instructions: NO changes in your medications prior to your procedure.   Labs:  None needed preoperatively.   3. If you have sleep apnea and use a CPAP at night, please bring the CPAP unit with you.   4. Do not bring valuables with you to the hospital this includes jewelry, wallets, etc.   5. If you have a living will or a healthcare power of attorney, please bring a copy with you.   6. You are going home from the hospital on the  Day of your procedure, you must have somebody to accompany you and drive you home.   7. If you have any questions about your procedure, please call [phone] to speak with one of the Clinical Staff Members.   8. Bring a current list of medications to the hospital with you including any over the counter medications you are taking. Do not bring your medications to the hospital with you.   9. If you receive any device implant (pacemaker/defibrillator/Loop recorder) or leads adjustment surgery, you will be scheduled for follow up in 7 days with the EP/ Cardiology physician. [facility] will arrange appointment prior to discharge.) ??    Additional Instructions:  -Please remove all makeup, jewelry, body piercings, powder, perfume, and nail polish before you arrive.  -Do not shave in the area of the surgery for 2 days prior to surgery.?? If needed, a trained staff member will clip the area immediately before your surgery.  -Please wash your chest the night before and the morning of the procedure with the special soap given to you or an antibacterial soap.

## 2017-03-23 NOTE — Progress Notes (Signed)
Called patient and informed she is scheduled for 04-12-17 830 with a 0630 arrival: she verbalizes understanding and aware to arrive at [facility] 2nd floor diagnotic's day of the procedure.

## 2017-03-24 NOTE — Unmapped (Signed)
Called lm for pt to call regarding schedule change (patient  is rescheduled per Dr. Reita May  04-07-17 at 11 with a 9 am arrival.)

## 2017-03-24 NOTE — Unmapped (Signed)
Sent pt MyChart message regarding procedure schedule time.

## 2017-03-30 ENCOUNTER — Emergency Department: Admit: 2017-03-30 | Payer: MEDICARE

## 2017-03-30 ENCOUNTER — Inpatient Hospital Stay: Admit: 2017-03-30 | Discharge: 2017-03-31 | Disposition: A | Discharge status: Home / Self Care | Payer: MEDICARE

## 2017-03-30 DIAGNOSIS — R51 Headache: Secondary | ICD-10-CM

## 2017-03-30 LAB — DIFFERENTIAL
Basophils Absolute: 50 /uL (ref 0–200)
Basophils Relative: 0.5 % (ref 0.0–1.0)
Eosinophils Absolute: 109 /uL (ref 15–500)
Eosinophils Relative: 1.1 % (ref 0.0–8.0)
Lymphocytes Absolute: 2713 /uL (ref 850–3900)
Lymphocytes Relative: 27.4 % (ref 15.0–45.0)
Monocytes Absolute: 772 /uL (ref 200–950)
Monocytes Relative: 7.8 % (ref 0.0–12.0)
Neutrophils Absolute: 6257 /uL (ref 1500–7800)
Neutrophils Relative: 63.2 % (ref 40.0–80.0)

## 2017-03-30 LAB — BASIC METABOLIC PANEL
Anion Gap: 8 mmol/L (ref 3–16)
BUN: 38 mg/dL (ref 7–25)
CO2: 25 mmol/L (ref 21–33)
Calcium: 9.2 mg/dL (ref 8.6–10.3)
Chloride: 106 mmol/L (ref 98–110)
Creatinine: 1.5 mg/dL (ref 0.60–1.30)
Glucose: 95 mg/dL (ref 70–100)
Osmolality, Calculated: 297 mOsm/kg (ref 278–305)
Potassium: 4.1 mmol/L (ref 3.5–5.3)
Sodium: 139 mmol/L (ref 133–146)
eGFR AA CKD-EPI: 42 See note.
eGFR NONAA CKD-EPI: 36 See note.

## 2017-03-30 LAB — CBC
Hematocrit: 39.5 % (ref 35.0–45.0)
Hemoglobin: 13.2 g/dL (ref 11.7–15.5)
MCH: 30.7 pg (ref 27.0–33.0)
MCHC: 33.5 g/dL (ref 32.0–36.0)
MCV: 91.4 fL (ref 80.0–100.0)
MPV: 9.6 fL (ref 7.5–11.5)
Platelets: 183 10*3/uL (ref 140–400)
RBC: 4.32 10*6/uL (ref 3.80–5.10)
RDW: 13.9 % (ref 11.0–15.0)
WBC: 9.9 10*3/uL (ref 3.8–10.8)

## 2017-03-30 LAB — TROPONIN I: Troponin I: <0.04 ng/mL (ref 0.00–0.03)

## 2017-03-30 LAB — B NATRIURETIC PEPTIDE: BNP: 88 pg/mL (ref 0–100)

## 2017-03-30 NOTE — Unmapped (Signed)
Pt. Talkative and alert, no slurred words, ambulates w/o difficulty.

## 2017-03-30 NOTE — Unmapped (Signed)
I saw this patient primarily.  See my separate ED provider note.

## 2017-03-30 NOTE — Unmapped (Signed)
Pt placed on continuous cardiac monitor and pulse ox.

## 2017-03-30 NOTE — Unmapped (Signed)
X-ray at bedside for portable.

## 2017-03-30 NOTE — Unmapped (Signed)
Dr. Plash at bedside.

## 2017-03-30 NOTE — ED Triage Notes (Signed)
Patient states, I think I had another stroke 10 days ago.  Patient was hospitalized 1 month ago for a stroke. Having word finding difficulties and difficulty swallowing.  Patient reports increased headache past day also.

## 2017-03-30 NOTE — Unmapped (Signed)
You were seen for headache and loss of taste.  Follow-up with urology, as well as her primary care physician.  Return to the hospital if worsening pain, numbness, weakness, or other symptoms you feel should be evaluated.

## 2017-03-30 NOTE — Unmapped (Signed)
Joanna Lopez    Date of Service: 03/30/2017    Reason for Visit: Headache; Aphasia; and Dysphagia      Patient History     HPI:  This is a 66 y.o. female with history of Recent admission for possible stroke with extensive workup, including LP, MRI, MRA were all unrevealing apart from some chronic white matter disease presenting for Headache and loss of taste.  Patient reports that she's had a headache that started approximately 10 days ago.  Also reports that she's lost sensation of taste on the right side of her  Mouth.  No chest pain, shortness of breath, nausea, vomiting, diarrhea, consultation.  No numbness or weakness. No changes in her vision.  Reports she has some double vision which has not changed significantly.  No diarrhea or constipation.  No rash.  No other weakness in her face. No other aggravating or alleviating factors. [name], patient specifically denies any word finding difficulties or difficulty swallowing to me.       Past Medical History:   Diagnosis Date   ??? Basal cell cancer    ??? GERD (gastroesophageal reflux disease)    ??? Hyperlipidemia    ??? Hypertension    ??? IBS (irritable bowel syndrome)    ??? OSA (obstructive sleep apnea)        Past Surgical History:   Procedure Laterality Date   ??? APPENDECTOMY     ??? COLONOSCOPY N/A 01/23/2013    Procedure: COLONOSCOPY WITH MAC;  Surgeon: Lurene Shadow, MD;  Location: [facility] ENDOSCOPY;  Service: Gastroenterology;  Laterality: N/A;   ??? FLEXIBLE BRONCHOSCOPY W/ UPPER ENDOSCOPY N/A 09/17/2016    Procedure: BRONCHOSCOPY SUPER D WITH FLUORO AND BIOPSY-EBUS/NAVI;  Surgeon: Johnny Bridge, MD;  Location: [facility] ENDOSCOPY;  Service: Pulmonary;  Laterality: N/A;   ??? submastoid gland removal     ??? THORACOSCOPY Right 11/23/2016    Procedure: RIGHT SIDED VATS MIDDLE LOBE LOBECTOMY,HILAR MEDIASTINAL LYMPH NODE DISSECTION;  Surgeon: Janett Billow, MD;  Location: [facility] OR;  Service: Thoracic;  Laterality: Right;   ??? TONSILLECTOMY     ??? UVULOPALATOPHARYNGOPLASTY          Joanna Lopez  reports that she has never smoked. She has never used smokeless tobacco. She reports that she drinks alcohol. She reports that she does not use drugs.    Discharge Medication List as of 03/30/2017  8:34 PM      CONTINUE these medications which have NOT CHANGED    Details   aspirin-acetaminophen-caffeine (EXCEDRIN) 250-250-65 mg per tablet Take 2 tablets by mouth every 6 hours as needed for Pain., Historical Med      acetaminophen (TYLENOL) 325 MG tablet Take 3 tablets (975 mg total) by mouth every 6 hours as needed., Starting Thu 11/26/2016, Print      aspirin 325 MG tablet Take 1 tablet (325 mg total) by mouth daily., Starting Wed 02/24/2017, Print, Disp-30 tablet, R-0      atorvastatin (LIPITOR) 80 MG tablet Take 1 tablet (80 mg total) by mouth at bedtime., Starting Wed 02/24/2017, Print, Disp-30 tablet, R-0      dicyclomine (BENTYL) 20 mg tablet Take 20 mg by mouth 4 times a day with meals and at bedtime., Historical Med      lipase-protease-amylase (CREON) 24,000-76,000 -120,000 unit CpDR Take 2 capsules po with each meal., Normal      lisinopril (PRINIVIL,ZESTRIL) 20 MG tablet Take 10 mg by mouth daily. 10 mg (half tablet) daily, Historical Med  melatonin 3 mg Tab Take 3 mg by mouth at bedtime. PRN for sleep, Historical Med      multivitamin (MULTIVITAMIN) tablet Take 1 tablet by mouth daily., Historical Med      PARoxetine (PAXIL) 10 MG tablet Take 10 mg by mouth every morning., Until Discontinued, Historical Med      roPINIRole (REQUIP) 0.25 MG tablet Take 1 tablet (0.25 mg total) by mouth daily with dinner. Take one tablet (0.25 mg) 1-2 hours before bedtime, for three days, then take 2 tablets (0.5 mg) 1-2 hours before bedtime for four days, then take 4 tablets (1 mg) 1-2 hours before bedtime thereaf ter, Starting 06/08/2013, Until Discontinued, Print             Allergies:   Allergies as of 03/30/2017 - Fully Reviewed 03/30/2017   Allergen Reaction Noted   ??? Formaldehyde  analogues  07/21/2012   ??? Penicillins  07/21/2012   ??? Sulfa (sulfonamide antibiotics)  07/21/2012   ??? Latex Rash 12/03/2011       Review of Systems     ROS:  Pertinent positives and negatives included above; all other systems reviewed completely and negative. Specifically, the patient denies chest pain, shortness of breath, abdominal pain, nausea, vomiting, fevers, chills.      Physical Exam     ED Triage Vitals   Vital Signs Group      Temp       Temp src       Pulse       Heart Rate Source       Resp       SpO2       BP       MAP (mmHg)       BP Location       BP Method       Patient Position    SpO2    O2 Device        General:  Well-appearing female sitting in bed, no acute distress    HEENT:  Head atraumatic, pupils equal round and reactive to light, extraocular movements intact, sclera clear, mucus membranes moist, oropharynx nonerythematous    Neck:  Supple, no lymphadenopathy    Pulmonary:   Clear to auscultation bilaterally, no wheezes, rhonchi, or rales    Cardiac:  Regular rate and rhythm, normal S1S2, no murmurs, rubs, or gallops    Abdomen:  Soft, nontender, nondistended, no rebound and no guarding    Musculoskeletal:  No obvious deformities, no tenderness to palpation    Vascular:  2+ radial pulses bilaterally    Skin:  Warm, dry, well perfused, no rashes    Neuro:  AAOx4.  CN 2-12 intact.  Normal gait.  Sensation intact.  Strength grossly equal and symmetric. Normal finger nose finger, normal gait, no facial droop, normal extraocular movements with normal strength toshoulder flexion,, extension, elbow flexion, extension, wrist flexion, extension, hip flexion, extension, knee flexion, extension, and ankle dorsiflexion and plantarflexion. Normal speech.      Diagnostic Studies     Labs:    Please see electronic medical record for any tests performed in the ED     Radiology:    Please see electronic medical record for any tests performed in the ED    EKG:    Normal sinus rhythm.  Normal axis.  No ST  segment elevation or depression.  No T-wave inversions.  QTC442 ms. No significant ChangecomparedtopriorEKGdated11/12/thousand18    Emergency Department Procedures     None  ED Course and MDM   Joanna Lopez is a 66 y.o. female patient who presented to the emergency department with lost of taste.  Nursing notes were reviewed, applicable prior records were reviewed, and a full history and physical examination was performed by me.    ED Course as of Mar 30 2124   Tue Mar 30, 2017   1610 Patient presents with loss of tasteon the right side of her tongue.  CT head shows no evidence of acute stroke.  Given her recent extensive stroke workup, we'll discuss with neurology, as may not require admission at this time.  [WP]   2018 Discussed with Dr. Burman Blacksmith of neurology.  After discussion, we are in agreement given that the patient has  a completely nonfocal neurologic exam apart from decreased sensation to take stone her tongue and given her extensive workup recently, that she does not write admission for further workup.  She was advised to follow up as an outpatient with neurology.  She has no evidence of other infection.  She'll be given strict verbal discharge instructions and return precautions.  [WP]      ED Course User Index  [WP] Consuello Closs, MD     She is no evidence of infection, no evidence of other etiology of her loss of taste.  She is scheduled for outpatient Holter monitor.  She was given a referral to neurology, as well as advised to follow up with her primary care physician and her cardiologist.  She is well-appearing otherwise, will be discharged home with strict verbal discharge instructions and return precautions    At this time the patient has been deemed safe for discharge. My customary discharge instructions including strict return precautions for worsening or new symptoms have been communicated.    Summary of Treatment in ED:  Medications - No data to display    Impression     1.  Nonintractable episodic headache, unspecified headache type         Plan     1) The patient is discharged to home in good condition.  2)    Joanna Lopez, Joanna Lopez   Home Medication Instructions RUE:[id number]    Printed on:03/30/17 1721   Medication Information                      acetaminophen (TYLENOL) 325 MG tablet  Take 3 tablets (975 mg total) by mouth every 6 hours as needed.             aspirin 325 MG tablet  Take 1 tablet (325 mg total) by mouth daily.             atorvastatin (LIPITOR) 80 MG tablet  Take 1 tablet (80 mg total) by mouth at bedtime.             dicyclomine (BENTYL) 20 mg tablet  Take 20 mg by mouth 4 times a day with meals and at bedtime.             lipase-protease-amylase (CREON) 24,000-76,000 -120,000 unit CpDR  Take 2 capsules po with each meal.             lisinopril (PRINIVIL,ZESTRIL) 20 MG tablet  Take 10 mg by mouth daily. 10 mg (half tablet) daily             melatonin 3 mg Tab  Take 3 mg by mouth at bedtime. PRN for sleep  multivitamin (MULTIVITAMIN) tablet  Take 1 tablet by mouth daily.             PARoxetine (PAXIL) 10 MG tablet  Take 10 mg by mouth every morning.             roPINIRole (REQUIP) 0.25 MG tablet  Take 1 tablet (0.25 mg total) by mouth daily with dinner. Take one tablet (0.25 mg) 1-2 hours before bedtime, for three days, then take 2 tablets (0.5 mg) 1-2 hours before bedtime for four days, then take 4 tablets (1 mg) 1-2 hours before bedtime thereafter               3) The patient is instructed to return to the emergency department should her symptoms worsen or any concern she believes warrants acute physician evaluation.     Consuello Closs, MD  Emergency Medicine         Consuello Closs, MD  03/30/17 2125

## 2017-03-30 NOTE — Unmapped (Signed)
Pt back from CT via stretcher per tech. Dr. Rosalee Kaufman at bedside.

## 2017-03-30 NOTE — ED Notes (Signed)
Dr. Rosalee Kaufman at bedside.

## 2017-04-02 NOTE — Unmapped (Signed)
Patient ws seen by dr Lucretia Field 03/10/17; expected to RTC :  Joanna Lopez to return to see Dr. Ladell Pier (2-74mo)    As per clinic protocol she needs to get ok from both of [place]; I will defer to dr Lucretia Field if ok to tx t general ; if he is ok she can see me

## 2017-04-02 NOTE — Telephone Encounter (Signed)
Pt would like to see Dr. Roswell Nickel due to location. Pt expressed that she had a stroke 10 days ago second stroke in November.

## 2017-04-05 NOTE — Unmapped (Signed)
Pt is calling to see if Dr Roswell Nickel received a message last week about becoming a patient. Pt is requesting to see Dr Roswell Nickel ASAP this week. Pt is wanting to see Dr Roswell Nickel regarding having 2 mild strokes in the last few months and never had a neurological problem before. Pt states she has lost the sense of taste and double vision. Pt also states she is having a hard time trying to figure things out while reading, cognitive changes. Pt states she only really trusts Dr Milagros Evener judgement, pt's husband is already a patient and she trusts Dr Roswell Nickel.

## 2017-04-05 NOTE — Unmapped (Signed)
Please refer to tele encounter from 04/02/17.      Waiting on Dr. Pollyann Savoy approval on this.

## 2017-04-07 MED ORDER — clopidogrel (PLAVIX) 75 mg tablet
75 | ORAL_TABLET | Freq: Every day | ORAL | 1 refills | Status: AC
Start: 2017-04-07 — End: 2017-07-16

## 2017-04-07 NOTE — Unmapped (Signed)
Implantable Loop Recorder Placement, Care After  Refer to this sheet in the next few weeks. These instructions provide you with information about caring for yourself after your procedure. Your health care provider may also give you more specific instructions. Your treatment has been planned according to current medical practices, but problems sometimes occur. Call your health care provider if you have any problems or questions after your procedure.  What can I expect after the procedure?  After the procedure, it is common to have:  ?? Soreness or pain near the cut from surgery (incision).  ?? Some swelling or bruising near the incision.  Follow these instructions at home:  Medicines  ?? Take over-the-counter and prescription medicines only as told by your health care provider.  ?? If you were prescribed an antibiotic medicine, take it as told by your health care provider. Do not stop taking the antibiotic even if you start to feel better.  Bathing  ?? Do not take baths, swim, or use a hot tub until your health care provider approves. Ask your health care provider if you can take showers. You may only be allowed to take sponge baths for bathing.  Incision care  ?? Follow instructions from your health care provider about how to take care of your incision. Make sure you:  ?? Wash your hands with soap and water before you change your bandage (dressing). If soap and water are not available, use hand sanitizer.  ?? Change your dressing as told by your health care provider.  ?? Keep your dressing dry.  ?? Leave stitches (sutures), skin glue, or adhesive strips in place. These skin closures may need to stay in place for 2 weeks or longer. If adhesive strip edges start to loosen and curl up, you may trim the loose edges. Do not remove adhesive strips completely unless your health care provider tells you to do that.  ?? Check your incision area every day for signs of infection. Check for:  ?? More redness, swelling, or pain.  ?? Fluid or  blood.  ?? Warmth.  ?? Pus or a bad smell.  Driving  ?? If you received a sedative, do not drive for 24 hours after the procedure.  ?? If you did not receive a sedative, ask your health care provider when it is safe to drive.  Activity  ?? Return to your normal activities as told by your health care provider. Ask your health care provider what activities are safe for you.  ?? Until your health care provider says it is safe:  ?? Do not lift anything that is heavier than 10 lb (4.5 kg).  ?? Do not do activities that involve lifting your arms over your head.  General instructions    ?? Follow instructions from your health care provider about how and when to use your implantable loop recorder.  ?? Do not go through a metal detection gate, and do not let someone hold a metal detector over your chest. Show your ID card.  ?? Do not have an MRI unless you check with your health care provider first.  ?? Do not use any tobacco products, such as cigarettes, chewing tobacco, and e-cigarettes. Tobacco can delay healing. If you need help quitting, ask your health care provider.  ?? Keep all follow-up visits as told by your health care provider. This is important.  ?? Take bandage off in 24 hours. Leave steri strips on until they fall off.   ?? DO NOT get site  wet for 3 days.  Contact a health care provider if:  ?? You have more redness, swelling, or pain around your incision.  ?? You have more fluid or blood coming from your incision.  ?? Your incision feels warm to the touch.  ?? You have pus or a bad smell coming from your incision.  ?? You have a fever.  ?? You have pain that is not relieved by your pain medicine.  ?? You have triggered your device because of fainting (syncope) or because of a heartbeat that feels like it is racing, slow, fluttering, or skipping (palpitations).  Get help right away if:  ?? You have chest pain.  ?? You have difficulty breathing.  This information is not intended to replace advice given to you by your health care  provider. Make sure you discuss any questions you have with your health care provider.  Document Released: 04/08/2015 Document Revised: 10/03/2015 Document Reviewed: 01/30/2015  Elsevier Interactive Patient Education ?? 2018 [employer].

## 2017-04-07 NOTE — Unmapped (Signed)
Joanna Lopez is an 66 y.o. female.    History: here for ILR implant.    HPI   Please see HPI dated 03/22/17. Briefly, 66 year old female with TIA and palpitations, here for ILR implant for long term cardiac rhythm monitoring.    Past Medical History:   Diagnosis Date   ??? Basal cell cancer    ??? GERD (gastroesophageal reflux disease)    ??? Hyperlipidemia    ??? Hypertension    ??? IBS (irritable bowel syndrome)    ??? OSA (obstructive sleep apnea)      Past Surgical History:   Procedure Laterality Date   ??? APPENDECTOMY     ??? COLONOSCOPY N/A 01/23/2013    Procedure: COLONOSCOPY WITH MAC;  Surgeon: Lurene Shadow, MD;  Location: [facility] ENDOSCOPY;  Service: Gastroenterology;  Laterality: N/A;   ??? FLEXIBLE BRONCHOSCOPY W/ UPPER ENDOSCOPY N/A 09/17/2016    Procedure: BRONCHOSCOPY SUPER D WITH FLUORO AND BIOPSY-EBUS/NAVI;  Surgeon: Johnny Bridge, MD;  Location: [facility] ENDOSCOPY;  Service: Pulmonary;  Laterality: N/A;   ??? submastoid gland removal     ??? THORACOSCOPY Right 11/23/2016    Procedure: RIGHT SIDED VATS MIDDLE LOBE LOBECTOMY,HILAR MEDIASTINAL LYMPH NODE DISSECTION;  Surgeon: Janett Billow, MD;  Location: [facility] OR;  Service: Thoracic;  Laterality: Right;   ??? TONSILLECTOMY     ??? UVULOPALATOPHARYNGOPLASTY       Family History   Problem Relation Age of Onset   ??? Adopted: Yes   ??? Diabetes Son      Social History   Substance Use Topics   ??? Smoking status: Never Smoker   ??? Smokeless tobacco: Never Used   ??? Alcohol use Yes      Comment: 2-3 wine/week     Allergies   Allergen Reactions   ??? Formaldehyde Analogues    ??? Penicillins    ??? Sulfa (Sulfonamide Antibiotics)    ??? Latex Rash     States she has a sensitivity to latex after being around it for days, she gets a rash: she states that when she wore her cpap machine for 14 days she developed a rash on her face other than this she has no problems stating her dentist wears latex gloves and she has no problems with latex gloves.        Prior to Admission:  Prescriptions Prior to  Admission   Medication Sig Dispense Refill Last Dose   ??? acetaminophen (TYLENOL) 325 MG tablet Take 3 tablets (975 mg total) by mouth every 6 hours as needed. 60 tablet 0 Past Month at Unknown time   ??? aspirin 325 MG tablet Take 1 tablet (325 mg total) by mouth daily. 30 tablet 0 04/07/2017 at Unknown time   ??? aspirin-acetaminophen-caffeine (EXCEDRIN) 250-250-65 mg per tablet Take 2 tablets by mouth every 6 hours as needed for Pain.   04/06/2017 at Unknown time   ??? atorvastatin (LIPITOR) 80 MG tablet Take 1 tablet (80 mg total) by mouth at bedtime. 30 tablet 0 04/07/2017 at Unknown time   ??? dicyclomine (BENTYL) 20 mg tablet Take 20 mg by mouth 4 times a day with meals and at bedtime.   04/07/2017 at Unknown time   ??? lipase-protease-amylase (CREON) 24,000-76,000 -120,000 unit CpDR Take 2 capsules po with each meal. (Patient taking differently: Take 2 capsules po with each meal.  Taking PRN Indications: exocrine pancreatic insufficiency         ) 180 capsule 6 Past Month at Unknown time   ??? lisinopril (  PRINIVIL,ZESTRIL) 20 MG tablet Take 10 mg by mouth daily. 10 mg (half tablet) daily   04/07/2017 at Unknown time   ??? PARoxetine (PAXIL) 10 MG tablet Take 10 mg by mouth every morning.   04/07/2017 at Unknown time   ??? roPINIRole (REQUIP) 0.25 MG tablet Take 1 tablet (0.25 mg total) by mouth daily with dinner. Take one tablet (0.25 mg) 1-2 hours before bedtime, for three days, then take 2 tablets (0.5 mg) 1-2 hours before bedtime for four days, then take 4 tablets (1 mg) 1-2 hours before bedtime thereafter 95 tablet 5 04/07/2017 at Unknown time   ??? melatonin 3 mg Tab Take 3 mg by mouth at bedtime. PRN for sleep   Taking   ??? multivitamin (MULTIVITAMIN) tablet Take 1 tablet by mouth daily.   Taking     Scheduled Meds:  Continuous Infusions:  PRN Meds:  Principal Problem:    History of CVA (cerebrovascular accident)    Blood pressure 112/75, pulse 58, resp. rate 16, height 5' 7 (1.702 m), weight 200 lb (90.7 kg), SpO2 97  %.    Review of Systems   Unchanged from my office note dated 03/22/17.    Physical Exam   Unchanged from my office note dated 03/22/17.    Assessment/Plan:  Stable to proceed with ILR implant.    Tennis Must  04/07/2017

## 2017-04-07 NOTE — Telephone Encounter (Signed)
Spoke with pt.  She was just nervous about upcoming testing from PCP.  Pt will keep apt in March and call our office if she needs anything unless it is an emergency, then advised to call 911 or go to ER.

## 2017-04-07 NOTE — Unmapped (Signed)
Loop Implant (Medtronic)????Procedure Note  ??  Pre-op Diagnosis:??Cryptogenic stroke/ TIA, palpitations  ??  Post-op Diagnosis: Same  ??  Procedure(s):  Loop Recorder??Implant ??(Medtronic)  ??  ??  Surgeon(s):  Tennis Must, MD  ??  Anesthesia: Local  ??  Estimated Blood Loss:??<40ml  ??  Procedure detail:   Informed consent was obtained from the patient.  Incision site was marked in the left parasternal fourth intercostal space.??Patient was prepped and draped in a sterile fashion. Local anesthesia was injected subcutaneously. A <1??cm incision was made over the marked??site. A device pocket was created with the tunneling tool. Using the Medtronic LINQ delivery system, a Reveal [place], Model [id number]??(SN: [id number]) was inserted into the pocket. R wave sensing was found to be adequate (0.26??mV). Hemostasis was achieved. Steristrips and sterile dressing were applied. There was no apparent complication.  ??  The patient left the EP lab in stable condition.  ??  I was physically present for the entire procedure.  ??  Recommendation:  1) Stable for discharge home.  2) Wound care instructions given.  3) Continue current medications.  4) Remote ILR monitoring.  ??  There were no complications unless listed below.

## 2017-04-07 NOTE — Telephone Encounter (Signed)
Pt would like to know if there is any way she can be seen sooner than march.

## 2017-04-07 NOTE — Telephone Encounter (Signed)
Dr. Lucretia Field has approved on 11/23   will this be okay with you Dr. Rachel Bo, pt feels like she needs to be seen asap.

## 2017-04-07 NOTE — Unmapped (Signed)
Called pt - she feels that on 11/11 she had another stroke - she got severe headache, weird rubbery / numby feeling in back of her throat, unsteady, confused.  Came on suddenly while she was at church.  Headache was severe front top or right side, behind right eye, above right eye. HA has not disappeared. Now maxed out on excedrin migraine, with severe headache.      After first stroke, taste sensation in center of tongue disappeared, and after this event, taste sensation on right lateral aspect of tongue now also gone.      Eventually saw PCP who suggested she go to ED, where CT head was neg for stroke.  No MRI done.     Now taking lots of liquids in order to swallows.      Plan   - speech therapy / swallowing eval / tricks for safe swallowing  - repeat CTA head to ensure that she has not extended this fusiform dilatation.  - if CTA is neg for changes to dilatation, I plan to add plavix to her aspirin for 1-33mo (med faxed to pharmacy)  - MRI now would be unrevealing.   - encouraged her to call 911 for new stroke sx.s  - OK to continue to follow w Dr Roswell Nickel    Ladell Pier MD, PhD  Vascular Neurology and   NeuroInterventional Surgery  04/07/2017 12:49 PM

## 2017-04-08 ENCOUNTER — Inpatient Hospital Stay: Payer: MEDICARE | Attending: Neurology

## 2017-04-08 NOTE — Unmapped (Signed)
Theresa WC CT. States the pt's labs are elevated from October and the CT with contrast cannot be done. Please advise.

## 2017-04-08 NOTE — Unmapped (Addendum)
Joanna Lopez requesting to speak with MD about her results , states she wad told to discuss cancellation of CT with Dr Lucretia Field     Patient can be reached at [phone] (home)

## 2017-04-08 NOTE — Unmapped (Signed)
Aggie Cosier called again in regards to the pt's CT order. Unable to reach MA at the desk. States she will call the pt and cancel the appt.

## 2017-04-09 NOTE — Unmapped (Signed)
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

## 2017-04-09 NOTE — Unmapped (Signed)
Spoke with patient, she is leaving town today and wanted to know if she could do the Hydration and CT today. Advised physician out of office until Monday will reschedule CT scan to next week.

## 2017-04-10 ENCOUNTER — Emergency Department (HOSPITAL_COMMUNITY): Payer: Medicare Other

## 2017-04-10 ENCOUNTER — Encounter (HOSPITAL_COMMUNITY): Payer: Self-pay | Admitting: *Deleted

## 2017-04-10 ENCOUNTER — Emergency Department (HOSPITAL_COMMUNITY)
Admission: EM | Admit: 2017-04-10 | Discharge: 2017-04-10 | Disposition: A | Discharge status: Home / Self Care | Payer: Medicare Other | Attending: Emergency Medicine | Admitting: Emergency Medicine

## 2017-04-10 ENCOUNTER — Other Ambulatory Visit: Payer: Self-pay

## 2017-04-10 DIAGNOSIS — R110 Nausea: Secondary | ICD-10-CM | POA: Insufficient documentation

## 2017-04-10 DIAGNOSIS — Z7901 Long term (current) use of anticoagulants: Secondary | ICD-10-CM | POA: Insufficient documentation

## 2017-04-10 DIAGNOSIS — Z8673 Personal history of transient ischemic attack (TIA), and cerebral infarction without residual deficits: Secondary | ICD-10-CM | POA: Insufficient documentation

## 2017-04-10 DIAGNOSIS — R51 Headache: Secondary | ICD-10-CM | POA: Diagnosis not present

## 2017-04-10 DIAGNOSIS — I2510 Atherosclerotic heart disease of native coronary artery without angina pectoris: Secondary | ICD-10-CM | POA: Insufficient documentation

## 2017-04-10 DIAGNOSIS — H532 Diplopia: Secondary | ICD-10-CM | POA: Diagnosis not present

## 2017-04-10 DIAGNOSIS — Z79899 Other long term (current) drug therapy: Secondary | ICD-10-CM | POA: Diagnosis not present

## 2017-04-10 DIAGNOSIS — R519 Headache, unspecified: Secondary | ICD-10-CM

## 2017-04-10 HISTORY — DX: Malignant (primary) neoplasm, unspecified: C80.1

## 2017-04-10 HISTORY — DX: Atherosclerotic heart disease of native coronary artery without angina pectoris: I25.10

## 2017-04-10 HISTORY — DX: Cerebral infarction, unspecified: I63.9

## 2017-04-10 LAB — CBC WITH DIFFERENTIAL/PLATELET
BASOS ABS: 0 10*3/uL (ref 0.0–0.1)
BASOS PCT: 0 %
EOS ABS: 0.1 10*3/uL (ref 0.0–0.7)
EOS PCT: 2 %
HCT: 37.2 % (ref 36.0–46.0)
Hemoglobin: 12.5 g/dL (ref 12.0–15.0)
Lymphocytes Relative: 34 %
Lymphs Abs: 3 10*3/uL (ref 0.7–4.0)
MCH: 30.9 pg (ref 26.0–34.0)
MCHC: 33.6 g/dL (ref 30.0–36.0)
MCV: 92.1 fL (ref 78.0–100.0)
MONO ABS: 0.8 10*3/uL (ref 0.1–1.0)
Monocytes Relative: 9 %
Neutro Abs: 5 10*3/uL (ref 1.7–7.7)
Neutrophils Relative %: 55 %
PLATELETS: 163 10*3/uL (ref 150–400)
RBC: 4.04 MIL/uL (ref 3.87–5.11)
RDW: 13.3 % (ref 11.5–15.5)
WBC: 8.9 10*3/uL (ref 4.0–10.5)

## 2017-04-10 LAB — BASIC METABOLIC PANEL
ANION GAP: 6 (ref 5–15)
BUN: 22 mg/dL — ABNORMAL HIGH (ref 6–20)
CALCIUM: 9.1 mg/dL (ref 8.9–10.3)
CO2: 24 mmol/L (ref 22–32)
Chloride: 112 mmol/L — ABNORMAL HIGH (ref 101–111)
Creatinine, Ser: 1.11 mg/dL — ABNORMAL HIGH (ref 0.44–1.00)
GFR, EST AFRICAN AMERICAN: 59 mL/min — AB (ref >60)
GFR, EST NON AFRICAN AMERICAN: 51 mL/min — AB (ref >60)
GLUCOSE: 90 mg/dL (ref 65–99)
Potassium: 3.9 mmol/L (ref 3.5–5.1)
SODIUM: 142 mmol/L (ref 135–145)

## 2017-04-10 MED ORDER — DIPHENHYDRAMINE HCL 50 MG/ML IJ SOLN
25.0000 mg | Freq: Once | INTRAMUSCULAR | Status: AC
  Administered 2017-04-10: 25 mg via INTRAVENOUS
  Filled 2017-04-10: qty 1

## 2017-04-10 MED ORDER — PROCHLORPERAZINE EDISYLATE 5 MG/ML IJ SOLN
10.0000 mg | Freq: Once | INTRAMUSCULAR | Status: AC
  Administered 2017-04-10: 10 mg via INTRAVENOUS
  Filled 2017-04-10: qty 2

## 2017-04-10 MED ORDER — TETRACAINE HCL 0.5 % OP SOLN
2.0000 [drp] | Freq: Once | OPHTHALMIC | Status: AC
  Administered 2017-04-10: 2 [drp] via OPHTHALMIC
  Filled 2017-04-10: qty 4

## 2017-04-10 MED ORDER — SODIUM CHLORIDE 0.9 % IV BOLUS (SEPSIS)
1000.0000 mL | Freq: Once | INTRAVENOUS | Status: AC
  Administered 2017-04-10: 1000 mL via INTRAVENOUS

## 2017-04-10 MED ORDER — OXYCODONE-ACETAMINOPHEN 5-325 MG PO TABS: 1.0000 | ORAL_TABLET | ORAL | 0 refills | Status: AC | PRN

## 2017-04-10 MED ORDER — OXYCODONE-ACETAMINOPHEN 5-325 MG PO TABS
1.0000 | ORAL_TABLET | Freq: Once | ORAL | Status: AC
  Administered 2017-04-10: 1 via ORAL
  Filled 2017-04-10: qty 1

## 2017-04-10 MED ORDER — IOPAMIDOL (ISOVUE-370) INJECTION 76%
INTRAVENOUS | Status: AC
  Administered 2017-04-10: 100 mL
  Filled 2017-04-10: qty 100

## 2017-04-10 NOTE — ED Notes (Signed)
Pt requesting more pain medications for her headache.  Dr. Sherry Ruffing made aware.

## 2017-04-10 NOTE — ED Provider Notes (Signed)
[facility] DEPT Provider Note   CSN: [id number] Arrival date & time: 04/10/17  1308     History   Chief Complaint Chief Complaint  Patient presents with  . Headache  . Nausea  . Eye Pain    HPI Taylor Cameron is a 66 y.o. female.  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 Hadn't been under chemo or radiation. Oct 12th stroke, had double vision, loss of taste in tongue, then had stroke 11/11, had headache Wednesday had heart monitor embedded to evaluate for signs of atrial fibrillation Since the first stroke headache has been worse and worse Since 7PM last night was worse, located around right eye, in the right eye, feels like significant eye pressure, "like it's going to explode" Turning off the lights helps, sunglasses help Wakes up in the middle of the night with it, worse last night Denies vomiting, reports nausea Reported had ischemic damage to eye muscle at eye doctor's office  No numbness/weakness/difficulty speaking/walking Having persistent double vision since stroke without change.  Lives in [place], husband has Lewy Body Dementia Arrived 10PM last night On the way down had nausea and headache Woke up this morning with more severe headache Was supposed to have ct with contrast of head, but Cr was too low, Neurointerventionalist discussed this   Past Medical History:  Diagnosis Date  . Cancer ([place])   . Coronary artery disease   . Stroke [facility])     There are no active problems to display for this patient.   Past Surgical History:  Procedure Laterality Date  . APPENDECTOMY    . mastoid gland    . THROAT SURGERY      OB History    No data available       Home Medications    Prior to Admission medications   Medication Sig Start Date End Date Taking? Authorizing Provider  aspirin EC 325 MG tablet Take 325 mg by mouth daily.    Yes [provider]  aspirin-acetaminophen-caffeine (EXCEDRIN MIGRAINE) [phone] MG tablet Take 2 tablets by mouth every 6 (six) hours as needed for headache or migraine.   Yes [provider]  atorvastatin (LIPITOR) 80 MG tablet Take 80 mg by mouth daily.   Yes [provider]  clopidogrel (PLAVIX) 75 MG tablet Take 75 mg by mouth daily. 04/07/17  Yes [provider]  CREON 24000-76000 units CPEP Take 1-2 tablets by mouth 3 (three) times daily with meals as needed (ibs).  01/01/17  Yes [provider]  dicyclomine (BENTYL) 10 MG capsule Take 10 mg by mouth 4 (four) times daily -  before meals and at bedtime.   Yes [provider]  hydrochlorothiazide (HYDRODIURIL) 25 MG tablet Take 25 mg by mouth daily. 03/22/17  Yes [provider]  lisinopril (PRINIVIL,ZESTRIL) 20 MG tablet Take 20 mg by mouth daily.    Yes [provider]  PARoxetine (PAXIL) 10 MG tablet Take 10 mg by mouth daily.    Yes [provider]  promethazine (PHENERGAN) 25 MG tablet Take 25 mg by mouth every 8 (eight) hours as needed for nausea or vomiting.   Yes [provider]  rOPINIRole (REQUIP) 0.25 MG tablet Take 0.25 mg by mouth daily.   Yes [provider]    Family History No family history on file.  Social History Social History   Tobacco Use  . Smoking status:  Never Smoker  . Smokeless tobacco: Never Used  Substance Use Topics  . Alcohol use: No    Frequency: Never  . Drug use: No     Allergies   Penicillins; Formaldehyde; Phenergan [promethazine hcl]; and Sulfa antibiotics   Review of Systems Review of Systems  Constitutional: Negative for fever.  HENT: Negative for sore throat.   Eyes: Positive for photophobia and pain. Negative for visual disturbance.  Respiratory: Negative for cough and shortness of breath.   Cardiovascular: Negative for chest pain.  Gastrointestinal: Positive for constipation (hard  stool) and nausea. Negative for abdominal pain and vomiting.  Genitourinary: Negative for difficulty urinating.  Musculoskeletal: Negative for back pain and neck pain.  Skin: Negative for rash.  Neurological: Positive for headaches. Negative for syncope, facial asymmetry, speech difficulty, weakness and numbness.     Physical Exam Updated Vital Signs BP 122/71   Pulse (!) 55   Temp (!) 97.5 F (36.4 C) (Oral)   Resp 19   Ht 5\' 7"  (1.702 m)   Wt 90.7 kg (200 lb)   SpO2 93%   BMI 31.32 kg/m   Physical Exam  Constitutional: She is oriented to person, place, and time. She appears well-developed and well-nourished. No distress.  HENT:  Head: Normocephalic and atraumatic.  Eyes: Conjunctivae and EOM are normal.  Neck: Normal range of motion.  Cardiovascular: Normal rate, regular rhythm, normal heart sounds and intact distal pulses. Exam reveals no gallop and no friction rub.  No murmur heard. Pulmonary/Chest: Effort normal and breath sounds normal. No respiratory distress. She has no wheezes. She has no rales.  Abdominal: Soft. She exhibits no distension. There is no tenderness. There is no guarding.  Musculoskeletal: She exhibits no edema or tenderness.  Neurological: She is alert and oriented to person, place, and time.  Skin: Skin is warm and dry. No rash noted. She is not diaphoretic. No erythema.  Nursing note and vitals reviewed.    ED Treatments / Results  Labs (all labs ordered are listed, but only abnormal results are displayed) Labs Reviewed  BASIC METABOLIC PANEL - Abnormal; Notable for the following components:      Result Value   Chloride 112 (*)    BUN 22 (*)    Creatinine, Ser 1.11 (*)    GFR calc non Af Amer 51 (*)    GFR calc Af Amer 59 (*)    All other components within normal limits  CBC WITH DIFFERENTIAL/PLATELET  CBC WITH DIFFERENTIAL/PLATELET  BASIC METABOLIC PANEL    EKG  EKG Interpretation None       Radiology No results  found.  Procedures Procedures (including critical care time)  Medications Ordered in ED Medications  tetracaine (PONTOCAINE) 0.5 % ophthalmic solution 2 drop (2 drops Both Eyes Given 04/10/17 1408)  sodium chloride 0.9 % bolus 1,000 mL (0 mLs Intravenous Stopped 04/10/17 1702)  prochlorperazine (COMPAZINE) injection 10 mg (10 mg Intravenous Given 04/10/17 1509)  diphenhydrAMINE (BENADRYL) injection 25 mg (25 mg Intravenous Given 04/10/17 1508)  iopamidol (ISOVUE-370) 76 % injection (100 mLs  Contrast Given 04/10/17 1737)     Initial Impression / Assessment and Plan / ED Course  I have reviewed the triage vital signs and the nursing notes.  Pertinent labs & imaging results that were available during my care of the patient were reviewed by me and considered in my medical decision making (see chart for details).    66 year old female with a history of right-sided lung cancer post lobectomy, with  indolent left-sided tumor present per family that is being observed, with no history of chemotherapy or radiation, history of CAD, CVA in October and again in November, with residual diplopia, presents with concern for headache and nausea.  The headache has been ongoing since her initial stroke in October, however worsened last night.  Headache with slow onset of symptoms, have low suspicion for his subarachnoid hemorrhage.  She has no new neurologic symptoms on history or exam, and have low suspicion that headache represents CVA.  Patient has undergone extensive stroke workup in the recent past, and is currently wearing a loop recorder.  She is given Compazine and Benadryl with improvement of her headache.  Labs show creatinine is 1.11, with GFR improved from what family thought it had been most recently.  She was given IV fluids for suspected dehydration as well as headache cocktail.  They report that her neuro interventional has had wanted to order a CT angios while she was in [place], however her creatinine  was not appropriate at that time.  Given patient describing more severe, worsening headache, this prior recommendation, as well as to get further evaluation for possible muscle, upper aneurysm, a CT angios was ordered in the emergency department for further evaluation.  Check patient's eye pressures, which were 15 bilaterally, no sign of acute angle-closure glaucoma.  CT angios pending at time of transfer care to Dr. Sherry Ruffing.  If CT angios shows no sign of emergent pathology, will recommend continued follow-up with neurologist, ophthalmologist, and primary care physician.   Final Clinical Impressions(s) / ED Diagnoses   Final diagnoses:  Nonintractable headache, unspecified chronicity pattern, unspecified headache type  Nausea    ED Discharge Orders    None       Gareth Morgan, MD 04/10/17 [phone]

## 2017-04-10 NOTE — ED Notes (Signed)
Patient transported to CT 

## 2017-04-10 NOTE — ED Notes (Addendum)
Pt returned from [place]

## 2017-04-10 NOTE — ED Notes (Signed)
Pt has a Insertable Cardiac Monitor put in this past Wednesday for A-fib.  Pt was diagnosed with lung cancer and has had a lobectomy this July of this year. (removed right lower lobe)

## 2017-04-10 NOTE — ED Provider Notes (Signed)
6:46 PM Care assumed from Dr. Billy Fischer.  At time of transfer of care, patient is awaiting results of CTA of the head and neck and headache cocktail/fluids.  According to previous team, patient has a history of strokes and headaches but is here from [place] visiting family.  She reports that she had a headache with previous stroke and before coming to [place] she was supposed to get a CTA of the head and neck.  Due to her headache, patient will receive CTA.  Patient also is receiving headache cocktail.  Patient's headache has improved and CT imaging is reassuring, anticipate patient will be safe for discharge home.  According to previous team, patient has no significant neurologic deficits different from prior.  9:42 PM CT imaging was overall unremarkable.  No evidence of aneurysm or bleed.  Patient had improvement in her headache after pain medications.  Next  Patient was reassured of findings.  Patient will follow up with her neurologist and PCP back in her home town.  Patient given prescription for several doses of pain medication.  Patient understood return precautions for any new or worsened symptoms.  Patient and family had no other questions or concerns and patient was discharged in good condition with improving symptoms.     Clinical Impression: 1. Nonintractable headache, unspecified chronicity pattern, unspecified headache type   2. Nausea     Disposition: Discharge  Condition: Good  I have discussed the results, Dx and Tx plan with the pt(& family if present). He/she/they expressed understanding and agree(s) with the plan. Discharge instructions discussed at great length. Strict return precautions discussed and pt &/or family have verbalized understanding of the instructions. No further questions at time of discharge.    This SmartLink is deprecated. Use AVSMEDLIST instead to display the medication list for a patient.  Follow Up: [facility]PT [place] [id number] [address] [place] [address] [phone]  If symptoms worsen     Hillman Attig, Gwenyth Allegra, MD 04/10/17 [phone]

## 2017-04-10 NOTE — Discharge Instructions (Signed)
Your CT imaging today showed no acute abnormalities.  Please follow-up with your neurologist and primary care team back in your hometown.  Please use the pain medicine to help with your symptoms as it helped today.  If any symptoms change or worsen, please return to the nearest emergency department.

## 2017-04-10 NOTE — ED Triage Notes (Signed)
Pt visiting from [place], has history of left lung cancer, 03/21/17 and 02/19/17 dx with mild strokes. Today presents with headache and rt eye pain with nausea, Unclear to when symptoms actually started or if they are on going.

## 2017-04-10 NOTE — ED Notes (Signed)
Requested Ultra sound IV from [facility].

## 2017-04-13 NOTE — Telephone Encounter (Signed)
Called patient. States that she is currently out of town for a family emergency.  Asked that she return call when she returned and we would schedule with physician.  She has already removed dressing and states there is no signs of infection.  Will call if there are any questions or concerns sooner.  Otherwise, will call when she returns to schedule follow up with physician.

## 2017-04-13 NOTE — Unmapped (Signed)
Pt called to cancel and reschedule appt for 12.5.18 at 2:00pm with RN.  Please advise, pt can be reached at [phone].

## 2017-04-23 NOTE — Unmapped (Signed)
Pt called. Pt stated that wound area is looking great and shes having no problems, I notified her then she does not need an appointment as her loop implant was on 04/07/17. 3 month follow-up scheduled with dr Reita May and pt verbalized understanding to all above. Phone call completed.

## 2017-04-23 NOTE — Telephone Encounter (Signed)
Patient is calling to reschedule her appointment with Sharla Kidney for a follow up . Patient can be reached at [phone]

## 2017-06-07 ENCOUNTER — Inpatient Hospital Stay: Admit: 2017-06-07 | Payer: MEDICARE | Attending: Cardiovascular Disease

## 2017-06-07 DIAGNOSIS — I639 Cerebral infarction, unspecified: Secondary | ICD-10-CM

## 2017-06-07 NOTE — Unmapped (Signed)
Device interrogation reviewed.  ILR interrogation shows sinus rhythm.  No AF.

## 2017-06-16 ENCOUNTER — Inpatient Hospital Stay: Admit: 2017-06-16 | Payer: MEDICARE

## 2017-06-16 ENCOUNTER — Ambulatory Visit: Admit: 2017-06-16 | Payer: MEDICARE | Attending: Neurology

## 2017-06-16 DIAGNOSIS — Z08 Encounter for follow-up examination after completed treatment for malignant neoplasm: Secondary | ICD-10-CM

## 2017-06-16 DIAGNOSIS — Z85118 Personal history of other malignant neoplasm of bronchus and lung: Secondary | ICD-10-CM

## 2017-06-16 NOTE — Unmapped (Signed)
PCP: [name], MD    Dear Colleagues,     We had the pleasure of seeing [name] in the [facility] in consultation.  As you know the patient is a 67 y.o. year old female with  has a past medical history of Basal cell cancer; GERD (gastroesophageal reflux disease); Hyperlipidemia; Hypertension; IBS (irritable bowel syndrome); and OSA (obstructive sleep apnea). who presents with Follow-up  .         In summary, Joanna Lopez is a 67 y.o. female with a history of well controlled hypertension, hyperlipidemia and a slow-growing lung lesion that has been resected who presents for evaluation in stroke clinic after recent hospitalization for acute onset diplopia and metallic taste which persist.  Of note, she has had a recent episode of cardiac palpitations and cardiac pause that woke her out of sleep.  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 Brain MRI was read as negative for stroke (?tiny DWI+ lesion within midbrain along periaqueductal grey, near but not in the classic location for 4th nerve nucleus)  but she was found to have a subtle fusiform dilatation of the intradural left vertebral artery.  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  We would recommend / have instituted the following plans.     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   - cardiac event monitor while she awaits loop recorder (although this is most likely small vessel event  - continue to follow with [facility]  - continue aspirin / statin     # cardiac palpitations / pause  - referral to EP  -  cardiac event monitor while she awaits loop recorder (although this is most likely small vessel event      My telephone note -   Called pt - she feels that on 11/11 she had another stroke - she got severe headache, weird rubbery / numby feeling in back of her throat, unsteady, confused.  Came on suddenly while she was at church.  Headache was severe front top or right side, behind right eye, above right eye. HA has not disappeared. Now maxed out on excedrin migraine, with severe headache.    ??  After first stroke, taste sensation in center of tongue disappeared, and after this event, taste sensation on right lateral aspect of tongue now also gone.    ??  Eventually saw PCP who suggested she go to ED, where CT head was neg for stroke.  No MRI done.   ??  Now taking lots of liquids in order to swallows.    ??  Plan   - speech therapy / swallowing eval / tricks for safe swallowing  - repeat CTA head to ensure that she has not extended this fusiform dilatation.  - if CTA is neg for changes to dilatation, I plan to add plavix to her aspirin for 1-1mo (med faxed to pharmacy)  - MRI now would be unrevealing.   - encouraged her to call 911 for new stroke sx.s  - OK to continue to follow w Dr  Colapietro      Today's history 06/16/2017    Second event 11/11 - felt fatigued, terrible headache, diplopia worse, right side of tongue lost taste except metallic, and developed rubbery feeling in back of throat.  Headache has not resolved since 11/11.   Right eye pain.      Loop implanted 11/28    Difficulty sleeping.     Anxious    Husband w lewy body demenia     Loss of appetite    Diplopia is improving again    Memory issues.      Continues on aspirin. Clopidogrel is on her list, but she is unsure whether taking.        PMHx:   Past Medical History:   Diagnosis Date   ??? Basal cell cancer    ??? GERD (gastroesophageal reflux disease)    ??? Hyperlipidemia    ??? Hypertension    ??? IBS (irritable bowel syndrome)    ??? OSA (obstructive sleep  apnea)        PSHx:   Past Surgical History:   Procedure Laterality Date   ??? APPENDECTOMY     ??? COLONOSCOPY N/A 01/23/2013    Procedure: COLONOSCOPY WITH MAC;  Surgeon: Lurene Shadow, MD;  Location: [facility] ENDOSCOPY;  Service: Gastroenterology;  Laterality: N/A;   ??? FLEXIBLE BRONCHOSCOPY W/ UPPER ENDOSCOPY N/A 09/17/2016    Procedure: BRONCHOSCOPY SUPER D WITH FLUORO AND BIOPSY-EBUS/NAVI;  Surgeon: Johnny Bridge, MD;  Location: [facility] ENDOSCOPY;  Service: Pulmonary;  Laterality: N/A;   ??? LOOP IMPLANT IMPLANTATION N/A 04/07/2017    Procedure: Loop  Implantation;  Surgeon: Tennis Must, MD;  Location: [facility] ELECTROPHYSIOLOGY LAB;  Service: Electrophysiology;  Laterality: N/A;   ??? submastoid gland removal     ??? THORACOSCOPY Right 11/23/2016    Procedure: RIGHT SIDED VATS MIDDLE LOBE LOBECTOMY,HILAR MEDIASTINAL LYMPH NODE DISSECTION;  Surgeon: Janett Billow, MD;  Location: [facility] OR;  Service: Thoracic;  Laterality: Right;   ??? TONSILLECTOMY     ??? UVULOPALATOPHARYNGOPLASTY          Meds:   Outpatient Encounter Prescriptions as of 06/16/2017   Medication Sig Dispense Refill   ??? acetaminophen (TYLENOL) 325 MG tablet Take 3 tablets (975 mg total) by mouth every 6 hours as needed. 60 tablet 0   ??? aspirin 325 MG tablet Take 1 tablet (325 mg total) by mouth daily. 30 tablet 0   ??? aspirin-acetaminophen-caffeine (EXCEDRIN) 250-250-65 mg per tablet Take 2 tablets by mouth every 6 hours as needed for Pain.     ??? atorvastatin (LIPITOR) 80 MG tablet Take 1 tablet (80 mg total) by mouth at bedtime. 30 tablet 0   ??? clopidogrel (PLAVIX) 75 mg tablet Take 1 tablet (75 mg total) by mouth daily. 30 tablet 1   ??? dicyclomine (BENTYL) 20 mg tablet Take 20 mg by mouth 4 times a day with meals and at bedtime.     ??? fluticasone (FLONASE) 50 mcg/actuation nasal spray Use 2 sprays into each nostril if needed.     ??? hydroCHLOROthiazide (HYDRODIURIL) 25 MG tablet Take 1 tablet by mouth daily.     ??? lipase-protease-amylase (CREON) 24,000-76,000 -120,000 unit  CpDR Take 2 capsules po with each meal. (Patient taking differently: Take 2 capsules po with each meal.  Taking PRN Indications: exocrine pancreatic insufficiency         ) 180 capsule 6   ??? lisinopril (PRINIVIL,ZESTRIL) 20 MG tablet Take 10 mg by mouth daily. 10 mg (half tablet) daily     ???  melatonin 3 mg Tab Take 3 mg by mouth at bedtime. PRN for sleep     ??? multivitamin (MULTIVITAMIN) tablet Take 1 tablet by mouth daily.     ??? PARoxetine (PAXIL) 20 MG tablet Take 1 tablet by mouth daily.     ??? roPINIRole (REQUIP) 0.25 MG tablet Take 1 tablet (0.25 mg total) by mouth daily with dinner. Take one tablet (0.25 mg) 1-2 hours before bedtime, for three days, then take 2 tablets (0.5 mg) 1-2 hours before bedtime for four days, then take 4 tablets (1 mg) 1-2 hours before bedtime thereafter 95 tablet 5   ??? [DISCONTINUED] PARoxetine (PAXIL) 10 MG tablet Take 10 mg by mouth every morning.       No facility-administered encounter medications on file as of 06/16/2017.      Allergies: Penicillins; Formaldehyde analogues; Promethazine hcl; Latex; and Sulfa (sulfonamide antibiotics)  Social Hx:   Social History   Substance Use Topics   ??? Smoking status: Never Smoker   ??? Smokeless tobacco: Never Used   ??? Alcohol use Yes      Comment: 2-3 wine/week     FHx: family history includes Diabetes in her son. She was adopted.    ROS  System Denies /  Endorses    Constitutional fever, night sweats, weight changes, chills, fatigue   Skin rash, itching   Eyes vision loss, double vision, blurred vision   Heart chest pain, rapid heart beat   Lungs shortness of breath, wheezing   Gastrointestinal abdominal pain, vomiting, diarrhea, constipation, blood in stool   Genitourinary, kidney kidney stones, sexual dysfunction, blood in urine   Endocrine diabetes, thyroid disease   Hematologic/lymph anemia, bleeding, clotting problems, enlarged nodes   Musculoskeletal pain, weakness, loss of muscle   Psychiatric depression, anxiety   Neurological seizure,  syncope, memory loss, headache        VITAL SIGNS  BP 118/74 (BP Location: Left arm, Patient Position: Sitting, BP Cuff Size: Regular)    Pulse 87    Ht 5' 7 (1.702 m)    Wt 189 lb (85.7 kg)    BMI 29.60 kg/m??     GENERAL EXAM:  General: no acute distress  HEENT: mucous membranes moist  Heart: regular rate and rhythm, no murmurs   Lungs: easy respirations, clear to auscultation  Abdomen: soft  Extremities: no edema, good pulses    NEURO EXAM  Mental Status  Language: no aphasia  Atten/concentration: awake and alert   Orientation: oriented x3   Fund of Knowledge: grossly intact  Memory: Recent and remote memory intact    Cranial Nerves:  Pupils: equal, round, reactive to light and accomodation   Visual Fields: full to Teacher, early years/pre Acuity:  Grossly intact OU   CN 3,4,6 (EOM): persistent right CN 4 palsy.   Cranial Nerve 5 (Trigeminal): bilateral facial sensation normal   Cranial Nerve 7 (Facial): normal facial symmetry and strength   CN 8 (Auditory): hearing intact to finger rub bilaterally  CN 7,9/10,12: No dysarthria  Cranial Nerve 12 (Hypoglossal): tongue is midline on protrusion    Motor Exam:  Bulk and tone normal, There is no Pronator Drift, Fine finger movements and foot taps are brisk bilat   Sensation: Intact to light touch in arms and legs.    Coordination: Finger to nose  without ataxia; Negative Romberg.  Gait: Normal, narrow-based gait, Able to walk on heels/toes, slight difficulty w tandem.     STUDIES REVIEWED:     LABS:  Lab Results   Component Value Date    HGBA1C 5.5 02/19/2017     Lab Results   Component Value Date    CHOLTOT 174 02/22/2017    TRIG 249 (H) 02/22/2017    HDL 41 (L) 02/22/2017    LDL 83 02/22/2017     No results found for: TSH, T3TOTAL, T4TOTAL, THYROIDAB      IMAGING:       CTA Head/Neck (02/19/17):   ??  Minimal atherosclerotic disease of the aortic arch with no hemodynamically significant carotid or vertebral artery stenosis. No dissection or pseudoaneurysm is  seen.    Mild luminal irregularity of the left vertebral artery potentially due to atherosclerotic disease, with a subtle fusiform dilatation near foramen magnum. Otherwise normal CTA of the head with no evidence of intracranial stenosis, occlusive change, or AV shunting lesion.     MRI (02/20/17):   ??  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  ??  Tortuosity of the basilar artery causes mild displacement of the right 6th cranial nerve, of uncertain relation to clinical symptomatology. A variant of vascular compression syndrome cannot be excluded.     ECHO 02/22/2017  Study Conclusions    - Left ventricle: The cavity size was normal. Wall thickness was  ????normal. Systolic function was normal. The estimated ejection  ????fraction was in the range of 60% to 65%. Wall motion was normal;  ????there were no regional wall motion abnormalities.There was a  ????small, well circumscribed echodensity at the left ventricular  ????apex, of uncertain etiology. There are no accompanying wall motion  ????abnormalities to suggest an apical thrombus. Doppler parameters  ????are consistent with abnormal left ventricular relaxation (grade 1  ????diastolic dysfunction).  - Mitral valve: Mild regurgitation.  - Right ventricle: Systolic function was normal.  - Atrial septum: No defect or patent foramen ovale was identified.  - Pulmonic valve: Peak gradient (S): 4mm Hg.    Repeat TTE With Definity Contrast - Impressions: ??No evidence of apical thrombus on definity echo    CTA head/neck (04/10/17)-   1. Normal CT HEAD with and without contrast for age.  2. Negative CTA head and CTA neck.  3. Severe RIGHT C3-4 and LEFT C6-7 neural foraminal narrowing.    ASSESSMENT/PLAN:  In summary, Joanna Lopez is a 67 y.o. female with a history of well controlled hypertension, hyperlipidemia and a slow-growing lung lesion that has been resected who presents for evaluation in stroke clinic after recent  hospitalization for acute onset diplopia and metallic taste; she has had a second episode of worsening of diplopia and loss of taste. 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 Brain MRI was read as negative for stroke (?tiny DWI+ lesion within midbrain along periaqueductal grey, near but not in the classic location for 4th nerve nucleus)  but she was found to have a subtle fusiform dilatation of the intradural left vertebral artery.  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  We would recommend / have instituted the following plans.     # 4th nerve palsy / metallic taste in tongue with headache  - small vessel stroke vs leptomeningeal involvement of lung lesion, but her CSF cytology and flow were normal x1.  I am incraeseingly suspicious that this is not stroke.   - ordered PET scan to eval for recurrent malignancy.   - Pt will see Dr  Tenase - would appreciate thoughts on likelihood of malingancy as cause for these neurologic symptoms.    - continue aspirin / statin.  - she has picked up the plavix but not started it; ok to wait while PET pending.         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    - no treatment or follow up necessary    Followup:     After PET results, we would like Ms. Sooy to return to see Dr. Ladell Pier in the [facility].  Please do not hesitate to contact us with any questions.          Sincerely,   Ladell Pier MD, PhD  Attending, Vascular Neurology  Neuro-interventional Surgery  [facility]    Greater than half of this 50 minute visit was spent counseling the patient about treatment options, stroke risk factors and warning signs, stroke secondary prevention, and developing a plan of care in the event  that stroke-like symptoms should occur.

## 2017-06-16 NOTE — Unmapped (Signed)
In summary, Joanna Lopez is a 67 y.o. female with a history of well controlled hypertension, hyperlipidemia and a slow-growing lung lesion that has been resected who presents for evaluation in stroke clinic after recent hospitalization for acute onset diplopia and metallic taste; she has had a second episode of worsening of diplopia and loss of taste. 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 Brain MRI was read as negative for stroke (?tiny DWI+ lesion within midbrain along periaqueductal grey, near but not in the classic location for 4th nerve nucleus)  but she was found to have a subtle fusiform dilatation of the intradural left vertebral artery.  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  We would recommend / have instituted the following plans.     # 4th nerve palsy / metallic taste in tongue with headache  - small vessel stroke vs leptomeningeal involvement of lung lesion, but her CSF cytology and flow were normal x1.  I am incraeseingly suspicious that this is not stroke.   - ordered PET scan to eval for recurrent malignancy.   - Pt will see Dr Octavia Heir - would appreciate thoughts on likelihood of malingancy as cause for these neurologic symptoms.    - continue aspirin / statin.  - she has picked up the plavix but not started it; ok to wait while PET pending.         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    - no treatment or follow up necessary    Followup:     After PET results, we would like [name] to return to see Dr. Ladell Pier in the [facility].  Please do not hesitate to contact us with any questions.

## 2017-06-18 ENCOUNTER — Inpatient Hospital Stay: Admit: 2017-06-18 | Payer: MEDICARE | Attending: Pulmonary Disease

## 2017-06-18 DIAGNOSIS — Z08 Encounter for follow-up examination after completed treatment for malignant neoplasm: Secondary | ICD-10-CM

## 2017-06-21 NOTE — Unmapped (Signed)
Placed call to pt per Dr. Damaris Hippo   Left voicemail with result message below:    Let her know that CT chest is stable   Slightly more dense nodule on the left side, we will watch for now

## 2017-06-25 ENCOUNTER — Encounter: Payer: MEDICARE | Attending: Pulmonary Disease

## 2017-07-02 ENCOUNTER — Ambulatory Visit: Admit: 2017-07-02 | Discharge: 2017-07-02 | Payer: MEDICARE | Attending: Pulmonary Disease

## 2017-07-02 ENCOUNTER — Inpatient Hospital Stay: Admit: 2017-07-02 | Payer: MEDICARE | Attending: Neurology

## 2017-07-02 DIAGNOSIS — Z08 Encounter for follow-up examination after completed treatment for malignant neoplasm: Secondary | ICD-10-CM

## 2017-07-02 DIAGNOSIS — Z85118 Personal history of other malignant neoplasm of bronchus and lung: Secondary | ICD-10-CM

## 2017-07-02 DIAGNOSIS — R911 Solitary pulmonary nodule: Secondary | ICD-10-CM

## 2017-07-02 LAB — POC GLU MONITORING DEVICE: POC Glucose Monitoring Device: 91 mg/dL (ref 70–100)

## 2017-07-02 MED ORDER — F-18 FDG 10.36 millicurie
Freq: Once | Status: AC | PRN
Start: 2017-07-02 — End: 2017-07-02
  Administered 2017-07-02: 18:00:00 10.36 via INTRAVENOUS

## 2017-07-02 NOTE — Unmapped (Signed)
History of Present Illness:      Joanna Lopez is a 67 y.o. female.    HPI    I saw Joanna Lopez last time in October 2015  She had another CT chest done - was seen by her PCP who called me with results  She feels the same  No major changes since last time I saw he  Patient denies hemoptysis, increased shortness of breath, chest pains, headaches, nausea, vomiting, diarrhea  No fevers or chills. No new skin rashes    02/19/2017  Had lobectomy with dr Harriette Bouillon - typical carcinoid  Mild cough  No chest pains    07/02/2017  Stroke like events over the last few months  To have PETCT done today  No chest pains  Under lots of stress  Accompanied by her son  No headaches      Review of Systems   Constitutional: Positive for fatigue.   HENT: Positive for postnasal drip.    Respiratory: Positive for cough, shortness of breath and wheezing.    Gastrointestinal: Positive for bloating and diarrhea.   Musculoskeletal: Positive for arthralgias.   Neurological: Positive for tremors.   Hematological: Bruises/bleeds easily.   Psychiatric/Behavioral: The patient is nervous/anxious.    All other systems reviewed and are negative.      Objective:   Blood pressure 106/70, pulse 75, height 5' 6 (1.676 m), weight 191 lb (86.6 kg), SpO2 96 %.    Physical Exam    Nursing note and vitals reviewed.   Constitutional: Oriented to person, place, and time. Appears well-developed and well-nourished. No distress.   HENT: pupils equal, no scleral icterus, on conjunctivitis, no enlarged thyroid  Head: Normocephalic.    Ears: External ear normal.   Mouth/Throat: Oropharynx is clear and moist. No oropharyngeal exudate.   Eyes: Conjunctivae are normal. Pupils are equal, round, and reactive to light. Right eye exhibits no discharge. Left eye exhibits no discharge.   Neck: Normal range of motion. Neck supple. No JVD present. No tracheal deviation present. No thyromegaly present.   Cardiovascular: Normal rate, regular rhythm, normal heart  sounds and intact distal pulses. Exam reveals no gallop and no friction rub.   No murmur heard.   Pulmonary/Chest: Effort normal and breath sounds normal. No stridor. No respiratory distress. No wheezes. No rales. Exhibits no tenderness.   Abdominal:Exhibits no distension and no mass. There is no tenderness. There is no rebound and no guarding.   Musculoskeletal: exhibits no edema and no tenderness.   Neurological: alert and oriented to person, place, and time. normal reflexes. No cranial nerve deficit. Coordination normal.   Skin: Warm and dry. Not diaphoretic. No erythema.   Psychiatric: Normal mood and affect. Behavior is normal. Judgment and thought content normal.       Exam: CT CHEST WO CONTRAST dated 08/14/2016 10:17 AM EDT  ??  CLINICAL HISTORY: lung nodules;   ??  COMPARISON: February 07, 2014  ??  TECHNIQUE: Multidetector  CT imaging was obtained through the chest in the supine position without intravenous contrast. The images were reconstructed with 5 and 1 mm collimation using a 38 cm field of view.  ??  FINDINGS: The lung windows show that the central airways are patent. Small calcified nodules reflect healed granulomatous infection.  ??  The well-defined noncalcified nodule in the medial segment right middle lobe measures approximately 2.1 x 1.8 cm on series 5 image 214, previously 1.7 x 1.5 cm on February 07, 2014. The central portion of  this nodule is fluid attenuation.  ??  The irregular ill-defined subsolid nodule in the superior lingular subsegment of the left upper lobe on series 5 image 193 measures approximately 0.9 x 0.4 cm.  ??  A tiny subpleural nodule at the left apex laterally on series 5 image 73 is unchanged. There are no new lesions.  ??  The mediastinal windows show granulomatous calcification but otherwise no lymphadenopathy.  ??  No coronary artery calcifications are visible. The caliber of the great vessels is normal.  ??  Limited noncontrast imaging through the upper abdomen shows multiple  low-attenuation liver lesions, similar to prior studies, likely cysts. A fluid attenuation lesion centrally in the left kidney is larger compared to 2014 but likely a peripelvic cyst.  ??  The bone windows show no suspicious lesions.  ??  ??  IMPRESSION:  ??  While predominantly fluid attenuation, the right middle lobe nodule is mildly increased in size compared to 3 years previously. This is most likely a benign lesion such as granuloma or hamartoma. The fluid attenuation makes carcinoid tumor less likely. Nevertheless, due to the interval growth a follow-up CT in 12 months should be considered.  ??  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 This can be followed at the same time.  ??  Report Verified by: Rick Duff, M.D. at 08/14/2016 12:53 PM EDT    Assessment:     Lung nodules, carcinoid, s/p resection VATS RML  Lung nodule LUL  ??  Plan:   ??  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  Flu shot recommended every year  ??

## 2017-07-05 NOTE — Unmapped (Signed)
Relayed results of PET scan - they show the left pulmonary nodule which is essentally unchanged in size based on CT, which is metabolically active - second primary CA vs mets.   There is not an abundance of metastatic lesions that may have support the diagnosis of carcinomatous meningitis.       I told pt I would defer to Dr Roswell Nickel and Dr Octavia Heir whether they wished to pursue 3 high volume taps for CSF cytology in an effort to more-thoroughly evaluate for leptomeningeal involvement of CA.     Unlikely that cranial neuropathies are from strokes.     Ladell Pier MD, PhD  Vascular Neurology and   NeuroInterventional Surgery  07/05/2017 4:57 PM

## 2017-07-06 ENCOUNTER — Ambulatory Visit: Admit: 2017-07-06 | Discharge: 2017-07-06 | Payer: MEDICARE | Attending: Cardiovascular Disease

## 2017-07-06 DIAGNOSIS — Z4509 Encounter for adjustment and management of other cardiac device: Secondary | ICD-10-CM

## 2017-07-06 NOTE — Unmapped (Signed)
Follow up in one year.    There are no changes in your medications at this time.    Please call with any questions or concerns at [phone].

## 2017-07-06 NOTE — Unmapped (Signed)
Subjective:       Patient ID: Joanna Lopez is a 67 y.o. female.    Chief Complaint: follow up post ILR implant.    HPI   Office visit 03/22/17:  This is a 67 y.o. female??with HTN, OSA, lung Ca s/p R middle lobectomy, recent hospital admission 10/12 - 02/24/17 for diplopia and noted to have ataxia while in the ER which resolved. She had extensive work up done including MRI brain, lumbar puncture, TTE. There was concern for small apical LV thrombus on initial echocardiogram, however, on repeat echo with contrast there was no evidence of LV thrombus. She is referred to me to discuss long term cardiac rhythm monitoring to assess for AF as potential cause for TIA. She has no prior h/o AF. She had 2 episodes of palpitations lasting few min over the last month. These happened during night and resolved quickly. No dizziness or syncope.    Office visit 07/06/17:  Underwent ILR implant on 04/07/17. No issues at implant site. Denies palpitations, dizziness, syncope.       Histories:   She has a past medical history of Basal cell cancer; GERD (gastroesophageal reflux disease); Hyperlipidemia; Hypertension; IBS (irritable bowel syndrome); and OSA (obstructive sleep apnea).    She has a past surgical history that includes Appendectomy; submastoid gland removal; Uvulopalatopharyngoplasty; Tonsillectomy; Colonoscopy (N/A, 01/23/2013); Flexible bronchoscopy w/ upper endoscopy (N/A, 09/17/2016); Thoracoscopy (Right, 11/23/2016); and Loop Implant  Implantation (N/A, 04/07/2017).    Her family history includes Diabetes in her son. She was adopted.    She reports that she has never smoked. She has never used smokeless tobacco. She reports that she drinks alcohol. She reports that she does not use drugs.    ROS:   ROS  General: Denies fever, or weight loss.  HEENT: No changes in hearing or vision.  Cardiovascular: As per HPI  Respiratory: Negative for sputum production, wheezing.  GI: Negative for nausea, vomiting, diarrhea.  GU:  Denies dysuria, hematuria.  Musculoskeletal: Denies joint swelling   Skin: Denies easy bruising, denies rashes or lesions   Psych: Denies episodes of anxiety, denies depression   Hematology/Lymphology: Negative for prolonged bleeding, bruising, or swollen nodes.  Endocrine: Negative for history of thyroid disease.  Neurologic: Negative for loss of consciousness.    Allergies:   Penicillins; Formaldehyde analogues; Promethazine hcl; Latex; and Sulfa (sulfonamide antibiotics)    Medications:     Outpatient Encounter Prescriptions as of 07/06/2017   Medication Sig Dispense Refill   ??? acetaminophen (TYLENOL) 325 MG tablet Take 3 tablets (975 mg total) by mouth every 6 hours as needed. 60 tablet 0   ??? aspirin 325 MG tablet Take 1 tablet (325 mg total) by mouth daily. 30 tablet 0   ??? atorvastatin (LIPITOR) 80 MG tablet Take 1 tablet (80 mg total) by mouth at bedtime. 30 tablet 0   ??? clopidogrel (PLAVIX) 75 mg tablet Take 1 tablet (75 mg total) by mouth daily. 30 tablet 1   ??? dicyclomine (BENTYL) 20 mg tablet Take 20 mg by mouth 4 times a day with meals and at bedtime.     ??? fluticasone (FLONASE) 50 mcg/actuation nasal spray Use 2 sprays into each nostril if needed.     ??? hydroCHLOROthiazide (HYDRODIURIL) 25 MG tablet Take 1 tablet by mouth daily.     ??? lipase-protease-amylase (CREON) 24,000-76,000 -120,000 unit CpDR Take 2 capsules po with each meal. (Patient taking differently: Take 2 capsules po with each meal.  Taking PRN Indications: exocrine  pancreatic insufficiency         ) 180 capsule 6   ??? lisinopril (PRINIVIL,ZESTRIL) 20 MG tablet Take 10 mg by mouth daily. 10 mg (half tablet) daily     ??? melatonin 3 mg Tab Take 3 mg by mouth at bedtime. PRN for sleep     ??? multivitamin (MULTIVITAMIN) tablet Take 1 tablet by mouth daily.     ??? PARoxetine (PAXIL) 20 MG tablet Take 1 tablet by mouth daily.     ??? roPINIRole (REQUIP) 0.25 MG tablet Take 1 tablet (0.25 mg total) by mouth daily with dinner. Take one tablet (0.25 mg)  1-2 hours before bedtime, for three days, then take 2 tablets (0.5 mg) 1-2 hours before bedtime for four days, then take 4 tablets (1 mg) 1-2 hours before bedtime thereafter 95 tablet 5   ??? aspirin-acetaminophen-caffeine (EXCEDRIN) 250-250-65 mg per tablet Take 2 tablets by mouth every 6 hours as needed for Pain.       No facility-administered encounter medications on file as of 07/06/2017.         Objective:       Blood pressure 106/79, pulse 87, height 5' 6 (1.676 m), weight 189 lb 14.4 oz (86.1 kg), SpO2 97 %.    Physical Exam  General: Well-appearing, in no acute distress.  HEENT: Normocephalic, atraumatic.   Neck: Supple. No jugular venous distention.  Respiratory: Clear to auscultation.   Cardiovascular: Normal S1 and S2. Regular.   Gastrointestinal: Soft, nondistended.   Extremities: Without clubbing, cyanosis, or edema.   Neurologic: Alert and oriented x3. Speech is clear and fluent.   Skin:  Warm to touch, no rashes, lumps, or lesions.  MS: No joint swelling in all extremities.    Lab Review:     CBC:    Lab Results   Component Value Date    HGB 13.2 03/30/2017    HGB 13.2 11/23/2016    HCT 39.5 03/30/2017    HCT 40.5 11/23/2016    PLT 183 03/30/2017    WBC 9.9 03/30/2017    MCV 91.4 03/30/2017    MCH 30.7 03/30/2017       RENAL:    Lab Results   Component Value Date    NA 139 03/30/2017    K 4.1 03/30/2017    CL 106 03/30/2017    CO2 25 03/30/2017    BUN 38 (H) 03/30/2017    CREATININE 1.50 (H) 03/30/2017    GLUCOSE 95 03/30/2017    PHOS 3.5 11/26/2016    ALBUMIN 3.4 (L) 02/22/2017    CALCIUM 9.2 03/30/2017       LIVER:    Lab Results   Component Value Date    AST 11 (L) 12/29/2016    ALT 14 12/29/2016    BILITOT 0.8 12/29/2016    ALBUMIN 3.4 (L) 02/22/2017    ALKPHOS 69 12/29/2016       LIPIDS:    Lab Results   Component Value Date    CHOLTOT 174 02/22/2017    LDL 83 02/22/2017    HDL 41 (L) 02/22/2017    TRIG 249 (H) 02/22/2017         Review of Test Results:     Echocardiogram    Recent Results (from  the past 8760 hours)   ECHOCARDIOGRAM COMPLETE WITH BUBBLE STUDY    Narrative                            *  [facility]                       Department of Cardiology*                         [address]                         [place], [place] [address]                              [phone]    Transthoracic Echocardiography    (Report amended )    Patient:    Patrici, Minnis  MR #:       [id number]  Account:  Study Date: 02/22/2017  Gender:     F  Age:        63  DOB:        May 11, 1951  Room:       [facility]     CONSULTING  Kleindorfer, Dawn   ADMITTING   Katneni, Ranjit   PERFORMING  [facility], Mca   ATTENDING   [place], Hema Kumari   ORDERING    [place], Emily Rachael   REFERRING   Naranja, Irving Burton Rachael   REFERRING   [place]   OPERATOR    [place], Melissa    --------------------------------------------------------------------    Procedure:ECHO 2D         Order:ECHO 2D         Accession  COMPLETE W/BUBBLE STUDY   COMPLETE W/BUBBLE     Number:[id number]                            STUDY    --------------------------------------------------------------------  Indications:      G45.9 TIA.    --------------------------------------------------------------------  PMH:  Hyperlipidemia, GERD, OSA.  Risk factors:  Hypertension.     --------------------------------------------------------------------  Study data:   Study status:  Routine.  Procedure:  Transthoracic  echocardiography. Image quality was good. Scanning was performed  from the parasternal, apical, and subcostal acoustic windows.  Intravenous contrast (agitated saline) was administered.  Transthoracic echocardiography.  M-mode, complete 2D, complete  spectral Doppler, and color Doppler.  Birthdate:  Patient birthdate:  08-09-1950.  Age:  Patient is 67yr old.  Sex:  Gender: female.  Height:  Height: 67in. Height: 67in.  Weight:  Weight: 224.5lb.  Weight: 224.6lb.  Body mass index:  BMI: 35.2kg/m^2.  Body surface  area:    BSA: 2.48m^2.  Blood  pressure:     150/88  Patient status:  Inpatient.  Study date:  Study date: 02/22/2017. Study time: 10:30  AM.  Location:  Echo laboratory.    --------------------------------------------------------------------  Study Conclusions    - Left ventricle: The cavity size was normal. Wall thickness was    normal. Systolic function was normal. The estimated ejection    fraction was in the range of 60% to 65%. Wall motion was normal;    there were no regional wall motion abnormalities.There was a    small, well circumscribed echodensity at the left ventricular    apex, of uncertain etiology. There are no accompanying wall motion    abnormalities to suggest an apical thrombus. Doppler parameters    are consistent with abnormal left ventricular relaxation (grade 1    diastolic dysfunction).  - Mitral valve: Mild  regurgitation.  - Right ventricle: Systolic function was normal.  - Atrial septum: No defect or patent foramen ovale was identified.  - Pulmonic valve: Peak gradient (S): 4mm Hg.    Recommendations:  This procedure has been discussed with the  referring physician, Dr. Bufford Spikes.    --------------------------------------------------------------------  Cardiac Anatomy    Left ventricle:    - The cavity size was normal. Wall thickness was normal. Systolic    function was normal. The estimated ejection fraction was in the    range of 60% to 65%. Wall motion was normal; there were no    regional wall motion abnormalities.There was a small, well    circumscribed echodensity at the left ventricular apex, of    uncertain etiology. There are no accompanying wall motion    abnormalities to suggest an apical thrombus.    - Doppler parameters are consistent with abnormal left ventricular    relaxation (grade 1 diastolic dysfunction).    Aortic valve:    - Trileaflet; normal thickness leaflets. Mobility was not    restricted.    Doppler:    - Transvalvular velocity was within the normal range. There was no    stenosis. No  regurgitation.    - Peak velocity ratio of LVOT to aortic valve: 0.73.    - Mean gradient (S): 4mm Hg. Peak gradient (S): 7mm Hg.    Aorta:   Aortic root:    - The aortic root was normal in size.    Mitral valve:    - Structurally normal valve. Mobility was not restricted.    Doppler:    - Transvalvular velocity was within the normal range. There was no    evidence for stenosis. Mild regurgitation.    - Mean gradient (D): 1mm Hg. Peak gradient (D): 4mm Hg.    Left atrium:    - The atrium was normal in size.    Atrial septum:    - No defect or patent foramen ovale was identified.    Right ventricle:    - The cavity size was normal. Wall thickness was normal. Systolic    function was normal. Systolic pressure was within the normal    range.    Pulmonic valve:    - Poorly visualized.    Doppler:    - Transvalvular velocity was within the normal range. There was no    evidence for stenosis. No regurgitation.    - Mean gradient (S): 2mm Hg. Peak gradient (S): 4mm Hg.    Tricuspid valve:    - Structurally normal valve.    Doppler:    - Transvalvular velocity was within the normal range. Mild    regurgitation.    Pulmonary artery:    - Systolic pressure was within the normal range.    Right atrium:    - The atrium was normal in size.    Pericardium:    - There was no pericardial effusion.    Systemic veins:  Inferior vena cava:    - The vessel was normal in size. The respirophasic diameter changes    were in the normal range (= 50%), consistent with normal central    venous pressure.    --------------------------------------------------------------------  Measurements     Left ventricle                               Value        Reference   LV  ID, ED, PLAX                      (N)     4.0   cm     3.9 - 5.3   LV ID, ES, PLAX                              3.0   cm     ---------   LV fx shortening, PLAX               (L)     24    %      27 - 45   LV mid-wall fx shortening, PLAX      (L)     11    %      15 - 23   LV fx  shortening, PLAX chordal       (L)     25    %      27 - 45   LV PW thickness, ED, PLAX            (N)     0.9   cm     0.6 - 0.9   IVS/LV PW ratio, ED, PLAX                    1.06         ---------   LV end-diastolic volume              (N)     62    ml     56 - 104   LV end-systolic volume               (N)     27    ml     19 - 49   LV ejection fraction                 (N)     57    %      >=55   LV end-diastolic volume/bsa          (L)     28    ml/m^2 35 - 75   LV end-systolic volume/bsa           (N)     12    ml/m^2 12 - 30   LV e', lateral                               0.102 m/sec  ---------   LV E/e', lateral                             8            ---------   LV e', medial                                0.076 m/sec  ---------   LV E/e', medial                              11           ---------   LV e', average  0.089 m/sec  ---------   LV E/e', average                             9            ---------      Ventricular septum                           Value        Reference   IVS thickness, ED, PLAX              (H)     1.0   cm     0.6 - 0.9      LVOT                                         Value        Reference   LVOT peak velocity, S                        0.98  m/sec  ---------      Aortic valve                                 Value        Reference   Aortic valve peak velocity, S                1.3   m/sec  ---------   Aortic mean gradient, S                      4     mm Hg  ---------   Aortic peak gradient, S                      7     mm Hg  ---------   Velocity ratio, peak, LVOT/AV                0.73         ---------      Aorta                                        Value        Reference   Aortic root ID, ED                   (N)     3.3   cm     <4.3      Left atrium                                  Value        Reference   LA ID, A-P, ES                       (N)     3.3   cm     2.7 - 3.8   LA ID/bsa, A-P                       (  N)     1.5   cm/m^2 1.5 - 2.3       Mitral valve                                 Value        Reference   Mitral E-wave peak velocity                  0.82  m/sec  ---------   Mitral A-wave peak velocity                  1.01  m/sec  ---------   Mitral mean velocity, D                      0.52  m/sec  ---------   Mitral deceleration time                     264   ms     ---------   Mitral mean gradient, D                      1     mm Hg  ---------   Mitral peak gradient, D                      4     mm Hg  ---------   Mitral E/A ratio, peak                       0.8          ---------   Mitral A-wave VTI                            29.9  cm     ---------      Tricuspid valve                              Value        Reference   Tricuspid regurg peak velocity               2.6   m/sec  ---------   Tricuspid peak RV-RA gradient                26    mm Hg  ---------   Tricuspid maximal regurg velocity,           2.51  m/sec  ---------   PISA      Right ventricle                              Value        Reference   RV ID, ED, PLAX                              3.8   cm     ---------      Pulmonic valve                               Value        Reference   Pulmonic valve peak velocity, S  1     m/sec  ---------   Pulmonic valve mean velocity, S              0.68  m/sec  ---------   Pulmonic valve VTI, S                        267.0 cm     ---------   Pulmonic mean gradient, S                    2     mm Hg  ---------   Pulmonic peak gradient, S                    4     mm Hg  ---------    Legend:  (L)  and  (H)  mark values outside specified reference range.    (N)  marks values inside specified reference range.    Danella Maiers, MD  [id number]     Signed by: Eda Keys, MD        Contrast echo (02/23/17):  Impressions: ??No evidence of apical thrombus on definity echo    ECG (03/22/17): normal sinus rhythm at 79 bpm (personally reviewed).    [employer] ILR interrogation performed in the office 07/06/17: DOI 04/07/17,  sinus rhythm, R waves 0.2 mV, no AF. (see media section for scanned interrogation report).              Assessment:   TIA.  Palpitations.  ILR implant 04/07/17.    Plan:   ILR site appears healthy.  No AF on interrogation.  On remote monitoring.  Follow up in a year.

## 2017-07-08 ENCOUNTER — Inpatient Hospital Stay: Admit: 2017-07-08 | Payer: MEDICARE | Attending: Cardiovascular Disease

## 2017-07-08 DIAGNOSIS — I639 Cerebral infarction, unspecified: Secondary | ICD-10-CM

## 2017-07-08 NOTE — Unmapped (Signed)
Device interrogation reviewed.  ILR interrogation shows sinus rhythm.  No AF.

## 2017-07-15 ENCOUNTER — Ambulatory Visit: Admit: 2017-07-15 | Discharge: 2017-07-15 | Payer: MEDICARE

## 2017-07-15 DIAGNOSIS — R911 Solitary pulmonary nodule: Secondary | ICD-10-CM

## 2017-07-15 NOTE — Unmapped (Signed)
Chief complaint:  Lung nodule    HPI:   Patient ID: Joanna Lopez is a 67 y.o. female.    HPI:  Joanna Lopez is a 66y/o female who was referred back for evaluation of a left upper lobe lung nodule.  She had undergone a right VATS middle lobectomy on 11/23/16 for a T1bN0M0, stage IA2, typical carcinoid tumor.  She has been following up with Dr. Damaris Hippo.  A recent Chest CT revealed an 8mm left upper lobe nodule that has slowly increased in density since 2014.  She underwent a CTPET which revealed an SUV of 6.4 in the nodule, previously 1.4.      Of note, she was hospitalized in October with visual disturbance and headache. She had an extensive work up which was unremarkable.  She otherwise feels well. She has mild dyspnea with exertion.     Performance Status:Symptomatic - fully ambulatory       Past Medical History:   Diagnosis Date   ??? Basal cell cancer    ??? GERD (gastroesophageal reflux disease)    ??? Hyperlipidemia    ??? Hypertension    ??? IBS (irritable bowel syndrome)    ??? OSA (obstructive sleep apnea)      Past Surgical History:   Procedure Laterality Date   ??? APPENDECTOMY     ??? COLONOSCOPY N/A 01/23/2013    Procedure: COLONOSCOPY WITH MAC;  Surgeon: Lurene Shadow, MD;  Location: [facility] ENDOSCOPY;  Service: Gastroenterology;  Laterality: N/A;   ??? FLEXIBLE BRONCHOSCOPY W/ UPPER ENDOSCOPY N/A 09/17/2016    Procedure: BRONCHOSCOPY SUPER D WITH FLUORO AND BIOPSY-EBUS/NAVI;  Surgeon: Johnny Bridge, MD;  Location: [facility] ENDOSCOPY;  Service: Pulmonary;  Laterality: N/A;   ??? LOOP IMPLANT IMPLANTATION N/A 04/07/2017    Procedure: Loop  Implantation;  Surgeon: Tennis Must, MD;  Location: [facility] ELECTROPHYSIOLOGY LAB;  Service: Electrophysiology;  Laterality: N/A;   ??? submastoid gland removal     ??? THORACOSCOPY Right 11/23/2016    Procedure: RIGHT SIDED VATS MIDDLE LOBE LOBECTOMY,HILAR MEDIASTINAL LYMPH NODE DISSECTION;  Surgeon: Janett Billow, MD;  Location: [facility] OR;  Service: Thoracic;  Laterality: Right;   ???  TONSILLECTOMY     ??? UVULOPALATOPHARYNGOPLASTY       Family History   Problem Relation Age of Onset   ??? Adopted: Yes   ??? Diabetes Son      Social History   Substance Use Topics   ??? Smoking status: Never Smoker   ??? Smokeless tobacco: Never Used   ??? Alcohol use Yes      Comment: 2-3 wine/week     Allergies   Allergen Reactions   ??? Penicillins Anaphylaxis     Other reaction(s): Facial Swelling  Has patient had a PCN reaction causing immediate rash, facial/tongue/throat swelling, SOB or lightheadedness with hypotension: Yes  Has patient had a PCN reaction causing severe rash involving mucus membranes or skin necrosis: Yes  Has patient had a PCN reaction that required hospitalization: No  Has patient had a PCN reaction occurring within the last 10 years: No  If all of the above answers are NO, then may proceed with Cephalosporin use.   ??? Formaldehyde Analogues    ??? Promethazine Hcl      Restless legs   ??? Latex Rash     States she has a sensitivity to latex after being around it for days, she gets a rash: she states that when she wore her cpap machine for 14 days she developed a rash on her  face other than this she has no problems stating her dentist wears latex gloves and she has no problems with latex gloves.    ??? Sulfa (Sulfonamide Antibiotics) Rash     Current Outpatient Prescriptions   Medication Sig   ??? aspirin Take 1 tablet (325 mg total) by mouth daily.   ??? aspirin-acetaminophen-caffeine Take 2 tablets by mouth every 6 hours as needed for Pain.   ??? atorvastatin Take 1 tablet (80 mg total) by mouth at bedtime.   ??? dicyclomine Take 20 mg by mouth 4 times a day with meals and at bedtime.   ??? hydroCHLOROthiazide Take 1 tablet by mouth daily.   ??? lipase-protease-amylase Take 2 capsules po with each meal. (Patient taking differently: Take 2 capsules po with each meal.  Taking PRN Indications: exocrine pancreatic insufficiency         )   ??? lisinopril Take 10 mg by mouth daily. 10 mg (half tablet) daily   ??? melatonin  Take 3 mg by mouth at bedtime. PRN for sleep   ??? multivitamin Take 1 tablet by mouth daily.   ??? PARoxetine Take 1 tablet by mouth daily.   ??? roPINIRole Take 1 tablet (0.25 mg total) by mouth daily with dinner. Take one tablet (0.25 mg) 1-2 hours before bedtime, for three days, then take 2 tablets (0.5 mg) 1-2 hours before bedtime for four days, then take 4 tablets (1 mg) 1-2 hours before bedtime thereafter   ??? acetaminophen Take 3 tablets (975 mg total) by mouth every 6 hours as needed.   ??? fluticasone propionate Use 2 sprays into each nostril if needed.     No current facility-administered medications for this visit.      Cancer Staging:   Cancer Staging  Malignant neoplasm of middle lobe of right lung (CMS Dx)  Staging form: Lung, AJCC 8th Edition  - Clinical: No stage assigned - Unsigned  - Pathologic stage from 12/11/2016: Stage IA2 (pT1b, pN0, cM0) - Signed by Janett Billow, MD on 12/11/2016      ROS:   Review of Systems   Constitutional: Positive for fatigue. Negative for appetite change, chills and diaphoresis.   HENT: Negative for congestion, dental problem, drooling, hearing loss, mouth sores, postnasal drip, rhinorrhea, sneezing, sore throat, tinnitus, trouble swallowing and voice change.    Eyes: Negative for photophobia, pain, discharge, redness and visual disturbance.   Respiratory: Positive for cough. Negative for chest tightness and stridor.    Cardiovascular: Negative for chest pain and leg swelling.   Gastrointestinal: Negative for abdominal pain, anal bleeding, blood in stool, constipation, nausea and vomiting.   Musculoskeletal: Negative for arthralgias, gait problem, neck pain and neck stiffness.   Skin: Negative for wound.   Neurological: Positive for light-headedness and headaches. Negative for tremors, speech difficulty and numbness.   Hematological: Negative for adenopathy.   Psychiatric/Behavioral: Negative for behavioral problems, decreased concentration, dysphoric mood, hallucinations and  sleep disturbance. The patient is nervous/anxious. The patient is not hyperactive.    All other systems reviewed and are negative.  I have reviewed the ROS and agree with the findings documented above.       BP 110/76 (BP Location: Right arm, Patient Position: Sitting)    Pulse 71    Resp 14    Ht 5' 6 (1.676 m)    Wt 189 lb (85.7 kg)    SpO2 97%    BMI 30.51 kg/m??     Objective:   Physical Exam   Constitutional:  She is oriented to person, place, and time. She appears well-developed and well-nourished. No distress.   HENT:   Head: Normocephalic and atraumatic.   Neck: Neck supple.   Cardiovascular: Normal rate and regular rhythm.    Pulmonary/Chest: No respiratory distress. She has no wheezes. She has no rales.   Neurological: She is alert and oriented to person, place, and time. Coordination normal.   Skin: Skin is warm and dry.   Psychiatric: She has a normal mood and affect. Her behavior is normal.          Imaging:     PET CT TUMOR IMG SKULL TO MID-THIGH-PS dated 07/02/2017 12:26 PM EST    IMPRESSION: Since PET/CT  August 28, 2016    1.  Increased metabolic activity of the left upper lobe pulmonary nodule consistent with malignancy, primary lung cancer versus metastasis.  2. Patient is status post right middle lobectomy. Linear FDG uptake along the staple line, probably chronic inflammation. Attention on follow-up.   ??     CT CHEST WO CONTRAST dated 06/18/2017 1:23 PM EST  ??  IMPRESSION:  ??  Postsurgical changes from right middle lobectomy.  ??  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 Continue follow-up.   ??    PFT:   Pulmonary function test     Component 10/15/16 11:10 AM   FEV1 2.92    FEV1% 113    FVC 3.58    FVC% 106    FEV1/FVC 82    FEV1/FVC EXP 77    RESPONSE TO BRONCHODILATOR -1% FVC, -3% FEV1    TLC 5.74    TLC% 105    RV 2.04    RV% 97    VC 3.70    VC% 110    DLCO 17.52     DLCO% 73              Pathology:   CASE: [id number]  PATIENT: Liora Fontanez    FINAL DIAGNOSIS:    A. ?? Lymph node, level 10, excision:  ???? ?? - One benign lymph node with reactive change (0/1).  ???? ?? - Negative for atypia or malignancy.    B. ?? Lymph node, level 11, excision:  ???? ?? - Two benign lymph nodes with reactive change (0/2).  ???? ?? - Negative for atypia or malignancy.    C. ?? Lung, right middle lobe, lobectomy:  - Well differentiated neuroendocrine tumor, Grade 1 (Typical carcinoid  tumor; 2 cm greatest dimension).  -Pathologic tumor stage: ??pT1b, pN0, pMX.  - Ki-67 index is 2%; mitosis is less than 2 per two millimeter square.  - No evidence of necrosis.  ???? ?? ?? ?? ??- Margin of resection is free of tumor.  ???? ?? ?? ?? ??- Please see Synoptic Report.  ???? ?? - Background lung parenchyma shows emphysematous change.  ???? ?? - Six benign lymph nodes with reactive change (0/6).    D. ?? Lymph node, level 7, excision:  ???? ?? - Two benign lymph nodes with reactive change (0/2).  ???? ?? - Negative for metastatic disease.    E. ?? Lymph node, level 4R, excision:  ???? ?? - One benign lymph node with reactive change(0/1).  ???? ?? - Negative for metastatic disease.      SYNOPTIC REPORT  Procedure  Lobectomy  Specimen Laterality  Right  Tumor Site  Right middle  lobe  Tumor Size  Greatest dimension (centimeters): ??2 x 1.5 x 1 cm  Tumor Focality  Single focus  Histologic Type  Typical carcinoid tumor  Histologic Grade  G1: Well differentiated  Spread Through Air Spaces (STAS)  Not identified  Visceral Pleura Invasion  Not identified  Lymphovascular Invasion  Not identified  Direct Invasion of Adjacent Structures  No adjacent structures present  Margins  All margins are uninvolved by tumor  ???? ?? Margins examined (specify): ??Bronchial  ???? ?? Distance of invasive carcinoma from closest margin (centimeters): ??1.3  cm  Treatment Effect  No known presurgical therapy  Regional Lymph Nodes  Lymph Node Examination  Number of Lymph Nodes  Involved: ??0  Number of Lymph Nodes Examined: ??12 as follows: ??Level 10 - 0/1, level 11 -  0/2, level 7 - 0/2, level 4R - 0/1 and peribronchial - 0/6.  Pathologic Stage Classification (pTNM, AJCC 8th Edition)  Primary Tumor (pT)  pT1b: ?? ?? ?? ?? ??Tumor >1 cm, but less than or equal to 2 cm in greatest  dimension  Regional Lymph Nodes (pN)  pN0: No regional lymph node metastasis  Distant Metastasis (pM)  pMX  Additional Pathologic Findings  Emphysematous change  Ancillary Studies  Tumor cells are positive for synaptophysin and chromogranin and show a  Ki-67 index is 2%, findings support typical carcinoid tumor. ??There is no  evidence of necrosis or increased mitosis or atypia.    Comment(s): ??Patient's history of enlarging right middle lobe nodule is  noted. The sections show well ciecumscribed neuroendocrine tumor  (synaptophysin and chromogranin positive ) with less than 2 mitosis per 2  millimeter square, ki-67 index of 2% and does not show areas of necrosis.  The histologic and immunphenotypic features are of typical carcinoid tumor  (Grade1).     Treatment:   Right VATS middle lobectomy 11/23/16     Assessment/Plan:   Ms. Heater returns following a right VATS middle lobectomy for a T1bN0M0, stage IA2, typical carcinoid tumor on 11/23/16. She was referred back for a left upper lobe nodule, that on recent imaging demonstrated increased PET avidity.  I have reviewed her imaging with our chest radiologists and the nodule has slowly become more dense since 2014, but is similar in size. I'm not sure what to make about the increase in PET uptake. 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 It is too small to biopsy and too deep for a wedge resection. I discussed 2 options with her. The first is to proceed with resection, which can likely be done with a lingular segmentectomy. I also discussed continued surveillance given the slow change.  She wishes to continue surveillance and will follow up with Dr.  Damaris Hippo for a repeat surveillance CT. I would be happy to see her back any time in the future.     Cc:  [name], MD - PCP  Rhea Bleacher, MD - pulmonary

## 2017-07-16 ENCOUNTER — Ambulatory Visit: Admit: 2017-07-16 | Discharge: 2017-07-16 | Payer: MEDICARE

## 2017-07-16 DIAGNOSIS — I639 Cerebral infarction, unspecified: Secondary | ICD-10-CM

## 2017-07-16 NOTE — Unmapped (Signed)
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 year old RH W  female who  has a past medical history of Basal cell cancer; GERD (gastroesophageal reflux disease); Hyperlipidemia; Hypertension; IBS (irritable bowel syndrome); and OSA (obstructive sleep apnea). 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   She has been seen by Joanna Lopez, tx care due to location, I have cared for her husband w severe/ advanced DLB     REF Joanna Closs, MD  PCP Joanna Fountain, MD    NPV date w me : 07/16/17   Patient was seen alone   She is here for first time visit w me ; she is tx care from Joanna Lopez   She has recovered well except dec taste in 1 spot of the tongue and rubbery  feeling at her throat; she had 4th nerve palsy on R and diplopia when she is stressed and fatigue   ophto - no report available, will try to get records   She has a hx lung lesion/ nodule that is follow-up by pulmonology   She was evaluated for CNS malignancy and so far w/u neg     Joanna Lopez is a 67 y.o. female with a history of well controlled hypertension, hyperlipidemia and a slow-growing lung lesion that has been resected who presents for evaluation in stroke clinic after recent hospitalization for acute onset diplopia and metallic taste which persist.  Of note, she has had a recent episode of cardiac palpitations and cardiac pause that woke her out of sleep.  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 Brain MRI was read as negative for stroke (?tiny DWI+ lesion within midbrain along periaqueductal grey, near but not in the classic location for 4th nerve nucleus)  but she was found to have a subtle fusiform dilatation of the intradural left vertebral artery.  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  We would recommend / have instituted the following plans.   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   - cardiac event monitor while she awaits loop recorder (although this is most likely small vessel event  - continue to follow with [facility]  - continue aspirin / statin  # cardiac palpitations / pause  - referral to EP  - cardiac event monitor while she awaits loop recorder (although this is most likely small vessel event   telephone note -   Called pt - she feels that on 11/11 she had another stroke - she got severe headache, weird rubbery / numby feeling in back of her throat, unsteady, confused.  Came on suddenly while she was at church.  Headache was severe front top or right side, behind right eye, above right eye. HA has not disappeared. Now maxed out on excedrin migraine, with severe headache.    After first stroke, taste sensation in center of tongue disappeared, and after  this event, taste sensation on right lateral aspect of tongue now also gone.    Eventually saw PCP who suggested she go to ED, where CT head was neg for stroke.  No MRI done.   Now taking lots of liquids in order to swallows.    Plan   - speech therapy / swallowing eval / tricks for safe swallowing  - repeat CTA head to ensure that she has not extended this fusiform dilatation.  - if CTA is neg for changes to dilatation, I plan to add plavix to her aspirin for 1-12mo (med faxed to pharmacy)  - MRI now would be unrevealing.   - encouraged her to call 911 for new stroke sx.s  - OK to continue to follow w Joanna Lopez  Today's history 2/6/201  Second event 11/11 - felt fatigued, terrible headache, diplopia worse, right side of  tongue lost taste except metallic, and developed rubbery feeling in back of throat.  Headache has not resolved since 11/11.   Right eye pain.   Loop implanted 11/2  Difficulty sleeping.   Anxious  Husband w lewy body demenia   Loss of appetite  Diplopia is improving again  Memory issues.    Continues on aspirin. Clopidogrel is on her list, but she is unsure whether taking.      Medications:   Current Outpatient Prescriptions:  acetaminophen, Take 3 tablets (975 mg total) by mouth every 6 hours as needed.  aspirin, Take 1 tablet (325 mg total) by mouth daily.  aspirin-acetaminophen-caffeine, Take 2 tablets by mouth every 6 hours as needed for Pain.  atorvastatin, Take 1 tablet (80 mg total) by mouth at bedtime.  dicyclomine, Take 20 mg by mouth 4 times a day with meals and at bedtime.  fluticasone propionate, Use 2 sprays into each nostril if needed.  hydroCHLOROthiazide, Take 1 tablet by mouth daily.  lipase-protease-amylase, Take 2 capsules po with each meal. (Patient taking differently: Take 2 capsules po with each meal.Taking PRN Indications: exocrine pancreatic insufficiency )  lisinopril, Take 10 mg by mouth daily. 10 mg (half tablet) daily  melatonin, Take 3 mg by mouth at bedtime. PRN for sleep  multivitamin, Take 1 tablet by mouth daily.  PARoxetine, Take 1 tablet by mouth daily.  roPINIRole, Take 1 tablet (0.25 mg total) by mouth daily with dinner. Take one tablet (0.25 mg) 1-2 hours before bedtime, for three days, then take 2 tablets (0.5 mg) 1-2 hours before bedtime for four days, then take 4 tablets (1 mg) 1-2 hours before bedtime thereafter    No current facility-administered medications for this visit.   PMHx:  has a past medical history of Basal cell cancer; GERD (gastroesophageal reflux disease); Hyperlipidemia; Hypertension; IBS (irritable bowel syndrome); and OSA (obstructive sleep apnea).  Allergies: Penicillins; Formaldehyde analogues; Promethazine hcl; Latex; and Sulfa (sulfonamide  antibiotics)  PSHx:  has a past surgical history that includes Appendectomy; submastoid gland removal; Uvulopalatopharyngoplasty; Tonsillectomy; Colonoscopy (N/A, 01/23/2013); Flexible bronchoscopy w/ upper endoscopy (N/A, 09/17/2016); Thoracoscopy (Right, 11/23/2016); and Loop Implant  Implantation (N/A, 04/07/2017).  Social [id number] status: Never Smoker                                                              Smokeless tobacco: Never Used  Alcohol use: Yes              Comment: 2-3 wine/week    ZOX:WRUEAV history includes Diabetes in her son. She was adopted.    ROS: relevant cx d/w pt in hpi, o/w neg     OBJECTIVE  Blood pressure 110/70, pulse 71, height 5' 6 (1.676 m), weight 184 lb (83.5 kg), SpO2 94 %.  Body mass index is 29.7 kg/m??.  General:   Resp: BS+ve, no w/r/c  CV: s1, s2 rrr, no m/g/r  Neurologic Exam  Appearance: well developed, well nourished and in no acute distress   Language/Speech: no aphasia and no dysarthria   Atten/concentration: awake and alert   Orientation: oriented x4  Memory: grossly intact   Fund of Knowledge: grossly intact   Ophthalmoscopic: no papilledema   Pupils: equal, round and reactive to light   Visual Fields: normal   CN 3,4,6 (EOM): extraocular movements intact, normal gaze alignment, and no nystagmus   Cranial Nerve 5 (Trigeminal): bilateral facial sensation normal   Cranial Nerve 7 (Facial): normal facial symmetry and strength   CN 8 (Auditory): proper hearing to finger rub bilaterally   Cranial Nerve 9 (Glossophar): soft palate elevates in the midline spontaneously   CN 11 (spinal access): full shoulder shrug equally   Cranial Nerve 12 (Hypoglossal): tongue is midline on protrusion   Muscle Strength: 5/5 in all major muscle groups.   Muscle Tone: normal in all muscle groups of the upper and lower extremities.   Sensory: Normal LT throughout  DTR's: 2+ throughout  Babinski: negative bilaterally  Station normal  Gait normal to all modalities  FFM  intact bilaterally  Finger to nose: intact bilaterally  Heel-knee-shin: intact bilaterally  Tremors/ parkinsonism absent on exam     Imaging:  NEURO images were independently reviewed by me.    CTA head/neck (04/10/17)-   1. Normal CT HEAD with and without contrast for age.  2. Negative CTA head and CTA neck.  3. Severe RIGHT C3-4 and LEFT C6-7 neural foraminal narrowing.    Ct Head Wo Contrast  Result Date: 03/30/2017  IMPRESSION: No evidence of acute ischemia, hemorrhage, or mass effect. Approved by Hermina Staggers, MD on 03/30/2017 6:57 PM EST I have personally reviewed the images and I agree with this report. Report Verified by: Drake Leach, M.D. at 03/30/2017 7:04 PM EST    Mri Head W Wo Contrast  Result Date: 02/20/2017  IMPRESSION: Mild chronic microvascular ischemic white matter disease with no evidence of intracranial neoplasm. Tortuosity of the basilar artery causes mild displacement of the right 6th cranial nerve, of uncertain relation to clinical symptomatology. A variant of vascular compression syndrome cannot be excluded. Report Verified by: Veto Kemps, M.D. at 02/20/2017 3:30 PM EDT    Code Stroke-ct Head Wo Contrast  Result Date: 02/19/2017  IMPRESSION: Age-indeterminate potentially remote lacunar infarct left anterior limb internal capsule. If neurologic deficit correlates, and/or there is further concern for acute infarct, MR with diffusion could be helpful for further evaluation. Report Verified by: Veto Kemps, M.D. at 02/19/2017 6:33 PM EDT      Ct Abdomen And Pelvis Wo Iv Contrast  Result Date: 12/29/2016  IMPRESSION: Findings consistent with uncomplicated diverticulitis.. Report Verified by: Leda Roys at 12/29/2016 2:28 PM EDT      Ct Chest Wo Contrast  Result Date: 06/18/2017  IMPRESSION: Postsurgical changes from right middle lobectomy. 8 mm subsolid nodule left upper lobe that remains indeterminate however may represent indolent adenocarcinoma  spectrum as there has been mild increase  in density since 2014. Continue follow-up. Report Verified by: Odie Sera, M.D. at 06/18/2017 1:43 PM EST    Ct Chest Wo Contrast  Result Date: 08/14/2016  IMPRESSION: While predominantly fluid attenuation, the right middle lobe nodule is mildly increased in size compared to 3 years previously. This is most likely a benign lesion such as granuloma or hamartoma. The fluid attenuation makes carcinoid tumor less likely. Nevertheless, due to the interval growth a follow-up CT in 12 months should be considered. 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 This can be followed at the same time. Report Verified by: Rick Duff, M.D. at 08/14/2016 12:53 PM EDT    Nm Pet Ct Tumor Skull To Mid-thigh Pi  Result Date: 08/28/2016  IMPRESSION: Since PET/CT  July 27, 2012 1.  Mildly increased size and metabolic activity of right middle lobe pulmonary nodule, unchanged part solid nodule in the left upper lobe with abnormal FDG uptake, suspicious for indolent lung malignancies.  2. No evidence of metastasis. Report Verified by: Maureen Chatters, MD at 08/28/2016 3:13 PM EDT    Nm Pet Ct Tumor Skull To Mid-thigh-ps  Result Date: 07/02/2017  IMPRESSION: Since PET/CT  August 28, 2016 1.  Increased metabolic activity of the left upper lobe pulmonary nodule consistent with malignancy, primary lung cancer versus metastasis. 2. Patient is status post right middle lobectomy. Linear FDG uptake along the staple line, probably chronic inflammation. Attention on follow-up. Report Verified by: Maureen Chatters, MD at 07/02/2017 6:03 PM EST    X-ray Lumbar Spine Puncture Dx  Result Date: 02/22/2017  IMPRESSION: 1.  Successful fluoroscopic-guided lumbar puncture. Approximately 20 mL of clear cerebrospinal fluid was drained. 2.  Opening pressure of 14.5 cm CSF. Report Verified by: J. Laurita Quint, M.D. at 02/22/2017 12:55 PM EDT      ECHO  02/22/2017  Study Conclusions    - Left ventricle: The cavity size was normal. Wall thickness was  ????normal. Systolic function was normal. The estimated ejection  ????fraction was in the range of 60% to 65%. Wall motion was normal;  ????there were no regional wall motion abnormalities.There was a  ????small, well circumscribed echodensity at the left ventricular  ????apex, of uncertain etiology. There are no accompanying wall motion  ????abnormalities to suggest an apical thrombus. Doppler parameters  ????are consistent with abnormal left ventricular relaxation (grade 1  ????diastolic dysfunction).  - Mitral valve: Mild regurgitation.  - Right ventricle: Systolic function was normal.  - Atrial septum: No defect or patent foramen ovale was identified.  - Pulmonic valve: Peak gradient (S): 4mm Hg.    Repeat TTE With Definity Contrast - Impressions: ??No evidence of apical thrombus on definity echo      Labs:  Lab Results       Component                Value               Date                       WBC                      9.9                 03/30/2017  HGB                      13.2                03/30/2017                 HCT                      39.5                03/30/2017                 MCV                      91.4                03/30/2017                 PLT                      183                 03/30/2017            Lab Results       Component                Value               Date                       ALT                      14                  12/29/2016                 AST                      11 (L)              12/29/2016                 ALKPHOS                  69                  12/29/2016                 BILITOT                  0.8                 12/29/2016            Lab Results       Component                Value               Date                       CREATININE               1.50 (H)            03/30/2017                 BUN  38 (H)              03/30/2017                  NA                       139                 03/30/2017                 K                        4.1                 03/30/2017                 CL                       106                 03/30/2017                 CO2                      25                  03/30/2017            No results found for: TSH, T3TOTAL, T4TOTAL, T3FREE, FREET4, THYROIDAB   Lab Results       Component                Value               Date                       HGBA1C                   5.5                 02/19/2017            Lab Results       Component                Value               Date                       CHOLTOT                  174                 02/22/2017                 TRIG                     249 (H)             02/22/2017                 HDL                      41 (L)              02/22/2017                 LDL  83                  02/22/2017            No results found for: VITD25H  No results found for: VITAMINB12     ASSESSEMENT and PLAN:   Encounter Diagnosis   Name Primary?   ??? 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      CLINIC/ OUTPATIENT BILLING:    Level 5 Established Patient: counseling reg stroke prevention / tests reviewed and/or coordination of care consumed > 50 % of the encounter with patient and /or family, with a  face-to-face time > 21 minutes of a 40 minute  evaluation; as reported in the assessment and plan above.

## 2017-08-08 ENCOUNTER — Inpatient Hospital Stay: Admit: 2017-08-09 | Payer: MEDICARE | Attending: Cardiovascular Disease

## 2017-08-08 DIAGNOSIS — I639 Cerebral infarction, unspecified: Secondary | ICD-10-CM

## 2017-08-08 NOTE — Progress Notes (Signed)
Device interrogation reviewed.  ILR interrogation shows sinus rhythm.  No AF.

## 2017-08-26 NOTE — Progress Notes (Signed)
Spoke with patient regarding lack of communication from loop monitor. Patient stated that she had been out of town for the last few night at a family function. Patient stated she will make sure that her monitor is by where she will be sleeping when she returns home.

## 2017-09-08 ENCOUNTER — Inpatient Hospital Stay: Admit: 2017-09-08 | Payer: MEDICARE | Attending: Cardiovascular Disease

## 2017-09-08 DIAGNOSIS — I639 Cerebral infarction, unspecified: Secondary | ICD-10-CM

## 2017-09-08 NOTE — Unmapped (Signed)
Device interrogation reviewed.  ILR interrogation shows sinus rhythm.  No AF.

## 2017-10-09 ENCOUNTER — Inpatient Hospital Stay: Admit: 2017-10-11 | Payer: MEDICARE

## 2017-10-09 DIAGNOSIS — I639 Cerebral infarction, unspecified: Secondary | ICD-10-CM

## 2017-10-11 NOTE — Progress Notes (Signed)
Device interrogation reviewed. Loop recorder implanted for cryptogenic stroke. No arrhythmias detected.

## 2017-10-15 NOTE — Telephone Encounter (Signed)
October 15, 2017 3:47 PM      Joanna Lopez has called to advise of the following:    She will be traveling to [place] and has inquired if she should take her cardiac event monitor on this trip. (will it work from there) ?      Please contact the patient at [phone] (home)

## 2017-10-15 NOTE — Telephone Encounter (Signed)
Call returned to patient.  Explained it should be OK to leave the monitor at home. Will transmit when she returns from her trip. Will notify staff that she will be traveling from October 27, 2017 through November 05, 2017 so they know she will not be sending transmissions.

## 2017-11-10 ENCOUNTER — Inpatient Hospital Stay: Admit: 2017-11-10 | Payer: MEDICARE | Attending: Cardiovascular Disease

## 2017-11-10 DIAGNOSIS — I639 Cerebral infarction, unspecified: Secondary | ICD-10-CM

## 2017-11-10 NOTE — Progress Notes (Signed)
Device interrogation reviewed.  ILR interrogation shows sinus rhythm.  No AF.

## 2017-11-12 NOTE — Telephone Encounter (Signed)
The patient called wanting to know if upcoming appt is necessary. Please advise.

## 2017-11-12 NOTE — Telephone Encounter (Signed)
Left pt a VM.  Could we gather some more info?  Pt was just seen in March.  Is she doing well?  Any new sx?

## 2017-11-26 NOTE — Telephone Encounter (Signed)
Left pt a detailed VM stating that MD id leaving the practice in September.  Informed her that this will be her last opportunity to meet with MD.  Asked about new sx/issues.  It is up to the pt whether she wants to keep her appt or not though

## 2017-12-11 ENCOUNTER — Inpatient Hospital Stay: Admit: 2017-12-13 | Payer: MEDICARE

## 2017-12-11 DIAGNOSIS — I639 Cerebral infarction, unspecified: Secondary | ICD-10-CM

## 2017-12-13 NOTE — Unmapped (Signed)
Device interrogation reviewed. Loop recorder implanted for cryptogenic stroke. No arrhythmias detected.

## 2017-12-31 ENCOUNTER — Ambulatory Visit: Admit: 2017-12-31 | Payer: MEDICARE | Attending: Pulmonary Disease

## 2017-12-31 DIAGNOSIS — R911 Solitary pulmonary nodule: Secondary | ICD-10-CM

## 2017-12-31 NOTE — Unmapped (Signed)
History of Present Illness:      Joanna Lopez is a 67 y.o. female.    HPI    I saw Joanna Lopez last time in October 2015  She had another CT chest done - was seen by her PCP who called me with results  She feels the same  No major changes since last time I saw he  Patient denies hemoptysis, increased shortness of breath, chest pains, headaches, nausea, vomiting, diarrhea  No fevers or chills. No new skin rashes    02/19/2017  Had lobectomy with dr Harriette Bouillon - typical carcinoid  Mild cough  No chest pains    07/02/2017  Stroke like events over the last few months  To have PETCT done today  No chest pains  Under lots of stress  Accompanied by her son  No headaches    12/31/2017    No changes  Accompanied by her daughter in law today  Breathing wise she is about the same    Review of Systems   Constitutional: Positive for fatigue.   HENT: Positive for postnasal drip.    Respiratory: Positive for cough, shortness of breath and wheezing.    Gastrointestinal: Positive for bloating and diarrhea.   Musculoskeletal: Positive for arthralgias.   Neurological: Positive for tremors.   Hematological: Bruises/bleeds easily.   Psychiatric/Behavioral: The patient is nervous/anxious.    All other systems reviewed and are negative.      Objective:   Blood pressure 142/82, pulse 67, height 5' 6.5 (1.689 m), weight 194 lb 9.6 oz (88.3 kg), SpO2 96 %.    Physical Exam    Nursing note and vitals reviewed.   Constitutional: Oriented to person, place, and time. Appears well-developed and well-nourished. No distress.   HENT: pupils equal, no scleral icterus, on conjunctivitis, no enlarged thyroid  Head: Normocephalic.    Ears: External ear normal.   Mouth/Throat: Oropharynx is clear and moist. No oropharyngeal exudate.   Eyes: Conjunctivae are normal. Pupils are equal, round, and reactive to light. Right eye exhibits no discharge. Left eye exhibits no discharge.   Neck: Normal range of motion. Neck supple. No JVD present. No  tracheal deviation present. No thyromegaly present.   Cardiovascular: Normal rate, regular rhythm, normal heart sounds and intact distal pulses. Exam reveals no gallop and no friction rub.   No murmur heard.   Pulmonary/Chest: Effort normal and breath sounds normal. No stridor. No respiratory distress. No wheezes. No rales. Exhibits no tenderness.   Abdominal:Exhibits no distension and no mass. There is no tenderness. There is no rebound and no guarding.   Musculoskeletal: exhibits no edema and no tenderness.   Neurological: alert and oriented to person, place, and time. normal reflexes. No cranial nerve deficit. Coordination normal.   Skin: Warm and dry. Not diaphoretic. No erythema.   Psychiatric: Normal mood and affect. Behavior is normal. Judgment and thought content normal.       Exam: CT CHEST WO CONTRAST dated 08/14/2016 10:17 AM EDT  ??  CLINICAL HISTORY: lung nodules;   ??  COMPARISON: February 07, 2014  ??  TECHNIQUE: Multidetector  CT imaging was obtained through the chest in the supine position without intravenous contrast. The images were reconstructed with 5 and 1 mm collimation using a 38 cm field of view.  ??  FINDINGS: The lung windows show that the central airways are patent. Small calcified nodules reflect healed granulomatous infection.  ??  The well-defined noncalcified nodule in the medial segment right middle  lobe measures approximately 2.1 x 1.8 cm on series 5 image 214, previously 1.7 x 1.5 cm on February 07, 2014. The central portion of this nodule is fluid attenuation.  ??  The irregular ill-defined subsolid nodule in the superior lingular subsegment of the left upper lobe on series 5 image 193 measures approximately 0.9 x 0.4 cm.  ??  A tiny subpleural nodule at the left apex laterally on series 5 image 73 is unchanged. There are no new lesions.  ??  The mediastinal windows show granulomatous calcification but otherwise no lymphadenopathy.  ??  No coronary artery calcifications are visible. The  caliber of the great vessels is normal.  ??  Limited noncontrast imaging through the upper abdomen shows multiple low-attenuation liver lesions, similar to prior studies, likely cysts. A fluid attenuation lesion centrally in the left kidney is larger compared to 2014 but likely a peripelvic cyst.  ??  The bone windows show no suspicious lesions.  ??  ??  IMPRESSION:  ??  While predominantly fluid attenuation, the right middle lobe nodule is mildly increased in size compared to 3 years previously. This is most likely a benign lesion such as granuloma or hamartoma. The fluid attenuation makes carcinoid tumor less likely. Nevertheless, due to the interval growth a follow-up CT in 12 months should be considered.  ??  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 This can be followed at the same time.  ??  Report Verified by: Rick Duff, M.D. at 08/14/2016 12:53 PM EDT    Assessment:     Lung nodules, carcinoid, s/p resection VATS RML  Lung nodule LUL  ??  Plan:   ??  Patient with a large nodule RML that on path was typical carcinoid  The LUL nodule will be reassess now with CT chest, question of adenocarcinoma  Being seen by Neuro for cryptogenic stroke  ??  Flu shot recommended every year  ??

## 2018-01-04 ENCOUNTER — Inpatient Hospital Stay: Admit: 2018-01-04 | Payer: MEDICARE | Attending: Pulmonary Disease

## 2018-01-04 DIAGNOSIS — R911 Solitary pulmonary nodule: Secondary | ICD-10-CM

## 2018-01-04 NOTE — Unmapped (Signed)
Physician responded to pts question below    No lobectomy at this time   We should just watch to see if it gets bigger  Returned call to pt,advised of physicians response  Pt voiced understanding and was grateful for the call

## 2018-01-04 NOTE — Telephone Encounter (Signed)
error 

## 2018-01-04 NOTE — Unmapped (Signed)
Placed call to pt per Dr. Damaris Hippo   Regarding result message below    Please let her know that CT chest is stable   We should repeat another one in 6 months from now    Explained stable means no changes  Pt asked if she still needs lobectomy   Will route message to physician to review

## 2018-01-11 ENCOUNTER — Inpatient Hospital Stay: Admit: 2018-01-11 | Payer: MEDICARE

## 2018-01-11 DIAGNOSIS — I639 Cerebral infarction, unspecified: Secondary | ICD-10-CM

## 2018-01-11 NOTE — Progress Notes (Signed)
Device interrogation reviewed. Loop recorder implanted for cryptogenic stroke. No arrhythmias detected.

## 2018-01-17 NOTE — Progress Notes (Signed)
Patient no-showed today's appointment:

## 2018-01-19 NOTE — Telephone Encounter (Signed)
Received notification that the patient's cardiac device is not communicating with her at home monitor.spoke with patient to verify that the monitor is plugged in and that she is sleeping in the same room with the monitor.IPatient stated that she hasn't been in the same room as the monitor for the last few nights but will be as of tonight.

## 2018-01-25 NOTE — Telephone Encounter (Signed)
Received notification that patient's cardiac device is not communicating.  Left message for patient regarding this.  Asked patient to ensure that the home monitor is plugged in and next to where she sleeps at night.  Also requested a manual transmission be sent in to ensure equipment is working.  Left diagnostic cardiology phone number for questions.

## 2018-02-11 ENCOUNTER — Inpatient Hospital Stay: Admit: 2018-02-11 | Discharge: 2018-02-19 | Payer: MEDICARE | Attending: Cardiovascular Disease

## 2018-02-11 DIAGNOSIS — I639 Cerebral infarction, unspecified: Secondary | ICD-10-CM

## 2018-02-11 NOTE — Unmapped (Signed)
Device interrogation reviewed.  ILR interrogation shows sinus rhythm.  No AF.

## 2018-03-14 ENCOUNTER — Inpatient Hospital Stay: Admit: 2018-03-14 | Payer: MEDICARE | Attending: Cardiovascular Disease

## 2018-03-14 DIAGNOSIS — I639 Cerebral infarction, unspecified: Secondary | ICD-10-CM

## 2018-03-14 NOTE — Unmapped (Signed)
Device interrogation reviewed.  ILR interrogation shows sinus rhythm.  No AF.

## 2018-04-22 NOTE — Telephone Encounter (Signed)
Pt calling to schedle at [facility] and can be reached at [phone]

## 2018-04-25 NOTE — Telephone Encounter (Signed)
Called patient and let her know what the Dr said.

## 2018-04-25 NOTE — Telephone Encounter (Signed)
Called patient she is requesting a colonoscopy and wants to make sure its time. Her last one was in 2014, normal procedure. No time frame listed on op note for when to do procedure again. 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  Informed her I'd reach out to the Dr. After I received an answer I would call her back.

## 2018-04-25 NOTE — Unmapped (Signed)
Joanna Shadow, MD  Joanna Lopez   Caller: Unspecified (3 days ago, ??2:24 PM)      ??      Should be ok to wait until 10-year mark, which would be Sept 2024.   NS    Previous Messages

## 2018-05-14 DIAGNOSIS — I639 Cerebral infarction, unspecified: Secondary | ICD-10-CM

## 2018-05-14 NOTE — Unmapped (Signed)
Device interrogation reviewed  NSR  No arrhythmias

## 2018-05-15 ENCOUNTER — Inpatient Hospital Stay: Admit: 2018-05-15 | Payer: MEDICARE

## 2018-05-23 NOTE — Unmapped (Signed)
May 23, 2018 1:12 PM    [place] has called to inquire of the following:    ?? Can she complete the PET CT scan prior to her appointment on 06/17/2018 ?    If so, please complete orders.    Please contact the patient at [phone] (home)

## 2018-05-23 NOTE — Unmapped (Signed)
Order request for ct chest routed to physician to review

## 2018-05-26 NOTE — Unmapped (Signed)
Spoke with pt advised ct order is signed  Provided number for pt to schedule

## 2018-05-31 ENCOUNTER — Inpatient Hospital Stay: Admit: 2018-05-31 | Payer: MEDICARE | Attending: Pulmonary Disease

## 2018-05-31 DIAGNOSIS — R911 Solitary pulmonary nodule: Secondary | ICD-10-CM

## 2018-06-08 ENCOUNTER — Ambulatory Visit: Payer: MEDICARE | Attending: Pulmonary Disease

## 2018-06-11 ENCOUNTER — Emergency Department: Admit: 2018-06-11 | Payer: MEDICARE

## 2018-06-11 ENCOUNTER — Inpatient Hospital Stay: Admit: 2018-06-11 | Discharge: 2018-06-11 | Disposition: A | Discharge status: Home / Self Care | Payer: MEDICARE

## 2018-06-11 DIAGNOSIS — H539 Unspecified visual disturbance: Secondary | ICD-10-CM

## 2018-06-11 LAB — POC GLU MONITORING DEVICE: POC Glucose Monitoring Device: 109 mg/dL (ref 70–100)

## 2018-06-11 MED ORDER — fluorescein (FLUOR-I-STRIP) ophthalmic strip 1 mg
1 | Freq: Once | OPHTHALMIC | Status: AC
Start: 2018-06-11 — End: 2018-06-11

## 2018-06-11 MED ORDER — tetracaine HCl (PF) (PONTOCAINE) 0.5 % ophthalmic solution 1-2 drop
0.5 | Freq: Once | OPHTHALMIC | Status: AC
Start: 2018-06-11 — End: 2018-06-11

## 2018-06-11 MED FILL — TETRACAINE HCL (PF) 0.5 % EYE DROPS: 0.5 0.5 % | OPHTHALMIC | Qty: 4

## 2018-06-11 MED FILL — FUL-GLO 1 MG EYE STRIPS: 1 1 mg | OPHTHALMIC | Qty: 1

## 2018-06-11 NOTE — Unmapped (Signed)
Visual complaints: pt states that within the past hour she has had flashes of light and then floaters as well. Denies any vision loss. Denies pain

## 2018-06-11 NOTE — Unmapped (Signed)
[address]  [place], [place] [address]

## 2018-06-11 NOTE — Unmapped (Signed)
[facility] ED Note    Date of Service: 06/11/2018    Reason for Visit: Visual Complaints (pt states that within the past hour she has had flashes of light and then floaters as well. Denies any vision loss)      Patient History     HPI:  Joanna Lopez is a 68 y.o. female  With past medical history of lung cancer and as shown below who presents to the emergency department with complaints of visual complaints. Starting 2 nights ago, she began seeing arcs of light on her right eye. She was feeling okay yesterday morning but then she began having these episodes again last night. She also had a few more episodes today along with seeing floaters and black dots which caused her to come to the ED today. She states that since the episodes have started, the episodes have been worsening in severity. These episodes have all occurred in the same spot in her visual field the right upper and right lower quadrant of her right eye (temporal side)    She denies vision loss, double vision, fevers, chest pain, shortness of breath,vomiting, weakness, and loss of appetite.    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 She had this surgery about 20 years ago. Her symptoms are similar to when she had to receive this surgery. She has not had these problems since receiving this surgery. She denies having any other surgeries on her eyes.     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 She then had an interruption to the blood flow in her right eye after these CVA's causing her to have double vision in her right eye for 5-6 months.    She wears glasses and is near sided. She is not currently doing chemotherapy for her lung cancer.    PMH: Nursing notes reviewed   PSH: Nursing notes reviewed   FH: Nursing notes reviewed   MEDS: Nursing notes and chart reviewed   ALLERGIES: Nursing notes and chart reviewed       Past Medical History:   Diagnosis Date   ??? Basal cell cancer    ??? GERD (gastroesophageal reflux disease)     ??? Hyperlipidemia    ??? Hypertension    ??? IBS (irritable bowel syndrome)    ??? OSA (obstructive sleep apnea)        Past Surgical History:   Procedure Laterality Date   ??? APPENDECTOMY     ??? COLONOSCOPY N/A 01/23/2013    Procedure: COLONOSCOPY WITH MAC;  Surgeon: Lurene Shadow, MD;  Location: [facility] ENDOSCOPY;  Service: Gastroenterology;  Laterality: N/A;   ??? FLEXIBLE BRONCHOSCOPY W/ UPPER ENDOSCOPY N/A 09/17/2016    Procedure: BRONCHOSCOPY SUPER D WITH FLUORO AND BIOPSY-EBUS/NAVI;  Surgeon: Johnny Bridge, MD;  Location: [facility] ENDOSCOPY;  Service: Pulmonary;  Laterality: N/A;   ??? LOOP IMPLANT IMPLANTATION N/A 04/07/2017    Procedure: Loop  Implantation;  Surgeon: Tennis Must, MD;  Location: [facility] ELECTROPHYSIOLOGY LAB;  Service: Electrophysiology;  Laterality: N/A;   ??? submastoid gland removal     ??? THORACOSCOPY Right 11/23/2016    Procedure: RIGHT SIDED VATS MIDDLE LOBE LOBECTOMY,HILAR MEDIASTINAL LYMPH NODE DISSECTION;  Surgeon: Janett Billow, MD;  Location: [facility] OR;  Service: Thoracic;  Laterality: Right;   ??? TONSILLECTOMY     ??? UVULOPALATOPHARYNGOPLASTY         Joanna Lopez  reports that she has never smoked. She has never used  smokeless tobacco. She reports current alcohol use. She reports that she does not use drugs.    Previous Medications    ACETAMINOPHEN (TYLENOL) 325 MG TABLET    Take 3 tablets (975 mg total) by mouth every 6 hours as needed.    ASPIRIN 325 MG TABLET    Take 1 tablet (325 mg total) by mouth daily.    ASPIRIN-ACETAMINOPHEN-CAFFEINE (EXCEDRIN) 250-250-65 MG PER TABLET    Take 2 tablets by mouth every 6 hours as needed for Pain.    ATORVASTATIN (LIPITOR) 80 MG TABLET    Take 1 tablet (80 mg total) by mouth at bedtime.    DICYCLOMINE (BENTYL) 20 MG TABLET    Take 20 mg by mouth 4 times a day with meals and at bedtime.    FLUTICASONE (FLONASE) 50 MCG/ACTUATION NASAL SPRAY    Use 2 sprays into each nostril if needed.    HYDROCHLOROTHIAZIDE (HYDRODIURIL) 25 MG TABLET    Take 1 tablet by mouth  daily.    LIPASE-PROTEASE-AMYLASE (CREON) 24,000-76,000 -120,000 UNIT CPDR    Take 2 capsules po with each meal.    LISINOPRIL (PRINIVIL,ZESTRIL) 20 MG TABLET    Take 10 mg by mouth daily. 10 mg (half tablet) daily    MELATONIN 3 MG TAB    Take 3 mg by mouth at bedtime. PRN for sleep    MULTIVITAMIN (MULTIVITAMIN) TABLET    Take 1 tablet by mouth daily.    PAROXETINE (PAXIL) 20 MG TABLET    Take 1 tablet by mouth daily.    ROPINIROLE (REQUIP) 0.25 MG TABLET    Take 1 tablet (0.25 mg total) by mouth daily with dinner. Take one tablet (0.25 mg) 1-2 hours before bedtime, for three days, then take 2 tablets (0.5 mg) 1-2 hours before bedtime for four days, then take 4 tablets (1 mg) 1-2 hours before bedtime thereafter       Allergies:   Allergies as of 06/11/2018 - Fully Reviewed 06/11/2018   Allergen Reaction Noted   ??? Penicillins Anaphylaxis 07/21/2012   ??? Formaldehyde analogues  07/21/2012   ??? Promethazine hcl  04/10/2017   ??? Latex Rash 12/03/2011   ??? Sulfa (sulfonamide antibiotics) Rash 07/21/2012       Review of Systems     Review of Systems - 12 point review of systems was preformed. Pertinent positives and negatives as listed in HPI, all other ROS are negative including vision loss, double vision, fevers, chest pain, shortness of breath,vomiting, weakness, and loss of appetite.    Physical Exam     ED Triage Vitals [06/11/18 1529]   Vital Signs Group      Temp 98 ??F (36.7 ??C)      Temp Source Oral      Heart Rate 70      Heart Rate Source Monitor;Automatic      Resp 16      SpO2 97 %      BP 143/77      MAP (mmHg) 93      BP Location Right arm      BP Method Automatic      Patient Position Sitting   SpO2 97 %   O2 Device None (Room air)       General:  White female in NAD    HEENT:  Head atraumatic, pupils equal round and reactive to light, extraocular movements intact, sclera clear, mucus membranes moist, oropharynx nonerythematous normal visual fields, no ptosis, normal VA with glassed  Neck:  Supple, no  lymphadenopathy     Pulmonary:   Clear to auscultation bilaterally, no wheezes, rhonchi, or rales     Cardiac:  Regular rate and rhythm, normal S1S2, no murmurs, rubs, or gallops     Abdomen:  Soft, nontender, nondistended, no rebound and no guarding     Musculoskeletal:  No obvious deformities, no tenderness to palpation     Vascular:  2+ radial pulses bilaterally, 2+ dorsalis pedis pulses bilaterally     Skin:  Warm, dry, well perfused, no rashes     Neuro:  Alert and oriented x4, speech is clear and intact without dysarthria, gait intact, CN 2-12 intact, strength is equal and symmetric in the UEs and LEs, SLIT in the UEs and LEs    Psych:  Appropriate mood and affect       Diagnostic Studies     Labs:    Please see electronic medical record for any tests performed in the ED     Radiology:    Please see electronic medical record for any tests performed in the ED     EKG:    No EKG Performed    Emergency Department Procedures     None, please see procedure note for specific details.    ED Course and MDM     Joanna Lopez is a 68 y.o. female patient who presented to the emergency department with floaters and flashing lights     Patient presents to the emergency department with flashing lights and floaters.  Visual acuity with glasses was intact and she does not have any visual field deficits however she complains of persistent symptoms and I'm concerned for possible [employer] to hair.  Head CT is unremarkable finger stick blood glucose was normal.  I spoke with ophthalmology at [facility] who wanted the patient and he'll transfer to the [facility] office before they closed and hold it open for her to further evaluate her.  Patient and her friend are going to drive over to the office immediately.    Consults:  Ophtho  Summary of Treatment in ED:  Medications - No data to display    Impression   1.  Pt is a 68 y.o. female  possible retinal detachment, discharged    Scribe Attestation  By signing my name below,  I, Farrel Gordon, attest that this documentation has been prepared under the direction and in the presence of Melford Aase, MD  Scribe name: Farrel Gordon Date: 06/11/2018      ----  Attending Physician Attestation  The scribe???s documentation has been prepared under my direction and personally reviewed by me in its entirety. I confirm that the note above accurately reflects all work, treatment, procedures, and medical decision making performed by me.            Freda Munro, MD  06/11/18 2300

## 2018-06-13 ENCOUNTER — Ambulatory Visit: Admit: 2018-06-13 | Discharge: 2018-06-13 | Payer: MEDICARE | Attending: Pulmonary Disease

## 2018-06-13 DIAGNOSIS — R911 Solitary pulmonary nodule: Secondary | ICD-10-CM

## 2018-06-13 NOTE — Unmapped (Signed)
History of Present Illness:      Joanna Lopez is a 68 y.o. female.    HPI    I saw Joanna Lopez last time in October 2015  She had another CT chest done - was seen by her PCP who called me with results  She feels the same  No major changes since last time I saw he  Patient denies hemoptysis, increased shortness of breath, chest pains, headaches, nausea, vomiting, diarrhea  No fevers or chills. 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   HENT: Positive for postnasal drip.    Respiratory: Positive for cough, shortness of breath and wheezing.    Gastrointestinal: Positive for bloating and diarrhea.   Musculoskeletal: Positive for arthralgias.   Neurological: Positive for tremors.   Hematological: Bruises/bleeds easily.   Psychiatric/Behavioral: The patient is nervous/anxious.    All other systems reviewed and are negative.      Objective:   Blood pressure 130/74, pulse 67, height 5' 7 (1.702 m), weight 208 lb (94.3 kg), SpO2 95 %.    Physical Exam    Nursing note and vitals reviewed.   Constitutional: Oriented to person, place, and time. Appears well-developed and well-nourished. No distress.   HENT: pupils equal, no scleral icterus, on conjunctivitis, no enlarged thyroid  Head: Normocephalic.    Ears: External ear normal.   Mouth/Throat: Oropharynx is clear and moist. No oropharyngeal exudate.   Eyes: Conjunctivae are normal. Pupils are equal, round, and reactive to light. Right eye exhibits no discharge.  Left eye exhibits no discharge.   Neck: Normal range of motion. Neck supple. No JVD present. No tracheal deviation present. No thyromegaly present.   Cardiovascular: Normal rate, regular rhythm, normal heart sounds and intact distal pulses. Exam reveals no gallop and no friction rub.   No murmur heard.   Pulmonary/Chest: Effort normal and breath sounds normal. No stridor. No respiratory distress. No wheezes. No rales. Exhibits no tenderness.   Abdominal:Exhibits no distension and no mass. There is no tenderness. There is no rebound and no guarding.   Musculoskeletal: exhibits no edema and no tenderness.   Neurological: alert and oriented to person, place, and time. normal reflexes. No cranial nerve deficit. Coordination normal.   Skin: Warm and dry. Not diaphoretic. No erythema.   Psychiatric: Normal mood and affect. Behavior is normal. Judgment and thought content normal.       Exam: CT CHEST WO CONTRAST dated 08/14/2016 10:17 AM EDT  ??  CLINICAL HISTORY: lung nodules;   ??  COMPARISON: February 07, 2014  ??  TECHNIQUE: Multidetector  CT imaging was obtained through the chest in the supine position without intravenous contrast. The images were reconstructed with 5 and 1 mm collimation using a 38 cm field of view.  ??  FINDINGS: The lung windows show that the central airways  are patent. Small calcified nodules reflect healed granulomatous infection.  ??  The well-defined noncalcified nodule in the medial segment right middle lobe measures approximately 2.1 x 1.8 cm on series 5 image 214, previously 1.7 x 1.5 cm on February 07, 2014. The central portion of this nodule is fluid attenuation.  ??  The irregular ill-defined subsolid nodule in the superior lingular subsegment of the left upper lobe on series 5 image 193 measures approximately 0.9 x 0.4 cm.  ??  A tiny subpleural nodule at the left apex laterally on series 5 image 73 is unchanged. There are no new lesions.  ??  The mediastinal windows show granulomatous  calcification but otherwise no lymphadenopathy.  ??  No coronary artery calcifications are visible. The caliber of the great vessels is normal.  ??  Limited noncontrast imaging through the upper abdomen shows multiple low-attenuation liver lesions, similar to prior studies, likely cysts. A fluid attenuation lesion centrally in the left kidney is larger compared to 2014 but likely a peripelvic cyst.  ??  The bone windows show no suspicious lesions.  ??  ??  IMPRESSION:  ??  While predominantly fluid attenuation, the right middle lobe nodule is mildly increased in size compared to 3 years previously. This is most likely a benign lesion such as granuloma or hamartoma. The fluid attenuation makes carcinoid tumor less likely. Nevertheless, due to the interval growth a follow-up CT in 12 months should be considered.  ??  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 This can be followed at the same time.  ??  Report Verified by: Rick Duff, M.D. at 08/14/2016 12:53 PM EDT    Assessment:     Lung nodules, carcinoid, s/p resection VATS RML  Lung nodule LUL  ??  Plan:   ??  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  Flu shot recommended every year  ??

## 2018-06-14 ENCOUNTER — Inpatient Hospital Stay: Admit: 2018-06-15 | Payer: MEDICARE | Attending: Cardiovascular Disease

## 2018-06-14 DIAGNOSIS — I639 Cerebral infarction, unspecified: Secondary | ICD-10-CM

## 2018-06-14 NOTE — Unmapped (Signed)
Device interrogation reviewed.  ILR interrogation shows sinus rhythm.  No AF.

## 2018-06-17 ENCOUNTER — Ambulatory Visit: Payer: MEDICARE | Attending: Pulmonary Disease

## 2018-07-06 ENCOUNTER — Encounter: Payer: MEDICARE | Attending: Cardiovascular Disease

## 2018-07-08 ENCOUNTER — Encounter: Payer: MEDICARE | Attending: Pulmonary Disease

## 2018-07-15 DIAGNOSIS — I639 Cerebral infarction, unspecified: Secondary | ICD-10-CM

## 2018-07-15 NOTE — Unmapped (Signed)
Device interrogation reviewed.  ILR interrogation shows sinus rhythm.  No AF.

## 2018-07-16 ENCOUNTER — Inpatient Hospital Stay: Admit: 2018-07-17 | Payer: MEDICARE | Attending: Cardiovascular Disease

## 2018-08-15 ENCOUNTER — Inpatient Hospital Stay: Admit: 2018-08-16 | Payer: MEDICARE | Attending: Cardiovascular Disease

## 2018-08-15 DIAGNOSIS — I639 Cerebral infarction, unspecified: Secondary | ICD-10-CM

## 2018-08-15 NOTE — Unmapped (Signed)
Device interrogation reviewed.  ILR interrogation shows sinus rhythm.  No AF.

## 2018-09-15 ENCOUNTER — Inpatient Hospital Stay: Admit: 2018-09-16 | Payer: MEDICARE | Attending: Cardiovascular Disease

## 2018-09-15 DIAGNOSIS — I639 Cerebral infarction, unspecified: Secondary | ICD-10-CM

## 2018-09-15 NOTE — Unmapped (Signed)
Device interrogation reviewed.  ILR interrogation shows sinus rhythm.  No AF.

## 2018-10-16 ENCOUNTER — Inpatient Hospital Stay: Admit: 2018-10-17 | Payer: MEDICARE

## 2018-10-16 DIAGNOSIS — I639 Cerebral infarction, unspecified: Secondary | ICD-10-CM

## 2018-10-16 NOTE — Unmapped (Signed)
Loop recorder implanted for cryptogenic stroke. No arrhythmias detected.

## 2018-10-20 NOTE — Unmapped (Signed)
Placed call to pt per appt change to telephone,requested call back to confirm

## 2018-10-22 ENCOUNTER — Institutional Professional Consult (permissible substitution): Admit: 2018-10-22 | Payer: MEDICARE

## 2018-10-22 ENCOUNTER — Ambulatory Visit: Admit: 2018-10-22 | Payer: MEDICARE

## 2018-10-22 DIAGNOSIS — Z01818 Encounter for other preprocedural examination: Secondary | ICD-10-CM

## 2018-10-22 LAB — 2019 NOVEL CORONAVIRUS (COVID-19), NAA-B: SARS-CoV-2: NOT DETECTED

## 2018-10-22 NOTE — Progress Notes (Signed)
Covid-19 nasopharyngeal specimen collected.

## 2018-10-24 ENCOUNTER — Inpatient Hospital Stay: Admit: 2018-10-24 | Payer: MEDICARE | Attending: Pulmonary Disease

## 2018-10-24 DIAGNOSIS — R911 Solitary pulmonary nodule: Secondary | ICD-10-CM

## 2018-10-24 LAB — PFT13-PULMONARY FUNCTION TEST
DLCO%: 77
DLCO: 18.22
FEV1%: 102
FEV1/FVC EXP: 76
FEV1/FVC: 77
FEV1: 2.58
FVC%: 100
FVC: 3.33
RV%: 85
RV: 1.81
TLC%: 92
TLC: 5
VC%: 96
VC: 3.2

## 2018-10-24 NOTE — Unmapped (Signed)
[facility]    Pulmonary Function Tests Interpretation    Joanna Lopez is a 68 y.o. female who had a pulmonary function test performed at the [facility].    There is no demonstrable obstructive defect.  Lung volumes are normal.   Diffusion capacity is at lower limit of normal.    Read performed by:  Metro Kung, MD 10/27/2018 10:37 AM

## 2018-10-24 NOTE — Unmapped (Signed)
Placed call to pt physician would like appt changed to telephone,pt consented

## 2018-10-25 ENCOUNTER — Ambulatory Visit: Admit: 2018-10-25 | Payer: MEDICARE | Attending: Pulmonary Disease

## 2018-10-25 DIAGNOSIS — R911 Solitary pulmonary nodule: Secondary | ICD-10-CM

## 2018-10-25 NOTE — Unmapped (Addendum)
This was a phone conversation in lieu of an in-person visit. The patient provided verbal consent for an over the phone visit.    I spent 17 minutes on this call conducting an interview, performing a limited exam by phone, and educating the patient on my assessment and plan.    Patient is doing ok  No chest pains  Patient denies hemoptysis, increased shortness of breath, chest pains, headaches, nausea, vomiting, diarrhea  No fevers or chills. No new skin rashes    EXAM: CT CHEST WO CONTRAST   ??  INDICATION: Lung nodule, <1cm.  ??  COMPARISON: Multiple priors, most recently 05/31/2018.  ??  TECHNIQUE: Multidetector CT imaging was obtained through the chest in the supine position without the use of intravenous contrast. Additional axial MIP images were reconstructed.  ??  FINDINGS:   Medical devices: Cardiac loop recorder within the left anterior subcutaneous tissues   ??  Airways & lungs: The central airways are patent. Minimal biapical scarring. Stable postoperative changes of right middle lobectomy.  ??This was a phone conversation in lieu of an in-person visit. The patient provided verbal consent for an over the phone visit. Due to coronavirus pandemic, this was telephone encounter.    I spent 17 minutes on this call conducting an interview, performing a limited exam by phone, and educating the patient on my assessment and plan.  CT chest and PFTs were reviewed with patient  She is doing well  No breathing issues.    8 x 5 mm part solid left upper lobe pulmonary nodule (series 302 image 153), not significantly changed in size over multiple prior studies through 2018 but increased since remote CT chest studies from 2015 and 2014. No new or enlarging nodules.  ??  Pleura: No pleural effusions or pneumothorax.  ??  Lower Neck: Unremarkable.  ??  Heart: The heart is normal in size. No coronary artery calcifications. No significant pericardial effusion.  ??  Vascular structures: Aorta is normal in caliber. Main pulmonary artery  is normal in caliber. Usual 3-vessel branch configuration of a left aortic arch. Minimal scattered atherosclerosis.   ??  Mediastinum and hila: No pathologically enlarged lymph nodes. Sequela of healed granulomatous disease.   ??  Chest wall and axilla: Unremarkable.  ??  Upper abdomen: Multiple low-density lesions scattered throughout the hepatic parenchyma, likely cysts and/or hemangiomas. Scattered splenic granulomas.  ??  Musculoskeletal: No suspicious osteolytic or osteoblastic lesions.  ??  ??  IMPRESSION:  ??  Stable 8 x 5 mm part solid left upper lobe pulmonary nodule. Although unchanged from prior examination, this demonstrates slow growth and density dating back to 2014 and could be related to an indolent primary pulmonary neoplasm. Recommend continued follow-up.  ??  Approved by Lennart Pall, DO on 10/24/2018 11:24 AM EDT  ??  I have personally reviewed the images and I agree with this report.    Assessment:     Lung nodules, carcinoid, s/p resection VATS RML  Lung nodule LUL  ??  Plan:   ??  Patient with a large nodule RML that on path was typical carcinoid  The LUL nodule is stable at this time, will be reassessed with another CT chest in 6 months, question of adenocarcinoma  PFts look good  ??  Flu shot recommended every year  ??

## 2018-10-29 IMAGING — CT CT ANGIO HEAD
1 of 12 series · 5 of 33 positions shown · IV contrast (ISOVUE 370)
Comparison: None.

CLINICAL DATA: Acute headache.  History of cancer and stroke.

EXAM:
CT ANGIOGRAPHY HEAD AND NECK
TECHNIQUE: Multidetector CT imaging of the head and neck was performed using
the standard protocol during bolus administration of intravenous
contrast. Multiplanar CT image reconstructions and MIPs were
obtained to evaluate the vascular anatomy. Carotid stenosis
measurements (when applicable) are obtained utilizing NASCET
criteria, using the distal internal carotid diameter as the
denominator.
CONTRAST:  100 cc 0H6BVW-TIZ IOPAMIDOL (0H6BVW-TIZ) INJECTION 76%

[Series 14: axial thin · axial · 0.39mm/px · z∈[+1144,+1376]mm · 5 of 349 slices shown]
[im 59/349  soft-tissue]
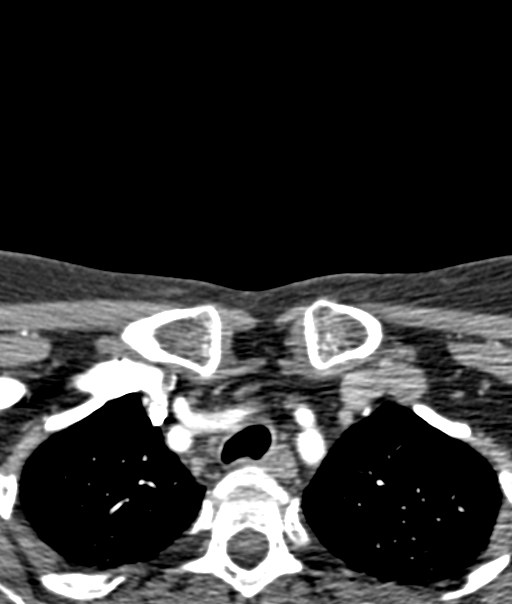
[im 117/349  bone]
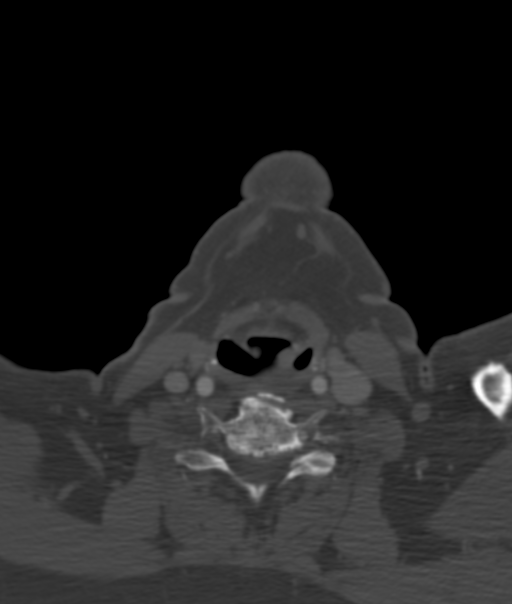
[im 175/349  soft-tissue]
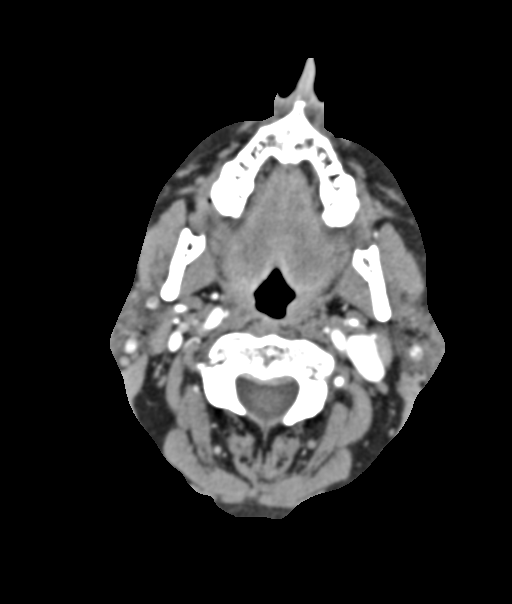
[im 233/349  bone]
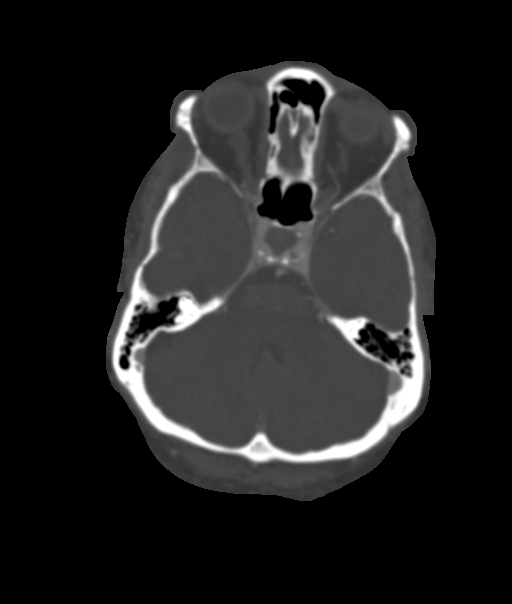
[im 291/349  soft-tissue]
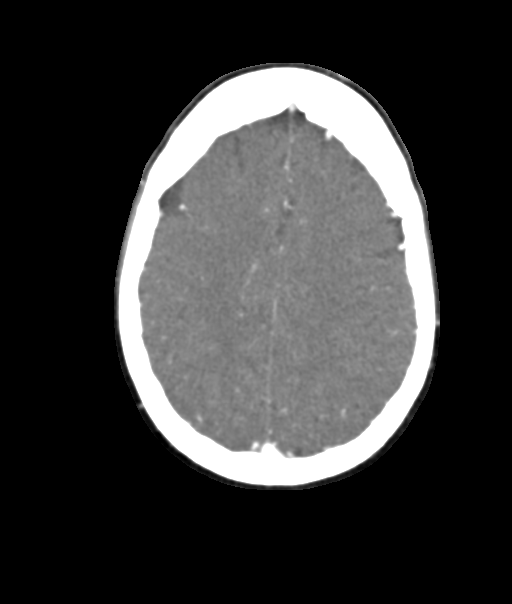

[5 of 33 positions shown; findings below may reference images not displayed]

FINDINGS: CT HEAD FINDINGS

BRAIN: No intraparenchymal hemorrhage, mass effect nor midline
shift. The ventricles and sulci are normal for age. Faint
supratentorial white matter hypodensities less than expected for
patient's age, though non-specific are most compatible with chronic
small vessel ischemic disease. No acute large vascular territory
infarcts. No abnormal extra-axial fluid collections. Basal cisterns
are patent.

VASCULAR: Trace calcific atherosclerosis of the carotid siphons.

SKULL: No skull fracture. Moderate temporomandibular osteoarthrosis.
No significant scalp soft tissue swelling.

SINUSES/ORBITS: The mastoid air-cells and included paranasal sinuses
are well-aerated.The included ocular globes and orbital contents are
non-suspicious.

OTHER: None.

CTA NECK

AORTIC ARCH: Normal appearance of the thoracic arch, normal branch
pattern. Mild calcific atherosclerosis aortic arch. The origins of
the innominate, left Common carotid artery and subclavian artery are
widely patent.

RIGHT CAROTID SYSTEM: Common carotid artery is widely patent. Normal
appearance of the carotid bifurcation without hemodynamically
significant stenosis by NASCET criteria. Normal appearance of the
internal carotid artery.

LEFT CAROTID SYSTEM: Common carotid artery is widely patent. Normal
appearance of the carotid bifurcation without hemodynamically
significant stenosis by NASCET criteria. Normal appearance of the
internal carotid artery.

VERTEBRAL ARTERIES:Left vertebral artery is dominant. Normal
appearance of the vertebral arteries, widely patent.

SKELETON: No acute osseous process though bone windows have not been
submitted. Multilevel moderate to severe cervical spondylosis.
Severe RIGHT C3-4 and LEFT C6-7 neural foraminal narrowing.

OTHER NECK: Soft tissues of the neck are nonacute though, not
tailored for evaluation. Status post LEFT submandibular gland
resection.

UPPER CHEST: RIGHT upper lobe atelectasis. No superior mediastinal
lymphadenopathy.

CTA HEAD

ANTERIOR CIRCULATION: Patent cervical internal carotid arteries,
petrous, cavernous and supra clinoid internal carotid arteries.
Patent anterior communicating artery. Patent anterior and middle
cerebral arteries.

No large vessel occlusion, significant stenosis, contrast
extravasation or aneurysm.

POSTERIOR CIRCULATION: Patent vertebral arteries, vertebrobasilar
junction and basilar artery, as well as main branch vessels. Patent
posterior cerebral arteries. Robust bilateral posterior
communicating artery's present.

No large vessel occlusion, significant stenosis, contrast
extravasation or aneurysm.

VENOUS SINUSES: Major dural venous sinuses are patent though not
tailored for evaluation on this angiographic examination.

ANATOMIC VARIANTS: Hypoplastic P1 segments.

DELAYED PHASE: No abnormal intracranial enhancement.

MIP images reviewed.
IMPRESSION: 1. Normal CT HEAD with and without contrast for age.
2. Negative CTA head and CTA neck.
3. Severe RIGHT C3-4 and LEFT C6-7 neural foraminal narrowing.
Aortic Atherosclerosis ([id number]).

## 2018-11-16 ENCOUNTER — Inpatient Hospital Stay: Admit: 2018-11-17 | Payer: MEDICARE | Attending: Cardiovascular Disease

## 2018-11-16 DIAGNOSIS — I639 Cerebral infarction, unspecified: Secondary | ICD-10-CM

## 2018-11-16 NOTE — Unmapped (Signed)
Device interrogation reviewed  ILR.  Sinus rhythm.  No events.

## 2018-11-29 NOTE — Unmapped (Signed)
Spoke with pt 6 mo follow-up scheduled,pt is agreeable to setting up appt for ct number provided

## 2018-12-17 ENCOUNTER — Inpatient Hospital Stay: Admit: 2018-12-19 | Payer: MEDICARE

## 2018-12-17 DIAGNOSIS — I639 Cerebral infarction, unspecified: Secondary | ICD-10-CM

## 2018-12-17 NOTE — Progress Notes (Signed)
Device interrogation reviewed. Loop recorder implanted for cryptogenic stroke. No arrhythmias detected.

## 2019-01-17 ENCOUNTER — Inpatient Hospital Stay: Admit: 2019-01-18 | Payer: MEDICARE

## 2019-01-17 DIAGNOSIS — I639 Cerebral infarction, unspecified: Secondary | ICD-10-CM

## 2019-01-17 NOTE — Progress Notes (Signed)
Device interrogation reviewed. Loop recorder implanted for cryptogenic stroke. No arrhythmias detected.

## 2019-02-17 ENCOUNTER — Inpatient Hospital Stay: Admit: 2019-02-20 | Discharge: 2019-02-21 | Payer: MEDICARE | Attending: Cardiovascular Disease

## 2019-02-17 DIAGNOSIS — I639 Cerebral infarction, unspecified: Secondary | ICD-10-CM

## 2019-02-17 NOTE — Progress Notes (Signed)
Device interrogation reviewed.  ILR.  Sinus rhythm.  No events.

## 2019-03-20 ENCOUNTER — Inpatient Hospital Stay: Admit: 2019-03-21 | Payer: MEDICARE | Attending: Cardiovascular Disease

## 2019-03-20 DIAGNOSIS — I639 Cerebral infarction, unspecified: Secondary | ICD-10-CM

## 2019-03-20 NOTE — Unmapped (Signed)
Device interrogation reviewed  ILR.  Sinus rhythm.  No events.

## 2019-04-13 ENCOUNTER — Inpatient Hospital Stay: Admit: 2019-04-13 | Discharge: 2019-04-17 | Payer: MEDICARE | Attending: Pulmonary Disease

## 2019-04-13 DIAGNOSIS — R911 Solitary pulmonary nodule: Secondary | ICD-10-CM

## 2019-04-17 NOTE — Telephone Encounter (Signed)
Spoke with patient regarding upcoming appt. Pt consented to telephone visit

## 2019-04-19 ENCOUNTER — Ambulatory Visit: Admit: 2019-04-19 | Payer: MEDICARE | Attending: Pulmonary Disease

## 2019-04-19 DIAGNOSIS — R911 Solitary pulmonary nodule: Secondary | ICD-10-CM

## 2019-04-19 NOTE — Unmapped (Signed)
History of Present Illness:      Joanna Lopez is a 68 y.o. female.    HPI    I saw Mrs Joanna Lopez last time in October 2015  She had another CT chest done - was seen by her PCP who called me with results  She feels the same  No major changes since last time I saw he  Patient denies hemoptysis, increased shortness of breath, chest pains, headaches, nausea, vomiting, diarrhea  No fevers or chills. 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 The patient provided verbal consent for an over the phone visit.    I spent 15 to 19 minutes  on this call conducting an interview, performing a limited exam by phone, and educating the patient on my assessment and plan.  Doing well  No issues since last I saw her  No arhythmias on the loop recorder  Review of Systems   Constitutional: Positive for fatigue.   HENT: Positive for postnasal drip.    Respiratory: Positive for cough, shortness of breath and wheezing.    Gastrointestinal: Positive for bloating and diarrhea.   Musculoskeletal: Positive for arthralgias.   Neurological: Positive for tremors.   Hematological: Bruises/bleeds easily.   Psychiatric/Behavioral: The patient is nervous/anxious.    All other systems reviewed and are negative.      Objective:   There were no vitals taken for this visit.    Physical Exam    This was a telephone visit, no physical exam performed      Exam: CT CHEST WO CONTRAST dated 08/14/2016 10:17 AM EDT  ??  CLINICAL HISTORY:  lung nodules;   ??  COMPARISON: February 07, 2014  ??  TECHNIQUE: Multidetector  CT imaging was obtained through the chest in the supine position without intravenous contrast. The images were reconstructed with 5 and 1 mm collimation using a 38 cm field of view.  ??  FINDINGS: The lung windows show that the central airways are patent. Small calcified nodules reflect healed granulomatous infection.  ??  The well-defined noncalcified nodule in the medial segment right middle lobe measures approximately 2.1 x 1.8 cm on series 5 image 214, previously 1.7 x 1.5 cm on February 07, 2014. The central portion of this nodule is fluid attenuation.  ??  The irregular ill-defined subsolid nodule in the superior lingular subsegment of the left upper lobe on series 5 image 193 measures approximately 0.9 x 0.4 cm.  ??  A tiny subpleural nodule at the left apex laterally on series 5 image 73 is unchanged. There are no new lesions.  ??  The mediastinal windows show granulomatous calcification but otherwise no lymphadenopathy.  ??  No coronary artery calcifications are visible. The caliber of the great vessels is normal.  ??  Limited noncontrast imaging through the  upper abdomen shows multiple low-attenuation liver lesions, similar to prior studies, likely cysts. A fluid attenuation lesion centrally in the left kidney is larger compared to 2014 but likely a peripelvic cyst.  ??  The bone windows show no suspicious lesions.  ??  ??  IMPRESSION:  ??  While predominantly fluid attenuation, the right middle lobe nodule is mildly increased in size compared to 3 years previously. This is most likely a benign lesion such as granuloma or hamartoma. The fluid attenuation makes carcinoid tumor less likely. Nevertheless, due to the interval growth a follow-up CT in 12 months should be considered.  ??  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 This can  be followed at the same time.  ??  Report Verified by: Rick Duff, M.D. at 08/14/2016 12:53 PM EDT      EXAM: CT CHEST WO CONTRAST   ??  INDICATION: Abnormal xray - lung nodule, < 1 cm, mod-high risk,   ??  COMPARISON: CT of the chest from 10/24/2018.  ??  TECHNIQUE: Multidetector CT imaging was obtained through the chest in the supine position without intravenous contrast. Additional axial MIP images were reconstructed.    ??  FINDINGS:   Medical devices: Loop recorder in the anterior left chest wall, unchanged.  ??  Airways & lungs: The central airways are patent. Stable postsurgical changes related to right middle lobectomy. Previously demonstrated pulmonary nodules are stable including the subsolid nodule in the lateral left upper lobe. No new or enlarging pulmonary nodules. Upper lobe predominant emphysema, unchanged.  ??  Pleura: No pleural effusions or pneumothorax.  ??  Lower Neck: Unremarkable.  ??  Heart: The heart is normal in size.  ??  Vascular structures: Aorta is normal in caliber. Main pulmonary artery is normal in caliber.  ??  Mediastinum and hila: No pathologically enlarged lymph nodes.  ??  Chest wall and axilla: Unremarkable.  ??  Upper abdomen: Low-attenuation lesions in the liver, unchanged and most likely cysts.  ??  Musculoskeletal: No suspicious osteolytic or osteoblastic lesions.  ??  ??  IMPRESSION:  ??  1.  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 Recommend follow-up low-dose chest CT in 12 months.  ??  Report Verified by: Essie Hart, MD at 04/13/2019 3:03 PM EST    Assessment:     Lung nodules, carcinoid, s/p resection VATS RML  Lung nodule LUL  ??  Plan:   ??  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  Flu shot recommended every year  ??

## 2019-04-20 ENCOUNTER — Inpatient Hospital Stay: Admit: 2019-04-21 | Payer: MEDICARE | Attending: Cardiovascular Disease

## 2019-04-20 DIAGNOSIS — I639 Cerebral infarction, unspecified: Secondary | ICD-10-CM

## 2019-04-20 NOTE — Progress Notes (Signed)
Device interrogation reviewed  Normal device function.  No symptoms or arrhythmia.

## 2019-05-19 NOTE — Unmapped (Signed)
Spoke with patient regarding lack of communication from [employer] equipment. Patient stated that she had noticed her equipment wasn't working correctly and called [employer]. [employer] determined she needed a new box and is mailing this out. Patient stated she was told this could take 7-10 days to receive. Patient stated that she will plug in new equipment and send in a manual transmission once received.

## 2019-05-22 ENCOUNTER — Inpatient Hospital Stay: Admit: 2019-05-22 | Payer: MEDICARE | Attending: Cardiovascular Disease

## 2019-05-22 DIAGNOSIS — I639 Cerebral infarction, unspecified: Secondary | ICD-10-CM

## 2019-05-22 NOTE — Unmapped (Signed)
Device interrogation reviewed  Normal device function.  No symptoms or arrhythmia.

## 2019-06-22 ENCOUNTER — Inpatient Hospital Stay: Admit: 2019-06-22 | Payer: MEDICARE | Attending: Cardiovascular Disease

## 2019-06-22 DIAGNOSIS — I639 Cerebral infarction, unspecified: Secondary | ICD-10-CM

## 2019-06-22 NOTE — Unmapped (Signed)
Device interrogation reviewed.  ILR.  Sinus rhythm.  No events.

## 2019-07-23 ENCOUNTER — Inpatient Hospital Stay: Admit: 2019-07-24 | Payer: MEDICARE

## 2019-07-23 DIAGNOSIS — I639 Cerebral infarction, unspecified: Secondary | ICD-10-CM

## 2019-07-24 NOTE — Unmapped (Signed)
Device interrogation reviewed  NSR  No arrhythmias

## 2019-08-23 ENCOUNTER — Inpatient Hospital Stay: Admit: 2019-08-23 | Payer: MEDICARE | Attending: Cardiovascular Disease

## 2019-08-23 DIAGNOSIS — I639 Cerebral infarction, unspecified: Secondary | ICD-10-CM

## 2019-08-23 NOTE — Progress Notes (Signed)
Device interrogation reviewed  Normal device function.  No symptoms or arrhythmia.

## 2019-09-23 ENCOUNTER — Inpatient Hospital Stay: Admit: 2019-09-25 | Payer: MEDICARE

## 2019-09-23 DIAGNOSIS — I639 Cerebral infarction, unspecified: Secondary | ICD-10-CM

## 2019-09-25 NOTE — Unmapped (Signed)
Device interrogation reviewed. Loop recorder implanted for cryptogenic stroke. No arrhythmias detected.

## 2019-10-24 ENCOUNTER — Inpatient Hospital Stay: Admit: 2019-10-24 | Payer: MEDICARE | Attending: Cardiovascular Disease

## 2019-10-24 DIAGNOSIS — I639 Cerebral infarction, unspecified: Secondary | ICD-10-CM

## 2019-10-24 NOTE — Unmapped (Signed)
Device interrogation reviewed  Normal device function.  No symptoms or arrhythmia.

## 2019-12-04 ENCOUNTER — Inpatient Hospital Stay: Admit: 2019-12-08 | Discharge: 2019-12-09 | Payer: MEDICARE | Attending: Cardiovascular Disease

## 2019-12-04 DIAGNOSIS — I639 Cerebral infarction, unspecified: Secondary | ICD-10-CM

## 2020-01-04 ENCOUNTER — Inpatient Hospital Stay: Admit: 2020-01-25 | Payer: MEDICARE | Attending: Cardiovascular Disease

## 2020-01-04 DIAGNOSIS — I639 Cerebral infarction, unspecified: Secondary | ICD-10-CM

## 2020-02-04 ENCOUNTER — Inpatient Hospital Stay: Admit: 2020-03-26 | Payer: MEDICARE

## 2020-02-04 DIAGNOSIS — Z4509 Encounter for adjustment and management of other cardiac device: Secondary | ICD-10-CM

## 2020-03-06 ENCOUNTER — Inpatient Hospital Stay: Admit: 2020-05-14 | Payer: MEDICARE | Attending: Cardiovascular Disease

## 2020-03-06 DIAGNOSIS — I639 Cerebral infarction, unspecified: Secondary | ICD-10-CM

## 2020-03-19 ENCOUNTER — Inpatient Hospital Stay: Admit: 2020-03-19 | Payer: MEDICARE | Attending: Pulmonary Disease

## 2020-03-19 ENCOUNTER — Ambulatory Visit: Admit: 2020-03-19 | Payer: MEDICARE | Attending: Pulmonary Disease

## 2020-03-19 ENCOUNTER — Ambulatory Visit: Payer: MEDICARE

## 2020-03-19 DIAGNOSIS — R911 Solitary pulmonary nodule: Secondary | ICD-10-CM

## 2020-03-19 NOTE — Unmapped (Signed)
History of Present Illness:      Joanna Lopez is a 69 y.o. female.    HPI    I saw Mrs Joanna Lopez last time in October 2015  She had another CT chest done - was seen by her PCP who called me with results  She feels the same  No major changes since last time I saw he  Patient denies hemoptysis, increased shortness of breath, chest pains, headaches, nausea, vomiting, diarrhea  No fevers or chills. 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 The patient provided verbal consent for an over the phone visit.    I spent 15 to 19 minutes  on this call conducting an interview, performing a limited exam by phone, and educating the patient on my assessment and plan.  Doing well  No issues since last I saw her  No arhythmias on the loop recorder    03/19/2020  Doing well  Happy  Had ct chest today    Review of Systems   Constitutional: Positive for fatigue.   HENT: Positive for postnasal drip.    Respiratory: Positive for cough, shortness of breath and wheezing.    Gastrointestinal: Positive for bloating and diarrhea.   Musculoskeletal: Positive for arthralgias.   Neurological: Positive for tremors.   Hematological: Bruises/bleeds easily.   Psychiatric/Behavioral: The patient is nervous/anxious.    All other systems reviewed and are negative.      Objective:   Blood pressure 106/76, pulse 79, height 5' 6 (1.676 m), weight 195 lb (88.5 kg), SpO2 94 %.    Physical Exam    Nursing note and vitals reviewed.    Constitutional: Oriented to person, place, and time. Appears well-developed and well-nourished. No distress.   HENT: pupils equal, no scleral icterus, on conjunctivitis, no enlarged thyroid  Head: Normocephalic.    Ears: External ear normal.   Mouth/Throat: Oropharynx is clear and moist. No oropharyngeal exudate.   Eyes: Conjunctivae are normal. Pupils are equal, round, and reactive to light. Right eye exhibits no discharge. Left eye exhibits no discharge.   Neck: Normal range of motion. Neck supple. No JVD present. No tracheal deviation present. No thyromegaly present.   Cardiovascular: Normal rate, regular rhythm, normal heart sounds and intact distal pulses. Exam reveals no gallop and no friction rub.   No murmur heard.   Pulmonary/Chest: Effort normal and breath sounds normal. No stridor. No respiratory distress. No wheezes. No rales. Exhibits no tenderness.   Abdominal:Exhibits no distension and no mass. There is no tenderness. There is no rebound and no guarding.   Musculoskeletal: exhibits no edema and no tenderness.   Neurological: alert and oriented to person, place, and time. normal reflexes. No cranial nerve deficit. Coordination normal.   Skin: Warm and dry. Not diaphoretic. No erythema.   Psychiatric:  Normal mood and affect. Behavior is normal. Judgment and thought content normal.       Exam: CT CHEST WO CONTRAST dated 08/14/2016 10:17 AM EDT  ??  CLINICAL HISTORY: lung nodules;   ??  COMPARISON: February 07, 2014  ??  TECHNIQUE: Multidetector  CT imaging was obtained through the chest in the supine position without intravenous contrast. The images were reconstructed with 5 and 1 mm collimation using a 38 cm field of view.  ??  FINDINGS: The lung windows show that the central airways are patent. Small calcified nodules reflect healed granulomatous infection.  ??  The well-defined noncalcified nodule in the medial segment right middle lobe measures approximately 2.1 x 1.8 cm on series 5 image 214, previously  1.7 x 1.5 cm on February 07, 2014. The central portion of this nodule is fluid attenuation.  ??  The irregular ill-defined subsolid nodule in the superior lingular subsegment of the left upper lobe on series 5 image 193 measures approximately 0.9 x 0.4 cm.  ??  A tiny subpleural nodule at the left apex laterally on series 5 image 73 is unchanged. There are no new lesions.  ??  The mediastinal windows show granulomatous calcification but otherwise no lymphadenopathy.  ??  No coronary artery calcifications are visible. The caliber of the great vessels is normal.  ??  Limited noncontrast imaging through the upper abdomen shows multiple low-attenuation liver lesions, similar to prior studies, likely cysts. A fluid attenuation lesion centrally in the left kidney is larger compared to 2014 but likely a peripelvic cyst.  ??  The bone windows show no suspicious lesions.  ??  ??  IMPRESSION:  ??  While predominantly fluid attenuation, the right middle lobe nodule is mildly increased in size compared to 3 years previously. This is most likely a benign lesion such as granuloma or hamartoma. The fluid attenuation makes carcinoid tumor less likely. Nevertheless, due to the interval growth a follow-up CT in 12 months should be considered.  ??  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 This can be followed at the same time.  ??  Report Verified by: Rick Duff, M.D. at 08/14/2016 12:53 PM EDT      EXAM: CT CHEST WO CONTRAST   ??  INDICATION: Abnormal xray - lung nodule, < 1 cm, mod-high risk,   ??  COMPARISON: CT of the chest from 10/24/2018.  ??  TECHNIQUE: Multidetector CT imaging was obtained through the chest in the supine position without intravenous contrast. Additional axial MIP images were reconstructed.    ??  FINDINGS:   Medical devices: Loop recorder in the anterior left chest wall, unchanged.  ??  Airways & lungs: The central  airways are patent. Stable postsurgical changes related to right middle lobectomy. Previously demonstrated pulmonary nodules are stable including the subsolid nodule in the lateral left upper lobe. No new or enlarging pulmonary nodules. Upper lobe predominant emphysema, unchanged.  ??  Pleura: No pleural effusions or pneumothorax.  ??  Lower Neck: Unremarkable.  ??  Heart: The heart is normal in size.  ??  Vascular structures: Aorta is normal in caliber. Main pulmonary artery is normal in caliber.  ??  Mediastinum and hila: No pathologically enlarged lymph nodes.  ??  Chest wall and axilla: Unremarkable.  ??  Upper abdomen: Low-attenuation lesions in the liver, unchanged and most likely cysts.  ??  Musculoskeletal: No suspicious osteolytic or osteoblastic  lesions.  ??  ??  IMPRESSION:  ??  1.  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 Recommend follow-up low-dose chest CT in 12 months.  ??  Report Verified by: Essie Hart, MD at 04/13/2019 3:03 PM EST    EXAM: CT CHEST WO CONTRAST     INDICATION: Lung nodule, > 8mm     COMPARISON: CT chest dated April 13, 2019, October 24, 2018, May 31, 2018, CT chest dated February 07, 2014.     TECHNIQUE: Multidetector CT imaging was obtained through the chest in the supine position without intravenous contrast. Additional axial MIP images were reconstructed. ??     FINDINGS:   Medical devices: Left apical there is seen in the subcutaneous tissue of the left anterior chest wall.     Airways & lungs: Stable postsurgical changes of right middle lobectomy. Central airways are patent. Mild apical predominant emphysema. Few scattered calcified granulomas. Seen is spiculated nodule in the left upper lobe with tiny central area of lucency measuring approximately 9 x 6 mm (image #184, series 4), equivocally increase in size compared to study dated May 10, 2019 and definitely increased in size compared to  study dated February 07, 2014. Additional subsolid nodule appears unchanged. No new pulmonary nodule. Bibasilar atelectasis. Lungs are otherwise clear.     Pleura: No pleural effusions or pneumothorax.     Lower Neck: Unremarkable.     Heart: The heart is normal in size.     Vascular structures: Mild calcification of aorta and branch vessels. The vascular structures otherwise unremarkable. Main pulmonary artery is normal in caliber.     Mediastinum and hila: No pathologically enlarged lymph nodes. Calcified right hilar and mediastinal lymph nodes.     Chest wall and axilla: Unremarkable.     Upper abdomen: Again seen are multiple hepatic cyst.     Musculoskeletal: No suspicious osteolytic or osteoblastic lesions.     Impression   IMPRESSION:     1. ??Subtle increase in size of subsolid nodule in the left upper lobe measuring 9 x 6 mm. Follow-up is suggested in 6 months to ensure stability.   2. ??Mild emphysema.       Report Verified by: Lana Fish, MD at 03/19/2020 2:15 PM EST         Assessment:     Lung nodules, carcinoid, s/p resection VATS RML  Lung nodule LUL  ??  Plan:   ??  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  Flu shot recommended every year, covid shots recommended  ??

## 2020-04-06 ENCOUNTER — Inpatient Hospital Stay: Admit: 2020-06-18 | Discharge: 2020-06-20 | Payer: MEDICARE

## 2020-04-06 DIAGNOSIS — Z4502 Encounter for adjustment and management of automatic implantable cardiac defibrillator: Secondary | ICD-10-CM

## 2020-05-07 ENCOUNTER — Inpatient Hospital Stay: Admit: 2020-06-28 | Payer: MEDICARE | Attending: Cardiovascular Disease

## 2020-05-07 DIAGNOSIS — I639 Cerebral infarction, unspecified: Secondary | ICD-10-CM

## 2020-06-07 ENCOUNTER — Inpatient Hospital Stay: Admit: 2020-07-25 | Payer: MEDICARE | Attending: Cardiovascular Disease

## 2020-06-07 DIAGNOSIS — I639 Cerebral infarction, unspecified: Secondary | ICD-10-CM

## 2020-07-08 ENCOUNTER — Inpatient Hospital Stay: Admit: 2020-08-21 | Discharge: 2020-08-22 | Payer: MEDICARE

## 2020-07-08 DIAGNOSIS — I639 Cerebral infarction, unspecified: Secondary | ICD-10-CM

## 2020-07-30 ENCOUNTER — Inpatient Hospital Stay: Admit: 2020-07-30 | Attending: Cardiovascular Disease

## 2020-08-08 ENCOUNTER — Inpatient Hospital Stay: Admit: 2020-09-17 | Payer: MEDICARE | Attending: Cardiovascular Disease

## 2020-08-08 DIAGNOSIS — Z4509 Encounter for adjustment and management of other cardiac device: Secondary | ICD-10-CM

## 2020-08-30 ENCOUNTER — Inpatient Hospital Stay: Admit: 2020-08-30

## 2020-09-02 NOTE — Unmapped (Addendum)
Returned call to pt,questioned if pt would like to see physician then complete ct or have ct then follow up  Pt would prefer ct first,confirmed ct already scheduled for May 10 at 12:20  Pt scheduled for follow-up on May 26 at 9:10

## 2020-09-02 NOTE — Unmapped (Signed)
Pt called requesting to speak with staff regarding a CT scan     Pt states she typically completes a CT scan prior to appointments with Dr. Damaris Hippo but her LOV notes state she is not due for another scan until 09/2020. Pt is requesting to know if she should have scheduled that prior to her upcoming appointment tomorrow 09/03/2020 or wait until May.     Pt requests a call back at [phone]

## 2020-09-03 ENCOUNTER — Ambulatory Visit: Payer: MEDICARE | Attending: Pulmonary Disease

## 2020-09-08 ENCOUNTER — Inpatient Hospital Stay: Admit: 2020-10-15 | Payer: MEDICARE

## 2020-09-08 DIAGNOSIS — I639 Cerebral infarction, unspecified: Secondary | ICD-10-CM

## 2020-09-17 ENCOUNTER — Inpatient Hospital Stay: Admit: 2020-09-17 | Payer: MEDICARE | Attending: Pulmonary Disease

## 2020-09-17 ENCOUNTER — Ambulatory Visit: Payer: MEDICARE | Attending: Pulmonary Disease

## 2020-09-17 DIAGNOSIS — R911 Solitary pulmonary nodule: Secondary | ICD-10-CM

## 2020-09-30 ENCOUNTER — Inpatient Hospital Stay: Admit: 2020-09-30

## 2020-10-03 ENCOUNTER — Ambulatory Visit: Admit: 2020-10-03 | Payer: MEDICARE | Attending: Pulmonary Disease

## 2020-10-03 DIAGNOSIS — R911 Solitary pulmonary nodule: Secondary | ICD-10-CM

## 2020-10-03 NOTE — Unmapped (Signed)
Pt needed an appt with sleep physician  Spoke with Judeth Cornfield able to see pt on June 20 at 3:15 with Polo Riley

## 2020-10-03 NOTE — Unmapped (Signed)
History of Present Illness:      Joanna Lopez is a 70 y.o. female.    HPI    I saw Joanna Lopez last time in October 2015  She had another CT chest done - was seen by her PCP who called me with results  She feels the same  No major changes since last time I saw he  Patient denies hemoptysis, increased shortness of breath, chest pains, headaches, nausea, vomiting, diarrhea  No fevers or chills. 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 The patient provided verbal consent for an over the phone visit.    I spent 15 to 19 minutes  on this call conducting an interview, performing a limited exam by phone, and educating the patient on my assessment and plan.  Doing well  No issues since last I saw her  No arhythmias on the loop recorder    03/19/2020  Doing well  Happy  Had ct chest today    10/03/2020  Doing well she will go to [place] and [place] on a cruise in July  Enjoys life  Patient denies hemoptysis, increased shortness of breath, chest pains, headaches, nausea, vomiting, diarrhea  No fevers or chills. No new skin rashes    Review of Systems   Constitutional: Positive for fatigue.   HENT: Positive for postnasal drip.    Respiratory: Positive for cough, shortness of breath and wheezing.    Gastrointestinal: Positive for bloating and diarrhea.   Musculoskeletal: Positive for arthralgias.   Neurological: Positive for tremors.   Hematological: Bruises/bleeds easily.   Psychiatric/Behavioral:  The patient is nervous/anxious.    All other systems reviewed and are negative.      Objective:   Blood pressure 122/80, pulse 73, height 5' 6.5 (1.689 m), weight 195 lb 9.6 oz (88.7 kg), SpO2 92 %.    Physical Exam    Nursing note and vitals reviewed.   Constitutional: Oriented to person, place, and time. Appears well-developed and well-nourished. No distress.   HENT: pupils equal, no scleral icterus, on conjunctivitis, no enlarged thyroid  Head: Normocephalic.    Ears: External ear normal.   Mouth/Throat: Oropharynx is clear and moist. No oropharyngeal exudate.   Eyes: Conjunctivae are normal. Pupils are equal, round, and reactive to light. Right eye exhibits no discharge. Left eye exhibits no discharge.   Neck: Normal range of motion. Neck supple. No JVD present. No tracheal deviation present. No thyromegaly present.   Cardiovascular: Normal rate, regular rhythm, normal heart sounds and intact distal pulses. Exam reveals no gallop and no friction rub.   No murmur heard.   Pulmonary/Chest: Effort normal and breath sounds normal. No stridor. No respiratory distress. No wheezes. No rales. Exhibits no tenderness.   Abdominal:Exhibits no distension and no mass. There is no tenderness.  There is no rebound and no guarding.   Musculoskeletal: exhibits no edema and no tenderness.   Neurological: alert and oriented to person, place, and time. normal reflexes. No cranial nerve deficit. Coordination normal.   Skin: Warm and dry. Not diaphoretic. No erythema.   Psychiatric: Normal mood and affect. Behavior is normal. Judgment and thought content normal.       Exam: CT CHEST WO CONTRAST dated 08/14/2016 10:17 AM EDT  ??  CLINICAL HISTORY: lung nodules;   ??  COMPARISON: February 07, 2014  ??  TECHNIQUE: Multidetector  CT imaging was obtained through the chest in the supine position without intravenous contrast. The images were reconstructed with 5 and 1 mm collimation using a 38 cm field of view.  ??  FINDINGS: The lung windows  show that the central airways are patent. Small calcified nodules reflect healed granulomatous infection.  ??  The well-defined noncalcified nodule in the medial segment right middle lobe measures approximately 2.1 x 1.8 cm on series 5 image 214, previously 1.7 x 1.5 cm on February 07, 2014. The central portion of this nodule is fluid attenuation.  ??  The irregular ill-defined subsolid nodule in the superior lingular subsegment of the left upper lobe on series 5 image 193 measures approximately 0.9 x 0.4 cm.  ??  A tiny subpleural nodule at the left apex laterally on series 5 image 73 is unchanged. There are no new lesions.  ??  The mediastinal windows show granulomatous calcification but otherwise no lymphadenopathy.  ??  No coronary artery calcifications are visible. The caliber of the great vessels is normal.  ??  Limited noncontrast imaging through the upper abdomen shows multiple low-attenuation liver lesions, similar to prior studies, likely cysts. A fluid attenuation lesion centrally in the left kidney is larger compared to 2014 but likely a peripelvic cyst.  ??  The bone windows show no suspicious lesions.  ??  ??  IMPRESSION:  ??  While predominantly fluid attenuation, the right middle lobe nodule is mildly increased in size compared to 3 years previously. This is most likely a benign lesion such as granuloma or hamartoma. The fluid attenuation makes carcinoid tumor less likely. Nevertheless, due to the interval growth a follow-up CT in 12 months should be considered.  ??  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 This can be followed at the same time.  ??  Report Verified by: Rick Duff, M.D. at 08/14/2016 12:53 PM EDT      EXAM: CT CHEST WO CONTRAST   ??  INDICATION: Abnormal xray - lung nodule, < 1 cm, mod-high risk,   ??  COMPARISON: CT of the chest from 10/24/2018.  ??  TECHNIQUE: Multidetector CT imaging was  obtained through the chest in the supine position without intravenous contrast. Additional axial MIP images were reconstructed.    ??  FINDINGS:   Medical devices: Loop recorder in the anterior left chest wall, unchanged.  ??  Airways & lungs: The central airways are patent. Stable postsurgical changes related to right middle lobectomy. Previously demonstrated pulmonary nodules are stable including the subsolid nodule in the lateral left upper lobe. No new or enlarging pulmonary nodules. Upper lobe predominant emphysema, unchanged.  ??  Pleura: No pleural effusions or pneumothorax.  ??  Lower Neck: Unremarkable.  ??  Heart: The heart is normal in size.  ??  Vascular structures: Aorta is normal in caliber. Main pulmonary  artery is normal in caliber.  ??  Mediastinum and hila: No pathologically enlarged lymph nodes.  ??  Chest wall and axilla: Unremarkable.  ??  Upper abdomen: Low-attenuation lesions in the liver, unchanged and most likely cysts.  ??  Musculoskeletal: No suspicious osteolytic or osteoblastic lesions.  ??  ??  IMPRESSION:  ??  1.  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 Recommend follow-up low-dose chest CT in 12 months.  ??  Report Verified by: Essie Hart, MD at 04/13/2019 3:03 PM EST    EXAM: CT CHEST WO CONTRAST     INDICATION: Lung nodule, > 8mm     COMPARISON: CT chest dated April 13, 2019, October 24, 2018, May 31, 2018, CT chest dated February 07, 2014.     TECHNIQUE: Multidetector CT imaging was obtained through the chest in the supine position without intravenous contrast. Additional axial MIP images were reconstructed. ??     FINDINGS:   Medical devices: Left apical there is seen in the subcutaneous tissue of the left anterior chest wall.     Airways & lungs: Stable postsurgical changes of right middle lobectomy. Central airways are patent. Mild apical predominant emphysema. Few scattered calcified granulomas.  Seen is spiculated nodule in the left upper lobe with tiny central area of lucency measuring approximately 9 x 6 mm (image #184, series 4), equivocally increase in size compared to study dated May 10, 2019 and definitely increased in size compared to study dated February 07, 2014. Additional subsolid nodule appears unchanged. No new pulmonary nodule. Bibasilar atelectasis. Lungs are otherwise clear.     Pleura: No pleural effusions or pneumothorax.     Lower Neck: Unremarkable.     Heart: The heart is normal in size.     Vascular structures: Mild calcification of aorta and branch vessels. The vascular structures otherwise unremarkable. Main pulmonary artery is normal in caliber.     Mediastinum and hila: No pathologically enlarged lymph nodes. Calcified right hilar and mediastinal lymph nodes.     Chest wall and axilla: Unremarkable.     Upper abdomen: Again seen are multiple hepatic cyst.     Musculoskeletal: No suspicious osteolytic or osteoblastic lesions.     Impression   IMPRESSION:     1. ??Subtle increase in size of subsolid nodule in the left upper lobe measuring 9 x 6 mm. Follow-up is suggested in 6 months to ensure stability.   2. ??Mild emphysema.       Report Verified by: Lana Fish, MD at 03/19/2020 2:15 PM EST         Assessment:     Lung nodules, carcinoid, s/p resection VATS RML  Lung nodule LUL  ??  Plan:   ??  Patient with a large nodule RML that on path was typical carcinoid  The LUL nodule is maybe slightly denser, images reviewed, discussed with patient, still question of adenocarcinoma  Will repeat ct chest in 6 months - if larger, she will need to see Dr Harriette Bouillon again  was seen by Neuro for cryptogenic stroke  PFTs reviewed  ??  Flu shot recommended every year, covid shots recommended  ??

## 2020-10-09 ENCOUNTER — Inpatient Hospital Stay: Admit: 2020-10-24 | Payer: MEDICARE

## 2020-10-09 DIAGNOSIS — I639 Cerebral infarction, unspecified: Secondary | ICD-10-CM

## 2020-10-28 ENCOUNTER — Ambulatory Visit: Admit: 2020-10-28 | Discharge: 2020-10-28 | Payer: MEDICARE | Attending: Acute Care

## 2020-10-28 DIAGNOSIS — R400 Somnolence: Secondary | ICD-10-CM

## 2020-10-28 NOTE — Unmapped (Addendum)
Initial Outpatient Pulmonary/Sleep Medicine Consultation     REASON FOR PULMONARY/SLEEP CONSULT:  Chief Complaint   Patient presents with   ??? New Patient Visit/ Consultation     Sleep consult- had a sleep study and was on Cpap and then Dr. Doylene Canard did a procedure and no longer needed cpap.   Currently has lung cancer and has had a lobe removed. May have to have another surgery in the fall.      ??? Fatigue     Sunday she was at church and got fidgety and then realized she was extremely fatigue and she had to go home and take a nap. If she doesn't do that she has muscle issues. Hard to move legs and stay conscious.     ??? Sleep Issues     Normally about 10 hours per night. Some times can get a way with less but she will eventually have to take a nap or catch up on the missed sleep.        HPI  Joanna Lopez is a 70 y.o. female who presents for New Patient Visit/ Consultation (Sleep consult- had a sleep study and was on Cpap and then Dr. Doylene Canard did a procedure and no longer needed cpap. /Currently has lung cancer and has had a lobe removed. May have to have another surgery in the fall. /), Fatigue (Sunday she was at church and got fidgety and then realized she was extremely fatigue and she had to go home and take a nap. If she doesn't do that she has muscle issues. Hard to move legs and stay conscious.  ), and Sleep Issues (Normally about 10 hours per night. Some times can get a way with less but she will eventually have to take a nap or catch up on the missed sleep. )  .    Goes to bed: 11p  How long to fall asleep:  Reads for an hour and then falls asleep  Get up during night before morning: 2-3, Nocturia:  yes  Wake up for day:  9 am  Feels refreshed:  Yes  Naps 4-5 times a week around 3pm for 2hrs    Day time somnolence: Yes   Fall asleep in meetings/work:  Yes    Driving:  No   Watching TV: Yes    Problems with concentrations/Memory:  Yes     Unusual sleep behaviors:   Yes   Hypnangogia or hypnopompia:    No   Cataplexy:  :   No   Sleep paralysis:   No   Leg kicking:  No   RLS symptoms:   No    PAST MEDICAL HISTORY  Past Medical History:   Diagnosis Date   ??? Anxiety    ??? Basal cell cancer    ??? Hyperlipidemia    ??? Hypertension    ??? Lung nodule    ??? OSA (obstructive sleep apnea)    ??? RLS (restless legs syndrome)        SURGICAL HISTORY  Past Surgical History:   Procedure Laterality Date   ??? APPENDECTOMY     ??? COLONOSCOPY N/A 01/23/2013    Procedure: COLONOSCOPY WITH MAC;  Surgeon: Lurene Shadow, MD;  Location: [facility] ENDOSCOPY;  Service: Gastroenterology;  Laterality: N/A;   ??? FLEXIBLE BRONCHOSCOPY W/ UPPER ENDOSCOPY N/A 09/17/2016    Procedure: BRONCHOSCOPY SUPER D WITH FLUORO AND BIOPSY-EBUS/NAVI;  Surgeon: Johnny Bridge, MD;  Location: [facility] ENDOSCOPY;  Service: Pulmonary;  Laterality: N/A;   ??? LOOP  IMPLANT IMPLANTATION N/A 04/07/2017    Procedure: Loop  Implantation;  Surgeon: Tennis Must, MD;  Location: [facility] ELECTROPHYSIOLOGY LAB;  Service: Electrophysiology;  Laterality: N/A;   ??? submastoid gland removal     ??? THORACOSCOPY Right 11/23/2016    Procedure: RIGHT SIDED VATS MIDDLE LOBE LOBECTOMY,HILAR MEDIASTINAL LYMPH NODE DISSECTION;  Surgeon: Janett Billow, MD;  Location: [facility] OR;  Service: Thoracic;  Laterality: Right;   ??? TONSILLECTOMY     ??? UVULOPALATOPHARYNGOPLASTY         CURRENT HOME MEDICATIONS  Current Outpatient Medications   Medication Sig   ??? acetaminophen Take 3 tablets (975 mg total) by mouth every 6 hours as needed.   ??? aspirin Take 1 tablet (325 mg total) by mouth daily.   ??? aspirin-acetaminophen-caffeine Take 2 tablets by mouth every 6 hours as needed for Pain.   ??? atorvastatin Take 1 tablet (80 mg total) by mouth at bedtime.   ??? calcium carbonate (CALCIUM 300 ORAL) Take by mouth.   ??? dicyclomine Take 20 mg by mouth 4 times a day with meals and at bedtime.   ??? fluticasone propionate Use 2 sprays into each nostril if needed.   ??? hydroCHLOROthiazide Take 1 tablet by mouth daily.   ???  lipase-protease-amylase Take 2 capsules po with each meal. (Patient taking differently: Take 2 capsules po with each meal.  Taking PRN Indications: exocrine pancreatic insufficiency)   ??? lisinopriL Take 10 mg by mouth daily. 10 mg (half tablet) daily   ??? melatonin Take 3 mg by mouth at bedtime. PRN for sleep   ??? multivitamin Take 1 tablet by mouth daily.   ??? PARoxetine Take 1 tablet by mouth daily.   ??? roPINIRole Take 1 tablet (0.25 mg total) by mouth daily with dinner. Take one tablet (0.25 mg) 1-2 hours before bedtime, for three days, then take 2 tablets (0.5 mg) 1-2 hours before bedtime for four days, then take 4 tablets (1 mg) 1-2 hours before bedtime thereafter     No current facility-administered medications for this visit.       ALLERGIES  Allergies   Allergen Reactions   ??? Penicillins Anaphylaxis     Other reaction(s): Facial Swelling  Has patient had a PCN reaction causing immediate rash, facial/tongue/throat swelling, SOB or lightheadedness with hypotension: Yes  Has patient had a PCN reaction causing severe rash involving mucus membranes or skin necrosis: Yes  Has patient had a PCN reaction that required hospitalization: No  Has patient had a PCN reaction occurring within the last 10 years: No  If all of the above answers are NO, then may proceed with Cephalosporin use.   ??? Formaldehyde Analogues    ??? Promethazine Hcl      Restless legs   ??? Latex Rash     States she has a sensitivity to latex after being around it for days, she gets a rash: she states that when she wore her cpap machine for 14 days she developed a rash on her face other than this she has no problems stating her dentist wears latex gloves and she has no problems with latex gloves.    ??? Sulfa (Sulfonamide Antibiotics) Rash       SOCIAL HISTORY  Social History     Socioeconomic History   ??? Marital status: Widowed     Spouse name: None   ??? Number of children: None   ??? Years of education: None   ??? Highest education level: None   Occupational  History   ???  None   Tobacco Use   ??? Smoking status: Never Smoker   ??? Smokeless tobacco: Never Used   Vaping Use   ??? Vaping Use: Never used   Substance and Sexual Activity   ??? Alcohol use: Yes     Alcohol/week: 2.0 standard drinks     Types: 2 Glasses of wine per week     Comment: 2-3 wine/week   ??? Drug use: No   ??? Sexual activity: None   Other Topics Concern   ??? Caffeine Use No     Comment: 1 can coke/day   ??? 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 Exercise No   ??? Seat Belt Yes   Social History Narrative   ??? None         FAMILY HISTORY  Family History   Adopted: Yes   Problem Relation Age of Onset   ??? Diabetes Son        ROS:  CONSTITUTIONAL: No fever/chills or night sweats, no recent weight change.  No sick contacts.  HENT:  No epistaxsis, no nasal discharge, no sore throat.  CARDIAC: No chest pains or palpitations, no lower extremity edema, no orthopnea, or PND.  RESPIRATORY: No chronic cough, wheeze, or SOB  GI:  No dysphagia, no heartburn, no melena, no hematochezia.  MUSCULOSKELETAL: No joint pain.  No Raynaud's Phenomenon.  SKIN: No rashes or lesions.  NEURO: No morning headaches, syncope, or TIA symptoms.  PSYCHIATRIC: No depression or anxiety.    PHYSICAL EXAM:  VITAL SIGNS:   Vitals:    10/28/20 1536   BP: 136/76   Pulse: 77   SpO2: 98%   Weight: 194 lb (88 kg)   Height: 5' 6 (1.676 m)     Wt Readings from Last 3 Encounters:   10/28/20 194 lb (88 kg)   10/03/20 195 lb 9.6 oz (88.7 kg)   03/19/20 195 lb (88.5 kg)     GEN: No respiratory distress, alert and oriented x3  HEENT: Atraumatic, normocephalic, pupils equal reactive to light, extraocular movements intact, sclera anicteric, no maxillary or frontal sinus tenderness to palpation,  mucous membranes moist, no oral lesions. Mallampati grade 3.  NECK:  supple, no masses, no lymphadenopathy  CARDIOVASCULAR: Regular Rate and Rhythm, no murmurs gallops or rubs. Radial pulse 2+ and equal.   LUNGS: Clear to ausculation  bilaterally, no wheeze, no rhonchi, no rales, no egophony, no dullness to percussion  ABDOMEN: Soft, non-tender, nondistended, bowel sounds present  SKIN: Warm, dry  EXTREMITIES: No clubbing, no pitting edema.  NEURO: CN 2-12 grossly intact. No gross focal deficits.    PSYCH: Normal affect and mood.    Data:                IMPRESSION:   1. Hx OSA s/p UPPP  2. Daytime somnolence    PLAN:   1.  PSG

## 2020-10-31 ENCOUNTER — Inpatient Hospital Stay: Admit: 2020-10-31

## 2020-11-09 ENCOUNTER — Inpatient Hospital Stay: Admit: 2020-12-17 | Payer: MEDICARE

## 2020-11-09 DIAGNOSIS — Z4509 Encounter for adjustment and management of other cardiac device: Secondary | ICD-10-CM

## 2020-11-14 ENCOUNTER — Ambulatory Visit: Payer: MEDICARE | Attending: Pulmonary Disease

## 2020-11-29 NOTE — Unmapped (Signed)
PT called to schedule sleep study

## 2020-12-02 ENCOUNTER — Inpatient Hospital Stay: Admit: 2020-12-02

## 2020-12-02 NOTE — Unmapped (Signed)
CPAP titration ordered per Polo Riley    Patient will need diagnostic sleep study prior to titration

## 2020-12-03 NOTE — Unmapped (Signed)
Addended by: Polo Riley I on: 12/03/2020 12:13 PM     Modules accepted: Orders

## 2020-12-05 NOTE — Unmapped (Addendum)
NPSG [facility]     Sleep consult- had a sleep study and was on Cpap and then Dr. Doylene Canard did a procedure and no longer needed cpap. /Currently has lung cancer and has had a lobe removed. May have to have another surgery in the fall. /), Fatigue (Sunday she was at church and got fidgety and then realized she was extremely fatigue and she had to go home and take a nap. If she doesn't do that she has muscle issues. Hard to move legs and stay conscious.  ), and Sleep Issues (Normally about 10 hours per night. Some times can get a way with less but she will eventually have to take a nap or catch up on the missed sleep. )  .  ??  Goes to bed: 11p  How long to fall asleep:  Reads for an hour and then falls asleep  Get up during night before morning: 2-3, Nocturia:  yes  Wake up for day:  9 am  Feels refreshed:  Yes  Naps 4-5 times a week around 3pm for 2hrs  ??  Day time somnolence: Yes              Fall asleep in meetings/work:  Yes              Driving:  No              Watching TV: Yes               Problems with concentrations/Memory:  Yes   ??  Unusual sleep behaviors:   Yes              Hypnangogia or hypnopompia:   No              Cataplexy:  :   No              Sleep paralysis:   No              Leg kicking:  No              RLS symptoms:   No

## 2020-12-10 ENCOUNTER — Inpatient Hospital Stay: Admit: 2021-01-02 | Payer: MEDICARE

## 2020-12-10 DIAGNOSIS — I639 Cerebral infarction, unspecified: Secondary | ICD-10-CM

## 2020-12-25 NOTE — Unmapped (Signed)
Contacted pt re: disconnected from MDT equipment     Left voicemail and advised to unplug and reset monitor    [facility]x cardiiology number left if further assistance is needed.

## 2021-01-06 ENCOUNTER — Inpatient Hospital Stay: Admit: 2021-01-06 | Attending: Cardiovascular Disease

## 2021-01-10 ENCOUNTER — Inpatient Hospital Stay: Admit: 2021-02-17 | Discharge: 2021-02-18 | Payer: MEDICARE | Attending: Cardiovascular Disease

## 2021-01-10 DIAGNOSIS — I639 Cerebral infarction, unspecified: Secondary | ICD-10-CM

## 2021-01-16 NOTE — Unmapped (Signed)
Contacted pt re: disconnected / non-transmission from MDT equipment     Left voicemail and advised to unplug and reset monitor and to send transmission at earliest convenience.     [facility] cardiology number left if further assistance is needed.

## 2021-01-17 NOTE — Unmapped (Signed)
LM to schedule

## 2021-02-13 ENCOUNTER — Emergency Department: Admit: 2021-02-13 | Payer: MEDICARE

## 2021-02-13 ENCOUNTER — Inpatient Hospital Stay: Admit: 2021-02-13 | Discharge: 2021-02-14 | Disposition: A | Discharge status: Home / Self Care | Payer: MEDICARE

## 2021-02-13 DIAGNOSIS — M5116 Intervertebral disc disorders with radiculopathy, lumbar region: Secondary | ICD-10-CM

## 2021-02-13 MED ORDER — traMADoL (ULTRAM) 50 mg tablet
50 | ORAL_TABLET | Freq: Four times a day (QID) | ORAL | 0 refills | Status: AC | PRN
Start: 2021-02-13 — End: 2021-02-18

## 2021-02-13 MED ORDER — cyclobenzaprine (FLEXERIL) 10 MG tablet
10 | ORAL_TABLET | Freq: Three times a day (TID) | ORAL | 0 refills | Status: AC | PRN
Start: 2021-02-13 — End: ?

## 2021-02-13 MED ORDER — oxyCODONE-acetaminophen (PERCOCET) 5-325 mg per tablet 1 tablet
5-325 | Freq: Once | ORAL | Status: AC
Start: 2021-02-13 — End: 2021-02-13
  Administered 2021-02-13: 1 via ORAL

## 2021-02-13 MED ORDER — lidocaine (LIDODERM) 5 %
5 | MEDICATED_PATCH | TOPICAL | 0 refills | 30.00000 days | Status: AC
Start: 2021-02-13 — End: ?

## 2021-02-13 MED ORDER — lidocaine (LIDODERM) 5 % 1 patch
5 | Freq: Once | TOPICAL | Status: AC
Start: 2021-02-13 — End: 2021-02-14
  Administered 2021-02-13: 1 via TRANSDERMAL

## 2021-02-13 MED ORDER — ketorolac (TORADOL) injection 15 mg
15 | Freq: Once | INTRAMUSCULAR | Status: AC
Start: 2021-02-13 — End: 2021-02-13
  Administered 2021-02-13: 15 mg via INTRAMUSCULAR

## 2021-02-13 MED FILL — OXYCODONE-ACETAMINOPHEN 5 MG-325 MG TABLET: 5-325 5-325 mg | ORAL | Qty: 1

## 2021-02-13 MED FILL — LIDODERM 5 % TOPICAL PATCH: 5 5 % | TOPICAL | Qty: 1

## 2021-02-13 MED FILL — KETOROLAC 15 MG/ML INJECTION SOLUTION: 15 15 mg/mL | INTRAMUSCULAR | Qty: 1

## 2021-02-13 NOTE — Unmapped (Signed)
[place] ED Note    Date of service:  02/13/2021    Reason for Visit: Hip Pain (Excrutiating  prevents walking and moving)      Patient History     HPI Joanna Lopez is a 70 year old female who presented to the emergency department for evaluation of right lower lumbar back pain with pain shooting into her right lower extremity.  The patient denies any fevers chills.  She denies any falls or injury.  She reports no urinary or stool incontinence.  She has no difficulty with ambulation and denies motor component weakness.  She was having isolated pain discomfort primarily of the right lower lumbar area with pain radiating to her right posterior buttock and hamstring.    Past Medical History:   Diagnosis Date   ??? Anxiety    ??? Basal cell cancer    ??? Hyperlipidemia    ??? Hypertension    ??? Lung nodule    ??? OSA (obstructive sleep apnea)    ??? RLS (restless legs syndrome)        Past Surgical History:   Procedure Laterality Date   ??? APPENDECTOMY     ??? COLONOSCOPY N/A 01/23/2013    Procedure: COLONOSCOPY WITH MAC;  Surgeon: Lurene Shadow, MD;  Location: [facility] ENDOSCOPY;  Service: Gastroenterology;  Laterality: N/A;   ??? FLEXIBLE BRONCHOSCOPY W/ UPPER ENDOSCOPY N/A 09/17/2016    Procedure: BRONCHOSCOPY SUPER D WITH FLUORO AND BIOPSY-EBUS/NAVI;  Surgeon: Johnny Bridge, MD;  Location: [facility] ENDOSCOPY;  Service: Pulmonary;  Laterality: N/A;   ??? LOOP IMPLANT IMPLANTATION N/A 04/07/2017    Procedure: Loop  Implantation;  Surgeon: Tennis Must, MD;  Location: [facility] ELECTROPHYSIOLOGY LAB;  Service: Electrophysiology;  Laterality: N/A;   ??? submastoid gland removal     ??? THORACOSCOPY Right 11/23/2016    Procedure: RIGHT SIDED VATS MIDDLE LOBE LOBECTOMY,HILAR MEDIASTINAL LYMPH NODE DISSECTION;  Surgeon: Janett Billow, MD;  Location: [facility] OR;  Service: Thoracic;  Laterality: Right;   ??? TONSILLECTOMY     ??? UVULOPALATOPHARYNGOPLASTY         Social History     Tobacco Use   Smoking Status  Never Smoker   Smokeless Tobacco Never Used       Social History     Substance and Sexual Activity   Alcohol Use Yes   ??? Alcohol/week: 2.0 standard drinks   ??? Types: 2 Glasses of wine per week    Comment: 2-3 wine/week       Social History     Substance and Sexual Activity   Drug Use No       Previous Medications    ACETAMINOPHEN (TYLENOL) 325 MG TABLET    Take 3 tablets (975 mg total) by mouth every 6 hours as needed.    ASPIRIN 325 MG TABLET    Take 1 tablet (325 mg total) by mouth daily.    ASPIRIN-ACETAMINOPHEN-CAFFEINE (EXCEDRIN) 250-250-65 MG PER TABLET    Take 2 tablets by mouth every 6 hours as needed for Pain.    ATORVASTATIN (LIPITOR) 80 MG TABLET    Take 1 tablet (80 mg total) by mouth at bedtime.    CALCIUM CARBONATE (CALCIUM 300 ORAL)    Take by mouth.    DICYCLOMINE (BENTYL) 20 MG TABLET    Take 20 mg by mouth 4 times a day with meals and at bedtime.    FLUTICASONE (FLONASE) 50 MCG/ACTUATION NASAL SPRAY    Use 2 sprays into each nostril if needed.    HYDROCHLOROTHIAZIDE (HYDRODIURIL)  25 MG TABLET    Take 1 tablet by mouth daily.    LIPASE-PROTEASE-AMYLASE (CREON) 24,000-76,000 -120,000 UNIT CPDR    Take 2 capsules po with each meal.    LISINOPRIL (PRINIVIL,ZESTRIL) 20 MG TABLET    Take 10 mg by mouth daily. 10 mg (half tablet) daily    MELATONIN 3 MG TAB    Take 3 mg by mouth at bedtime. PRN for sleep    MULTIVITAMIN (THERAGRAN) TABLET    Take 1 tablet by mouth daily.    PAROXETINE (PAXIL) 20 MG TABLET    Take 1 tablet by mouth daily.    ROPINIROLE (REQUIP) 0.25 MG TABLET    Take 1 tablet (0.25 mg total) by mouth daily with dinner. Take one tablet (0.25 mg) 1-2 hours before bedtime, for three days, then take 2 tablets (0.5 mg) 1-2 hours before bedtime for four days, then take 4 tablets (1 mg) 1-2 hours before bedtime thereafter       Allergies:   Allergies as of 02/13/2021 - Verified 02/13/2021   Allergen Reaction Noted   ??? Penicillins Anaphylaxis 07/21/2012   ??? Formaldehyde analogues  07/21/2012   ???  Promethazine hcl  04/10/2017   ??? Latex Rash 12/03/2011   ??? Sulfa (sulfonamide antibiotics) Rash 07/21/2012     Family history has been reviewed electronic medical record  Review of Systems     Review of Systems  Constitutional:  Denies fever or chills   Eyes:  Denies change in visual acuity   HENT:  Denies nasal congestion or sore throat   Respiratory:  Denies cough or shortness of breath   Cardiovascular:  Denies chest pain or edema   GI:  Denies abdominal pain, nausea, vomiting, bloody stools or diarrhea   GU:  Denies dysuria   Musculoskeletal: See HPI  Integument:  Denies rash   Neurologic:  Denies headache, focal weakness or sensory changes   Endocrine:  Denies polyuria or polydipsia   Lymphatic:  Denies swollen glands   Psychiatric:  Denies depression or anxiety      Physical Exam     ED Triage Vitals [02/13/21 1650]   Vital Signs Group      Temp 97.1 ??F (36.2 ??C)      Temp Source Tympanic      Heart Rate 86      Heart Rate Source Monitor      Resp 16      SpO2 94 %      BP 118/86      MAP (mmHg)       BP Location Left arm      BP Method Automatic      Patient Position Sitting   SpO2 94 %   O2 Device None (Room air)       Physical Exam  Constitutional:  Well developed, well nourished, no acute distress, non-toxic appearance   Eyes:  PERRL, conjunctiva normal sclera are white and extraocular movements are intact  HENT:  Atraumatic, external ears normal, TMs are clear bilateral nose normal, oropharynx moist, no pharyngeal exudates. Neck- normal range of motion, no tenderness, supple   Respiratory:  No respiratory distress, normal breath sounds, no rales, no wheezing   Cardiovascular:  Normal rate, normal rhythm, no murmurs, no gallops, no rubs   GI:  Soft, nondistended, normal bowel sounds, nontender, no organomegaly, no mass, no rebound, no guarding   GU:  No costovertebral angle tenderness   Musculoskeletal:  No edema, no tenderness, no deformities. Back- no  tenderness of midline C-spine, T-spine or L-spine  midline tenderness the patient has paraspinous tenderness on the right at the L5  Integument:  Well hydrated, no rash   Neurologic:  Alert & oriented x 3, CN 2-12 normal, normal motor function, normal sensory function, no focal deficits noted   Psychiatric:  Speech and behavior appropriate    Diagnostic Studies     Labs:    Please see electronic medical record for any tests performed in the ED    Radiology:    Please see electronic medical record for any tests performed in the ED    EKG:    No EKG Performed    Emergency Department Procedures     Procedures none    ED Course and MDM     Joanna Lopez is a 70 y.o. female who presented to the emergency department with Hip Pain (Excrutiating  prevents walking and moving)  The patient presented to the emergency department for evaluation of sciatica and pain radiating to the right leg.  The patient denies any bowel or bladder habit changes and presents for evaluation of right lower extremity pain.  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  The patient had pain improvement after treatment in the emergency department and appears clinic well for outpatient follow-up.  She will be discharged on Flexeril, Ultram and Lidoderm for pain.           Critical Care Time (Attendings)   None  Final impression is sciatica  Disposition is discharge to home       Cherly Anderson, MD  02/13/21 2026

## 2021-02-13 NOTE — Unmapped (Signed)
Contacted pt re: disconnected / non-transmission from MDT equipment     Left voicemail and advised to unplug and reset monitor and to send transmission at earliest convenience.     [facility] cardiology number left if further assistance is needed.

## 2021-02-13 NOTE — Unmapped (Signed)
Patient a&ox4 and agreeable to discharge. Patient education provided and follow up encouraged. Three rx provided. Skin pwd, rr even and unlabored, no s/sx of distress prior to discharge. Patient able to ambulated however provided wheelchair to exit for comfort.

## 2021-02-18 NOTE — Unmapped (Signed)
LM

## 2021-02-21 ENCOUNTER — Inpatient Hospital Stay: Admit: 2021-03-24 | Discharge: 2021-03-25 | Payer: MEDICARE

## 2021-02-21 DIAGNOSIS — Z4509 Encounter for adjustment and management of other cardiac device: Secondary | ICD-10-CM

## 2021-02-24 ENCOUNTER — Inpatient Hospital Stay: Admit: 2021-02-24

## 2021-02-27 NOTE — Unmapped (Signed)
LVM for pt to return call regarding Loop reaching EOS. Per Dr. Arvella Nigh, ok to just  leave in or remove. Whichever pt prefers.

## 2021-03-04 NOTE — Unmapped (Signed)
LVM for patient to return call to confirm date/time of procedure and go over pre procedure instructions.     Procedure    Loop removal  Date: 04/09/2021 at [facility]    Check in at the first floor main lobby, information desk at 12:30pm    1. Nothing to eat or drink 4 hours prior to procedure    2. You may drive to/from procedure    You may take your medications    A nurse visit wound check appointment has been scheduled for 04/16/2021 at 2:45pm

## 2021-03-05 NOTE — Unmapped (Signed)
Pt returned a call to [facility] regarding this encounter    Advised pt of message above, also relayed visit follow up address and suite number to the pt. Pt verbalized understanding and has no further questions at this time

## 2021-03-14 ENCOUNTER — Inpatient Hospital Stay: Admit: 2021-03-14 | Discharge: 2021-03-18 | Payer: MEDICARE | Attending: Pulmonary Disease

## 2021-03-14 DIAGNOSIS — J984 Other disorders of lung: Secondary | ICD-10-CM

## 2021-03-25 ENCOUNTER — Ambulatory Visit: Admit: 2021-03-25 | Discharge: 2021-04-01 | Payer: MEDICARE | Attending: Pulmonary Disease

## 2021-03-25 DIAGNOSIS — R911 Solitary pulmonary nodule: Secondary | ICD-10-CM

## 2021-03-25 NOTE — Unmapped (Addendum)
History of Present Illness:      Joanna Lopez is a 70 y.o. female.    HPI    I saw Mrs Joanna Lopez last time in October 2015  She had another CT chest done - was seen by her PCP who called me with results  She feels the same  No major changes since last time I saw he  Patient denies hemoptysis, increased shortness of breath, chest pains, headaches, nausea, vomiting, diarrhea  No fevers or chills. 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 The patient provided verbal consent for an over the phone visit.    I spent 15 to 19 minutes  on this call conducting an interview, performing a limited exam by phone, and educating the patient on my assessment and plan.  Doing well  No issues since last I saw her  No arhythmias on the loop recorder    03/19/2020  Doing well  Happy  Had ct chest today    10/03/2020  Doing well she will go to [place] and [place] on a cruise in July  Enjoys life  Patient denies hemoptysis, increased shortness of breath, chest pains, headaches, nausea, vomiting, diarrhea  No fevers or chills. No new skin rashes    03/25/21  Doing well  Had a wonderful trip to [place] and [place]  Had ct chest    Review of Systems   Constitutional: Positive for fatigue.   HENT: Positive for postnasal drip.    Respiratory: Positive for cough, shortness of breath and wheezing.    Gastrointestinal: Positive for bloating and diarrhea.   Musculoskeletal: Positive for arthralgias.   Neurological:  Positive for tremors.   Hematological: Bruises/bleeds easily.   Psychiatric/Behavioral: The patient is nervous/anxious.    All other systems reviewed and are negative.      Objective:   Blood pressure 118/80, pulse 71, height 5' 6 (1.676 m), weight 191 lb 8 oz (86.9 kg), SpO2 96 %.    Physical Exam    Nursing note and vitals reviewed.   Constitutional: Oriented to person, place, and time. Appears well-developed and well-nourished. No distress.   HENT: pupils equal, no scleral icterus, on conjunctivitis, no enlarged thyroid  Head: Normocephalic.    Ears: External ear normal.   Mouth/Throat: Oropharynx is clear and moist. No oropharyngeal exudate.   Eyes: Conjunctivae are normal. Pupils are equal, round, and reactive to light. Right eye exhibits no discharge. Left eye exhibits no discharge.   Neck: Normal range of motion. Neck supple. No JVD present. No tracheal deviation present. No thyromegaly present.   Cardiovascular: Normal rate, regular rhythm, normal heart sounds and intact distal pulses. Exam reveals no gallop and no friction rub.   No murmur heard.   Pulmonary/Chest: Effort normal and breath sounds normal. No stridor. No respiratory  distress. No wheezes. No rales. Exhibits no tenderness.   Abdominal:Exhibits no distension and no mass. There is no tenderness. There is no rebound and no guarding.   Musculoskeletal: exhibits no edema and no tenderness.   Neurological: alert and oriented to person, place, and time. normal reflexes. No cranial nerve deficit. Coordination normal.   Skin: Warm and dry. Not diaphoretic. No erythema.   Psychiatric: Normal mood and affect. Behavior is normal. Judgment and thought content normal.       Exam: CT CHEST WO CONTRAST dated 08/14/2016 10:17 AM EDT  ??  CLINICAL HISTORY: lung nodules;   ??  COMPARISON: February 07, 2014  ??  TECHNIQUE: Multidetector  CT imaging was obtained through the chest in the supine position without intravenous contrast. The images were reconstructed with 5  and 1 mm collimation using a 38 cm field of view.  ??  FINDINGS: The lung windows show that the central airways are patent. Small calcified nodules reflect healed granulomatous infection.  ??  The well-defined noncalcified nodule in the medial segment right middle lobe measures approximately 2.1 x 1.8 cm on series 5 image 214, previously 1.7 x 1.5 cm on February 07, 2014. The central portion of this nodule is fluid attenuation.  ??  The irregular ill-defined subsolid nodule in the superior lingular subsegment of the left upper lobe on series 5 image 193 measures approximately 0.9 x 0.4 cm.  ??  A tiny subpleural nodule at the left apex laterally on series 5 image 73 is unchanged. There are no new lesions.  ??  The mediastinal windows show granulomatous calcification but otherwise no lymphadenopathy.  ??  No coronary artery calcifications are visible. The caliber of the great vessels is normal.  ??  Limited noncontrast imaging through the upper abdomen shows multiple low-attenuation liver lesions, similar to prior studies, likely cysts. A fluid attenuation lesion centrally in the left kidney is larger compared to 2014 but likely a peripelvic cyst.  ??  The bone windows show no suspicious lesions.  ??  ??  IMPRESSION:  ??  While predominantly fluid attenuation, the right middle lobe nodule is mildly increased in size compared to 3 years previously. This is most likely a benign lesion such as granuloma or hamartoma. The fluid attenuation makes carcinoid tumor less likely. Nevertheless, due to the interval growth a follow-up CT in 12 months should be considered.  ??  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 This can be followed at the same time.  ??  Report Verified by: Rick Duff, M.D. at 08/14/2016 12:53 PM EDT      EXAM: CT CHEST WO CONTRAST   ??  INDICATION: Abnormal xray - lung nodule, < 1 cm, mod-high risk,    ??  COMPARISON: CT of the chest from 10/24/2018.  ??  TECHNIQUE: Multidetector CT imaging was obtained through the chest in the supine position without intravenous contrast. Additional axial MIP images were reconstructed.    ??  FINDINGS:   Medical devices: Loop recorder in the anterior left chest wall, unchanged.  ??  Airways & lungs: The central airways are patent. Stable postsurgical changes related to right middle lobectomy. Previously demonstrated pulmonary nodules are stable including the subsolid nodule in the lateral left upper lobe. No new or enlarging pulmonary nodules. Upper lobe predominant emphysema, unchanged.  ??  Pleura: No pleural effusions or pneumothorax.  ??  Lower Neck: Unremarkable.  ??  Heart: The heart is normal in size.  ??  Vascular structures: Aorta is normal in caliber. Main pulmonary artery is normal in caliber.  ??  Mediastinum and hila: No pathologically enlarged lymph nodes.  ??  Chest wall and axilla: Unremarkable.  ??  Upper abdomen: Low-attenuation lesions in the liver, unchanged and most likely cysts.  ??  Musculoskeletal: No suspicious osteolytic or osteoblastic lesions.  ??  ??  IMPRESSION:  ??  1.  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 Recommend follow-up low-dose chest CT in 12 months.  ??  Report Verified by: Essie Hart, MD at 04/13/2019 3:03 PM EST    EXAM: CT CHEST WO CONTRAST     INDICATION: Lung nodule, > 8mm     COMPARISON: CT chest dated April 13, 2019, October 24, 2018, May 31, 2018, CT chest dated February 07, 2014.     TECHNIQUE: Multidetector CT imaging was obtained through the chest in the supine position without intravenous contrast. Additional axial MIP images were reconstructed. ??     FINDINGS:   Medical devices: Left apical there is seen in the subcutaneous tissue of the left anterior chest wall.     Airways & lungs: Stable postsurgical changes of right middle lobectomy. Central  airways are patent. Mild apical predominant emphysema. Few scattered calcified granulomas. Seen is spiculated nodule in the left upper lobe with tiny central area of lucency measuring approximately 9 x 6 mm (image #184, series 4), equivocally increase in size compared to study dated May 10, 2019 and definitely increased in size compared to study dated February 07, 2014. Additional subsolid nodule appears unchanged. No new pulmonary nodule. Bibasilar atelectasis. Lungs are otherwise clear.     Pleura: No pleural effusions or pneumothorax.     Lower Neck: Unremarkable.     Heart: The heart is normal in size.     Vascular structures: Mild calcification of aorta and branch vessels. The vascular structures otherwise unremarkable. Main pulmonary artery is normal in caliber.     Mediastinum and hila: No pathologically enlarged lymph nodes. Calcified right hilar and mediastinal lymph nodes.     Chest wall and axilla: Unremarkable.     Upper abdomen: Again seen are multiple hepatic cyst.     Musculoskeletal: No suspicious osteolytic or osteoblastic lesions.     Impression   IMPRESSION:     1. ??Subtle increase in size of subsolid nodule in the left upper lobe measuring 9 x 6 mm. Follow-up is suggested in 6 months to ensure stability.   2. ??Mild emphysema.       Report Verified by: Lana Fish, MD at 03/19/2020 2:15 PM EST         Assessment:     Lung nodules, carcinoid, s/p resection VATS RML  Lung nodule LUL  ??  Plan:   ??  Patient with a large nodule RML that on path was typical carcinoid  The LUL nodule is maybe slightly denser, images reviewed, discussed with patient, still question of adenocarcinoma  Will repeat ct chest in 6 months - if larger, she will need to see Dr Harriette Bouillon again  PFTs reviewed  ??  Overall clinically she looks very stable  ??

## 2021-04-07 NOTE — Unmapped (Signed)
[facility]    We're pleased that you have chosen [facility] for your upcoming procedure.  The staff serving you is professionally trained to provide the highest quality care.  We encourage you to ask questions and to let the staff know your special needs.  We want your visit to be as comfortable as possible. Below you will find instructions for your upcoming procedure. Please read the instructions and follow closely, failure to follow instructions may lead to delay or cancellation of your surgery.     Your procedure is scheduled on 11/30.    Please arrive at 12:30 PM.    Location: Come in the main entrance of the hospital and the registration desk will be on your right as soon as you enter the lobby.     [facility]        [address]  [place], [place] [address]    Surgery area phone number:   [phone]       OTHER USEFUL INFORMATION [facility])     If you have any questions about these instructions please call us at:    [facility]  Monday - Friday 8:00 am - 4:30 pm  [phone]    For questions regarding surgery, please call your surgeon.       GENERAL INFORMATION BEFORE SURGERY     ?STOP shaving in the area of the surgery for the 3 days prior.   If needed, a trained staff member will clip the area immediately before your surgery.    Contact your surgeon if you develop any of the following:  A fever or symptoms of an Upper Respiratory Infection prior to your procedure.  A skin rash/wound/lesion prior to your procedure.  A cold or are sick ANYTIME prior to procedure.    If you smoke, quit smoking as far in advance of surgery as possible.   Patients who quit at least 30 days before surgery may have better outcomes.            MEDICATIONS     ? ONE WEEK PRIOR TO YOUR PROCEDURE STOP/ DO NOT TAKE          (the following are held due to increased risk of bleeding).    SUPPLEMENTS (including COQ 10, GLUCOSAMINE  and CHONDROITIN)  FISH OIL  MULTIVITAMINS  HERBAL SUPPLEMENTS: Coenzyme Q-10, Garlic, Ginseng, Ginko Biloba, Vitamin E, Echinacea, Turmeric, Saw Palmetto, St. John's Wort, Herbal tea  CANNABIS PRODUCTS: ex. CBD oil, Gummies or edibles, THC     ASPIRIN  NSAIDS (non-steroidal anti-inflammatories such as Ibuprofen, Advil, Aleve, Naproxen,                       Excedrin, Meloxicam, Mobic, Celebrex, oral Diclofenac, and oral Voltaren)    ? OK TO TAKE     ACETAMINOPHEN (TYLENOL): This is the only over the counter pain medication that is ok to take before surgery.   Vitamins to treat a specific deficiency: ex. Vitamins B, C and D, Calcium, Folate, Iron, Magnesium and Potassium.    CONTINUE ALL OTHER PRESCRIBED HOME MEDICATIONS   UNLESS OTHERWISE INSTRUCTED.    BLOOD THINNERS     Aspirin: Continue to take if you have history of stroke, cardiac stents, peripheral vascular disease unless specifically directed by your provider otherwise.       EATING and DRINKING INSTRUCTIONS THE MORNING OF YOUR PROCEDURE  IF your physician has prescribed a special diet or bowel prep prior to your surgery or procedure, please follow the instructions from your physician's office.    IF your arrival time changes, the times below will need to be adjusted accordingly.     Nothing to eat or drink after midnight except:                    Complete your last full meal and any fluids that are NOT clear by 8:30AM            MORNING OF SURGERY    See EATING and DRINKING INSTRUCTIONS ABOVE.    ?TAKE ONLY these medications the morning of surgery with a small sip of water     Take your medications like your normally as instructed by cardiology          Day of surgery     PLEASE BRUSH YOUR TEETH    SHOWER:   Shower BOTH the night before and the morning of surgery with an antibacterial soap, such as Dial.       ?Remove all makeup, nail polish, jewelry, wristwatch, body piercings.  ?Do not use powder, lotions, deodorant, and perfume/cologne.     WHAT TO  WEAR: Wear casual, loose fitting, and comfortable clothing.    Bring with you:  ? List of your medications, dose and how often you take the medication.  ? List of over-the-counter medications and supplements, dose and how often you take the       medication.    ? Photo ID  ? Insurance card     ? Glasses and case  ? Dentures and case   ? Hearing aids and case  ? Crutches, walker, cane (if you have the items and will need them after surgery)        Leave at home:  We recommend that you leave valuables (ex. money, jewelry, credit cards) at home or with your family.   ? MEDICATIONS: Do not bring any medications to the hospital. (Exception: transplant patients)  ? CONTACT LENSES- leave at home or bring a case for safe keeping.      TRANSPORTATION and VISITORS     Please make transportation arrangements and bring ONE responsible adult to accompany you home and remain with you for 24 hours after having received anesthesia.              You will NOT be permitted to drive.  You may take a taxi, uber, etc., only if accompanied by a familiar adult.    VISITING:  All visitors must wear a mask and will be screened at check in.  Visitors will not be permitted to spend the night.     VISITING HOURS:   Same Day Surgery/Pre-Anesthesia: Open to two visitors per patient, from 5:30 a.m. to 7 p.m.  Visitors must be 64 years of age or older.         WHAT TO EXPECT     Make sure all of your health care givers are checking your ID bracelet and verifying your name and date of birth.  You will actively be involved in verifying the type of surgery you are having and the correct site.      Your health care givers should be cleaning their hands with soap and water or antibacterial foam before taking care of you and if they do not, it is ok to remind them to do so.  Antibacterial showering and good  hand hygiene are essential to prevent surgical site infections and reduce the spread of MRSA.     In an effort to reduce the risks of blood clots  after your surgery you may have compression sleeves on your lower legs.  These sleeves help facilitate circulation and decrease the chances of developing any blood clots.     You may be given an incentive spirometer after surgery to use every hour to help prevent pneumonia by having you take deep breaths in and out.  You will be given instructions about proper use after surgery.

## 2021-04-09 MED ORDER — lidocaine (XYLOCAINE) 20 mg/mL (2 %) injection
20 | INTRAMUSCULAR | Status: AC
Start: 2021-04-09 — End: ?

## 2021-04-09 MED FILL — LIDOCAINE HCL 20 MG/ML (2 %) INJECTION SOLUTION: 20 20 mg/mL (2 %) | INTRAMUSCULAR | Qty: 40

## 2021-04-09 NOTE — Unmapped (Signed)
PROCEDURE PERFORMED: Removal of a loop recorder (ILR)    ATTENDING: Lawerance Cruel, M.D.     COMPLICATIONS: None.     ESTIMATED BLOOD LOSS: <5 cc    ANESTHESIA: lidocaine (local)    OTHER MEDICATIONS: None    HISTORY: This is a 70 year old female with history of a loop recorder implant for cryptogenic stroke here for removal of the loop recorder due to device EOS.    DETAILS OF PROCEDURE:     The patient was brought to the electrophysiologic laboratory in stable condition.  The patient was in a fasting, non-sedated state. The risks, benefits and alternatives of the procedure were discussed with the patient. The risks including, but not limited to, the risks of infection and bleeding were discussed with the patient. After considering the risks and alternatives of the procedure, the patient elected to proceed with the procedure. Written informed consent was obtained and placed in the chart.    The patient was prepped and draped in a sterile fashion.  Using the blade a 1 cm incision was made in the left 4th intercostal space just lateral to the sternum.The device was removed from the pocket. The pocket was closed with 3-0 vicryl and steristrips.  A dressing was applied.  The patient was transported to the holding area in stable condition.    Removed Materials:  Implant: REVEAL LINQ                Manufacturer: [employer]                    Model number: [id number]                                                                             Summary: Successful removal of a loop recorder.    Recommendations:  1. Bedrest x 30 minutes  2. Vital signs per protocol

## 2021-04-09 NOTE — Unmapped (Addendum)
LOOP RECORDER Removal       Wound care:     Keep dressing clean and dry until seen in office in 1 week or as instructed by       physician.     No showers for 1 week.     Watch for redness, swelling, foul smelling drainage, fever greater than 100.5     May take Tylenol for discomfort.    Call office for dizziness, feeling light headed.

## 2021-04-09 NOTE — Unmapped (Signed)
Reviewed discharge instructions with pt, verbalized understanding.

## 2021-04-16 ENCOUNTER — Ambulatory Visit: Payer: MEDICARE

## 2021-04-16 NOTE — Unmapped (Signed)
Patient ambulated into clinic for wound check under own power and in no distress.   Incision is well-approximated. Incision has no redness, no warmth, no drainage, and no excessive tenderness. There is not bruising present at incision.   Restrictions reviewed. Questions answered.   Patient ambulated out of clinic without difficultly.

## 2021-04-29 ENCOUNTER — Ambulatory Visit: Admit: 2021-04-29 | Payer: MEDICARE

## 2021-04-29 DIAGNOSIS — R002 Palpitations: Secondary | ICD-10-CM

## 2021-04-29 NOTE — Unmapped (Signed)
Subjective:       Patient ID: Joanna Lopez is a 70 y.o. female.    Chief Complaint: follow up post ILR implant.    HPI   Office visit 03/22/17:  This is a 69 y.o. female??with HTN, OSA, lung Ca s/p R middle lobectomy, recent hospital admission 10/12 - 02/24/17 for diplopia and noted to have ataxia while in the ER which resolved. She had extensive work up done including MRI brain, lumbar puncture, TTE. There was concern for small apical LV thrombus on initial echocardiogram, however, on repeat echo with contrast there was no evidence of LV thrombus. She is referred to me to discuss long term cardiac rhythm monitoring to assess for AF as potential cause for TIA. She has no prior h/o AF. She had 2 episodes of palpitations lasting few min over the last month. These happened during night and resolved quickly. No dizziness or syncope.    Office visit 07/06/17:  Underwent ILR implant on 04/07/17. No issues at implant site. Denies palpitations, dizziness, syncope.    Today's office visit 04/29/21- Previous pt of Dr. Elvera Maria.  Had ILR placed for TIA.  No AF and ILR removed.  Pt feels good. Has rare palps.      Histories:   She has a past medical history of Anxiety, Basal cell cancer, Hyperlipidemia, Hypertension, Lung nodule, OSA (obstructive sleep apnea), and RLS (restless legs syndrome).    She has a past surgical history that includes Appendectomy; submastoid gland removal; Uvulopalatopharyngoplasty; Tonsillectomy; Colonoscopy (N/A, 01/23/2013); Flexible bronchoscopy w/ upper endoscopy (N/A, 09/17/2016); Thoracoscopy (Right, 11/23/2016); Loop Implant  Implantation (N/A, 04/07/2017); and Loop Removal (Left, 04/09/2021).    Her family history includes Diabetes in her son. She was adopted.    She reports that she has never smoked. She has never used smokeless tobacco. She reports current alcohol use of about 2.0 standard drinks per week. She reports that she does not use drugs.    ROS:   ROS  Denies  cp/pressure/dyspnea/syncope/presncope + palps    Allergies:   Penicillins, Formaldehyde analogues, Promethazine hcl, Latex, and Sulfa (sulfonamide antibiotics)    Medications:     Outpatient Encounter Medications as of 04/29/2021   Medication Sig Dispense Refill   ??? acetaminophen (TYLENOL) 325 MG tablet Take 3 tablets (975 mg total) by mouth every 6 hours as needed. 60 tablet 0   ??? aspirin 325 MG tablet Take 1 tablet (325 mg total) by mouth daily. 30 tablet 0   ??? aspirin-acetaminophen-caffeine (EXCEDRIN) 250-250-65 mg per tablet Take 2 tablets by mouth every 6 hours as needed for Pain.     ??? atorvastatin (LIPITOR) 10 MG tablet Take 1 tablet (10 mg total) by mouth daily.     ??? atorvastatin (LIPITOR) 80 MG tablet Take 1 tablet (80 mg total) by mouth at bedtime. 30 tablet 0   ??? cyclobenzaprine (FLEXERIL) 10 MG tablet Take 1 tablet (10 mg total) by mouth 3 times a day as needed. 20 tablet 0   ??? dicyclomine (BENTYL) 20 mg tablet Take 20 mg by mouth 4 times a day with meals and at bedtime.     ??? lidocaine (LIDODERM) 5 % Place 1 patch onto the skin daily. Apply patch for 12 hours and then remove patch and leave off for 12 hours. 30 patch 0   ??? lipase-protease-amylase (CREON) 24,000-76,000 -120,000 unit CpDR Take 2 capsules po with each meal. (Patient taking differently: Take 2 capsules po with each meal.  Taking PRN Indications: exocrine pancreatic  insufficiency) 180 capsule 6   ??? lisinopril (PRINIVIL,ZESTRIL) 20 MG tablet Take 10 mg by mouth daily. 10 mg (half tablet) daily     ??? melatonin 3 mg Tab Take 3 mg by mouth at bedtime. PRN for sleep     ??? multivitamin (THERAGRAN) tablet Take 1 tablet by mouth daily.     ??? PARoxetine (PAXIL) 20 MG tablet Take 1 tablet by mouth daily.     ??? roPINIRole (REQUIP) 0.25 MG tablet Take 1 tablet (0.25 mg total) by mouth daily with dinner. Take one tablet (0.25 mg) 1-2 hours before bedtime, for three days, then take 2 tablets (0.5 mg) 1-2 hours before bedtime for four days, then take 4  tablets (1 mg) 1-2 hours before bedtime thereafter 95 tablet 5     No facility-administered encounter medications on file as of 04/29/2021.        Objective:       Blood pressure 110/78, pulse 72, weight 194 lb (88 kg), SpO2 96 %.    Physical Exam  General: Well-appearing, in no acute distress.  HEENT: Normocephalic, atraumatic.   Neck: Supple. No jugular venous distention.  Respiratory: Clear to auscultation.   Cardiovascular: Normal S1 and S2. Regular.   Gastrointestinal: Soft, nondistended.   Extremities: Without clubbing, cyanosis, or edema.   Neurologic: Alert and oriented x3. Speech is clear and fluent.   Skin:  Warm to touch, no rashes, lumps, or lesions. ILR removal site-healing well  MS: No joint swelling in all extremities.    Lab Review:     CBC:    Lab Results   Component Value Date    HGB 13.2 03/30/2017    HGB 13.2 11/23/2016    HCT 39.5 03/30/2017    HCT 40.5 11/23/2016    PLT 183 03/30/2017    WBC 9.9 03/30/2017    MCV 91.4 03/30/2017    MCH 30.7 03/30/2017       RENAL:    Lab Results   Component Value Date    NA 139 03/30/2017    K 4.1 03/30/2017    CL 106 03/30/2017    CO2 25 03/30/2017    BUN 38 (H) 03/30/2017    CREATININE 1.50 (H) 03/30/2017    GLUCOSE 95 03/30/2017    PHOS 3.5 11/26/2016    ALBUMIN 3.4 (L) 02/22/2017    CALCIUM 9.2 03/30/2017       LIVER:    Lab Results   Component Value Date    AST 11 (L) 12/29/2016    ALT 14 12/29/2016    BILITOT 0.8 12/29/2016    ALBUMIN 3.4 (L) 02/22/2017    ALKPHOS 69 12/29/2016       LIPIDS:    Lab Results   Component Value Date    CHOLTOT 174 02/22/2017    LDL 83 02/22/2017    HDL 41 (L) 02/22/2017    TRIG 249 (H) 02/22/2017         Review of Test Results:     Echocardiogram    Recent Results (from the past 8760 hours)   ECHOCARDIOGRAM COMPLETE WITH BUBBLE STUDY    Narrative                            * Deborah Chalk                       Department of Cardiology*  [address]                         [place], [place]  [address]                              [phone]    Transthoracic Echocardiography    (Report amended )    Patient:    Joanna Lopez, Joanna Lopez  MR #:       [id number]  Account:  Study Date: 02/22/2017  Gender:     F  Age:        9  DOB:        20-Feb-1951  Room:       [facility]     CONSULTING  Kleindorfer, Dawn   ADMITTING   Katneni, Ranjit   PERFORMING  [facility], Mca   ATTENDING   Kelso, Hema Kumari   ORDERING    Farson, Emily Rachael   REFERRING   Excello, Emily Rachael   REFERRING   [place]   OPERATOR    Mequon, Melissa    --------------------------------------------------------------------    Procedure:ECHO 2D         Order:ECHO 2D         Accession  COMPLETE W/BUBBLE STUDY   COMPLETE W/BUBBLE     Number:[id number]                            STUDY    --------------------------------------------------------------------  Indications:      G45.9 TIA.    --------------------------------------------------------------------  PMH:  Hyperlipidemia, GERD, OSA.  Risk factors:  Hypertension.     --------------------------------------------------------------------  Study data:   Study status:  Routine.  Procedure:  Transthoracic  echocardiography. Image quality was good. Scanning was performed  from the parasternal, apical, and subcostal acoustic windows.  Intravenous contrast (agitated saline) was administered.  Transthoracic echocardiography.  M-mode, complete 2D, complete  spectral Doppler, and color Doppler.  Birthdate:  Patient birthdate:  18-Mar-1951.  Age:  Patient is 70yr old.  Sex:  Gender: female.  Height:  Height: 67in. Height: 67in.  Weight:  Weight: 224.5lb.  Weight: 224.6lb.  Body mass index:  BMI: 35.2kg/m^2.  Body surface  area:    BSA: 2.38m^2.  Blood pressure:     150/88  Patient status:  Inpatient.  Study date:  Study date: 02/22/2017. Study time: 10:30  AM.  Location:  Echo laboratory.    --------------------------------------------------------------------  Study Conclusions    - Left ventricle:  The cavity size was normal. Wall thickness was    normal. Systolic function was normal. The estimated ejection    fraction was in the range of 60% to 65%. Wall motion was normal;    there were no regional wall motion abnormalities.There was a    small, well circumscribed echodensity at the left ventricular    apex, of uncertain etiology. There are no accompanying wall motion    abnormalities to suggest an apical thrombus. Doppler parameters    are consistent with abnormal left ventricular relaxation (grade 1    diastolic dysfunction).  - Mitral valve: Mild regurgitation.  - Right ventricle: Systolic function was normal.  - Atrial septum: No defect or patent foramen ovale was identified.  - Pulmonic valve: Peak gradient (S): 4mm Hg.    Recommendations:  This procedure has been discussed with the  referring physician, Dr. Bufford Spikes.    --------------------------------------------------------------------  Cardiac Anatomy    Left ventricle:    - The cavity size was normal. Wall thickness was normal. Systolic    function was normal. The estimated ejection fraction was in the    range of 60% to 65%. Wall motion was normal; there were no    regional wall motion abnormalities.There was a small, well    circumscribed echodensity at the left ventricular apex, of    uncertain etiology. There are no accompanying wall motion    abnormalities to suggest an apical thrombus.    - Doppler parameters are consistent with abnormal left ventricular    relaxation (grade 1 diastolic dysfunction).    Aortic valve:    - Trileaflet; normal thickness leaflets. Mobility was not    restricted.    Doppler:    - Transvalvular velocity was within the normal range. There was no    stenosis. No regurgitation.    - Peak velocity ratio of LVOT to aortic valve: 0.73.    - Mean gradient (S): 4mm Hg. Peak gradient (S): 7mm Hg.    Aorta:   Aortic root:    - The aortic root was normal in size.    Mitral valve:    - Structurally normal valve. Mobility was not  restricted.    Doppler:    - Transvalvular velocity was within the normal range. There was no    evidence for stenosis. Mild regurgitation.    - Mean gradient (D): 1mm Hg. Peak gradient (D): 4mm Hg.    Left atrium:    - The atrium was normal in size.    Atrial septum:    - No defect or patent foramen ovale was identified.    Right ventricle:    - The cavity size was normal. Wall thickness was normal. Systolic    function was normal. Systolic pressure was within the normal    range.    Pulmonic valve:    - Poorly visualized.    Doppler:    - Transvalvular velocity was within the normal range. There was no    evidence for stenosis. No regurgitation.    - Mean gradient (S): 2mm Hg. Peak gradient (S): 4mm Hg.    Tricuspid valve:    - Structurally normal valve.    Doppler:    - Transvalvular velocity was within the normal range. Mild    regurgitation.    Pulmonary artery:    - Systolic pressure was within the normal range.    Right atrium:    - The atrium was normal in size.    Pericardium:    - There was no pericardial effusion.    Systemic veins:  Inferior vena cava:    - The vessel was normal in size. The respirophasic diameter changes    were in the normal range (= 50%), consistent with normal central    venous pressure.    --------------------------------------------------------------------  Measurements     Left ventricle                               Value        Reference   LV ID, ED, PLAX                      (N)     4.0   cm     3.9 - 5.3   LV ID, ES, PLAX  3.0   cm     ---------   LV fx shortening, PLAX               (L)     24    %      27 - 45   LV mid-wall fx shortening, PLAX      (L)     11    %      15 - 23   LV fx shortening, PLAX chordal       (L)     25    %      27 - 45   LV PW thickness, ED, PLAX            (N)     0.9   cm     0.6 - 0.9   IVS/LV PW ratio, ED, PLAX                    1.06         ---------   LV end-diastolic volume              (N)     62    ml     56 -  104   LV end-systolic volume               (N)     27    ml     19 - 49   LV ejection fraction                 (N)     57    %      >=55   LV end-diastolic volume/bsa          (L)     28    ml/m^2 35 - 75   LV end-systolic volume/bsa           (N)     12    ml/m^2 12 - 30   LV e', lateral                               0.102 m/sec  ---------   LV E/e', lateral                             8            ---------   LV e', medial                                0.076 m/sec  ---------   LV E/e', medial                              11           ---------   LV e', average                               0.089 m/sec  ---------   LV E/e', average                             9            ---------  Ventricular septum                           Value        Reference   IVS thickness, ED, PLAX              (H)     1.0   cm     0.6 - 0.9      LVOT                                         Value        Reference   LVOT peak velocity, S                        0.98  m/sec  ---------      Aortic valve                                 Value        Reference   Aortic valve peak velocity, S                1.3   m/sec  ---------   Aortic mean gradient, S                      4     mm Hg  ---------   Aortic peak gradient, S                      7     mm Hg  ---------   Velocity ratio, peak, LVOT/AV                0.73         ---------      Aorta                                        Value        Reference   Aortic root ID, ED                   (N)     3.3   cm     <4.3      Left atrium                                  Value        Reference   LA ID, A-P, ES                       (N)     3.3   cm     2.7 - 3.8   LA ID/bsa, A-P                       (N)     1.5   cm/m^2 1.5 - 2.3      Mitral valve  Value        Reference   Mitral E-wave peak velocity                  0.82  m/sec  ---------   Mitral A-wave peak velocity                  1.01  m/sec  ---------   Mitral mean velocity, D                      0.52   m/sec  ---------   Mitral deceleration time                     264   ms     ---------   Mitral mean gradient, D                      1     mm Hg  ---------   Mitral peak gradient, D                      4     mm Hg  ---------   Mitral E/A ratio, peak                       0.8          ---------   Mitral A-wave VTI                            29.9  cm     ---------      Tricuspid valve                              Value        Reference   Tricuspid regurg peak velocity               2.6   m/sec  ---------   Tricuspid peak RV-RA gradient                26    mm Hg  ---------   Tricuspid maximal regurg velocity,           2.51  m/sec  ---------   PISA      Right ventricle                              Value        Reference   RV ID, ED, PLAX                              3.8   cm     ---------      Pulmonic valve                               Value        Reference   Pulmonic valve peak velocity, S              1     m/sec  ---------   Pulmonic valve mean velocity, S              0.68  m/sec  ---------   Pulmonic valve VTI,  S                        267.0 cm     ---------   Pulmonic mean gradient, S                    2     mm Hg  ---------   Pulmonic peak gradient, S                    4     mm Hg  ---------    Legend:  (L)  and  (H)  mark values outside specified reference range.    (N)  marks values inside specified reference range.    Danella Maiers, MD  [id number]     Signed by: Eda Keys, MD        Contrast echo (02/23/17):  Impressions: ??No evidence of apical thrombus on definity echo    ECG (03/22/17): normal sinus rhythm at 79 bpm     [employer] ILR interrogation performed in the office 07/06/17: DOI 04/07/17, sinus rhythm, R waves 0.2 mV, no AF. (see media section for scanned interrogation report).    ILR removed 04/09/21          Assessment/Plan   TIA.  Palpitations.  ILR implant 04/07/17.  No AF on interrogation.   ILR removed 04/09/21-removal site looks good  Follow up w/EP as  needed

## 2021-04-29 NOTE — Unmapped (Signed)
Follow-up as needed

## 2021-09-23 ENCOUNTER — Ambulatory Visit: Admit: 2021-09-23 | Discharge: 2021-09-28 | Payer: MEDICARE | Attending: Pulmonary Disease

## 2021-09-23 ENCOUNTER — Inpatient Hospital Stay: Admit: 2021-09-23 | Payer: MEDICARE | Attending: Pulmonary Disease

## 2021-09-23 DIAGNOSIS — R911 Solitary pulmonary nodule: Secondary | ICD-10-CM

## 2021-09-23 NOTE — Unmapped (Signed)
History of Present Illness:      Joanna Lopez is a 71 y.o. female.    HPI    I saw Joanna Lopez last time in October 2015  She had another CT chest done - was seen by her PCP who called me with results  She feels the same  No major changes since last time I saw he  Patient denies hemoptysis, increased shortness of breath, chest pains, headaches, nausea, vomiting, diarrhea  No fevers or chills. 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 The patient provided verbal consent for an over the phone visit.    I spent 15 to 19 minutes  on this call conducting an interview, performing a limited exam by phone, and educating the patient on my assessment and plan.  Doing well  No issues since last I saw her  No arhythmias on the loop recorder    03/19/2020  Doing well  Happy  Had ct chest today    10/03/2020  Doing well she will go to [place] and [place] on a cruise in July  Enjoys life  Patient denies hemoptysis, increased shortness of breath, chest pains, headaches, nausea, vomiting, diarrhea  No fevers or chills. No new skin rashes    03/25/21  Doing well  Had a wonderful trip to [place] and [place]  Had ct chest    09/23/2021  Had ct chest  The lung nodule is slowly growing  She is feeling great    Review of Systems   Constitutional: Positive for fatigue.   HENT: Positive for postnasal drip.    Respiratory: Positive for cough, shortness of breath and wheezing.    Gastrointestinal: Positive for  bloating and diarrhea.   Musculoskeletal: Positive for arthralgias.   Neurological: Positive for tremors.   Hematological: Bruises/bleeds easily.   Psychiatric/Behavioral: The patient is nervous/anxious.    All other systems reviewed and are negative.      Objective:   Blood pressure 118/88, pulse 86, height 5' 6 (1.676 m), weight 190 lb 12.8 oz (86.5 kg), SpO2 93 %.    Physical Exam    Nursing note and vitals reviewed.   Constitutional: Oriented to person, place, and time. Appears well-developed and well-nourished. No distress.   HENT: pupils equal, no scleral icterus, on conjunctivitis, no enlarged thyroid  Head: Normocephalic.    Ears: External ear normal.   Mouth/Throat: Oropharynx is clear and moist. No oropharyngeal exudate.   Eyes: Conjunctivae are normal. Pupils are equal, round, and reactive to light. Right eye exhibits no discharge. Left eye exhibits no discharge.   Neck: Normal range of motion. Neck supple. No JVD present. No tracheal deviation present. No thyromegaly present.   Cardiovascular: Normal rate, regular rhythm, normal heart sounds and intact distal pulses. Exam reveals no gallop and no  friction rub.   No murmur heard.   Pulmonary/Chest: Effort normal and breath sounds normal. No stridor. No respiratory distress. No wheezes. No rales. Exhibits no tenderness.   Abdominal:Exhibits no distension and no mass. There is no tenderness. There is no rebound and no guarding.   Musculoskeletal: exhibits no edema and no tenderness.   Neurological: alert and oriented to person, place, and time. normal reflexes. No cranial nerve deficit. Coordination normal.   Skin: Warm and dry. Not diaphoretic. No erythema.   Psychiatric: Normal mood and affect. Behavior is normal. Judgment and thought content normal.       Exam: CT CHEST WO CONTRAST dated 08/14/2016 10:17 AM EDT  ??  CLINICAL HISTORY: lung nodules;   ??  COMPARISON: February 07, 2014  ??  TECHNIQUE: Multidetector  CT imaging was obtained through the chest in  the supine position without intravenous contrast. The images were reconstructed with 5 and 1 mm collimation using a 38 cm field of view.  ??  FINDINGS: The lung windows show that the central airways are patent. Small calcified nodules reflect healed granulomatous infection.  ??  The well-defined noncalcified nodule in the medial segment right middle lobe measures approximately 2.1 x 1.8 cm on series 5 image 214, previously 1.7 x 1.5 cm on February 07, 2014. The central portion of this nodule is fluid attenuation.  ??  The irregular ill-defined subsolid nodule in the superior lingular subsegment of the left upper lobe on series 5 image 193 measures approximately 0.9 x 0.4 cm.  ??  A tiny subpleural nodule at the left apex laterally on series 5 image 73 is unchanged. There are no new lesions.  ??  The mediastinal windows show granulomatous calcification but otherwise no lymphadenopathy.  ??  No coronary artery calcifications are visible. The caliber of the great vessels is normal.  ??  Limited noncontrast imaging through the upper abdomen shows multiple low-attenuation liver lesions, similar to prior studies, likely cysts. A fluid attenuation lesion centrally in the left kidney is larger compared to 2014 but likely a peripelvic cyst.  ??  The bone windows show no suspicious lesions.  ??  ??  IMPRESSION:  ??  While predominantly fluid attenuation, the right middle lobe nodule is mildly increased in size compared to 3 years previously. This is most likely a benign lesion such as granuloma or hamartoma. The fluid attenuation makes carcinoid tumor less likely. Nevertheless, due to the interval growth a follow-up CT in 12 months should be considered.  ??  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 This can be followed at the same time.  ??  Report Verified by: Rick Duff, M.D. at 08/14/2016 12:53 PM EDT      EXAM: CT CHEST WO  CONTRAST   ??  INDICATION: Abnormal xray - lung nodule, < 1 cm, mod-high risk,   ??  COMPARISON: CT of the chest from 10/24/2018.  ??  TECHNIQUE: Multidetector CT imaging was obtained through the chest in the supine position without intravenous contrast. Additional axial MIP images were reconstructed.    ??  FINDINGS:   Medical devices: Loop recorder in the anterior left chest wall, unchanged.  ??  Airways & lungs: The central airways are patent. Stable postsurgical changes related to right middle lobectomy. Previously demonstrated pulmonary nodules are stable including the subsolid nodule in the lateral left upper lobe. No new or enlarging pulmonary nodules. Upper lobe  predominant emphysema, unchanged.  ??  Pleura: No pleural effusions or pneumothorax.  ??  Lower Neck: Unremarkable.  ??  Heart: The heart is normal in size.  ??  Vascular structures: Aorta is normal in caliber. Main pulmonary artery is normal in caliber.  ??  Mediastinum and hila: No pathologically enlarged lymph nodes.  ??  Chest wall and axilla: Unremarkable.  ??  Upper abdomen: Low-attenuation lesions in the liver, unchanged and most likely cysts.  ??  Musculoskeletal: No suspicious osteolytic or osteoblastic lesions.  ??  ??  IMPRESSION:  ??  1.  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 Recommend follow-up low-dose chest CT in 12 months.  ??  Report Verified by: Essie Hart, MD at 04/13/2019 3:03 PM EST    EXAM: CT CHEST WO CONTRAST     INDICATION: Lung nodule, > 8mm     COMPARISON: CT chest dated April 13, 2019, October 24, 2018, May 31, 2018, CT chest dated February 07, 2014.     TECHNIQUE: Multidetector CT imaging was obtained through the chest in the supine position without intravenous contrast. Additional axial MIP images were reconstructed. ??     FINDINGS:   Medical devices: Left apical there is seen in the subcutaneous tissue of the left anterior chest wall.      Airways & lungs: Stable postsurgical changes of right middle lobectomy. Central airways are patent. Mild apical predominant emphysema. Few scattered calcified granulomas. Seen is spiculated nodule in the left upper lobe with tiny central area of lucency measuring approximately 9 x 6 mm (image #184, series 4), equivocally increase in size compared to study dated May 10, 2019 and definitely increased in size compared to study dated February 07, 2014. Additional subsolid nodule appears unchanged. No new pulmonary nodule. Bibasilar atelectasis. Lungs are otherwise clear.     Pleura: No pleural effusions or pneumothorax.     Lower Neck: Unremarkable.     Heart: The heart is normal in size.     Vascular structures: Mild calcification of aorta and branch vessels. The vascular structures otherwise unremarkable. Main pulmonary artery is normal in caliber.     Mediastinum and hila: No pathologically enlarged lymph nodes. Calcified right hilar and mediastinal lymph nodes.     Chest wall and axilla: Unremarkable.     Upper abdomen: Again seen are multiple hepatic cyst.     Musculoskeletal: No suspicious osteolytic or osteoblastic lesions.     Impression   IMPRESSION:     1. ??Subtle increase in size of subsolid nodule in the left upper lobe measuring 9 x 6 mm. Follow-up is suggested in 6 months to ensure stability.   2. ??Mild emphysema.       Report Verified by: Lana Fish, MD at 03/19/2020 2:15 PM EST         Assessment:     Lung nodules, carcinoid, s/p resection VATS RML  Lung nodule LUL  ??  Plan:   ??  Patient with a large nodule RML that on path was typical carcinoid  The LUL nodule is maybe slightly denser, images reviewed, discussed with patient, still question of adenocarcinoma  Location is difficult for a wedge. 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 Discussed to see Dr Harriette Bouillon   PFTs reviewed, will repeat at this time  ??  Overall clinically she looks very stable  ??

## 2021-10-02 ENCOUNTER — Ambulatory Visit: Admit: 2021-10-02 | Discharge: 2021-10-02 | Payer: MEDICARE

## 2021-10-02 DIAGNOSIS — R911 Solitary pulmonary nodule: Secondary | ICD-10-CM

## 2021-10-02 NOTE — Unmapped (Signed)
Subjective   No chief complaint on file.      HPI:   Patient ID: Joanna Lopez is a 71 y.o. female.    HPI:  Joanna Lopez is a 71y/o female who was referred back for evaluation of an enlarging left upper lobe nodule. 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 She has been followed by Dr. Damaris Hippo and I last saw her in 2019.     A recent Chest CT on 09/23/21 showed an increase in the left upper lobe nodule, measuring 11 X 8mm, previously 9 X 7mm. She feels well. She is active and plays Pickle ball.     ECOG Performance Status:Normal activity - asymptomatic       Past Medical History:   Diagnosis Date   ??? Anxiety    ??? Basal cell cancer    ??? Hyperlipidemia    ??? Hypertension    ??? Lung nodule    ??? OSA (obstructive sleep apnea)    ??? RLS (restless legs syndrome)      Past Surgical History:   Procedure Laterality Date   ??? APPENDECTOMY     ??? COLONOSCOPY N/A 01/23/2013    Procedure: COLONOSCOPY WITH MAC;  Surgeon: Lurene Shadow, MD;  Location: [facility] ENDOSCOPY;  Service: Gastroenterology;  Laterality: N/A;   ??? FLEXIBLE BRONCHOSCOPY W/ UPPER ENDOSCOPY N/A 09/17/2016    Procedure: BRONCHOSCOPY SUPER D WITH FLUORO AND BIOPSY-EBUS/NAVI;  Surgeon: Johnny Bridge, MD;  Location: [facility] ENDOSCOPY;  Service: Pulmonary;  Laterality: N/A;   ??? LOOP IMPLANT IMPLANTATION N/A 04/07/2017    Procedure: Loop  Implantation;  Surgeon: Tennis Must, MD;  Location: [facility] ELECTROPHYSIOLOGY LAB;  Service: Electrophysiology;  Laterality: N/A;   ??? LOOP REMOVAL Left 04/09/2021    Procedure: Loop Removal;  Surgeon: Lawerance Cruel, MD;  Location: [facility] MIS SUITE;  Service: Electrophysiology;  Laterality: Left;   ??? submastoid gland removal     ??? THORACOSCOPY Right 11/23/2016    Procedure: RIGHT SIDED VATS MIDDLE LOBE LOBECTOMY,HILAR MEDIASTINAL LYMPH NODE DISSECTION;  Surgeon: Janett Billow, MD;  Location: [facility] OR;  Service: Thoracic;  Laterality: Right;   ??? TONSILLECTOMY     ???  UVULOPALATOPHARYNGOPLASTY       Family History   Adopted: Yes   Problem Relation Age of Onset   ??? Diabetes Son      Social History     Tobacco Use   ??? Smoking status: Never   ??? Smokeless tobacco: Never   Substance Use Topics   ??? Alcohol use: Yes     Alcohol/week: 2.0 standard drinks     Types: 2 Glasses of wine per week     Comment: 2-3 wine/week     Allergies   Allergen Reactions   ??? Penicillins Anaphylaxis     Other reaction(s): Facial Swelling  Has patient had a PCN reaction causing immediate rash, facial/tongue/throat swelling, SOB or lightheadedness with hypotension: Yes  Has patient had a PCN reaction causing severe rash involving mucus membranes or skin necrosis: Yes  Has patient had a PCN reaction that required hospitalization: No  Has patient had a PCN reaction occurring within the last 10 years: No  If all of the above answers are NO, then may proceed with Cephalosporin use.   ??? Adhesive      Other reaction(s): breaks out and itches if on for long time   ??? Formaldehyde Analogues    ??? Promethazine Hcl  Restless legs   ??? Latex Rash     States she has a sensitivity to latex after being around it for days, she gets a rash: she states that when she wore her cpap machine for 14 days she developed a rash on her face other than this she has no problems stating her dentist wears latex gloves and she has no problems with latex gloves.    ??? Sulfa (Sulfonamide Antibiotics) Rash     Current Outpatient Medications   Medication Sig   ??? acetaminophen Take 3 tablets (975 mg total) by mouth every 6 hours as needed.   ??? aspirin Take 1 tablet (325 mg total) by mouth daily.   ??? aspirin-acetaminophen-caffeine Take 2 tablets by mouth every 6 hours as needed for Pain.   ??? atorvastatin Take 1 tablet (10 mg total) by mouth daily.   ??? atorvastatin Take 1 tablet (80 mg total) by mouth at bedtime.   ??? calcium-vitamin D Take 1 tablet by mouth 2 times a day with meals.   ??? lisinopriL Take 0.5 tablets (10 mg total) by mouth daily.  10 mg (half tablet) daily   ??? melatonin Take 1 tablet (3 mg total) by mouth at bedtime. PRN for sleep   ??? multivitamin Take 1 tablet by mouth daily.   ??? PARoxetine Take 1 tablet (20 mg total) by mouth daily.   ??? roPINIRole Take 1 tablet (0.25 mg total) by mouth daily with dinner. Take one tablet (0.25 mg) 1-2 hours before bedtime, for three days, then take 2 tablets (0.5 mg) 1-2 hours before bedtime for four days, then take 4 tablets (1 mg) 1-2 hours before bedtime thereafter   ??? cyclobenzaprine Take 1 tablet (10 mg total) by mouth 3 times a day as needed.   ??? dicyclomine Take 1 tablet (20 mg total) by mouth 4 times a day with meals and at bedtime.   ??? lidocaine Place 1 patch onto the skin daily. Apply patch for 12 hours and then remove patch and leave off for 12 hours.   ??? lipase-protease-amylase Take 2 capsules po with each meal. (Patient taking differently: Take 2 capsules po with each meal.  Taking PRN Indications: exocrine pancreatic insufficiency)     No current facility-administered medications for this visit.     Cancer Staging:    Cancer Staging   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)  Staging form: Lung, AJCC 8th Edition  - Clinical: No stage assigned - Unsigned  - Pathologic stage from 12/11/2016: Stage IA2 (pT1b, pN0, cM0) - Signed by Janett Billow, MD on 12/11/2016      ROS:   Review of Systems   Constitutional: Negative for chills, fatigue and fever.   HENT: Negative for congestion and sinus pressure.    Eyes: Negative for pain and discharge.   Respiratory: Negative for cough, shortness of breath and wheezing.    Cardiovascular: Negative for chest pain, palpitations and leg swelling.   Gastrointestinal: Negative for constipation, diarrhea, nausea and vomiting.   Genitourinary: Positive for nocturia. Negative for frequency and urgency.   Musculoskeletal: Negative for back pain, gait problem and neck pain.   Skin: Negative for wound.   Neurological: Negative for tremors, weakness and  headaches.   Hematological: Bruises/bleeds easily.   Psychiatric/Behavioral: Negative for depression. The patient is nervous/anxious.    I have reviewed the ROS and agree with the findings documented above.     BP 128/82    Pulse 80  Ht 5' 6 (1.676 m) Comment: verbal per pt   Wt 193 lb 9.6 oz (87.8 kg)    SpO2 96%    BMI 31.25 kg/m??     Objective:   Physical Exam  Constitutional:       General: She is not in acute distress.  Pulmonary:      Effort: Pulmonary effort is normal. No respiratory distress.      Breath sounds: No wheezing.   Skin:     General: Skin is warm.      Coloration: Skin is not jaundiced.   Neurological:      General: No focal deficit present.      Mental Status: She is alert and oriented to person, place, and time.   Psychiatric:         Mood and Affect: Mood normal.         Behavior: Behavior normal.          Imaging:     CT CHEST WO CONTRAST 09/23/2021     NODULES: Slight interval increased size of the subsolid left upper lobe nodule which measures roughly 11 x 8 mm (image 144 series 302), previously 9 x 7 mm. This continues to demonstrates slight increase in size and density from the June 2020 examination. No new suspicious pulmonary nodules.      IMPRESSION:     Slight interval increase size of the left upper lobe subsolid nodule, which remains indeterminate but is concerning for an indolent primary lung malignancy. Continued follow-up is recommended.          PFT:     FEV1   Date Value Ref Range Status   10/24/2018 2.58  Final   10/15/2016 2.92  Final     FEV1%   Date Value Ref Range Status   10/24/2018 102  Final   10/15/2016 113  Final     FVC   Date Value Ref Range Status   10/24/2018 3.33  Final   10/15/2016 3.58  Final     FVC%   Date Value Ref Range Status   10/24/2018 100  Final   10/15/2016 106  Final     FEV1/FVC   Date Value Ref Range Status   10/24/2018 77  Final   10/15/2016 82  Final     FEV1/FVC EXP   Date Value Ref Range Status   10/24/2018 76  Final   10/15/2016 77  Final      RESPONSE TO BRONCHODILATOR   Date Value Ref Range Status   10/24/2018 % FVC, % FEV1  Final   10/15/2016 -1% FVC, -3% FEV1  Final     TLC   Date Value Ref Range Status   10/24/2018 5.00  Final   10/15/2016 5.74  Final     TLC%   Date Value Ref Range Status   10/24/2018 92  Final   10/15/2016 105  Final     RV   Date Value Ref Range Status   10/24/2018 1.81  Final   10/15/2016 2.04  Final     RV%   Date Value Ref Range Status   10/24/2018 85  Final   10/15/2016 97  Final     VC   Date Value Ref Range Status   10/24/2018 3.20  Final   10/15/2016 3.70  Final     VC%   Date Value Ref Range Status   10/24/2018 96  Final   10/15/2016 110  Final     DLCO  Date Value Ref Range Status   10/24/2018 18.22  Final   10/15/2016 17.52  Final     DLCO%   Date Value Ref Range Status   10/24/2018 77  Final   10/15/2016 73  Final       Pathology:   Right VATS middle lobectomy 11/23/16    A. ????Lymph node, level 10, excision:  ??????????- One benign lymph node with reactive change (0/1).  ??????????- Negative for atypia or malignancy.    B. ????Lymph node, level 11, excision:  ??????????- Two benign lymph nodes with reactive change (0/2).  ??????????- Negative for atypia or malignancy.    C. ????Lung, right middle lobe, lobectomy:  - Well differentiated neuroendocrine tumor, Grade 1 (Typical carcinoid  tumor; 2 cm greatest dimension).  -Pathologic tumor stage: ??pT1b, pN0, pMX.  - Ki-67 index is 2%; mitosis is less than 2 per two millimeter square.  - No evidence of necrosis.  ????????????????????- Margin of resection is free of tumor.  ????????????????????- Please see Synoptic Report.  ??????????- Background lung parenchyma shows emphysematous change.  ??????????- Six benign lymph nodes with reactive change (0/6).    D. ????Lymph node, level 7, excision:  ??????????- Two benign lymph nodes with reactive change (0/2).  ??????????- Negative for metastatic disease.    E. ????Lymph node, level 4R, excision:  ??????????- One benign lymph node with reactive change(0/1).  ??????????- Negative for metastatic  disease.      SYNOPTIC REPORT  Procedure  Lobectomy  Specimen Laterality  Right  Tumor Site  Right middle lobe  Tumor Size  Greatest dimension (centimeters): ??2 x 1.5 x 1 cm  Tumor Focality  Single focus  Histologic Type  Typical carcinoid tumor  Histologic Grade  G1: Well differentiated  Spread Through Air Spaces (STAS)  Not identified  Visceral Pleura Invasion  Not identified  Lymphovascular Invasion  Not identified  Direct Invasion of Adjacent Structures  No adjacent structures present  Margins  All margins are uninvolved by tumor  ??????????Margins examined (specify): ??Bronchial  ??????????Distance of invasive carcinoma from closest margin (centimeters): ??1.3  cm  Treatment Effect  No known presurgical therapy  Regional Lymph Nodes  Lymph Node Examination  Number of Lymph Nodes Involved: ??0  Number of Lymph Nodes Examined: ??12 as follows: ??Level 10 - 0/1, level 11 -  0/2, level 7 - 0/2, level 4R - 0/1 and peribronchial - 0/6.  Pathologic Stage Classification (pTNM, AJCC 8th Edition)  Primary Tumor (pT)  pT1b: ??????????????????Tumor >1 cm, but less than or equal to 2 cm in greatest  dimension  Regional Lymph Nodes (pN)  pN0: No regional lymph node metastasis  Distant Metastasis (pM)  pMX  Additional Pathologic Findings  Emphysematous change  Ancillary Studies  Tumor cells are positive for synaptophysin and chromogranin and show a  Ki-67 index is 2%, findings support typical carcinoid tumor. ??There is no  evidence of necrosis or increased mitosis or atypia.    Comment(s): ??Patient's history of enlarging right middle lobe nodule is  noted. The sections show well ciecumscribed neuroendocrine tumor  (synaptophysin and chromogranin positive ) with less than 2 mitosis per 2  millimeter square, ki-67 index of 2% and does not show areas of necrosis.  The histologic and immunphenotypic features are of typical carcinoid tumor  (Grade1).     Treatment:   Right VATS middle lobectomy 11/23/16     Assessment/Plan:   Ms. 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 I have reviewed her recent imaging and records. The nodule is concerning for a lung cancer given it's continued growth over multiple CTs. We discussed the options of an attempted biopsy, surgical resection and SBRT. It is not amenable to a wedge resection; however, a lingular segmentectomy may be possible.    I discussed the risks (including, but not limited to, death, pneumonia, bleeding, prolonged ventilatory support, atrial fibrillation, infection, prolonged air leak), benefits and alternatives to proceeding with a left VATS lingulectomy, possible left upper lobectomy. I discussed the expected postoperative course and recovery. All questions were answered and she wishes to proceed with surgery. She will be evaluated by [facility] in [facility] for preoperative assessment. I discussed liposomal bupivacaine for postoperative analgesia.     She has PFTs pending. She will decrease her aspirin to 81mg  7 days prior to surgery. Surgery is scheduled on July 11th at [facility] per her preference.       Cc:  [name], MD - PCP  Rhea Bleacher, MD - pulmonary  ??

## 2021-10-04 MED ORDER — lidocaine (PF) 2% (20 mg/mL) Soln 20 mg
20 | Freq: Once | INTRAMUSCULAR | Status: AC | PRN
Start: 2021-10-04 — End: 2021-10-04

## 2021-11-03 ENCOUNTER — Ambulatory Visit: Payer: MEDICARE

## 2021-11-06 ENCOUNTER — Inpatient Hospital Stay: Admit: 2021-11-06 | Discharge: 2021-11-10 | Payer: MEDICARE | Attending: Pulmonary Disease

## 2021-11-06 DIAGNOSIS — R911 Solitary pulmonary nodule: Secondary | ICD-10-CM

## 2021-11-06 LAB — PFT13-PULMONARY FUNCTION TEST
DLCO%: 84
DLCO: 16.86
FEV1%: 114
FEV1/FVC EXP: 64
FEV1/FVC: 78
FEV1: 2.67
FVC%: 112
FVC: 3.41
RESPONSE TO BRONCHODILATOR: 2
RV%: 95
RV: 2.02
TLC%: 99
TLC: 5.32
VC%: 102
VC: 3.3

## 2021-11-06 MED ORDER — albuterol (PROVENTIL) nebulizer solution 2.5 mg
2.5 | Freq: Once | RESPIRATORY_TRACT | Status: AC
Start: 2021-11-06 — End: 2021-11-06
  Administered 2021-11-06: 15:00:00 2.5 mg via RESPIRATORY_TRACT

## 2021-11-06 MED FILL — ALBUTEROL SULFATE 2.5 MG/3 ML (0.083 %) SOLUTION FOR NEBULIZATION: 2.5 2.5 mg /3 mL (0.083 %) | RESPIRATORY_TRACT | Qty: 3

## 2021-11-10 ENCOUNTER — Ambulatory Visit: Admit: 2021-11-10 | Discharge: 2022-01-03 | Payer: MEDICARE | Attending: Family

## 2021-11-10 DIAGNOSIS — R911 Solitary pulmonary nodule: Secondary | ICD-10-CM

## 2021-11-10 LAB — BASIC METABOLIC PANEL
Anion Gap: 7 mmol/L (ref 3–16)
BUN: 20 mg/dL (ref 7–25)
CO2: 30 mmol/L (ref 21–33)
Calcium: 9.3 mg/dL (ref 8.6–10.3)
Chloride: 105 mmol/L (ref 98–110)
Creatinine: 1.12 mg/dL (ref 0.60–1.30)
EGFR: 53
Glucose: 93 mg/dL (ref 70–100)
Osmolality, Calculated: 296 mOsm/kg (ref 278–305)
Potassium: 4.2 mmol/L (ref 3.5–5.3)
Sodium: 142 mmol/L (ref 133–146)

## 2021-11-10 LAB — ANTIBODY SCREEN: Antibody Screen: NEGATIVE

## 2021-11-10 LAB — CBC
Hematocrit: 40.6 % (ref 35.0–45.0)
Hemoglobin: 13.7 g/dL (ref 11.7–15.5)
MCH: 30.8 pg (ref 27.0–33.0)
MCHC: 33.7 g/dL (ref 32.0–36.0)
MCV: 91.4 fL (ref 80.0–100.0)
MPV: 9.4 fL (ref 7.5–11.5)
Platelets: 169 10*3/uL (ref 140–400)
RBC: 4.44 10*6/uL (ref 3.80–5.10)
RDW: 13.7 % (ref 11.0–15.0)
WBC: 7.8 10*3/uL (ref 3.8–10.8)

## 2021-11-10 LAB — ABO/RH: Rh Type: POSITIVE

## 2021-11-10 NOTE — Unmapped (Addendum)
[facility] for Perioperative Care    We're pleased that you have chosen [facility] for your upcoming procedure.  The staff serving you is professionally trained to provide the highest quality care.  We encourage you to ask questions and to let the staff know your special needs.  We want your visit to be as comfortable as possible. Below you will find instructions for your upcoming procedure. Please read the instructions and follow closely, failure to follow instructions may lead to delay or cancellation of your surgery.     Your procedure is scheduled on TUESDAY 7/11.    Please arrive at 9:45 AM.    Location: Come in the main entrance of the hospital and the registration desk will be on your right as soon as you enter the lobby.   We have free valet parking from 6 AM to 630 PM.     [facility]        [address]  [place], [place] [address]    Surgery area phone number:   [phone]       OTHER USEFUL INFORMATION [facility])     If you have any questions about these instructions please call us at:    [facility] for Perioperative [facility]  Monday - Friday 8:00 am - 4:30 pm  [phone]    For questions regarding surgery, please call your surgeon.        ** please see attatch Incentive Spirometer instruction sheet and use incentive spirometer which was provided to you at this appt leading up to your surgery & bring with you on your day of surgery.     GENERAL INFORMATION BEFORE SURGERY     ?STOP shaving in the area of the surgery for the 3 days prior.   If needed, a trained staff member will clip the area immediately before your surgery.    Contact your surgeon if you develop any of the following before surgery:  A fever or symptoms of an Upper Respiratory Infection.  A skin rash/wound/lesion.  A cold or are sick ANYTIME.    If you smoke, quit smoking as far in advance of surgery as possible.   Patients who quit at least 30 days before surgery may have better  outcomes.      2 DAYS BEFORE PROCEDURE     On Sunday 7/9 , start the following:    CHLORHEXIDINE (CHG)/SURGICAL SCRUB: You have been provided Chlorhexidine for preop shower. You will use this soap to wash your entire body once a day for 2 days prior to surgery and the morning of surgery. DO NOT APPLY THIS SOAP ABOVE YOUR NECK OR IN THE GENITAL AREA. You may use your regular soap in these areas. You may use the antibacterial lotion starting 2 days before surgery, but do not use any lotion the morning of surgery.       MEDICATIONS     ? OK TO TAKE     ACETAMINOPHEN (TYLENOL): This is the only over the counter pain medication that is ok to take before surgery.   Vitamins to treat a specific deficiency: ex. Vitamins B, C and D, Calcium, Folate, Iron, Magnesium and Potassium.    ? ONE WEEK PRIOR TO YOUR PROCEDURE STOP/ DO NOT TAKE          (the following are held due to increased risk of bleeding).    MULTIVITAMINS  SUPPLEMENTS/HERBAL SUPPLEMENTS: Glucosamine-Chondroitin, Garlic, Ginseng, Ginko Biloba, Vitamin E,  Echinacea, Turmeric, Saw Palmetto, St. John's Wort, Herbal tea  CANNABIS PRODUCTS: ex. CBD oil, Gummies or edibles, THC     NSAIDS (non-steroidal anti-inflammatories such as Ibuprofen, Advil, Aleve, Naproxen,                       Excedrin, Meloxicam/Mobic, Celoxib/Celebrex, oral Diclofenac/Voltaren)      BLOOD THINNERS     Aspirin: Please decrease your Aspirin from 325 mg daily to 81 mg daily and take this new dose leading up to surgery. We have reached out to Dr Harriette BouillonStarnes office to verify that this is ok to take leading up to surgery and we will only call you if Dr Harriette BouillonStarnes says it needs to be stopped            CONTINUE ALL OTHER PRESCRIBED HOME MEDICATIONS   UNLESS OTHERWISE INSTRUCTED.        ** please do NOT take you Lisinopril the night prior to surgery **          MORNING OF SURGERY    DO NOT EAT OR DRINK ANYTHING (including gum, mints, water, etc) after midnight.    ?TAKE ONLY these medications the morning  of surgery with a small sip of water     none       PLEASE BRUSH YOUR TEETH    SHOWER: Use Chlorhexidine as instructed above    ?Remove all makeup, nail polish, jewelry, wristwatch, body piercings.  ?Do not use powder, lotions, deodorant, and perfume/cologne.     WHAT TO WEAR: Wear casual, loose fitting, and comfortable clothing.    Bring with you:  ? List of your medications, dose and how often you take the medication.  ? List of over-the-counter medications and supplements, dose and how often you take the       medication.    ? Photo ID  ? Insurance card     ? Glasses and case  ? Dentures and case   ? Hearing aids and case      ? For OVERNIGHT stay - pack a small bag of items that you may need                                             (ex. change of clothing, toiletries, phone charger)             - Locker space is limited in the surgery area.    Leave at home:    We recommend that you leave valuables (ex. money, jewelry, credit cards) at home or     with your family.   ? Do not bring any medications to the hospital. (Exception: rescue inhaler, transplant medications, Parkinson's or seizure medication.)  ? CONTACT LENSES- leave at home or bring a case for safe keeping.      TRANSPORTATION and VISITORS     Please make transportation arrangements and bring ONE responsible adult to accompany you home and remain with you for 24 hours after having received anesthesia.              You will NOT be permitted to drive.  You may take a taxi, uber, etc., only if accompanied by a familiar adult.    VISITING HOURS: Same Day Surgery/Pre-Anesthesia: Open to two visitors per patient, from 5:30 a.m. to 7 p.m.      ICU and  Stepdown units: Open to two visitors per patient at a time, from 2 p.m. to 7 p.m.    Inpatient units: Open to any number of visitors 24/7.        WHAT TO EXPECT     Make sure all of your health care givers are checking your ID bracelet and verifying your name and date of birth.  You will actively be involved  in verifying the type of surgery you are having and the correct site.      Your health care givers should be cleaning their hands with soap and water or antibacterial foam before taking care of you and if they do not, it is ok to remind them to do so.  Antibacterial showering and good hand hygiene are essential to prevent surgical site infections and reduce the spread of MRSA.     In an effort to reduce the risks of blood clots after your surgery you may have compression sleeves on your lower legs.  These sleeves help facilitate circulation and decrease the chances of developing any blood clots.     You may be given an incentive spirometer after surgery to use every hour to help prevent pneumonia by having you take deep breaths in and out.  You will be given instructions about proper use after surgery.

## 2021-11-10 NOTE — Unmapped (Signed)
PRE-OPERATIVE HISTORY AND PHYSICAL      [facility] NP / PA: Ria Comment, CNP      Date of Procedure: 11/18/21  Surgeon:  Dr. Harriette Bouillon      Diagnosis:  Lung nodule  Procedure:  Left thoracoscopic lingulectomy, possible lobectomy, possible thoracotomy    Patient ID: Joanna Lopez is a 71 y.o. female.    Chief Complaint   Patient presents with    Pre-op Exam     Referral Indication: Risk stratification  History of Present Illness:  71 yo female with history of HTN, HLD, OSA (s/p UPPP), TIA, RLS, lung cancer (s/p right VATS middle lobectomy in 11/2016), with lung nodule who is being seen pre-op prior to left thoracoscopic lingulectomy, possible lobectomy, possible thoracotomy.       Medical History:     Past Medical History:   Diagnosis Date    Anxiety     Basal cell cancer     Hyperlipidemia     Hypertension     Lung nodule     OSA (obstructive sleep apnea)     RLS (restless legs syndrome)        Surgical History:     Past Surgical History:   Procedure Laterality Date    APPENDECTOMY      COLONOSCOPY N/A 01/23/2013    Procedure: COLONOSCOPY WITH MAC;  Surgeon: Lurene Shadow, MD;  Location: [facility] ENDOSCOPY;  Service: Gastroenterology;  Laterality: N/A;    FLEXIBLE BRONCHOSCOPY W/ UPPER ENDOSCOPY N/A 09/17/2016    Procedure: BRONCHOSCOPY SUPER D WITH FLUORO AND BIOPSY-EBUS/NAVI;  Surgeon: Johnny Bridge, MD;  Location: [facility] ENDOSCOPY;  Service: Pulmonary;  Laterality: N/A;    LOOP IMPLANT IMPLANTATION N/A 04/07/2017    Procedure: Loop  Implantation;  Surgeon: Tennis Must, MD;  Location: [facility] ELECTROPHYSIOLOGY LAB;  Service: Electrophysiology;  Laterality: N/A;    LOOP REMOVAL Left 04/09/2021    Procedure: Loop Removal;  Surgeon: Lawerance Cruel, MD;  Location: [facility] MIS SUITE;  Service: Electrophysiology;  Laterality: Left;    submastoid gland removal      THORACOSCOPY Right 11/23/2016    Procedure: RIGHT SIDED VATS MIDDLE LOBE LOBECTOMY,HILAR MEDIASTINAL LYMPH NODE DISSECTION;  Surgeon: Janett Billow, MD;   Location: [facility] OR;  Service: Thoracic;  Laterality: Right;    TONSILLECTOMY      UVULOPALATOPHARYNGOPLASTY         Family History:     Family History   Adopted: Yes   Problem Relation Age of Onset    Diabetes Son        Allergies:     Allergies   Allergen Reactions    Penicillins Anaphylaxis     Other reaction(s): Facial Swelling  Has patient had a PCN reaction causing immediate rash, facial/tongue/throat swelling, SOB or lightheadedness with hypotension: Yes  Has patient had a PCN reaction causing severe rash involving mucus membranes or skin necrosis: Yes  Has patient had a PCN reaction that required hospitalization: No  Has patient had a PCN reaction occurring within the last 10 years: No  If all of the above answers are NO, then may proceed with Cephalosporin use.    Adhesive      Other reaction(s): breaks out and itches if on for long time    Formaldehyde Analogues     Promethazine Hcl      Restless legs    Latex Rash     States she has a sensitivity to latex after being around it for days, she gets a rash: she  states that when she wore her cpap machine for 14 days she developed a rash on her face other than this she has no problems stating her dentist wears latex gloves and she has no problems with latex gloves.     Sulfa (Sulfonamide Antibiotics) Rash       Medications:     Prior to Admission medications taking for visit date 11/10/21   Medication Sig Taking? Authorizing Provider   acetaminophen (TYLENOL) 325 MG tablet Take 3 tablets (975 mg total) by mouth every 6 hours as needed. Yes Perry Mount, CNP   aspirin 325 MG tablet Take 1 tablet (325 mg total) by mouth daily. Yes Hema Alessandra Grout, MD   aspirin-acetaminophen-caffeine [facility]) 250-250-65 mg per tablet Take 2 tablets by mouth every 6 hours as needed for Pain. Yes Historical Provider, MD   atorvastatin (LIPITOR) 10 MG tablet Take 1 tablet (10 mg total) by mouth daily. Yes Historical Provider, MD   calcium-vitamin D (OSCAL-500 + D) 500 mg-5 mcg (200  unit) per tablet Take 1 tablet by mouth 2 times a day with meals. Yes Historical Provider, MD   lidocaine (LIDODERM) 5 % Place 1 patch onto the skin daily. Apply patch for 12 hours and then remove patch and leave off for 12 hours. Yes Cherly Anderson, MD   lipase-protease-amylase (CREON) 24,000-76,000 -120,000 unit CpDR Take 2 capsules po with each meal.  Patient taking differently: Take 2 capsules po with each meal.  Taking PRN Indications: exocrine pancreatic insufficiency Yes Elias Else, CNP   lisinopril (PRINIVIL,ZESTRIL) 20 MG tablet Take 0.5 tablets (10 mg total) by mouth daily. 10 mg (half tablet) daily Yes Historical Provider, MD   melatonin 3 mg Tab Take 1 tablet (3 mg total) by mouth at bedtime. PRN for sleep Yes Historical Provider, MD   multivitamin [facility]) tablet Take 1 tablet by mouth daily. Yes Historical Provider, MD   PARoxetine (PAXIL) 20 MG tablet Take 1 tablet (20 mg total) by mouth daily. Yes Historical Provider, MD   roPINIRole (REQUIP) 0.25 MG tablet Take 1 tablet (0.25 mg total) by mouth daily with dinner. Take one tablet (0.25 mg) 1-2 hours before bedtime, for three days, then take 2 tablets (0.5 mg) 1-2 hours before bedtime for four days, then take 4 tablets (1 mg) 1-2 hours before bedtime thereafter Yes Georgena Spurling, MD   atorvastatin (LIPITOR) 80 MG tablet Take 1 tablet (80 mg total) by mouth at bedtime.  Hema Alessandra Grout, MD   cyclobenzaprine (FLEXERIL) 10 MG tablet Take 1 tablet (10 mg total) by mouth 3 times a day as needed.  Cherly Anderson, MD   dicyclomine (BENTYL) 20 mg tablet Take 1 tablet (20 mg total) by mouth 4 times a day with meals and at bedtime.  Historical Provider, MD        Review of Systems   Constitutional:  Positive for fatigue. Negative for activity change, appetite change, chills and fever.   HENT:  Negative for dental problem, ear pain, hearing loss, sore throat, tinnitus and trouble swallowing.    Eyes:  Negative for pain, discharge, redness, itching  and visual disturbance (improved after bilateral cataract extraction in 07/2021).   Respiratory:  Positive for cough (URI several weeks ago, resolving cough). Negative for chest tightness, shortness of breath and wheezing.    Cardiovascular:  Negative for chest pain, palpitations and leg swelling.        Denies orthopnea, however sleeps on side (as she can't fall asleep on her  back, and if she does she states she doesn't go into REM sleep). Pt reports she plays volleyball for an hour once a week and plays pickleball for an hour twice a month. Pt reports she walks her dog ~8 blocks three times a day without SOB (unless it's hot and she's wearing a mask). Pt denies SOB walking around at home, showering, dressing, or walking into OV today. Pt reports SOB vacuuming and going up 1 flight of stairs (rarely goes up stairs, at doc appts or the mall). Pt denies worsening SOB since lung surgery in 2018. Denies chest pain with activity or at rest.   Gastrointestinal:  Negative for abdominal pain, blood in stool, constipation, diarrhea, heartburn, nausea and vomiting.   Genitourinary:  Negative for difficulty urinating, frequency, hematuria and urgency.   Musculoskeletal:  Positive for back pain. Negative for arthralgias, gait problem, neck pain and neck stiffness.   Skin:  Negative for rash and wound.   Neurological:  Negative for dizziness, seizures, syncope, weakness, light-headedness, numbness and headaches.   Hematological:  Bruises/bleeds easily.        Denies DVT/PE   Psychiatric/Behavioral:  Negative for depression and sleep disturbance. The patient is not nervous/anxious.      Objective:   Blood pressure 132/84, pulse 79, temperature 98 F (36.7 C), temperature source Oral, resp. rate 16, height 5' 6 (1.676 m), weight 192 lb 11.2 oz (87.4 kg), SpO2 96 %.    Physical Exam  Constitutional:       General: She is not in acute distress.     Appearance: She is well-developed. She is not diaphoretic.      Comments: Body mass  index is 31.1 kg/m.   HENT:      Head: Normocephalic and atraumatic.      Right Ear: External ear normal.      Left Ear: External ear normal.      Nose: Nose normal.      Mouth/Throat:      Pharynx: No oropharyngeal exudate.      Comments: S/p UPPP  Eyes:      General: No scleral icterus.        Right eye: No discharge.         Left eye: No discharge.      Conjunctiva/sclera: Conjunctivae normal.      Pupils: Pupils are equal, round, and reactive to light.   Neck:      Thyroid: No thyromegaly.      Vascular: No JVD.      Trachea: No tracheal deviation.   Cardiovascular:      Rate and Rhythm: Normal rate and regular rhythm.      Pulses:           Radial pulses are 2+ on the right side and 2+ on the left side.      Heart sounds: Normal heart sounds. No murmur heard.    No friction rub. No gallop.   Pulmonary:      Effort: No respiratory distress.      Breath sounds: Normal breath sounds. No stridor. No wheezing or rales.   Abdominal:      General: Bowel sounds are normal. There is no distension.      Palpations: Abdomen is soft.      Tenderness: There is no abdominal tenderness. There is no guarding or rebound.   Musculoskeletal:         General: No tenderness or deformity. Normal range of motion.  Cervical back: Normal range of motion and neck supple.      Comments: Strength: 5/5 BUE, BLE   Lymphadenopathy:      Cervical: No cervical adenopathy.   Skin:     General: Skin is warm and dry.   Neurological:      Mental Status: She is alert and oriented to person, place, and time.      Cranial Nerves: No cranial nerve deficit.   Psychiatric:         Behavior: Behavior normal.         Thought Content: Thought content normal.         Judgment: Judgment normal.       Lab Review:   BMP:       Invalid input(s): AGAP, GLU, CREAT, GFRNA  CBC:       Lab name 11/10/21  1106   WBC 7.8   HEMOGLOBIN 13.7   HEMATOCRIT 40.6   MCH 30.8   PLATELETS 169     COAGS:     RENAL:    HEP:     HGBA1C:         Study Results:     PFT's  11/06/21      Component 11/06/21 1035   FVC 3.41   FVC% 112   FEV1 2.67   FEV1% 114   FEV1/FVC 78   FEV1/FVC EXP 64   VC 3.30   VC% 102   RV 2.02   RV% 95   TLC 5.32   TLC% 99   DLCO 16.86   DLCO% 84   RESPONSE TO BRONCHODILATOR 2% FVC, 3% FEV1        Spirometry is normal.   There is no response to bronchodilator demonstrated.   Lung volumes are normal.   The flow-volume loop is normal.   Diffusing capacity is normal.     There have been no significant changes since test from 2020.     CT chest 09/23/21  FINDINGS:  MEDICAL DEVICES: None.    AIRWAYS & LUNGS: Stable postsurgical changes of right middle lobectomy and mild upper zone predominant emphysema. The lungs are clear.    NODULES: Slight interval increased size of the subsolid left upper lobe nodule which measures roughly 11 x 8 mm (image 144 series 302), previously 9 x 7 mm. This continues to demonstrates slight increase in size and density from the June 2020 examination. No new suspicious pulmonary nodules.    PLEURA: No pleural effusions or pneumothorax.    LOWER NECK: Unremarkable.    HEART: The heart is normal in size.    VASCULAR STRUCTURES: Aorta and main pulmonary artery are normal in caliber.    MEDIASTINUM AND HILA: No pathologically enlarged lymph nodes.    CHEST WALL AND AXILLA: Unremarkable.    UPPER ABDOMEN: Multiple hepatic cysts are unchanged. Multiple bilateral renal parapelvic cysts.    OSSEOUS STRUCTURES: No suspicious osteolytic or osteoblastic lesions.    Impression   IMPRESSION:    Slight interval increase size of the left upper lobe subsolid nodule, which remains indeterminate but is concerning for an indolent primary lung malignancy. Continued follow-up is recommended.      ECHO limited with contrast 02/23/17  Impressions:  No evidence of apical thrombus on definity echo     ECHO complete with bubble study 02/22/17  Study Conclusions     - Left ventricle: The cavity size was normal. Wall thickness was     normal. Systolic function was  normal. The estimated  ejection     fraction was in the range of 60% to 65%. Wall motion was normal;     there were no regional wall motion abnormalities.There was a     small, well circumscribed echodensity at the left ventricular     apex, of uncertain etiology. There are no accompanying wall motion     abnormalities to suggest an apical thrombus. Doppler parameters     are consistent with abnormal left ventricular relaxation (grade 1     diastolic dysfunction).   - Mitral valve: Mild regurgitation.   - Right ventricle: Systolic function was normal.   - Atrial septum: No defect or patent foramen ovale was identified.   - Pulmonic valve: Peak gradient (S): 4mm Hg.     Recommendations:  This procedure has been discussed with the   referring physician, Dr. Bufford Spikes.     Anesthesia Considerations:   ASA Physical Status:  3    Duke Activity Scale:  5 - Walking four miles per hour; social dancing; washing a car.    Airway:  Mallampati I (soft palate, uvula, fauces, and tonsillar pillars visible), Thyromental distance 3 finger breadths, opening 3 finger breadths. S/p UPPP. Full neck ROM. Teeth intact.       Assessment and Recommendations:     71 yo female with lung nodule to undergo left thoracoscopic lingulectomy, possible lobectomy, possible thoracotomy under GA.    Concurrent medical issues include:    1. Cardiac risk/Functional status: No known CAD/CHF.    ECHO limited with contrast 02/23/17  Impressions:  No evidence of apical thrombus on definity echo     ECHO complete with bubble study 02/22/2017  Study Conclusions   - Left ventricle: The cavity size was normal. Wall thickness was     normal. Systolic function was normal. The estimated ejection     fraction was in the range of 60% to 65%. Wall motion was normal;     there were no regional wall motion abnormalities.There was a     small, well circumscribed echodensity at the left ventricular     apex, of uncertain etiology. There are no accompanying wall motion      abnormalities to suggest an apical thrombus. Doppler parameters     are consistent with abnormal left ventricular relaxation (grade 1     diastolic dysfunction).   - Mitral valve: Mild regurgitation.   - Right ventricle: Systolic function was normal.   - Atrial septum: No defect or patent foramen ovale was identified.   - Pulmonic valve: Peak gradient (S): 4mm Hg.     Pt reports she plays volleyball for an hour once a week and plays pickleball for an hour twice a month. Pt reports she walks her dog ~8 blocks three times a day without SOB (unless it's hot and she's wearing a mask). Pt denies SOB walking around at home, showering, dressing, or walking into OV today. Pt reports SOB vacuuming and going up 1 flight of stairs (rarely goes up stairs, at doc appts or the mall). Pt denies worsening SOB since lung surgery in 2018. Denies chest pain with activity or at rest. Denies orthopnea, however sleeps on side (as she can't fall asleep on her back, and if she does she states she doesn't go into REM sleep). Duke score of 5. Denies orthopnea, however sleeps on side (as she can't fall asleep on her back, and if she does she states she doesn't go into REM sleep).     2. HTN:  Taking lisinopril. BP today 132/84.  Instructed to hold AM of OR.    3. HLD: Taking atorvastatin. Recommend continuing perioperatively.    4. History of OSA: S/p UPPP ~10 years ago.    5. Lung cancer/lung nodule: S/p right VATS middle lobectomy in 11/2016. Pt with enlarging left upper lobe nodule. Pt scheduled for surgery with Dr. Harriette Bouillon (see above).    6. Pancreatic insufficiency: Taking Creon with meals. Recommend continuing perioperatively.    7. 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 S/p loop recorder in 03/2017, no AF, ILR removed in 03/2021. Pt denies residual deficits. Taking full dose ASA (last dose 11/08/21 in preparation for surgery), atorvastatin. Per Dr. Harriette Bouillon' OV note 10/02/21, She will decrease her aspirin to 81mg  7 days prior to surgery.  Pt instructed to start taking baby aspirin and continue baby aspirin and statin perioperatively.    8. RLS: Taking Requip in PM. Recommend continuing perioperatively.    9. Mood: Taking paroxetine. Instructed to take AM of OR.    10. Migraines: Taking Excedrin. Last migraine ~6 months ago. Instructed to hold for a week prior to OR.    11. Latex allergy: Pt reports erythema and itchiness.    12. Pain: Using lidoderm patches. Recommend continuing perioperatively.    13. Obesity: Body mass index is 31.1 kg/m.    14. 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  Patient verbalizes understanding.  Labs obtained today: T&S, CBC, BMP    Cassie Freer, CNP

## 2021-11-17 MED FILL — VANCOMYCIN 1,000 MG INTRAVENOUS INJECTION: 1000 1000 mg | INTRAVENOUS | Qty: 1750

## 2021-11-18 ENCOUNTER — Inpatient Hospital Stay
Admit: 2021-11-18 | Discharge: 2021-11-21 | Disposition: A | Discharge status: Home / Self Care | Payer: MEDICARE | Source: Ambulatory Visit

## 2021-11-18 ENCOUNTER — Inpatient Hospital Stay: Admit: 2021-11-18 | Payer: MEDICARE

## 2021-11-18 ENCOUNTER — Ambulatory Visit: Admit: 2021-11-21 | Discharge: 2021-11-21 | Payer: MEDICARE

## 2021-11-18 DIAGNOSIS — R911 Solitary pulmonary nodule: Secondary | ICD-10-CM

## 2021-11-18 DIAGNOSIS — C3412 Malignant neoplasm of upper lobe, left bronchus or lung: Secondary | ICD-10-CM

## 2021-11-18 LAB — BLOOD GAS, ARTERIAL
%HBO2, Arterial: 96.3 % (ref 95.0–98.0)
Base Excess, Arterial: -2.5 mmol/L (ref <=3.0)
CO2 Content,Arterial: 27 mmol/L (ref 23–27)
Carboxyhemoglobin, Arterial: 0.9 %
HCO3, Arterial: 25 mmol/L (ref 22–26)
Methemoglobin, Arterial: 0.6 % (ref 0.0–1.5)
Reduced hemoglobin, Arterial: 2.2 % (ref 0.0–5.0)
pCO2, Arterial: 56 mm Hg (ref 35–45)
pH, Arterial: 7.27 (ref 7.35–7.45)
pO2, Arterial: 112 mm Hg (ref 80–100)

## 2021-11-18 LAB — CBC
Hematocrit: 40.4 % (ref 35.0–45.0)
Hemoglobin: 13.4 g/dL (ref 11.7–15.5)
MCH: 30.8 pg (ref 27.0–33.0)
MCHC: 33.2 g/dL (ref 32.0–36.0)
MCV: 92.7 fL (ref 80.0–100.0)
MPV: 9.3 fL (ref 7.5–11.5)
Platelets: 157 10*3/uL (ref 140–400)
RBC: 4.36 10*6/uL (ref 3.80–5.10)
RDW: 14.1 % (ref 11.0–15.0)
WBC: 14.3 10*3/uL (ref 3.8–10.8)

## 2021-11-18 LAB — MAGNESIUM: Magnesium: 1.7 mg/dL (ref 1.5–2.5)

## 2021-11-18 LAB — SODIUM, BLOOD GAS: Sodium, Blood Gas: 142 meq/L (ref 136–146)

## 2021-11-18 LAB — EGFR GENE MUTATION
Exon 19 Deletion Mutation: NOT DETECTED
Exon 20 Insertion Mutation: NOT DETECTED
G719ACS Mutation: NOT DETECTED
L858R Mutation: NOT DETECTED
L861Q Mutation: NOT DETECTED
S768I Mutation: NOT DETECTED
T790M Mutation: NOT DETECTED

## 2021-11-18 LAB — HEMOGLOBIN, BLOOD GAS: Hgb, blood gas: 12.1 g/dL (ref 12.0–16.0)

## 2021-11-18 LAB — BASIC METABOLIC PANEL
Anion Gap: 6 mmol/L (ref 3–16)
BUN: 17 mg/dL (ref 7–25)
CO2: 23 mmol/L (ref 21–33)
Calcium: 8.6 mg/dL (ref 8.6–10.3)
Chloride: 108 mmol/L (ref 98–110)
Creatinine: 0.91 mg/dL (ref 0.60–1.30)
EGFR: 67
Glucose: 136 mg/dL — ABNORMAL HIGH (ref 70–100)
Osmolality, Calculated: 288 mOsm/kg (ref 278–305)
Potassium: 4.5 mmol/L (ref 3.5–5.3)
Sodium: 137 mmol/L (ref 133–146)

## 2021-11-18 LAB — GLUCOSE, BLOOD GAS: Glucose, Blood Gas: 94 mg/dL (ref 70–100)

## 2021-11-18 LAB — LACTIC ACID, ARTERIAL, WHOLE BLOOD,UCMC: Lactate, Art: 0.7 mmol/L (ref 0.5–1.6)

## 2021-11-18 LAB — PHOSPHORUS: Phosphorus: 4.3 mg/dL (ref 2.1–4.5)

## 2021-11-18 LAB — FREE CALCIUM, WHOLE BLOOD: Free Calcium, WB: 4.73 mg/dL (ref 4.50–5.30)

## 2021-11-18 LAB — POTASSIUM, BLOOD GAS: Potassium, Blood Gas: 3.9 mEq/L (ref 3.5–5.3)

## 2021-11-18 MED ORDER — glycopyrrolate (ROBINUL) injection
0.2 | INTRAMUSCULAR | PRN
Start: 2021-11-18 — End: 2021-11-18
  Administered 2021-11-18: 20:00:00 .6 via INTRAVENOUS

## 2021-11-18 MED ORDER — HYDROmorphone (DILAUDID) injection Syrg 0.2 mg
0.5 | INTRAMUSCULAR | PRN
Start: 2021-11-18 — End: 2021-11-18

## 2021-11-18 MED ORDER — lidocaine (PF) (XYLOCAINE) 10 mg/mL (1 %)
10 | INTRAMUSCULAR
Start: 2021-11-18 — End: 2021-11-18

## 2021-11-18 MED ORDER — dextrose 10%-water (D10W) IV soln
INTRAVENOUS | PRN
Start: 2021-11-18 — End: 2021-11-18

## 2021-11-18 MED ORDER — lactated Ringers infusion
INTRAVENOUS
Start: 2021-11-18 — End: 2021-11-19
  Administered 2021-11-18 – 2021-11-19 (×2): 75 mL/h via INTRAVENOUS

## 2021-11-18 MED ORDER — glucose chewable tablet 12 g
4 | ORAL | PRN
Start: 2021-11-18 — End: 2021-11-18

## 2021-11-18 MED ORDER — acetaminophen (TYLENOL) tablet 975 mg
325 | Freq: Four times a day (QID) | ORAL
Start: 2021-11-18 — End: 2021-11-21
  Administered 2021-11-18 – 2021-11-21 (×10): 975 mg via ORAL

## 2021-11-18 MED ORDER — HYDROmorphone (DILAUDID) injection Syrg 0.5 mg
0.5 | INTRAMUSCULAR | Status: AC | PRN
Start: 2021-11-18 — End: 2021-11-18
  Administered 2021-11-18 – 2021-11-19 (×2): 0.5 mg via INTRAVENOUS

## 2021-11-18 MED ORDER — ondansetron (ZOFRAN) injection 4 mg
4 | Freq: Three times a day (TID) | INTRAMUSCULAR | PRN
Start: 2021-11-18 — End: 2021-11-21

## 2021-11-18 MED ORDER — fentaNYL (SUBLIMAZE) 50 mcg/mL injection
50 | INTRAMUSCULAR | Status: AC
Start: 2021-11-18 — End: ?

## 2021-11-18 MED ORDER — dexamethasone (DECADRON) injection
4 | INTRAMUSCULAR | PRN
Start: 2021-11-18 — End: 2021-11-18
  Administered 2021-11-18: 17:00:00 10 via INTRAVENOUS

## 2021-11-18 MED ORDER — sodium chloride 0.9%
INTRAMUSCULAR | PRN
Start: 2021-11-18 — End: 2021-11-18
  Administered 2021-11-18: 20:00:00 20

## 2021-11-18 MED ORDER — ondansetron (ZOFRAN) injection
4 | INTRAMUSCULAR | PRN
Start: 2021-11-18 — End: 2021-11-18
  Administered 2021-11-18: 20:00:00 4 via INTRAVENOUS

## 2021-11-18 MED ORDER — ketamine (KETALAR) 10 mg/mL injection
10 | INTRAMUSCULAR | Status: AC
Start: 2021-11-18 — End: ?

## 2021-11-18 MED ORDER — phenylephrine (NEO-SYNEPHRINE) injection
10 | INTRAMUSCULAR | PRN
Start: 2021-11-18 — End: 2021-11-18
  Administered 2021-11-18: 17:00:00 100 via INTRAVENOUS
  Administered 2021-11-18 (×2): 50 via INTRAVENOUS

## 2021-11-18 MED ORDER — senna-docusate (SENNA-S) 8.6-50 mg per tablet 2 tablet
8.6-50 | Freq: Two times a day (BID) | ORAL
Start: 2021-11-18 — End: 2021-11-21
  Administered 2021-11-19 – 2021-11-21 (×4): 2 via ORAL

## 2021-11-18 MED ORDER — thromb,hm-fibrin-aprotin,s-Ca(FROZEN) (TISSEEL VHSD) 4 mL topical syringe
4 | TOPICAL
Start: 2021-11-18 — End: 2021-11-18

## 2021-11-18 MED ORDER — methocarbamoL (ROBAXIN) injection
100 | INTRAMUSCULAR | PRN
Start: 2021-11-18 — End: 2021-11-18
  Administered 2021-11-18: 20:00:00 500 via INTRAVENOUS

## 2021-11-18 MED ORDER — acetaminophen (TYLENOL) tablet 975 mg
325 | ORAL | PRN
Start: 2021-11-18 — End: 2021-11-18

## 2021-11-18 MED ORDER — magnesium hydroxide (MILK OF MAGNESIA) 400 mg/5 mL oral suspension 30 mL
400 | Freq: Four times a day (QID) | ORAL | PRN
Start: 2021-11-18 — End: 2021-11-21

## 2021-11-18 MED ORDER — traMADoL (ULTRAM) tablet 100 mg
50 | Freq: Four times a day (QID) | ORAL | PRN
Start: 2021-11-18 — End: 2021-11-21
  Administered 2021-11-18 – 2021-11-20 (×4): 100 mg via ORAL

## 2021-11-18 MED ORDER — glycopyrrolate (ROBINUL) 0.2 mg/mL injection
0.2 | INTRAMUSCULAR | Status: AC
Start: 2021-11-18 — End: ?

## 2021-11-18 MED ORDER — BUPivacaine (PF) (MARCAINE) 0.25 % (2.5 mg/mL) injection Soln
0.25 | INTRAMUSCULAR
Start: 2021-11-18 — End: 2021-11-18

## 2021-11-18 MED ORDER — traMADoL (ULTRAM) tablet 50 mg
50 | Freq: Four times a day (QID) | ORAL | PRN
Start: 2021-11-18 — End: 2021-11-21
  Administered 2021-11-20: 01:00:00 50 mg via ORAL

## 2021-11-18 MED ORDER — atorvastatin (LIPITOR) tablet 10 mg
10 | Freq: Every day | ORAL
Start: 2021-11-18 — End: 2021-11-20
  Administered 2021-11-19 – 2021-11-20 (×2): 10 mg via ORAL

## 2021-11-18 MED ORDER — HYDROmorphone (DILAUDID) injection Syrg 0.5 mg
0.5 | INTRAMUSCULAR | PRN
Start: 2021-11-18 — End: 2021-11-18

## 2021-11-18 MED ORDER — fentaNYL (SUBLIMAZE) injection 12.5 mcg
50 | INTRAMUSCULAR | PRN
Start: 2021-11-18 — End: 2021-11-18

## 2021-11-18 MED ORDER — lactated Ringers infusion
INTRAVENOUS | PRN
Start: 2021-11-18 — End: 2021-11-18
  Administered 2021-11-18: 17:00:00 via INTRAVENOUS

## 2021-11-18 MED ORDER — BUPivacaine liposome (PF) (EXPAREL) 20 mL
1.3 | Status: AC | PRN
Start: 2021-11-18 — End: 2021-11-18
  Administered 2021-11-18: 20:00:00 20 mL via PERINEURAL

## 2021-11-18 MED ORDER — vancomycin (VANCOCIN) 1,750 mg in sodium chloride 0.9 % 250 mL IVPB
INTRAVENOUS | Status: AC | PRN
Start: 2021-11-18 — End: 2021-11-18
  Administered 2021-11-18: 15:00:00 1750 mg/kg via INTRAVENOUS

## 2021-11-18 MED ORDER — PARoxetine (PAXIL) tablet 20 mg
20 | Freq: Every day | ORAL
Start: 2021-11-18 — End: 2021-11-21
  Administered 2021-11-19 – 2021-11-21 (×3): 20 mg via ORAL

## 2021-11-18 MED ORDER — sodium chloride 0.9%
INTRAMUSCULAR
Start: 2021-11-18 — End: 2021-11-18

## 2021-11-18 MED ORDER — fentaNYL (SUBLIMAZE) injection
50 | INTRAMUSCULAR | PRN
Start: 2021-11-18 — End: 2021-11-18
  Administered 2021-11-18: 19:00:00 25 via INTRAVENOUS
  Administered 2021-11-18: 21:00:00 50 via INTRAVENOUS
  Administered 2021-11-18: 19:00:00 25 via INTRAVENOUS

## 2021-11-18 MED ORDER — polyethylene glycol (MIRALAX) packet 17 g
17 | Freq: Every day | ORAL
Start: 2021-11-18 — End: 2021-11-21
  Administered 2021-11-20: 21:00:00 17 g via ORAL

## 2021-11-18 MED ORDER — fentaNYL (SUBLIMAZE) injection 25 mcg
50 | INTRAMUSCULAR | PRN
Start: 2021-11-18 — End: 2021-11-18

## 2021-11-18 MED ORDER — esmoloL (BREVIBLOC) injection
100 | INTRAVENOUS | PRN
Start: 2021-11-18 — End: 2021-11-18
  Administered 2021-11-18: 17:00:00 20 via INTRAVENOUS

## 2021-11-18 MED ORDER — rocuronium (ZEMURON) injection
10 | INTRAVENOUS | PRN
Start: 2021-11-18 — End: 2021-11-18
  Administered 2021-11-18: 19:00:00 20 via INTRAVENOUS
  Administered 2021-11-18: 18:00:00 10 via INTRAVENOUS
  Administered 2021-11-18: 17:00:00 40 via INTRAVENOUS
  Administered 2021-11-18 (×3): 10 via INTRAVENOUS

## 2021-11-18 MED ORDER — phenylephrine (NEO-SYNEPHRINE) 10 mg in sodium chloride 0.9 % 100 mL infusion
10 | INTRAVENOUS | PRN
Start: 2021-11-18 — End: 2021-11-18
  Administered 2021-11-18: 18:00:00 20 via INTRAVENOUS

## 2021-11-18 MED ORDER — bisacodyL (DULCOLAX) suppository 10 mg
10 | Freq: Once | RECTAL | Status: AC | PRN
Start: 2021-11-18 — End: 2021-11-20

## 2021-11-18 MED ORDER — oxyCODONE (ROXICODONE) immediate release tablet 5 mg
5 | ORAL | PRN
Start: 2021-11-18 — End: 2021-11-18

## 2021-11-18 MED ORDER — meperidine (PF) (DEMEROL) Soln 12.5 mg
25 | INTRAMUSCULAR | PRN
Start: 2021-11-18 — End: 2021-11-18

## 2021-11-18 MED ORDER — thromb,hm-fibrin-aprotin,s-Ca (DUPLOJECT) (TISSEEL-VHSD) topical kit
4 | TOPICAL | PRN
Start: 2021-11-18 — End: 2021-11-18
  Administered 2021-11-18: 20:00:00 4 via TOPICAL

## 2021-11-18 MED ORDER — naloxone (NARCAN) injection 0.04 mg
0.4 | INTRAMUSCULAR | PRN
Start: 2021-11-18 — End: 2021-11-18

## 2021-11-18 MED ORDER — lidocaine (PF) 20 mg/mL (2 %) Soln
20 | INTRAVENOUS | PRN
Start: 2021-11-18 — End: 2021-11-18
  Administered 2021-11-18: 17:00:00 100 via INTRAVENOUS

## 2021-11-18 MED ORDER — bupivacaine (PF)(SENSORCAINE) 0.25% injection
0.25 | INTRAMUSCULAR | PRN
Start: 2021-11-18 — End: 2021-11-18
  Administered 2021-11-18: 20:00:00 20

## 2021-11-18 MED ORDER — ondansetron (ZOFRAN) injection 4 mg
4 | Freq: Three times a day (TID) | INTRAMUSCULAR | PRN
Start: 2021-11-18 — End: 2021-11-18

## 2021-11-18 MED ORDER — gabapentin (NEURONTIN) capsule 600 mg
300 | ORAL | Status: AC | PRN
Start: 2021-11-18 — End: 2021-11-18
  Administered 2021-11-18: 15:00:00 600 mg via ORAL

## 2021-11-18 MED ORDER — acetaminophen (OFIRMEV) Soln
1000 | INTRAVENOUS | PRN
Start: 2021-11-18 — End: 2021-11-18
  Administered 2021-11-18: 21:00:00 1000 via INTRAVENOUS

## 2021-11-18 MED ORDER — peppermint oiL liquid 1 mL
PRN
Start: 2021-11-18 — End: 2021-11-18

## 2021-11-18 MED ORDER — ketamine (KETALAR) injection
10 | INTRAMUSCULAR | PRN
Start: 2021-11-18 — End: 2021-11-18
  Administered 2021-11-18: 18:00:00 20 via INTRAVENOUS

## 2021-11-18 MED ORDER — acetaminophen (OFIRMEV) 1,000 mg/100 mL (10 mg/mL) Soln
1000 | INTRAVENOUS | Status: AC
Start: 2021-11-18 — End: ?

## 2021-11-18 MED ORDER — famotidine (PF) (PEPCID) injection 20 mg
20 | Freq: Once | INTRAVENOUS | Status: AC
Start: 2021-11-18 — End: 2021-11-18
  Administered 2021-11-18: 15:00:00 20 mg via INTRAVENOUS

## 2021-11-18 MED ORDER — propofol 10 mg/ml (DIPRIVAN) injection
10 | INTRAVENOUS | PRN
Start: 2021-11-18 — End: 2021-11-18
  Administered 2021-11-18: 17:00:00 150 via INTRAVENOUS
  Administered 2021-11-18: 17:00:00 100 via INTRAVENOUS
  Administered 2021-11-18: 17:00:00 50 via INTRAVENOUS

## 2021-11-18 MED ORDER — oxyCODONE (ROXICODONE) immediate release tablet 10 mg
5 | ORAL | PRN
Start: 2021-11-18 — End: 2021-11-18

## 2021-11-18 MED ORDER — rocuronium (ZEMURON) 10 mg/mL injection
10 | INTRAVENOUS | Status: AC
Start: 2021-11-18 — End: ?

## 2021-11-18 MED ORDER — methocarbamoL (ROBAXIN) 100 mg/mL injection
100 | INTRAMUSCULAR | Status: AC
Start: 2021-11-18 — End: ?

## 2021-11-18 MED ORDER — ondansetron (ZOFRAN) 4 mg/2 mL injection
4 | INTRAMUSCULAR | Status: AC
Start: 2021-11-18 — End: ?

## 2021-11-18 MED ORDER — heparin (porcine) injection 5,000 Units
5000 | Freq: Three times a day (TID) | INTRAMUSCULAR
Start: 2021-11-18 — End: 2021-11-21
  Administered 2021-11-19 – 2021-11-21 (×9): 5000 [IU] via SUBCUTANEOUS

## 2021-11-18 MED ORDER — gabapentin (NEURONTIN) capsule 100 mg
100 | Freq: Three times a day (TID) | ORAL | Status: AC
Start: 2021-11-18 — End: 2021-11-21
  Administered 2021-11-18 – 2021-11-21 (×9): 100 mg via ORAL

## 2021-11-18 MED ORDER — heparin (porcine) injection 5,000 Units
5000 | Freq: Once | INTRAMUSCULAR | Status: AC
Start: 2021-11-18 — End: 2021-11-18
  Administered 2021-11-18: 15:00:00 5000 [IU] via SUBCUTANEOUS

## 2021-11-18 MED ORDER — lactated Ringers infusion
INTRAVENOUS
Start: 2021-11-18 — End: 2021-11-18
  Administered 2021-11-18: 15:00:00 20 mL/h via INTRAVENOUS

## 2021-11-18 MED ORDER — neostigmine methylsulfate (PROSTIGMIN) IV solution
1 | INTRAVENOUS | PRN
Start: 2021-11-18 — End: 2021-11-18
  Administered 2021-11-18: 20:00:00 4 via INTRAVENOUS

## 2021-11-18 MED ORDER — acetaminophen (TYLENOL) tablet 975 mg
325 | ORAL | Status: AC | PRN
Start: 2021-11-18 — End: 2021-11-18
  Administered 2021-11-18: 15:00:00 975 mg via ORAL

## 2021-11-18 MED ORDER — metoprolol tartrate (LOPRESSOR) injection 5 mg
5 | Freq: Four times a day (QID) | INTRAVENOUS
Start: 2021-11-18 — End: 2021-11-21
  Administered 2021-11-19 – 2021-11-21 (×6): 5 mg via INTRAVENOUS

## 2021-11-18 MED FILL — KETAMINE 10 MG/ML INJECTION SOLUTION: 10 10 mg/mL | INTRAMUSCULAR | Qty: 2

## 2021-11-18 MED FILL — XYLOCAINE-MPF 10 MG/ML (1 %) INJECTION SOLUTION: 10 10 mg/mL (1 %) | INTRAMUSCULAR | Qty: 5

## 2021-11-18 MED FILL — GABAPENTIN 100 MG CAPSULE: 100 100 MG | ORAL | Qty: 1

## 2021-11-18 MED FILL — GLYCOPYRROLATE 0.2 MG/ML INJECTION SOLUTION: 0.2 0.2 mg/mL | INTRAMUSCULAR | Qty: 2

## 2021-11-18 MED FILL — TRAMADOL 50 MG TABLET: 50 50 mg | ORAL | Qty: 2

## 2021-11-18 MED FILL — TYLENOL 325 MG TABLET: 325 325 mg | ORAL | Qty: 3

## 2021-11-18 MED FILL — SODIUM CHLORIDE 0.9 % INJECTION SOLUTION: INTRAMUSCULAR | Qty: 20

## 2021-11-18 MED FILL — EXPAREL (PF) 1.3 % (13.3 MG/ML) SUSPENSION FOR LOCAL INFILTRATION: 1.3 1.3 % (13.3 mg/mL) | Qty: 20

## 2021-11-18 MED FILL — ROCURONIUM 10 MG/ML INTRAVENOUS SOLUTION: 10 10 mg/mL | INTRAVENOUS | Qty: 5

## 2021-11-18 MED FILL — HEPARIN (PORCINE) 5,000 UNIT/ML INJECTION SOLUTION: 5000 5,000 unit/mL | INTRAMUSCULAR | Qty: 1

## 2021-11-18 MED FILL — FAMOTIDINE (PF) 20 MG/2 ML INTRAVENOUS SOLUTION: 20 20 mg/2 mL | INTRAVENOUS | Qty: 2

## 2021-11-18 MED FILL — ACETAMINOPHEN 1,000 MG/100 ML (10 MG/ML) INTRAVENOUS SOLUTION: 1000 1,000 mg/100 mL (10 mg/mL) | INTRAVENOUS | Qty: 100

## 2021-11-18 MED FILL — HYDROMORPHONE 0.5 MG/0.5 ML INJECTION SYRINGE: 0.5 0.5 mg/0.5 mL | INTRAMUSCULAR | Qty: 0.5

## 2021-11-18 MED FILL — TISSEEL VHSD (WITH APROTININ, SYNTHETIC) 4 ML TOPICAL SYRINGE: 4 4 mL | TOPICAL | Qty: 4

## 2021-11-18 MED FILL — FENTANYL (PF) 50 MCG/ML INJECTION SOLUTION: 50 50 mcg/mL | INTRAMUSCULAR | Qty: 2

## 2021-11-18 MED FILL — ROBAXIN 100 MG/ML INJECTION SOLUTION: 100 100 mg/mL | INTRAMUSCULAR | Qty: 10

## 2021-11-18 MED FILL — ONDANSETRON HCL (PF) 4 MG/2 ML INJECTION SOLUTION: 4 4 mg/2 mL | INTRAMUSCULAR | Qty: 2

## 2021-11-18 MED FILL — BUPIVACAINE (PF) 0.25 % (2.5 MG/ML) INJECTION SOLUTION: 0.25 0.25 % (2.5 mg/mL) | INTRAMUSCULAR | Qty: 30

## 2021-11-18 MED FILL — LACTATED RINGERS INTRAVENOUS SOLUTION: 20.00 20.00 mL/hr | INTRAVENOUS | Qty: 1000

## 2021-11-18 MED FILL — GABAPENTIN 300 MG CAPSULE: 300 300 MG | ORAL | Qty: 2

## 2021-11-18 MED FILL — LACTATED RINGERS INTRAVENOUS SOLUTION: 75.00 75.00 mL/hr | INTRAVENOUS | Qty: 1000

## 2021-11-18 MED FILL — METOPROLOL TARTRATE 5 MG/5 ML INTRAVENOUS SOLUTION: 5 5 mg/5 mL | INTRAVENOUS | Qty: 5

## 2021-11-18 NOTE — Unmapped (Signed)
[name]  DEPARTMENT OF ANESTHESIOLOGY  PRE-PROCEDURAL EVALUATION    Joanna Lopez is a 71 y.o. year old female presenting for:    Procedure(s):  Left Thoracoscopic Lingulectomy, possible Lobectomy, possible Thoracotomy    Surgeon:   Janett Billow, MD    Chief Complaint     Lung nodule [R91.1]    Review of Systems     Anesthesia Evaluation    Patient summary reviewed, nursing notes reviewed, CPC/PAT note reviewed and Previous anesthesia note reviewed.       No history of anesthetic complications   I have reviewed the History and Physical Exam, any relevant changes are noted in the anesthesia pre-operative evaluation.      Cardiovascular:      (+) hyperlipidemia.    Hypertension (On lis and HCTZ) is well controlled.  Valvular problems/murmurs related to MR.    (-) past MI, CAD, CHF.  ROS comment:   ECHO limited with contrast 02/23/17  Impressions:  No evidence of apical thrombus on definity echo      ECHO complete with bubble study 02/22/2017  Study Conclusions   - Left ventricle: The cavity size was normal. Wall thickness was     normal. Systolic function was normal. The estimated ejection     fraction was in the range of 60% to 65%. Wall motion was normal;     there were no regional wall motion abnormalities.There was a     small, well circumscribed echodensity at the left ventricular     apex, of uncertain etiology. There are no accompanying wall motion     abnormalities to suggest an apical thrombus. Doppler parameters     are consistent with abnormal left ventricular relaxation (grade 1     diastolic dysfunction).   - Mitral valve: Mild regurgitation.   - Right ventricle: Systolic function was normal.   - Atrial septum: No defect or patent foramen ovale was identified.   - Pulmonic valve: Peak gradient (S): 4mm Hg.       Neuro/Muscoloskeletal/Psych:    (+) neuromuscular disease (< 50% LICA stenosis, RLS), TIA, headaches (Migraines) and anxiety.    CVA.  (-) seizures.   Comments: 02/2017 and 03/2017.  S/p loop recorder in 03/2017, no AF, ILR removed in 03/2021. Pt denies residual deficits. Taking full dose ASA (last dose 11/08/21 in preparation for surgery), atorvastatin. Per Dr. Harriette Bouillon' OV note 10/02/21, She will decrease her aspirin to 81mg  7 days prior to surgery. Pt instructed to start taking baby aspirin and continue baby aspirin and statin perioperatively.    Pulmonary:        Recent URI (~7 weeks ago, completed abx) resolved.  Sleep apnea (Not on cpap, s/p UPP).    (-) COPD, asthma.   ROS comment: . Lung cancer/lung nodule: S/p right VATS middle lobectomy in 11/2016. Pt with enlarging left upper lobe nodule. Pt scheduled for surgery with Dr. Harriette Bouillon (see above).      GI/Hepatic/Renal:      GERD is well controlled.      (-) liver disease, renal disease.    Endo/Other:          (-) diabetes mellitus, hypothyroidism.        Past Medical History     Past Medical History:   Diagnosis Date    Anxiety     Basal cell cancer     Hyperlipidemia     Hypertension     Lung nodule     OSA (obstructive sleep apnea)  RLS (restless legs syndrome)        Past Surgical History     Past Surgical History:   Procedure Laterality Date    APPENDECTOMY      CATARACT EXTRACTION, BILATERAL Bilateral 07/2021    COLONOSCOPY N/A 01/23/2013    Procedure: COLONOSCOPY WITH MAC;  Surgeon: Lurene Shadow, MD;  Location: [facility] ENDOSCOPY;  Service: Gastroenterology;  Laterality: N/A;    FLEXIBLE BRONCHOSCOPY W/ UPPER ENDOSCOPY N/A 09/17/2016    Procedure: BRONCHOSCOPY SUPER D WITH FLUORO AND BIOPSY-EBUS/NAVI;  Surgeon: Johnny Bridge, MD;  Location: [facility] ENDOSCOPY;  Service: Pulmonary;  Laterality: N/A;    LOOP IMPLANT IMPLANTATION N/A 04/07/2017    Procedure: Loop  Implantation;  Surgeon: Tennis Must, MD;  Location: [facility] ELECTROPHYSIOLOGY LAB;  Service: Electrophysiology;  Laterality: N/A;    LOOP REMOVAL Left 04/09/2021    Procedure: Loop Removal;  Surgeon: Lawerance Cruel, MD;  Location: [facility] MIS SUITE;  Service: Electrophysiology;   Laterality: Left;    submastoid gland removal      THORACOSCOPY Right 11/23/2016    Procedure: RIGHT SIDED VATS MIDDLE LOBE LOBECTOMY,HILAR MEDIASTINAL LYMPH NODE DISSECTION;  Surgeon: Janett Billow, MD;  Location: [facility] OR;  Service: Thoracic;  Laterality: Right;    TONSILLECTOMY      UVULOPALATOPHARYNGOPLASTY         Family History     Family History   Adopted: Yes   Problem Relation Age of Onset    Diabetes Son        Social History     Social History     Socioeconomic History    Marital status: Widowed     Spouse name: Not on file    Number of children: Not on file    Years of education: Not on file    Highest education level: Not on file   Occupational History    Not on file   Tobacco Use    Smoking status: Never    Smokeless tobacco: Never   Vaping Use    Vaping Use: Never used   Substance and Sexual Activity    Alcohol use: Yes     Alcohol/week: 2.0 standard drinks     Types: 2 Glasses of wine per week     Comment: occasionally    Drug use: No    Sexual activity: Not on file   Other Topics Concern    Caffeine Use No     Comment: 1 can coke/day    Occupational Exposure Yes     Comment: Geographical information systems officer    Exercise No    Seat Belt Yes   Social History Narrative    Not on file     Social Determinants of Health     Financial Resource Strain: Not on file   Physical Activity: Not on file   Stress: Not on file   Social Connections: Not on file   Housing Stability: Not on file       Medications     Allergies:  Allergies   Allergen Reactions    Penicillins Anaphylaxis     Other reaction(s): Facial Swelling  Has patient had a PCN reaction causing immediate rash, facial/tongue/throat swelling, SOB or lightheadedness with hypotension: Yes  Has patient had a PCN reaction causing severe rash involving mucus membranes or skin necrosis: Yes  Has patient had a PCN reaction that required hospitalization: No  Has patient had a PCN reaction occurring within the last 10 years: No  If all of the above answers are  NO,  then may proceed with Cephalosporin use.    Adhesive      Other reaction(s): breaks out and itches if on for long time    Formaldehyde Analogues     Nsaids (Non-Steroidal Anti-Inflammatory Drug)      Severe GI issues    Promethazine Hcl      Restless legs    Latex Rash     States she has a sensitivity to latex after being around it for days, she gets a rash: she states that when she wore her cpap machine for 14 days she developed a rash on her face other than this she has no problems stating her dentist wears latex gloves and she has no problems with latex gloves.     Sulfa (Sulfonamide Antibiotics) Rash       Home Meds:  Prior to Admission medications as of 11/10/21 1119   Medication Sig Taking?   acetaminophen (TYLENOL) 325 MG tablet Take 3 tablets (975 mg total) by mouth every 6 hours as needed.    aspirin 325 MG tablet Take 1 tablet (325 mg total) by mouth daily.    aspirin-acetaminophen-caffeine (EXCEDRIN) 250-250-65 mg per tablet Take 2 tablets by mouth every 6 hours as needed for Pain.    atorvastatin (LIPITOR) 10 MG tablet Take 1 tablet (10 mg total) by mouth daily.    calcium-vitamin D (OSCAL-500 + D) 500 mg-5 mcg (200 unit) per tablet Take 1 tablet by mouth 2 times a day with meals.    lidocaine (LIDODERM) 5 % Place 1 patch onto the skin daily. Apply patch for 12 hours and then remove patch and leave off for 12 hours.    lipase-protease-amylase (CREON) 24,000-76,000 -120,000 unit CpDR Take 2 capsules po with each meal.  Patient taking differently: Take 2 capsules po with each meal.  Taking PRN Indications: exocrine pancreatic insufficiency    lisinopril (PRINIVIL,ZESTRIL) 20 MG tablet Take 0.5 tablets (10 mg total) by mouth daily. 10 mg (half tablet) daily    melatonin 3 mg Tab Take 1 tablet (3 mg total) by mouth at bedtime. PRN for sleep    multivitamin (THERAGRAN) tablet Take 1 tablet by mouth daily.    PARoxetine (PAXIL) 20 MG tablet Take 1 tablet (20 mg total) by mouth daily.    roPINIRole (REQUIP) 0.25  MG tablet Take 1 tablet (0.25 mg total) by mouth daily with dinner. Take one tablet (0.25 mg) 1-2 hours before bedtime, for three days, then take 2 tablets (0.5 mg) 1-2 hours before bedtime for four days, then take 4 tablets (1 mg) 1-2 hours before bedtime thereafter        Inpatient Meds:  Scheduled:    famotidine (PF)  20 mg Intravenous Once    heparin  5,000 Units Subcutaneous Once     Continuous:    lactated Ringers         PRN: acetaminophen, BUPivacaine liposome (PF), dextrose 10% in water **OR** dextrose 10% in water, dextrose 10% in water **OR** dextrose 10% in water, fentaNYL **OR** fentaNYL **OR** fentaNYL, gabapentin, glucose, glucose, HYDROmorphone **OR** HYDROmorphone **OR** HYDROmorphone, meperidine, naloxone, ondansetron, oxyCODONE **OR** oxyCODONE, peppermint oiL, vancomycin    Vital Signs     Wt Readings from Last 3 Encounters:   11/10/21 192 lb 11.2 oz (87.4 kg)   10/02/21 193 lb 9.6 oz (87.8 kg)   09/23/21 190 lb 12.8 oz (86.5 kg)     Ht Readings from Last 3 Encounters:   11/10/21 5' 6 (1.676 m)  10/02/21 5' 6 (1.676 m)   09/23/21 5' 6 (1.676 m)     Temp Readings from Last 3 Encounters:   11/10/21 98 F (36.7 C) (Oral)   04/09/21 98 F (36.7 C) (Oral)   02/13/21 97.1 F (36.2 C) (Tympanic)     BP Readings from Last 3 Encounters:   11/10/21 132/84   10/02/21 128/82   09/23/21 118/88     Pulse Readings from Last 3 Encounters:   11/10/21 79   10/02/21 80   09/23/21 86     @LASTSAO2 (3)@    Physical Exam     Airway:     Mallampati: II  TM distance: > = 3 FB  Neck ROM: full  (-) no facial hair, neck not short, not intubated      Dental:   - No obvious cracked, loose, chipped, or missing teeth.     Pulmonary:      Breathing: unlabored         Cardiovascular:     Rhythm: regular  Rate: normal    Neuro/Musculoskeletal/Psych:     Mental status: alert and oriented to person, place and time.          Abdominal:     Obese.    Current OB Status:       Other Findings:      Laboratory Data     Lab Results    Component Value Date    WBC 7.8 11/10/2021    HGB 13.7 11/10/2021    HCT 40.6 11/10/2021    MCV 91.4 11/10/2021    PLT 169 11/10/2021       No results found for: [facility]    Lab Results   Component Value Date    GLUCOSE 93 11/10/2021    BUN 20 11/10/2021    CO2 30 11/10/2021    CREATININE 1.12 11/10/2021    K 4.2 11/10/2021    NA 142 11/10/2021    CL 105 11/10/2021    CALCIUM 9.3 11/10/2021    ALBUMIN 3.4 (L) 02/22/2017    PROT 7.3 12/29/2016    ALKPHOS 69 12/29/2016    ALT 14 12/29/2016    AST 11 (L) 12/29/2016    BILITOT 0.8 12/29/2016       No results found for: PTT, INR    No results found for: PREGTESTUR, PREGSERUM, HCG, HCGQUANT  PFT's 11/06/21        Component 11/06/21 1035   FVC 3.41   FVC% 112   FEV1 2.67   FEV1% 114   FEV1/FVC 78   FEV1/FVC EXP 64   VC 3.30   VC% 102   RV 2.02   RV% 95   TLC 5.32   TLC% 99   DLCO 16.86   DLCO% 84   RESPONSE TO BRONCHODILATOR 2% FVC, 3% FEV1         Spirometry is normal.   There is no response to bronchodilator demonstrated.   Lung volumes are normal.   The flow-volume loop is normal.   Diffusing capacity is normal.     There have been no significant changes since test from 2020.      CT chest 09/23/21  FINDINGS:  MEDICAL DEVICES: None.    AIRWAYS & LUNGS: Stable postsurgical changes of right middle lobectomy and mild upper zone predominant emphysema. The lungs are clear.    NODULES: Slight interval increased size of the subsolid left upper lobe nodule which measures roughly 11 x 8 mm (image 144 series 302),  previously 9 x 7 mm. This continues to demonstrates slight increase in size and density from the June 2020 examination. No new suspicious pulmonary nodules.    PLEURA: No pleural effusions or pneumothorax.    LOWER NECK: Unremarkable.    HEART: The heart is normal in size.    VASCULAR STRUCTURES: Aorta and main pulmonary artery are normal in caliber.    MEDIASTINUM AND HILA: No pathologically enlarged lymph nodes.    CHEST WALL AND AXILLA: Unremarkable.    UPPER ABDOMEN:  Multiple hepatic cysts are unchanged. Multiple bilateral renal parapelvic cysts.    OSSEOUS STRUCTURES: No suspicious osteolytic or osteoblastic lesions.    Impression   IMPRESSION:    Slight interval increase size of the left upper lobe subsolid nodule, which remains indeterminate but is concerning for an indolent primary lung malignancy. Continued follow-up is recommended.      Anesthesia Plan     ASA 3         Female and current non-smoker    Anesthesia Type:  general endotracheal.      PONV Risk Factors: female, current non-smoker,  plan for postoperative opioid use.          Planned Special Techniques: Planned Special Techniques:  one lung ventilation.    Induction:   Intravenous induction.    (DLETT  ASA monitors + art line  PIV x 2  ICU post op    Discussed possible elevated risk for perioperative cva given hx, however medically optimized for procedure at this time. Patient agrees with plan)  Anesthetic plan and risks discussed with patient and family.    Plan, alternatives, and risks of anesthesia, including death, have been explained to and discussed with the patient/legal guardian.  By my assessment, the patient/legal guardian understands and agrees.  Scenario presented in detail.  Questions answered.    Use of blood products discussed with who consented to blood products.        Plan discussed with CRNA.       trial extubation

## 2021-11-18 NOTE — Unmapped (Signed)
Left Thoracoscopic Lingulectomy  Brief Op Note  Joanna Lopez  11/18/2021      Pre-op Diagnosis: Lung nodule [R91.1]       Post-op Diagnosis: Same    Procedure(s):  Left Thoracoscopic Lingulectomy      Surgeon(s):  Janett BillowSandra Starnes, MD    Anesthesia: General    Staff:   Circulator: Helen HashimotoMattis Brumbach, RN  Relief Scrub: Christophe LouisKrishna Dhakal  Scrub Person: Nadean Corwinebecca Prince; Mendel RyderSarah Hoskins, ST  Assistant: Joseph Artheyenne Oliver  RN First Assistant: Hessie Knowsarol L Fuller, RN    Estimated Blood Loss: 200 mL                 Specimens:   Specimens       ID Description Commments Type Source Tests Collected By Collected At    A LEVEL 11 LYMPH NODE LEVEL 11 LYMPH NODE Tissue Lymph Node SURGICAL PATHOLOGY EXAM   Janett BillowSandra Starnes, MD 11/18/21 1500    B LINGULECTOMY LINGULECTOMY, FROZEN SECTION Tissue Lung Left Upper SURGICAL PATHOLOGY EXAM   Janett BillowSandra Starnes, MD 11/18/21 1546    C LEVEL 5 LYMPH NODE LEVEL 5 LYMPH NODE Tissue Lymph Node SURGICAL PATHOLOGY EXAM   Janett BillowSandra Starnes, MD 11/18/21 1600                   Drains:   Chest Tube 1 Left Pleural 28 Fr. (Active)   Number of days: 0       IUC (Foley) Straight-tip;Non-latex;Temperature probe;Double-lumen 16 Fr. (Active)   Number of days: 0                There were no complications unless listed below.         Joanna Lopez     Date: 11/18/2021  Time: 4:49 PM

## 2021-11-18 NOTE — Unmapped (Signed)
Insert Arterial Line    Date/Time: 11/18/2021 11:00 AM  Performed by: Charna Archer, MD  Authorized by: Charna Archer, MD     Consent:     Consent obtained:  Verbal    Consent given by:  Patient    Risks, benefits, and alternatives were discussed: yes      Risks discussed:  Bleeding, repeat procedure, ischemia and pain  Universal protocol:     Procedure explained and questions answered to patient or proxy's satisfaction: yes      Immediately prior to procedure, a time out was called: yes      Patient identity confirmed:  Verbally with patient, arm band and hospital-assigned identification number  Indications:     Indications: hemodynamic monitoring and multiple ABGs    Pre-procedure details:     Skin preparation:  Chlorhexidine    Preparation: Patient was prepped and draped in sterile fashion    Sedation:     Sedation type:  None  Anesthesia:     Anesthesia method:  Local infiltration    Local anesthetic:  Lidocaine 1% w/o epi (4 ml)  Procedure details:     Location:  R radial    Allen's test performed: no      Needle gauge:  20 G    Placement technique:  Ultrasound guided    Number of attempts:  1    Transducer: waveform confirmed    Post-procedure details:     Post-procedure:  Secured with tape and sterile dressing applied    Procedure completion:  Tolerated

## 2021-11-18 NOTE — Unmapped (Signed)
MICU Attending Note    I independently examined and evaluated the patient. The X-rays, and labs were reviewed. The case was discussed in detail with the MICU house staff and a plan for medical care was arranged. This note may contain information copied forward from the note written by me the day before. I have reviewed and updated the history, physical exam, data, assessment, and plan of this note completely so that it reflects the evaluation and management of the patient today.     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 Patient underwent left Lingulectomy and presents to the ICU post op day 0 for close monitoring.      Allergies   Allergen Reactions    Penicillins Anaphylaxis     Other reaction(s): Facial Swelling  Has patient had a PCN reaction causing immediate rash, facial/tongue/throat swelling, SOB or lightheadedness with hypotension: Yes  Has patient had a PCN reaction causing severe rash involving mucus membranes or skin necrosis: Yes  Has patient had a PCN reaction that required hospitalization: No  Has patient had a PCN reaction occurring within the last 10 years: No  If all of the above answers are NO, then may proceed with Cephalosporin use.    Adhesive      Other reaction(s): breaks out and itches if on for long time    Formaldehyde Analogues     Nsaids (Non-Steroidal Anti-Inflammatory Drug)      Severe GI issues    Promethazine Hcl      Restless legs    Latex Rash     States she has a sensitivity to latex after being around it for days, she gets a rash: she states that when she wore her cpap machine for 14 days she developed a rash on her face other than this she has no problems stating her dentist wears latex gloves and she has no problems with latex gloves.     Sulfa (Sulfonamide Antibiotics) Rash     Vitals:    11/18/21 1040   BP: (!) 137/92   Pulse: 67    Resp: 12   Temp:    SpO2: 93%       Physical Exam:  Physical Exam  Vitals reviewed.   Eyes:      Extraocular Movements: Extraocular movements intact.   Cardiovascular:      Rate and Rhythm: Normal rate and regular rhythm.   Pulmonary:      Effort: Pulmonary effort is normal.      Comments: On NRB while recovering from anesthesia  Left sided chest tube on suction with minimal output  Musculoskeletal:      Right lower leg: No edema.      Left lower leg: No edema.   Skin:     General: Skin is warm.   Neurological:      General: No focal deficit present.      Mental Status: She is alert.        Social history and family history reviewed.   No relevant family history or social history unless stated in the HPI.    Labs and imaging were reviewed.      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 Avoid NSAIDS and ASA.  Continue home paxil    Pulmonary:  Post op day 0 - lingulectomy  Left sided chest tube to -20 cm suction. Call MD if output . 150 cc/hour  Avoid NSAIDs, or ASA    CV;  Hemodynamically stable  Holding home lisinopril  Continue home atorvastatin    GI;  Able to have diet when awake  Bowel regimen    Renal;  Monitor UOP and renal function    ID:  No acute infection at this time    Heme/onc;  Prior history of carcinoid  LUL nodule - pending official path report - concern for adenocarcinoma    Code: Full  DVT ppx: SQH  Lines: aline, PIV, left chest tube  Nutrition: diet  Disposition: ICU      I spent a total of 40 minutes of critical care time caring for this patient with residents/fellows, including direct patient contact, management of life supporting systems, review of data such as imaging and labs, and care oriented discussions with team members. This time excludes time spend performing  procedures.

## 2021-11-18 NOTE — Unmapped (Signed)
INTRA-OP POST BRIEFING NOTE: Joanna Lopez      Specimens:   Specimens       ID Source Type Tests Collected By Collected At Frozen? Attributes Order ID Breast Spec Formalin Marked as Sent    A Lymph Node Tissue SURGICAL PATHOLOGY EXAM   Janett Billow, MD 11/18/21 1500   [id number]        Comment: LEVEL 11 LYMPH NODE    B Lung Left Upper Tissue SURGICAL PATHOLOGY EXAM   Janett Billow, MD 11/18/21 1546 Yes  [id number]        Comment: LINGULECTOMY, FROZEN SECTION    C Lymph Node Tissue SURGICAL PATHOLOGY EXAM   Janett Billow, MD 11/18/21 1600   [id number]        Comment: LEVEL 5 LYMPH NODE            Prior to leaving the room: Nurse confirmed name of procedure, completion of instrument, sponge & needle counts, reads specimen labels aloud including patient name and addresses any equipment issues? Nurse confirmed wound class. Nurse to surgeon and anesthesia: What are key concerns for recovery and management of the patient?  Yes      Blood products stored at appropriate temperatures prior to return to blood bank (if applicable)? N/A      Patient identification band secured on patient prior to transfer out of the operating room? Yes    Temporary devices implanted for the duration of the surgery removed and evaluated for intactness and completeness prior to closure? N/A      Other Comments:     Signed: Aaliah Jorgenson    Date: 11/18/2021    Time: 4:19 PM

## 2021-11-18 NOTE — Unmapped (Signed)
Anesthesia Transfer of Care Note    Patient: Joanna Lopez  Procedure(s) Performed: Procedure(s):  Left Thoracoscopic Lingulectomy    Patient location: ICU    Anesthesia type: general endotracheal    Airway Device on Arrival to PACU/ICU: Venti-mask    IV Access: Peripheral    Monitors Recommended to be Used During PACU/ICU: Arterial Line and Quarry manager Issues to Address: None    Level of Consciousness: awake and alert     Post vital signs:    Vitals:    11/18/21 1040   BP: (!) 137/92   Pulse: 67   Resp: 12   Temp:    SpO2: 93%       Complications:  No notable events documented.    Date 11/17/21 1500 - 11/18/21 0659(Not Admitted) 11/18/21 0700 - 11/19/21 0659   Shift 1500-2259 2300-0659 24 Hour Total 0700-1459 1500-2259 2300-0659 24 Hour Total   INTAKE   I.V.    1000(11.9) 500(6)  1500(17.9)     Volume (mL) (lactated Ringers infusion)     500  500     Volume (mL) (lactated Ringers infusion)    1000   1000   IV Piggyback     100  100     Volume (mL) (acetaminophen (OFIRMEV) Soln)     100  100   Shift Total(mL/kg)    1000(11.9) 600(7.2)  1600(19.1)   OUTPUT   Urine    35(0.1) 190  225     Urine    35 190  225   Blood     200  200     Est Blood Loss     200  200   Shift Total(mL/kg)    35(0.4) 390(4.6)  425(5.1)   Weight (kg)    83.9 83.9 83.9 83.9

## 2021-11-18 NOTE — Unmapped (Signed)
[place]                        [facility]      PATIENT NAME:      Joanna Lopez, Joanna Lopez        MRN:               [id number]  DATE OF BIRTH:     03/24/1951                    CSN:               [id number]  PHYSICIAN:         Janett Billow, MD            ADMIT DATE:        11/18/2021  DICTATED BY:       Janett Billow, MD            SURGERY DATE:      11/18/2021                              OPERATIVE REPORT    SURGEON:  Janett Billow, MD      PREOPERATIVE DIAGNOSIS:  Left upper lobe lung nodule.    POSTOPERATIVE DIAGNOSIS:  Left upper lobe lung nodule.    PROCEDURES PERFORMED:    1. Left thoracoscopic lingular segmentectomy.  2. Hilar and mediastinal lymphadenectomy.    RESIDENT SURGEON:  Marcelyn Ditty.    ANESTHESIA:  General.    INDICATIONS:  Ms. 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  She has been followed with serial imaging.  She has had a slowly enlarging left upper lobe lung nodule,   currently measuring 11 mm.  Given the concern for lung cancer, we discussed the risks and benefits of proceeding with a left VATS lingulectomy versus left upper lobectomy as this was not in a position for wedge resection.  Her pulmonary function tests   were excellent and acceptable for pulmonary resection.  Consent was obtained.    DESCRIPTION OF PROCEDURE:  Ms. Macknight was brought back to the operating room where general anesthesia was induced.  Appropriate monitoring lines were placed.  She was intubated with a double-lumen endotracheal tube and position was confirmed with   flexible bronchoscope.  She was positioned in the right lateral decubitus position.  Her left chest prepped and draped in usual sterile fashion.  A time-out was called to confirm correct patient, correct procedure, and correct position.  Preoperative   antibiotics and subcutaneous heparin were given.  Her left lung was  isolated by Anesthesia.  A 12 mm trocar was placed in the posterior auscultatory triangle in the 6th intercostal space.  A 2nd trocar was placed in the midaxillary line in the 6th   intercostal space.  A 3rd trocar was placed in posterior axillary line in the 8th intercostal space.  All trocar sites were infiltrated with liposomal bupivacaine prior to placement.  An access port was then made in the 4th intercostal space by making a   6 cm incision anterior to the latissimus dorsi muscle.  This was carried down through subcutaneous tissue with electrocautery.  The serratus muscle fibers were divided with electrocautery.  The interspace was entered and a soft tissue retractor was   placed.  At no time were the  ribs spread.     We 1st took down the inferior pulmonary ligament.  There were no level 9 lymph nodes identified.  We then dissected the anterior hilum, exposing the superior and inferior pulmonary veins.  We then turned our attention to the fissure.  The fissure was   incomplete and the pulmonary artery in the fissure was a bit apparatus, making the dissection difficult.  However, we ultimately identified the pulmonary artery in the fissure.  The anterior aspect of the major fissure was then completed with an Endo-GIA   green load stapler.  The level 11 lymph nodes were dissected off the pulmonary artery and bronchus and sent for permanent pathology.  We identified the lingular pulmonary artery branch and this was circumferentially dissected and divided with an   endovascular stapler.  We next identified the lingular bronchus.  This was circumferentially dissected and divided with an Endo-GIA green load stapler.  We confirmed aeration in the remaining left upper lobe and lower lobe prior to dividing the bronchus.    We then were dissecting out some tissue behind the bronchus and we did enter a branch of the vein which appeared to be going to the lingular segment.  This was controlled with several clips.  We  then completed the segmental fissure with several firings   of an Endo-GIA purple load stapler.  Specimen was brought out through the access port.  The nodule was completely encompassed within the specimen with a gross at least 1 cm margin.  On frozen section, this was consistent with adenocarcinoma.     We then opened the AP window space and the AP window lymph node packet was resected en bloc and sent for permanent pathology.  There were no level 10 lymph nodes identified.  Hemostasis was ensured.  The chest was irrigated copiously with warm water.    Tisseel fibrin sealant was placed on the staple lines in the fissural dissection.  We then did intercostal blocks, injecting 5 mL of liposomal bupivacaine between each of the 4th through 9th interspaces.  The remaining liposomal bupivacaine was injected   in the subcutaneous tissue of the access port and the port sites.  A total of 20 mL diluted with 20 mL of 0.25% Marcaine and 20 mL of saline was used.  A single 28-French chest tube was placed anteriorly and apically.  The lung was reinflated and all   trocars and soft tissue retractor removed.  The incisions were closed in layers.  The serratus fascia was reapproximated with a running 0 Vicryl.  The Scarpa's fascia was closed with a running 2-0 Vicryl.  The subcutaneous tissue was closed with a   running 3-0 Vicryl.  The skin was closed with a running 4 Monocryl subcuticular stitch.  Dermabond was applied.  The other port sites were closed in a likewise layered fashion.  She tolerated the procedure well.  At the end of the procedure, all lap,   needle, instrument counts were correct.  She was extubated and brought to recovery room in stable condition.        Janett Billow, MD      SS/AQ  DD: 11/18/2021 16:43:52  DT: 11/18/2021 18:05:31    JOB#:  [id number]

## 2021-11-18 NOTE — Unmapped (Signed)
Pt admitted to ICU. Chest tube in place, foley and art line in place. Pt stating 8/10 pain, pain meds given. Family at bedside. Will continue to monitor.

## 2021-11-19 ENCOUNTER — Inpatient Hospital Stay: Admit: 2021-11-19 | Payer: MEDICARE

## 2021-11-19 LAB — CBC
Hematocrit: 35.5 % (ref 35.0–45.0)
Hemoglobin: 11.8 g/dL (ref 11.7–15.5)
MCH: 30.7 pg (ref 27.0–33.0)
MCHC: 33.3 g/dL (ref 32.0–36.0)
MCV: 92.2 fL (ref 80.0–100.0)
MPV: 9.8 fL (ref 7.5–11.5)
Platelets: 160 10*3/uL (ref 140–400)
RBC: 3.84 10*6/uL (ref 3.80–5.10)
RDW: 14.1 % (ref 11.0–15.0)
WBC: 14.9 10*3/uL (ref 3.8–10.8)

## 2021-11-19 LAB — BASIC METABOLIC PANEL
Anion Gap: 6 mmol/L (ref 3–16)
BUN: 17 mg/dL (ref 7–25)
CO2: 27 mmol/L (ref 21–33)
Calcium: 8.5 mg/dL (ref 8.6–10.3)
Chloride: 105 mmol/L (ref 98–110)
Creatinine: 0.96 mg/dL (ref 0.60–1.30)
EGFR: 63
Glucose: 133 mg/dL (ref 70–100)
Osmolality, Calculated: 289 mOsm/kg (ref 278–305)
Potassium: 4.3 mmol/L (ref 3.5–5.3)
Sodium: 138 mmol/L (ref 133–146)

## 2021-11-19 LAB — POC GLU MONITORING DEVICE: POC Glucose Monitoring Device: 124 mg/dL — ABNORMAL HIGH (ref 70–100)

## 2021-11-19 LAB — PHOSPHORUS: Phosphorus: 3.9 mg/dL (ref 2.1–4.5)

## 2021-11-19 LAB — MAGNESIUM: Magnesium: 1.7 mg/dL (ref 1.5–2.5)

## 2021-11-19 MED ORDER — magnesium sulfate in sterile water 50 mL IVPB 2 g
2 | Freq: Once | INTRAVENOUS | Status: AC
Start: 2021-11-19 — End: 2021-11-19
  Administered 2021-11-19: 21:00:00 2 g via INTRAVENOUS

## 2021-11-19 MED ORDER — HYDROmorphone (DILAUDID) injection Syrg 0.5 mg
0.5 | INTRAMUSCULAR | PRN
Start: 2021-11-19 — End: 2021-11-21

## 2021-11-19 MED ORDER — HYDROmorphone (DILAUDID) injection Syrg 1 mg
1 | INTRAMUSCULAR | PRN
Start: 2021-11-19 — End: 2021-11-21
  Administered 2021-11-19 – 2021-11-20 (×5): 1 mg via INTRAVENOUS

## 2021-11-19 MED ORDER — ICU ELECTROLYTE REPLACEMENT PROTOCOL
PRN
Start: 2021-11-19 — End: 2021-11-21

## 2021-11-19 MED ORDER — magnesium sulfate in sterile water 100 mL IVPB 4 g
4 | Freq: Once | INTRAVENOUS | Status: AC
Start: 2021-11-19 — End: 2021-11-19
  Administered 2021-11-19: 17:00:00 4 g via INTRAVENOUS

## 2021-11-19 MED FILL — METOPROLOL TARTRATE 5 MG/5 ML INTRAVENOUS SOLUTION: 5 5 mg/5 mL | INTRAVENOUS | Qty: 5

## 2021-11-19 MED FILL — TYLENOL 325 MG TABLET: 325 325 mg | ORAL | Qty: 3

## 2021-11-19 MED FILL — HYDROMORPHONE 1 MG/ML INJECTION SYRINGE: 1 1 mg/mL | INTRAMUSCULAR | Qty: 1

## 2021-11-19 MED FILL — STIMULANT LAXATIVE PLUS 8.6 MG-50 MG TABLET: 8.6-50 8.6-50 mg | ORAL | Qty: 2

## 2021-11-19 MED FILL — GABAPENTIN 100 MG CAPSULE: 100 100 MG | ORAL | Qty: 1

## 2021-11-19 MED FILL — PAROXETINE 20 MG TABLET: 20 20 MG | ORAL | Qty: 1

## 2021-11-19 MED FILL — LACTATED RINGERS INTRAVENOUS SOLUTION: 75.00 75.00 mL/hr | INTRAVENOUS | Qty: 1000

## 2021-11-19 MED FILL — HEPARIN (PORCINE) 5,000 UNIT/ML INJECTION SOLUTION: 5000 5,000 unit/mL | INTRAMUSCULAR | Qty: 1

## 2021-11-19 MED FILL — HYDROMORPHONE 0.5 MG/0.5 ML INJECTION SYRINGE: 0.5 0.5 mg/0.5 mL | INTRAMUSCULAR | Qty: 0.5

## 2021-11-19 MED FILL — ICU ELECTROLYTE REPLACEMENT PROTOCOL: Qty: 1

## 2021-11-19 MED FILL — ATORVASTATIN 10 MG TABLET: 10 10 MG | ORAL | Qty: 1

## 2021-11-19 MED FILL — MAGNESIUM SULFATE 2 GRAM/50 ML (4 %) IN WATER INTRAVENOUS PIGGYBACK: 2 2 gram/50 mL (4 %) | INTRAVENOUS | Qty: 50

## 2021-11-19 MED FILL — MAGNESIUM SULFATE 4 GRAM/100 ML (4 %) IN WATER INTRAVENOUS PIGGYBACK: 4 4 gram/100 mL (4 %) | INTRAVENOUS | Qty: 100

## 2021-11-19 MED FILL — TRAMADOL 50 MG TABLET: 50 50 mg | ORAL | Qty: 2

## 2021-11-19 NOTE — Unmapped (Signed)
Nursing Overnight Progress Note    Significant Events During Shift  Patient resting quietly in bed through out HS.  This am when attempt to read menu patient noted to have double vision in her left eye.  NIHSS and neuro exam completed with no other deficits.  Patient reports that last time that she had this procedure she had a TIA, 5 years prior.  She takes that the double vision, taste and smell were the symptoms that she had last time.  Code stroke called, MD in to assess.  New order received for Head CT.  Completed with no adverse effects.    Patient/Family Concerns  Evening/Overnight Visitors: none  Concerns:  Patient eager about results of CT scan.    Assessment  Nursing time demands: moderate  IV access: has IV access, adequate and functioning  Sitter requirements:  no    Mental Status  Mental Status for the past 14 hrs:   Level of Consciousness Orientation Level Cognition   11/18/21 1800 Drowsy: falls to sleep frequently at inappropriate times/place Oriented X4 Follows simple commands   11/19/21 0000 Drowsy: falls to sleep frequently at inappropriate times/place Oriented X4 Follows simple commands   11/19/21 0657 Alert Oriented X4 Follows 2-step commands       Calls to Physician Team  No data found.    Medications  [id number] - Medications Not Given  (last 12 hrs)           ** SITE UNKNOWN **         Medication Name Action Time Action Reason Comments     acetaminophen (TYLENOL) tablet 975 mg 11/19/21 0708 Hold Contraindicated      senna-docusate (SENNA-S) 8.6-50 mg per tablet 2 tablet 11/18/21 2059 Hold Patient/family refused wants to take in AM                    Diet/Meals Consumed (past 24 hours)  Nutrition Assessment for the past 24 hrs:   Diet Type Feeding Percent Meals Eaten (%) Fluid Restrictions   11/18/21 1900 -- -- 100 % --   11/19/21 0000 Regular Able to feed self -- 0   11/19/21 0100 -- -- -- 0   11/19/21 0200 -- -- -- 0   11/19/21 0300 -- -- -- 0   11/19/21 0400 Regular Able to feed self -- 0    11/19/21 0500 Regular Able to feed self -- 0

## 2021-11-19 NOTE — Unmapped (Addendum)
7:15 AM  Patient woke up this AM with monocular blurred vision in her left eye. Had similar symptoms x 2 when she had her previous VATS. MRI at that time was negative for CVA but did show possible 6th nerve compression. Follow-up as outpatient felt symptoms may be related to small vessel stroke.   LKW was sometime yesterday during the day.   NIHSS of 0 and no other deficits on exam.   CT Head ordered, neurology consult placed.

## 2021-11-19 NOTE — Unmapped (Signed)
Surgery Progress Note    Patient: Joanna Lopez  Admit Date: 11/18/2021  OR Date: 11/18/2021    SUBJECTIVE   L eye diplopia in the morning   CT head obtained   Neurology consulted  Afebrile, HDS, on RA    OBJECTIVE   Vitals:  Temp:  [97.5 F (36.4 C)-98.3 F (36.8 C)] 98.3 F (36.8 C)  Heart Rate:  [53-84] 76  Resp:  [10-29] 16  BP: (108-142)/(62-95) 117/62  FiO2:  [56 %-100 %] 100 %    Date 11/18/21 0700 - 11/19/21 0659 11/19/21 0700 - 11/20/21 0659   Shift 0700-1459 1500-2259 2300-0659 24 Hour Total 0700-1459 1500-2259 2300-0659 24 Hour Total   INTAKE   P.O.  240 200 440         P.O.  240 200 440       I.V.(mL/kg) 1000(11.9) 500(6) 850(9.8) 1610(96.0)         I.V.   0 0         Volume (mL) (lactated Ringers infusion)  500  500         Volume (mL) (lactated Ringers infusion)   850 850         Volume (mL) (lactated Ringers infusion) 1000   1000       Other   0 0         Other   0 0       IV Piggyback  100 5 105         Volume (mL) (acetaminophen (OFIRMEV) Soln)  100  100         Volume (mL) (metoprolol tartrate (LOPRESSOR) injection 5 mg)   5 5       Saline Fluid   0 0         Myosure Intake   0 0       Shift Total(mL/kg) 1000(11.9) 840(10) 4540(98.1) 1914(78.2)       OUTPUT   Urine(mL/kg/hr) 35(0.1) 580(0.9) 675(1) 1290(0.6) 365   365     Urine 35 190 0 225         Urine Occurrence   0 x 0 x         Output (mL) (IUC (Foley) Straight-tip;Non-latex;Temperature probe;Double-lumen 16 Fr.)  [phone] 365   365   Emesis/NG output   0 0         Emesis   0 0         Emesis Occurrence   0 x 0 x       Stool   0 0         Stool Occurrence   0 x 0 x         Stool   0 0       Blood  200 0 200         Est Blood Loss  200 0 200         Blood   0 0       Chest Tube  35 60 95 45   45     Output (mL) (Chest Tube 1 Left Pleural 28 Fr.)  35 60 95 45   45   Intrauterine   0 0         Myosure Output   0 0       Shift Total(mL/kg) 35(0.4) 815(9.7) 735(8.5) 1585(18.3) 410(4.7)   410(4.7)   Weight (kg) 83.9 83.9 86.4 86.4 86.4  86.4 86.4 86.4  Physical Exam:  Gen: NAD, A+Ox3  CV: RRR,   Resp: CTAB, no respiratory distress  Abd: Soft, NT/ND  Ext: Warm and well perfused, SCD  GU: Foley in draining clear yellow urine  Wound: C/D/I, Chest tube with 95cc of SS output    Labs:  Recent Labs     11/18/21  1321 11/18/21  1734 11/19/21  0644   WBC  --  14.3* 14.9*   HGB 12.1 13.4 11.8   HCT  --  40.4 35.5   PLT  --  157 160     Recent Labs     11/18/21  1734 11/19/21  0644   NA 137 138   K 4.5 4.3   CL 108 105   CO2 23 27   BUN 17 17   CREATININE 0.91 0.96   GLUCOSE 136* 133*   CALCIUM 8.6 8.5*   MG 1.7 1.7   PHOS 4.3 3.9     Recent Labs     11/19/21  0655   POCGMD 124*     No results for input(s): AST, ALT, BILITOT, BILIDIRECT, ALKPHOS, ALBUMIN, GGT, AMYLASE, LIPASE in the last 72 hours.  No results for input(s): INR, PROTIME in the last 72 hours.    Current Medications:  Scheduled Meds:   acetaminophen  975 mg Oral Q6H    atorvastatin  10 mg Oral Daily 0900    gabapentin  100 mg Oral Q8H    heparin  5,000 Units Subcutaneous 3 times per day    magnesium sulfate  4 g Intravenous Once    And    magnesium sulfate in sterile water 50 mL  2 g Intravenous Once    metoprolol tartrate  5 mg Intravenous Q6H SCH    PARoxetine  20 mg Oral Daily 0900    [START ON 11/20/2021] polyethylene glycol  17 g Oral DAILY 0700    senna-docusate  2 tablet Oral BID     Continuous Infusions:  PRN Meds: [START ON 11/20/2021] bisacodyL, HYDROmorphone **OR** HYDROmorphone, ICU electrolyte replacement protocol **AND** Initiate electrolyte replacement protocol, magnesium hydroxide, ondansetron, traMADol **OR** traMADol    ASSESSMENT/PLAN   Joanna Lopez is a 71 y.o. 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    Today's plan:  -- Water seal Chest tube  -- Dc foley  -- Hep lock IVFs  -- CT head (stroke protocol), negative  for hemorrhage or stroke  -- F/w neurology recs      Pain: Multimodal pain regimen   CV: HDS  PULM: Chest tube to water seal  Diet: Regular   GU/RENAL: Dc foley  HEME: no issues  ID: no issues  ENDOCRINE: no issues  PPX: SQH, SCD,  DISPO: SICU    Final plan per attending physician.      Prithvi Kooi  11/19/2021

## 2021-11-19 NOTE — Unmapped (Signed)
[facility] Consult Note      Date of visit: November 19, 2021  Patient Name: Joanna Lopez  MRN:  [id number]  DOB:  23-Jan-1951   Chief Complaint: double vision     History of Present Illness   Joanna Lopez is a 71 y.o. female with HTN, OSA, RLS, lung carcinoid tumor s/p middle lobectomy, who presented for surgical intervention of pulmonary nodule. She underwent Left thoracoscopic lingular segmentectomy, hilar and mediastinal lymphadenectomy. After regaining consciousness in the post-operative period, she felt double vision. She describes objects on top of each other (not side-by-side) when both eyes are open, and when looking to the left. The double vision resolves with either closed, and with tilting head to the left.  In Oct 2018, there was severe double vision following right middle lobectomy. There was also altered taste. Imaging indicated vascular compression of the right CN6 which is asymptomatic, CSF was normal and cardiac montior was unremarkable. MRI brain indicated subtle diffusion restriction in the periaqueductal gray matter, therefore, she was diagnosed with a small-vessel stroke. After 5 months, this improved.       Review of Systems   Review of Systems   Eyes:  Positive for double vision.   Patient History     Past Medical History:   Diagnosis Date    Anxiety     Basal cell cancer     Hyperlipidemia     Hypertension     Lung nodule     OSA (obstructive sleep apnea)     RLS (restless legs syndrome)       Past Surgical History:   Procedure Laterality Date    APPENDECTOMY      CATARACT EXTRACTION, BILATERAL Bilateral 07/2021    COLONOSCOPY N/A 01/23/2013    Procedure: COLONOSCOPY WITH MAC;  Surgeon: Lurene Shadow, MD;  Location: [facility] ENDOSCOPY;  Service: Gastroenterology;  Laterality: N/A;    FLEXIBLE BRONCHOSCOPY W/ UPPER ENDOSCOPY N/A 09/17/2016    Procedure: BRONCHOSCOPY SUPER D WITH FLUORO AND BIOPSY-EBUS/NAVI;  Surgeon: Johnny Bridge, MD;  Location: [facility] ENDOSCOPY;  Service:  Pulmonary;  Laterality: N/A;    LOOP IMPLANT IMPLANTATION N/A 04/07/2017    Procedure: Loop  Implantation;  Surgeon: Tennis Must, MD;  Location: [facility] ELECTROPHYSIOLOGY LAB;  Service: Electrophysiology;  Laterality: N/A;    LOOP REMOVAL Left 04/09/2021    Procedure: Loop Removal;  Surgeon: Lawerance Cruel, MD;  Location: [facility] MIS SUITE;  Service: Electrophysiology;  Laterality: Left;    submastoid gland removal      THORACOSCOPY Right 11/23/2016    Procedure: RIGHT SIDED VATS MIDDLE LOBE LOBECTOMY,HILAR MEDIASTINAL LYMPH NODE DISSECTION;  Surgeon: Janett Billow, MD;  Location: [facility] OR;  Service: Thoracic;  Laterality: Right;    TONSILLECTOMY      UVULOPALATOPHARYNGOPLASTY        Family History   Adopted: Yes   Problem Relation Age of Onset    Diabetes Son       Social History     Socioeconomic History    Marital status: Widowed     Spouse name: None    Number of children: None    Years of education: None    Highest education level: None   Occupational History    None   Tobacco Use    Smoking status: Never    Smokeless tobacco: Never   Vaping Use    Vaping Use: Never used   Substance and Sexual Activity    Alcohol use: Yes     Alcohol/week: 2.0  standard drinks     Types: 2 Glasses of wine per week     Comment: occasionally    Drug use: No    Sexual activity: None   Other Topics Concern    Caffeine Use No     Comment: 1 can coke/day    Occupational Exposure Yes     Comment: Geographical information systems officer    Exercise No    Seat Belt Yes   Social History Narrative    None     Social Determinants of Health     Financial Resource Strain: Not on file   Physical Activity: Not on file   Stress: Not on file   Social Connections: Not on file   Housing Stability: Not on file     Medications and Allergies        Medication List        ASK your doctor about these medications        Quantity/Refills   acetaminophen 325 MG tablet  Commonly known as: TYLENOL  Take 3 tablets (975 mg total) by mouth every 6 hours as needed.   Quantity: 60  tablet  Refills: 0     * aspirin 81 MG chewable tablet  Chew 1 tablet (81 mg total) by mouth daily.   Refills: 0     * aspirin 325 MG tablet  Take 1 tablet (325 mg total) by mouth daily.   Quantity: 30 tablet  Refills: 0     aspirin-acetaminophen-caffeine 250-250-65 mg per tablet  Commonly known as: EXCEDRIN  Take 2 tablets by mouth every 6 hours as needed for Pain.   Refills: 0     atorvastatin 10 MG tablet  Commonly known as: LIPITOR  Take 1 tablet (10 mg total) by mouth daily.   Refills: 0     calcium-vitamin D 500 mg-5 mcg (200 unit) per tablet  Commonly known as: OSCAL-500 + D  Take 1 tablet by mouth 2 times a day with meals.   Refills: 0     lidocaine 5 %  Commonly known as: Lidoderm  Place 1 patch onto the skin daily. Apply patch for 12 hours and then remove patch and leave off for 12 hours.   Quantity: 30 patch  Refills: 0     lipase-protease-amylase 24,000-76,000 -120,000 unit Cpdr  Commonly known as: Creon  Take 2 capsules po with each meal.   Quantity: 180 capsule  For: exocrine pancreatic insufficiency  Refills: 6     lisinopriL 20 MG tablet  Commonly known as: PRINIVIL  Take 0.5 tablets (10 mg total) by mouth daily. 10 mg (half tablet) daily   Refills: 0     melatonin 3 mg Tab  Take 1 tablet (3 mg total) by mouth at bedtime. PRN for sleep   Refills: 0     multivitamin tablet  Commonly known as: THERAGRAN  Take 1 tablet by mouth daily.   Refills: 0     PARoxetine 20 MG tablet  Commonly known as: PAXIL  Take 1 tablet (20 mg total) by mouth daily.   Refills: 0     roPINIRole 0.25 MG tablet  Commonly known as: REQUIP  Take 1 tablet (0.25 mg total) by mouth daily with dinner. Take one tablet (0.25 mg) 1-2 hours before bedtime, for three days, then take 2 tablets (0.5 mg) 1-2 hours before bedtime for four days, then take 4 tablets (1 mg) 1-2 hours before bedtime thereafter   Quantity: 95 tablet  For: Restless Legs  Syndrome  Refills: 5           * This list has 2 medication(s) that are the same as other  medications prescribed for you. Read the directions carefully, and ask your doctor or other care provider to review them with you.                Scheduled Meds:   acetaminophen  975 mg Oral Q6H    atorvastatin  10 mg Oral Daily 0900    gabapentin  100 mg Oral Q8H    heparin  5,000 Units Subcutaneous 3 times per day    metoprolol tartrate  5 mg Intravenous Q6H SCH    PARoxetine  20 mg Oral Daily 0900    [START ON 11/20/2021] polyethylene glycol  17 g Oral DAILY 0700    senna-docusate  2 tablet Oral BID     Continuous Infusions:  PRN Meds:.[START ON 11/20/2021] bisacodyL, magnesium hydroxide, ondansetron, traMADol **OR** traMADol  Allergies   Allergen Reactions    Penicillins Anaphylaxis     Other reaction(s): Facial Swelling  Has patient had a PCN reaction causing immediate rash, facial/tongue/throat swelling, SOB or lightheadedness with hypotension: Yes  Has patient had a PCN reaction causing severe rash involving mucus membranes or skin necrosis: Yes  Has patient had a PCN reaction that required hospitalization: No  Has patient had a PCN reaction occurring within the last 10 years: No  If all of the above answers are NO, then may proceed with Cephalosporin use.    Adhesive      Other reaction(s): breaks out and itches if on for long time    Formaldehyde Analogues     Nsaids (Non-Steroidal Anti-Inflammatory Drug)      Severe GI issues    Promethazine Hcl      Restless legs    Latex Rash     States she has a sensitivity to latex after being around it for days, she gets a rash: she states that when she wore her cpap machine for 14 days she developed a rash on her face other than this she has no problems stating her dentist wears latex gloves and she has no problems with latex gloves.     Sulfa (Sulfonamide Antibiotics) Rash     Vitals     Vitals:    11/19/21 0715 11/19/21 0730 11/19/21 0740 11/19/21 0745   BP:    124/74   BP Location:    Left arm   Patient Position:    Lying   Pulse: 57 61  56   Resp: 16 16  16     Temp:   98.3 F (36.8 C)    TempSrc:   Oral    SpO2: 100% 99%  94%   Weight:       Height:         Temp:  [97.5 F (36.4 C)-98.4 F (36.9 C)] 98.3 F (36.8 C)  Heart Rate:  [54-84] 56  Resp:  [10-29] 16  BP: (108-147)/(71-95) 124/74  FiO2:  [56 %-100 %] 100 %  Wt Readings from Last 3 Encounters:   11/19/21 190 lb 7.6 oz (86.4 kg)   11/10/21 192 lb 11.2 oz (87.4 kg)   10/02/21 193 lb 9.6 oz (87.8 kg)       Physical Exam   General: Well groomed, well nourished, resting comfortably in no acute distress.    Respiratory: Respirations are even and unlabored on room air, no use of accessory muscles.  Cardiovascular: Regular  rate and rhythm. No evidence of peripheral edema.  No carotid bruit.  Gastrointestinal: Abdomen soft, no pain on palpation.  Skin: No rash.    Neurologic Exam   Appearance:   Well groomed, well nourished patient in no acute distress.  Language/Speech:   Fluent language.  No word finding difficulty or paraphasic errors.  Able to name simple objects.  Able to repeat.  Able to follow 2 step commands.  No neglect or gaze preference.  Attention/concentration:   Awake and able to attend to the examiner.  Able to provide a history with memory intact to recent events.  Orientation:   Oriented to self, year, month, day, date, situation.   Fund of Knowledge:   Able to name current events/holidays.    Cranial nerves:      CN II Pupils are equal, round and reactive.  No afferent pupillary defect.    Visual fields intact to finger counting.    CN III,IV,VI Eyes are aligned in primary gaze.  No skew.  Extraocular eye movements intact, except OS does not elevate when abducted. No nystagmus.    CN V Sensation is intact V1-V3 bilaterally to light touch.    CN VII Facial movements are symmetric at rest and with activation.  No dysarthria.    CN VIII Hearing is intact to conversational tone.    CN IX & X Soft palate elevates symmetrically.    CN XI Shoulder shrug strength 5/5 bilaterally .    CN XII Tongue protrudes  midline.     Motor:   Normal tone and bulk  No tremors and myoclonus    Movement Right Left Comments   Deltoid 5 5    Biceps 5 5    Triceps 5 5    Wrist Flexion 5 5    Wrist Extension 5 5    Finger Flexion 5 5    Interosseous 5 5    Hip Flexion 5 5    Knee Flexion 5 5    Knee Extension 5 5    Dorsiflexion 5 5    Plantarflexion 5 5      Reflexes:   Right Left Comments    Biceps (C5/6) 2 2    Brachioradialis (C5/6) 2 2     Triceps (C6/7) 2 2     Patellar (L3/4) 2 2     Achilles (S1) 2 2     Hoffman none none     Plantar none none      Sensation:  Sensation normal to pinprick and vibration in distal bilateral upper and lower extremities.    Coordination:  no dysmetria or tremor noted    Labs     Lab Results   Component Value Date    GLUCOSE 133 (H) 11/19/2021    BUN 17 11/19/2021    CO2 27 11/19/2021    CREATININE 0.96 11/19/2021    K 4.3 11/19/2021    NA 138 11/19/2021    CL 105 11/19/2021    CALCIUM 8.5 (L) 11/19/2021       Lab Results   Component Value Date    WBC 14.9 (H) 11/19/2021    HGB 11.8 11/19/2021    HCT 35.5 11/19/2021    MCV 92.2 11/19/2021    PLT 160 11/19/2021     No results found for: PTT, INR  Lab Results   Component Value Date    CHOLTOT 174 02/22/2017    TRIG 249 (H) 02/22/2017    HDL 41 (L) 02/22/2017  LDL 83 02/22/2017     Lab Results   Component Value Date    HGBA1C 5.5 02/19/2017       Lab Results   Component Value Date    COLORU Straw 02/19/2017    CLARITYU Clear 02/19/2017    PROTEINUA Negative 02/19/2017    PHUR 6.0 02/19/2017    LABSPEC 1.053 (H) 02/19/2017    GLUCOSEU Negative 02/19/2017    BLOODU Negative 02/19/2017    LEUKOCYTESUR Negative 02/19/2017    NITRITE Negative 02/19/2017    BILIRUBINUR Negative 02/19/2017    UROBILINOGEN <2.0 02/19/2017    RBCUA 4 (H) 12/29/2016    WBCUA 38 (H) 12/29/2016    BACTERIA Occasional (A) 12/29/2016       No results found for: METHADSCRNUR, METHAMSCRNUR, TCASCRNUR, AMPHETSCRNUR, BARBSCRNUR, BENZOSCRNUR, OPIATESCRNUR, PHENSCRNUR, THCSCRNUR,  COCAINSCRNUR    Lab Results   Component Value Date    TROPONINI <0.04 03/30/2017     Imaging and Diagnostic Studies   CT angiogram head+neck 2017/05/10):  1. Normal CT HEAD with and without contrast for age.   2. Negative CTA head and CTA neck.   3. Severe RIGHT C3-4 and LEFT C6-7 neural foraminal narrowing.     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 Tortuosity of the basilar artery causes mild displacement of the right 6th cranial nerve, of uncertain relation to clinical symptomatology. A variant of vascular compression syndrome cannot be excluded.     CT Head  1.  No acute intracranial abnormality.   2.  No intracranial mass effect or hemorrhage.   3.  Remote infarctions of the anterior limbs of the bilateral internal capsules.   4.  ASPECTS score is 10.   Assessment   Left cranial nerve 4 palsy, causing vertical diplopia at certain angles (left upper) and improved with contralateral head tilt. Interestingly, previous episode also occurred in the post-operative period. She recovered after 5 months. This time symptoms are significantly milder, so should recover faster. Therefore, this an recrudescence or flare of previous ischemic left CN4 nucleus that did not heal 100%. No signs of other cranial neuropathies, myasthenia.    Plan     Follow-up with [facility]  No further recommendations from neurology. Will sign off, but do not hesitate to contact the service for any questions or concerns.    I spent 55 minutes, providing care, interpreting results, documenting, and coordinating the patient's care. I personally reviewed the patient's previous medical records, current laboratory and images, and spent >50% of total time counseling the patient and/or family on their diagnosis, prognosis, medication regimen, other management and answering questions.    I contacted surgeon to discuss (interpretation of test results, coordination of  care).    Portions of this note may have been copied forward with edits on 11/19/2021.  I have reviewed and updated the history, physical exam, data, assessment, and plan of the note so that it reflects my evaluation and management of the patient.     Lovie Macadamia, MD  Assistant Professor, [facility]  Neurology and Rehabilitation Medicine/[employer]

## 2021-11-19 NOTE — Unmapped (Addendum)
Received call from Thoracic Surgery requesting Code Stroke be called and patient be transported to CT immediately. Primary RN took patient to CT. This Consulting civil engineer called Stroke Team.    NIHSS 0  BG 124  Woke up with symptoms at 0715  Called [facility] Stroke Team at [phone]    Received call back from Stroke Team: Requested to talk to ordering provider, not RN. Called Thoracic Surgery Team: relayed the ICU MD number to Thoracic MD to discuss who was going to call Stroke Team. ICU MD Dr. Cletus Gash called Stroke Team with RN present.

## 2021-11-19 NOTE — Unmapped (Signed)
Notified MD that pt aline has a dampened waveform and is not correlating with cuff pressures and has no blood return. Multiple troubleshooting methods done. Will page thoracic surgery to see if we can discontinue aline.      @1318  Pt c/o 10/10 pain in L upper shoulder/back area . No PRN medications due at this time. Dr. Joyce Gross notified. New orders placed.     @1325  CNP Neil Crouch placed order to d/c aline.

## 2021-11-19 NOTE — Unmapped (Signed)
Care Management/Social Work Assessment      Patient Information     Patient Name: Joanna Lopez  MRN: [id number]  Hospital Day: 1  Inpatient/Observation: Inpatient  Admit Date: 11/18/2021  Admission Diagnosis: Lung nodule, solitary [R91.1]  Attending provider: Janett Billow, MD    PCP: [name], MD  Home Pharmacy:              [facility] [id number] - [place], [place] - [address] AT [facility] [address]  [address]  [place] [address]  Phone: [phone]     [facility] - [place], [place] - [address]  [address]  [place] [address]  Phone: [phone]        Pertinent Medications  Anticoagulation therapy: No  New Diabetic: No      Issues related to obtaining medications: N/A    Payor Information   Medical Insurance Coverage:  Payor: MEDICARE / Plan: MEDICARE A AND B / Product Type: Medicare /   Secondary Payor: AARP    Functional Assessment   Functional Assessment  Assessment Information Obtained From:: Patient, Children, Chart Review  May We Obtain Collateral Information From Family, Friends and Neighbors?: Yes  Current Mental Status: Oriented to Person, Awake, Oriented to Place, Oriented to Time, Oriented to Situation  Mental Status Prior to Admission: Oriented to Situation, Oriented to Time, Oriented to Place, Oriented to Person, Awake  Mental Health History: No  Suicide Attempts: No  Activities of Daily Living: Independent  Work History: Retired  Marital Status: Widowed  Relative Search Completed: No  Demographics Correct:: Yes  Discharge Destination: Home    Current Living Arrangements   Current Living Arrangements  Current Living Arrangements: Home  Type of Housing: House  Who do you live with?: Alone  One Story or Two (check all that apply): One Story  History of Falls?: No    Electrical engineer at Home: Not Applicable  Was any abuse reported by patient?: No    Support Systems   Emergency  contact: Extended Emergency Contact Information  Primary Emergency Contact: Straw,Alace   [place]  Home Phone: [phone]  Relation: Daughter  Preferred language: English  Interpreter needed? No  Secondary Emergency Contact: [place]  Mobile Phone: [phone]  Relation: Son  Preferred language: English  Interpreter needed? No    Support Systems  Legal Status: HCPOA  Name of Guardian/POA/ Payee and Phone Number: Alace Straw/Daughter  [phone]  Primary Caregiver: Self  Times of available support: No 24/7 hands on available  Marital Status: Widowed  Relative Search Completed: No  Demographics Correct:: Yes  Discharge Destination: Home  Next of Kin: Alace Straw  Next of Kin Relationship:  Daughter  Next of Kin Phone Number: [phone]  Assessment Information Obtained From:: Patient, Children, Chart Review    Other Pertinent Information     SW/CM will follow for discharge planning. SW met with the patient and her daughter at bedside to introduce SW/CM's role, DC planning, and provide support. Patient shared she was independent prior to admission with no current services or DME at home (including O2). She plans to return home at DC once medically stable. She has good family support and reports no needs from SW/CM. Daughter, Alace, is POA and will have AD forms brought in to [facility] as she has completed POA and Living Will.  Daughter's contact information is on the patient's facesheet. SW/CM will continue to follow.     Advance Directives (For Healthcare)  Advance Directive: Patient does not have advance directive  No Advanced Directive: Patient would NOT like information about advanced directives, forgoing or with-drawing life-sustaining treatment, and withholding resuscitative services  Healthcare Agent Appointed: No  Pre-existing DNR/DNI Order: No  Patient Requests Assistance: No    Discharge Plan     Met with patient to initiate discussion regarding discharge  planning. Introduced self and role of case management/social work and provided Tour manager.       Anticipated Discharge Plan: return home. NO needs anticipated at this time. Watch O2 needs.     Anticipated Discharge Date: TBD    Anticipated Transportation: family    Patient/Family aware and taking part in the discharge plan.  Patient/family educated that once post-acute care needs have been identified, a provider list applicable to the identified post-acute care needs as well as the insurance provider will be provided, and patient/family have the freedom to choose their provider(s); financial interest(s) are disclosed as appropriate.         Tillman Abide, MSW, LSW  Phone Number: [phone]

## 2021-11-19 NOTE — Unmapped (Signed)
MICU Attending Note    I independently examined and evaluated the patient. The X-rays, and labs were reviewed. The case was discussed in detail with the MICU house staff and a plan for medical care was arranged. This note may contain information copied forward from the note written by me the day before. I have reviewed and updated the history, physical exam, data, assessment, and plan of this note completely so that it reflects the evaluation and management of the patient today.     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 Patient underwent left Lingulectomy and presents to the ICU post op day for close monitoring.    Overnight: Patient reported L eye diplopia in the AM    Allergies   Allergen Reactions    Penicillins Anaphylaxis     Other reaction(s): Facial Swelling  Has patient had a PCN reaction causing immediate rash, facial/tongue/throat swelling, SOB or lightheadedness with hypotension: Yes  Has patient had a PCN reaction causing severe rash involving mucus membranes or skin necrosis: Yes  Has patient had a PCN reaction that required hospitalization: No  Has patient had a PCN reaction occurring within the last 10 years: No  If all of the above answers are NO, then may proceed with Cephalosporin use.    Adhesive      Other reaction(s): breaks out and itches if on for long time    Formaldehyde Analogues     Nsaids (Non-Steroidal Anti-Inflammatory Drug)      Severe GI issues    Promethazine Hcl      Restless legs    Latex Rash     States she has a sensitivity to latex after being around it for days, she gets a rash: she states that when she wore her cpap machine for 14 days she developed a rash on her face other than this she has no problems stating her dentist wears latex gloves and she has no problems with latex gloves.     Sulfa (Sulfonamide Antibiotics) Rash      Vitals:    11/19/21 1113   BP:    Pulse:    Resp:    Temp:    SpO2: 97%       Physical Exam:  Physical Exam  Vitals reviewed.   HENT:      Head: Normocephalic.   Eyes:      Extraocular Movements: Extraocular movements intact.      Pupils: Pupils are equal, round, and reactive to light.      Comments: Reports that left eye diplopia previously reported is significantly improved   Cardiovascular:      Rate and Rhythm: Normal rate and regular rhythm.   Pulmonary:      Effort: Pulmonary effort is normal.      Comments: On RA  Left chest tube be serosanguinous output, to suction, no airleak  Skin:     General: Skin is warm.   Neurological:      General: No focal deficit present.      Mental Status: She is alert and oriented to person, place, and time.      Comments: Reports 10/10 pain        Social history and family history reviewed.   No relevant family history or social history unless stated in the HPI.    Labs and imaging  were reviewed.      Assessment and Plan:  # Lingulectomy for lUL  # History of VATS with right middle lobectomy in 2018 for carcinoid tumor  # OSA s.p UPPP  # HTN  # History of CVA on ASA at home  # HLD    Plan:  Neuro;  Wake and alert  Reports pain after receiving tramadol and tylenol  Will Add PRN dilaudid  Continue home paxil  Left eye diplopia reported early this AM. Patient reports significant improvement at this time. CT head without acute process. Neurology team consulted.    Pulmonary:  Post op day 1 - lingulectomy  Left sided chest tube to -20 cm suction. Output is clear to serosanguinous.     CV;  Hemodynamically stable  Holding home lisinopril  Continue home atorvastatin  Bradycardic, so metoprolol dose held this AM    GI;  Diet  Bowel regimen    Renal;  Monitor UOP and renal function    ID:  No acute infection at this time    Heme/onc;  Prior history of carcinoid  LUL nodule - pending official path report - concern for adenocarcinoma    Code: Full  DVT ppx: SQH  Lines: PIV, left chest  tube  Nutrition: diet  Disposition: ICU      I spent a total of 38 minutes of critical care time caring for this patient with residents/fellows, including direct patient contact, management of life supporting systems, review of data such as imaging and labs, and care oriented discussions with team members. This time excludes time spend performing procedures.

## 2021-11-20 ENCOUNTER — Inpatient Hospital Stay: Admit: 2021-11-20 | Payer: MEDICARE

## 2021-11-20 LAB — BASIC METABOLIC PANEL
Anion Gap: 7 mmol/L (ref 3–16)
BUN: 17 mg/dL (ref 7–25)
CO2: 25 mmol/L (ref 21–33)
Calcium: 8 mg/dL — ABNORMAL LOW (ref 8.6–10.3)
Chloride: 104 mmol/L (ref 98–110)
Creatinine: 0.89 mg/dL (ref 0.60–1.30)
EGFR: 69
Glucose: 137 mg/dL — ABNORMAL HIGH (ref 70–100)
Osmolality, Calculated: 286 mOsm/kg (ref 278–305)
Potassium: 3.9 mmol/L (ref 3.5–5.3)
Sodium: 136 mmol/L (ref 133–146)

## 2021-11-20 LAB — MAGNESIUM: Magnesium: 2.2 mg/dL (ref 1.5–2.5)

## 2021-11-20 LAB — CBC
Hematocrit: 35.3 % (ref 35.0–45.0)
Hemoglobin: 11.7 g/dL (ref 11.7–15.5)
MCH: 30.8 pg (ref 27.0–33.0)
MCHC: 33.2 g/dL (ref 32.0–36.0)
MCV: 92.9 fL (ref 80.0–100.0)
MPV: 9.5 fL (ref 7.5–11.5)
Platelets: 155 10*3/uL (ref 140–400)
RBC: 3.8 10*6/uL (ref 3.80–5.10)
RDW: 14.4 % (ref 11.0–15.0)
WBC: 13 10*3/uL — ABNORMAL HIGH (ref 3.8–10.8)

## 2021-11-20 LAB — PHOSPHORUS: Phosphorus: 3 mg/dL (ref 2.1–4.5)

## 2021-11-20 MED ORDER — oxyCODONE (ROXICODONE) immediate release tablet 5 mg
5 | Freq: Four times a day (QID) | ORAL
Start: 2021-11-20 — End: 2021-11-21
  Administered 2021-11-20 – 2021-11-21 (×5): 5 mg via ORAL

## 2021-11-20 MED ORDER — potassium chloride (KLOR-CON M20) CR tablet 40 mEq
20 | Freq: Once | ORAL | Status: AC
Start: 2021-11-20 — End: 2021-11-20
  Administered 2021-11-20: 16:00:00 40 meq via ORAL

## 2021-11-20 MED ORDER — atorvastatin (LIPITOR) tablet 80 mg
80 | Freq: Every day | ORAL
Start: 2021-11-20 — End: 2021-11-21
  Administered 2021-11-21: 14:00:00 80 mg via ORAL

## 2021-11-20 MED FILL — POTASSIUM CHLORIDE ER 20 MEQ TABLET,EXTENDED RELEASE(PART/CRYST): 20 20 MEQ | ORAL | Qty: 2

## 2021-11-20 MED FILL — GABAPENTIN 100 MG CAPSULE: 100 100 MG | ORAL | Qty: 1

## 2021-11-20 MED FILL — HEPARIN (PORCINE) 5,000 UNIT/ML INJECTION SOLUTION: 5000 5,000 unit/mL | INTRAMUSCULAR | Qty: 1

## 2021-11-20 MED FILL — OXYCODONE 5 MG TABLET: 5 5 MG | ORAL | Qty: 1

## 2021-11-20 MED FILL — STIMULANT LAXATIVE PLUS 8.6 MG-50 MG TABLET: 8.6-50 8.6-50 mg | ORAL | Qty: 2

## 2021-11-20 MED FILL — TYLENOL 325 MG TABLET: 325 325 mg | ORAL | Qty: 3

## 2021-11-20 MED FILL — HYDROMORPHONE 1 MG/ML INJECTION SYRINGE: 1 1 mg/mL | INTRAMUSCULAR | Qty: 1

## 2021-11-20 MED FILL — PAROXETINE 20 MG TABLET: 20 20 MG | ORAL | Qty: 1

## 2021-11-20 MED FILL — METOPROLOL TARTRATE 5 MG/5 ML INTRAVENOUS SOLUTION: 5 5 mg/5 mL | INTRAVENOUS | Qty: 5

## 2021-11-20 MED FILL — TRAMADOL 50 MG TABLET: 50 50 mg | ORAL | Qty: 2

## 2021-11-20 MED FILL — TRAMADOL 50 MG TABLET: 50 50 mg | ORAL | Qty: 1

## 2021-11-20 MED FILL — ATORVASTATIN 10 MG TABLET: 10 10 MG | ORAL | Qty: 1

## 2021-11-20 MED FILL — POLYETHYLENE GLYCOL 3350 17 GRAM ORAL POWDER PACKET: 17 17 gram | ORAL | Qty: 1

## 2021-11-20 NOTE — Unmapped (Signed)
Surgery Progress Note    Patient: Joanna Lopez  Admit Date: 11/18/2021  OR Date: 11/18/2021    SUBJECTIVE     No acute events overnight  Afebrile, HDS  On 3L Nasal cannula but saturating 97%  Foley removed and voided  CT with 280cc out      OBJECTIVE   Vitals:  Temp:  [97.5 F (36.4 C)-98.4 F (36.9 C)] 97.6 F (36.4 C)  Heart Rate:  [57-76] 75  Resp:  [11-24] 21  BP: (98-132)/(61-91) 110/68    Date 11/19/21 0700 - 11/20/21 0659 11/20/21 0700 - 11/21/21 0659   Shift 0700-1459 1500-2259 2300-0659 24 Hour Total 0700-1459 1500-2259 2300-0659 24 Hour Total   INTAKE   P.O. 240 1330 120 1690         P.O. 240 1330 120 1690       IV Piggyback  35.5  35.5         Volume (mL) (magnesium sulfate in sterile water 50 mL IVPB 2 g)  35.5  35.5       Shift Total(mL/kg) 240(2.8) 1365.5(15.8) 120(1.4) 1725.5(20)       OUTPUT   Urine(mL/kg/hr) 465(0.7) 0(0) 275(0.4) 740(0.4)         Urine  0 275 275         Urine Occurrence  0 x 2 x 2 x         Output (mL) ([REMOVED] IUC (Foley) Straight-tip;Non-latex;Temperature probe;Double-lumen 16 Fr.) 465   465       Emesis/NG output  0 0 0         Emesis  0 0 0         Emesis Occurrence  0 x 0 x 0 x       Stool  0 0 0         Stool Occurrence  0 x 0 x 0 x         Stool  0 0 0       Chest Tube 100 90 90 280         Output (mL) (Chest Tube 1 Left Pleural 28 Fr.) 100 90 90 280       Shift Total(mL/kg) 565(6.5) 90(1) 365(4.2) 1020(11.8)       Weight (kg) 86.4 86.4 86.4 86.4 86.4 86.4 86.4 86.4       Physical Exam:  Gen: NAD, A+Ox3  CV: RRR,   Resp: CTAB, no respiratory distress  Abd: Soft, NT/ND  Ext: Warm and well perfused, SCD  GU: Foley in draining clear yellow urine  Wound: C/D/I, Chest tube with 95cc of SS output    Labs:  Recent Labs     11/18/21  1321 11/18/21  1734 11/19/21  0644   WBC  --  14.3* 14.9*   HGB 12.1 13.4 11.8   HCT  --  40.4 35.5   PLT  --  157 160     Recent Labs     11/18/21  1734 11/19/21  0644   NA 137 138   K 4.5 4.3   CL 108 105   CO2 23 27   BUN 17 17    CREATININE 0.91 0.96   GLUCOSE 136* 133*   CALCIUM 8.6 8.5*   MG 1.7 1.7   PHOS 4.3 3.9     Recent Labs     11/19/21  0655   POCGMD 124*     No results for input(s): AST, ALT, BILITOT, BILIDIRECT, ALKPHOS, ALBUMIN, GGT, AMYLASE, LIPASE  in the last 72 hours.  No results for input(s): INR, PROTIME in the last 72 hours.    Current Medications:  Scheduled Meds:   acetaminophen  975 mg Oral Q6H    atorvastatin  10 mg Oral Daily 0900    gabapentin  100 mg Oral Q8H    heparin  5,000 Units Subcutaneous 3 times per day    metoprolol tartrate  5 mg Intravenous Q6H SCH    PARoxetine  20 mg Oral Daily 0900    polyethylene glycol  17 g Oral DAILY 0700    senna-docusate  2 tablet Oral BID     Continuous Infusions:  PRN Meds: bisacodyL, HYDROmorphone **OR** HYDROmorphone, ICU electrolyte replacement protocol **AND** Initiate electrolyte replacement protocol, magnesium hydroxide, ondansetron, traMADol **OR** traMADol    ASSESSMENT/PLAN   Joanna Lopez is a 71 y.o. 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    Today's plan:  -- Normalize  -- CT out  -- Continue pulmonary toilet  -- Can go to floor later today if well appearing        Pain: Multimodal pain regimen   CV: HDS  PULM: Pulmonary toilet, remove chest tube today  Diet: Regular   GU/RENAL: Dc foley  HEME: no issues  ID: no issues  ENDOCRINE: no issues  PPX: SQH, SCD,  DISPO: floor    Final plan per attending physician.      [place] Custard [facility]  11/20/2021

## 2021-11-20 NOTE — Unmapped (Signed)
MICU Attending Note    I independently examined and evaluated the patient. The X-rays, and labs were reviewed. The case was discussed in detail with the MICU house staff and a plan for medical care was arranged. This note may contain information copied forward from the note written by me the day before. I have reviewed and updated the history, physical exam, data, assessment, and plan of this note completely so that it reflects the evaluation and management of the patient today.     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 Patient underwent left Lingulectomy and presents to the ICU post op day for close monitoring.    Overnight: No acute events overnight. Patient continue s to experience pain.    Allergies   Allergen Reactions    Penicillins Anaphylaxis     Other reaction(s): Facial Swelling  Has patient had a PCN reaction causing immediate rash, facial/tongue/throat swelling, SOB or lightheadedness with hypotension: Yes  Has patient had a PCN reaction causing severe rash involving mucus membranes or skin necrosis: Yes  Has patient had a PCN reaction that required hospitalization: No  Has patient had a PCN reaction occurring within the last 10 years: No  If all of the above answers are NO, then may proceed with Cephalosporin use.    Adhesive      Other reaction(s): breaks out and itches if on for long time    Formaldehyde Analogues     Nsaids (Non-Steroidal Anti-Inflammatory Drug)      Severe GI issues    Promethazine Hcl      Restless legs    Latex Rash     States she has a sensitivity to latex after being around it for days, she gets a rash: she states that when she wore her cpap machine for 14 days she developed a rash on her face other than this she has no problems stating her dentist wears latex gloves and she has no problems with latex gloves.     Sulfa (Sulfonamide  Antibiotics) Rash     Vitals:    11/20/21 1100   BP: (!) 123/91   Pulse: 70   Resp: 17   Temp: 97.9 F (36.6 C)   SpO2: 94%       Physical Exam:  Physical Exam  Vitals reviewed.   HENT:      Head: Normocephalic.      Mouth/Throat:      Mouth: Mucous membranes are moist.   Eyes:      Extraocular Movements: Extraocular movements intact.      Pupils: Pupils are equal, round, and reactive to light.   Cardiovascular:      Rate and Rhythm: Normal rate and regular rhythm.   Pulmonary:      Effort: Pulmonary effort is normal.      Comments: On 2L    Abdominal:      General: Bowel sounds are normal.   Musculoskeletal:         General: Normal range of motion.   Neurological:      Mental Status: She is alert.        Social history and family history reviewed.   No relevant family history or social history unless stated in the HPI.    Labs and imaging were reviewed.      Assessment and Plan:  #  Lingulectomy for lUL  # History of VATS with right middle lobectomy in 2018 for carcinoid tumor  # OSA s.p UPPP  # HTN  # History of CVA on ASA at home  # HLD    Plan:  Neuro;  Wake and alert  Reports pain after receiving tramadol PRN, scheduled tylenol and PRN dilaudid  Will schedule oxycodon 5mg  Q6  Continue home paxil  CT head without acute process. Neurology team evaluated.    Pulmonary:  Post op day 2 - lingulectomy  Chest tube Output 280 cc. Chest tube removed by surgery.  Continued recommendation to use IS    CV;  Hemodynamically stable  Holding home lisinopril  Continue home atorvastatin    GI;  Diet  Bowel regimen    Renal;  Monitor UOP and renal function    ID:  No acute infection at this time    Heme/onc;  Prior history of carcinoid  LUL nodule - pending official path report - concern for adenocarcinoma    Code: Full  DVT ppx: SQH  Lines: PIV  Nutrition: diet  Disposition: Floor      I spent a total of 28 minutes of critical care time caring for this patient with residents/fellows, including direct patient contact, management  of life supporting systems, review of data such as imaging and labs, and care oriented discussions with team members. This time excludes time spend performing procedures.

## 2021-11-20 NOTE — Unmapped (Signed)
Anesthesia Post Note    Patient: Joanna Lopez    Procedure(s) Performed: Procedure(s):  Left Thoracoscopic Lingulectomy    Anesthesia type: general endotracheal    Patient location: ICU    Airway: Patent    Post pain: Adequate analgesia    Nausea / Vomiting: Absent    Post-operative Hydration Status: Adequate    Post assessment: no apparent anesthetic complications    Last Vitals:   Vitals:    11/20/21 1400 11/20/21 1500 11/20/21 1600 11/20/21 1700   BP: 116/67 103/68     Pulse: 65 66 78 68   Resp: 15 13 21 21    Temp:  98.1 F (36.7 C)     TempSrc:  Axillary     SpO2: 96% 97% 96% 99%   Weight:       Height:            Post vital signs: stable    Level of consciousness:     Complications:  There were no known notable events for this encounter.  Chart reviewed, neurology consult reviewed. Patient stable, and has follow up with [facility].

## 2021-11-20 NOTE — Unmapped (Signed)
Medication Reconciliation  [place] [facility]    Patient Name: Joanna RichBarbara J Lopez 1950/12/23    Medication reconciliation has been completed by: Pharmacy Technician    Source(s) of information:  in person with patient     Primary Care Physician: Trish Fountainhetna Mital, MD    Pharmacy:   [facility] [id number] [place] [address] - [address] [facility] [address]  [address]  [place] [id number]  Phone: [phone]     [facility] [place] [address]  [address]  [place] [id number]  Phone: [phone]       Allergies   Allergen Reactions    Penicillins Anaphylaxis     Other reaction(s): Facial Swelling  Has patient had a PCN reaction causing immediate rash, facial/tongue/throat swelling, SOB or lightheadedness with hypotension: Yes  Has patient had a PCN reaction causing severe rash involving mucus membranes or skin necrosis: Yes  Has patient had a PCN reaction that required hospitalization: No  Has patient had a PCN reaction occurring within the last 10 years: No  If all of the above answers are NO, then may proceed with Cephalosporin use.    Adhesive      Other reaction(s): breaks out and itches if on for long time    Formaldehyde Analogues     Nsaids (Non-Steroidal Anti-Inflammatory Drug)      Severe GI issues    Promethazine Hcl      Restless legs    Latex Rash     States she has a sensitivity to latex after being around it for days, she gets a rash: she states that when she wore her cpap machine for 14 days she developed a rash on her face other than this she has no problems stating her dentist wears latex gloves and she has no problems with latex gloves.     Sulfa (Sulfonamide Antibiotics) Rash       Medication list: Documented in review PTA medication tab.    Allergies updated:  none    Medications flagged for removal and reason for discontinuation:  Aspirin 81mg  - no longer taking   Acetaminophen- no longer taking  Oscal- no longer  taking  Lidocaine patch- no longer taking     Medications added to PTA medication list:  Diclofenac sodium solution  1 drop each ear Prn     Medications adjusted or instructions changed:   Ropinirole changed from 0.25 mg to 0.5 mg daily   Excedrin changed from 2 tab to 1 tab prn  Atorvastatin changed from 10 mg to 80 mg daily   Creon changed 2 capsule  to 1 capsule Prn   Melatonin changed from 3 mg to 3-5 mg at Bedtime       Other notes:  Pt states that doctor dropped her aspirin down to 81 mg until her surgery she is suppose to go back to taking 325 mg   Pt  states that she only takes Creon occasionally       Additions and modifications to the prior to admission medication list have been documented within Review PTA Medications tab. Deletions have been documented above. To my knowledge the medication reconciliation is accurate as of 11/20/2021 11:24 AM.  Medication histories are being confirmed with pharmacy fill records, as available.       Jermani Pund, RXT  11/20/2021 11:24 AM  [phone]

## 2021-11-21 LAB — BASIC METABOLIC PANEL
Anion Gap: 5 mmol/L (ref 3–16)
BUN: 18 mg/dL (ref 7–25)
CO2: 27 mmol/L (ref 21–33)
Calcium: 8.4 mg/dL — ABNORMAL LOW (ref 8.6–10.3)
Chloride: 105 mmol/L (ref 98–110)
Creatinine: 0.92 mg/dL (ref 0.60–1.30)
EGFR: 67
Glucose: 106 mg/dL — ABNORMAL HIGH (ref 70–100)
Osmolality, Calculated: 286 mOsm/kg (ref 278–305)
Potassium: 4.5 mmol/L (ref 3.5–5.3)
Sodium: 137 mmol/L (ref 133–146)

## 2021-11-21 LAB — CBC
Hematocrit: 35.7 % (ref 35.0–45.0)
Hemoglobin: 11.7 g/dL (ref 11.7–15.5)
MCH: 30.5 pg (ref 27.0–33.0)
MCHC: 32.7 g/dL (ref 32.0–36.0)
MCV: 93.2 fL (ref 80.0–100.0)
MPV: 9.5 fL (ref 7.5–11.5)
Platelets: 159 10*3/uL (ref 140–400)
RBC: 3.83 10*6/uL (ref 3.80–5.10)
RDW: 14.3 % (ref 11.0–15.0)
WBC: 12.8 10*3/uL — ABNORMAL HIGH (ref 3.8–10.8)

## 2021-11-21 LAB — PHOSPHORUS: Phosphorus: 2.9 mg/dL (ref 2.1–4.5)

## 2021-11-21 LAB — MAGNESIUM: Magnesium: 2 mg/dL (ref 1.5–2.5)

## 2021-11-21 MED ORDER — magnesium oxide (MAG-OX) tablet 400 mg
400 | Freq: Two times a day (BID) | ORAL
Start: 2021-11-21 — End: 2021-11-21
  Administered 2021-11-21: 14:00:00 400 mg via ORAL

## 2021-11-21 MED ORDER — sod phos di, mono-K phos mono (K-PHOS NEUTRAL) 250 mg tablet Tab 500 mg
250 | ORAL | Status: AC
Start: 2021-11-21 — End: 2021-11-21
  Administered 2021-11-21 (×2): 500 mg via ORAL

## 2021-11-21 MED ORDER — naloxone (NARCAN) 4 mg/actuation Spry
4 | NASAL | 1 refills | Status: AC | PRN
Start: 2021-11-21 — End: ?
  Filled 2021-11-21: qty 2, 1d supply, fill #0

## 2021-11-21 MED ORDER — senna-docusate (SENNA-S) 8.6-50 mg per tablet
8.6-50 | ORAL_TABLET | Freq: Every day | ORAL | 0 refills | Status: AC
Start: 2021-11-21 — End: 2021-12-05
  Filled 2021-11-21: qty 28, 14d supply, fill #0

## 2021-11-21 MED ORDER — gabapentin (NEURONTIN) 300 MG capsule
300 | ORAL_CAPSULE | ORAL | 0 refills | Status: AC
Start: 2021-11-21 — End: 2021-12-12
  Filled 2021-11-21: qty 42, 21d supply, fill #0

## 2021-11-21 MED ORDER — polyethylene glycol (GLYCOLAX) 17 gram/dose powder
17 | Freq: Every day | ORAL | 0 refills | 14.00000 days | Status: AC
Start: 2021-11-21 — End: 2021-12-05
  Filled 2021-11-21: qty 238, 14d supply, fill #0

## 2021-11-21 MED ORDER — acetaminophen (TYLENOL) 325 MG tablet
325 | ORAL_TABLET | Freq: Three times a day (TID) | ORAL | 0 refills | 11.00000 days | Status: AC
Start: 2021-11-21 — End: 2021-12-05
  Filled 2021-11-21: qty 126, 14d supply, fill #0

## 2021-11-21 MED ORDER — traMADoL (ULTRAM) 50 mg tablet
50 | ORAL_TABLET | Freq: Four times a day (QID) | ORAL | 0 refills | PRN
Start: 2021-11-21 — End: 2021-11-28

## 2021-11-21 MED ORDER — polyethylene glycol (MIRALAX) packet 17 g
17 | Freq: Two times a day (BID) | ORAL
Start: 2021-11-21 — End: 2021-11-21

## 2021-11-21 MED FILL — OXYCODONE 5 MG TABLET: 5 5 MG | ORAL | Qty: 1

## 2021-11-21 MED FILL — POLYETHYLENE GLYCOL 3350 17 GRAM ORAL POWDER PACKET: 17 17 gram | ORAL | Qty: 1

## 2021-11-21 MED FILL — TRAMADOL 50 MG TABLET: 50 50 mg | ORAL | 7 days supply | Qty: 28 | Fill #0

## 2021-11-21 MED FILL — HEPARIN (PORCINE) 5,000 UNIT/ML INJECTION SOLUTION: 5000 5,000 unit/mL | INTRAMUSCULAR | Qty: 1

## 2021-11-21 MED FILL — PHOSPHA NEUTRAL 250 MG TABLET: 250 250 mg | ORAL | Qty: 2

## 2021-11-21 MED FILL — PAROXETINE 20 MG TABLET: 20 20 MG | ORAL | Qty: 1

## 2021-11-21 MED FILL — GABAPENTIN 100 MG CAPSULE: 100 100 MG | ORAL | Qty: 1

## 2021-11-21 MED FILL — TYLENOL 325 MG TABLET: 325 325 mg | ORAL | Qty: 3

## 2021-11-21 MED FILL — METOPROLOL TARTRATE 5 MG/5 ML INTRAVENOUS SOLUTION: 5 5 mg/5 mL | INTRAVENOUS | Qty: 5

## 2021-11-21 MED FILL — STIMULANT LAXATIVE PLUS 8.6 MG-50 MG TABLET: 8.6-50 8.6-50 mg | ORAL | Qty: 2

## 2021-11-21 MED FILL — MAGNESIUM OXIDE 400 MG (241.3 MG MAGNESIUM) TABLET: 400 400 mg | ORAL | Qty: 1

## 2021-11-21 MED FILL — ATORVASTATIN 80 MG TABLET: 80 80 MG | ORAL | Qty: 1

## 2021-11-21 NOTE — Unmapped (Signed)
Physician Discharge Summary     Patient ID:  Joanna Lopez  [id number]  1950/08/20    Admit date: 11/18/2021    Discharge date: 11/21/2021    Attending Physician: Janett Billow, MD     Admission Diagnoses:   Lung nodule, solitary [R91.1]    Discharge Diagnoses:   Active Problems:    * No active hospital problems. *    Past Medical History:   Diagnosis Date    Anxiety     Basal cell cancer     Hyperlipidemia     Hypertension     Lung nodule     OSA (obstructive sleep apnea)     RLS (restless legs syndrome)        Indication for Admission: Joanna Lopez is a 71 y.o.  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    Operations/Procedures Performed:   1. Left Thoracoscopic Lingulectomy with lymph node dissection s/p 7/11.     Hospital Course: Patient admitted on 11/18/2021 and underwent abovementioned procedure(s) on 11/18/2021. Tolerated the procedure well with no complications. Please see full operative report for further details regarding the operation. Postoperatilvely transferred to the ICU in stable condition. Pain controlled post-op with multimodal pain regimen. On POD1, ICU team noticed a left eye diplopia, CT head was obtained for suspected stroke which was negative, and neurology was consulted with an assessment of left cranial 4 palsy which happened after anesthesia during her last operation and recovered well.  Foley removed on POD 1 and voided spontaneously. Chest tube was placed on low wall suction post-op and switched to water seal on POD1 with no evidence of an air leak. Serial chest xrays were stable and chest tube was removed 11/20/21 with no issues. Diet was advanced and tolerated this well. Marland Kitchen PT/OT worked with patient and recommended home with PRN assist.     At time of discharge, the patient was tolerating oral food and hydration, voiding spontaneously, had return of bowel function, was ambulating without  difficulty, and pain was controlled on oral medications. The patient was determined to be suitable for discharge and the patient felt comfortable with that decision. Patient was discharged in good condition.    Consults: Pulmonology, Neurology, PT/OT    Significant Diagnostic Studies: None    Disposition: Home    Discharge Medications:  Current Discharge Medication List        START taking these medications    Details   acetaminophen (TYLENOL) 325 MG tablet Take 3 tablets (975 mg total) by mouth every 8 hours for 14 days.  Qty: 126 tablet, Refills: 0      polyethylene glycol (MIRALAX) 17 gram packet Take 17 g by mouth daily for 14 days.  Qty: 14 each, Refills: 0      senna-docusate (SENNA-S) 8.6-50 mg per tablet Take 2 tablets by mouth daily for 14 days.  Qty: 28 tablet, Refills: 0      traMADoL (ULTRAM) 50 mg tablet Take 1 tablet (50 mg total) by mouth every 6 hours as needed for up to 7 days.  Qty: 28 tablet, Refills: 0    Associated Diagnoses: Lung nodule      gabapentin (NEURONTIN) 300 MG capsule Take 1 capsule (300 mg total) by mouth 3 times a day for 7 days, THEN 1 capsule (300 mg total) in the morning and at bedtime for 7 days, THEN 1 capsule (300 mg total) daily  for 7 days.  Qty: 42 capsule, Refills: 0      naloxone (NARCAN) 4 mg/actuation Spry Apply 1 spray in one nostril if needed. Call 911. May repeat dose in other nostril if no response in 3 minutes.  Qty: 2 each, Refills: 1           CONTINUE these medications which have NOT CHANGED    Details   aspirin 325 MG tablet Take 1 tablet (325 mg total) by mouth daily.  Qty: 30 tablet, Refills: 0      aspirin-acetaminophen-caffeine (EXCEDRIN) 250-250-65 mg per tablet Take 1 tablet by mouth every 6 hours as needed for Pain.      atorvastatin (LIPITOR) 80 MG tablet Take 1 tablet (80 mg total) by mouth daily.      diclofenac (VOLTAREN) 0.1 % ophthalmic solution 1 drop if needed (ear itching). ears      lipase/protease/amylase (CREON ORAL) Take 1 capsule by mouth  if needed (stomach pain).      lisinopriL (PRINIVIL) 10 MG tablet Take 1 tablet (10 mg total) by mouth daily. 10 mg (half tablet) daily      MELATONIN ORAL Take 3-5 mg by mouth at bedtime. PRN for sleep      multivitamin (THERAGRAN) tablet Take 1 tablet by mouth daily.      PARoxetine (PAXIL) 20 MG tablet Take 1 tablet (20 mg total) by mouth daily.      roPINIRole (REQUIP) 0.5 MG tablet Take 1 tablet (0.5 mg total) by mouth daily.             Patient Instructions:      Division of Thoracic Surgery  [facility]  [address]  [place], [place]  [address]  Office Phone(Main): [phone]  Thoracic Surgery Scheduler: [phone]    Janett Billow, MD  Delman Cheadle, MD  Neil Crouch, CNP     Patient Discharge Instructions      Your chest tube was removed before discharge.  You have a suture in place that is tied down to your skin where your chest tube was.  This suture is covered by an occlusive dressing. You may shower with the dressing on.  You can remove this dressing 48 hours after your chest tube was removed.  Once the dressing is removed, leave the site open to air for optimal healing.  Your suture will be removed at your clinic follow up appointment (usually in 2 weeks).      Please expect yellow or pink drainage from the chest tube site after the chest tube is removed and then again after the chest tube stitch is removed.  The drainage can last up to a week, and this is normal. It is ok to place dry gauze over the site and cover with a loose piece of tape.  Please change dry gauze twice daily.  Please call the office if the if the drainage turns bloody or foul smelling.    No bathing in a tub, swimming in a pool, or soaking in a hot tub for two weeks following your surgery.  You may shower. Gently rub antibacterial soap over the wound, let water run over, and gently pat dry when you get out of the shower, do not rub or scrub as this can irritate the site.    No lifting of anything  greater than 20 lbs until your first post-operative visit. At your office appointment, please discuss lifting restrictions with your physician.    No driving  until your post-operative visit or while using narcotic pain medication.    No airline flying until your first post-operative office visit.    We recommend that you follow a regular diet unless you were told otherwise.  We will provide you with a prescription for a colace or other stool softener.  Should you experience constipation, please try over-the-counter medications including Miralax or Metamucil per package directions and contact our office should the condition worsen.    We recommend you follow up with your primary care physician within one month of your surgery.    Please call the office if you are experiencing any of the following: a fever greater than 101 degrees, increased pain, increased shortness of breath, wound drainage or redness.    Please be aware that narcotic pain medications are strictly regulated by the state of [place].  You should wean narcotic pain medication over the next 2 weeks at home and transition to over the counter pain control.  Narcotics can not be refilled over the phone. Refills must be picked up in person at the main hospital front desk.  Please call the office to have prescriptions refilled; please allow at least two days; please call between the hours of 8:00am and 5:00pm weekdays for prescription refills; prescriptions will not be refilled on weekends or evenings.     If you were told to obtain a chest x-ray prior to your follow up, please do so at the [facility] on the 2nd floor of the [facility] the same day of your post-operative office visit (please arrive 15 minutes prior to your post-op appointment for the chest X-ray).     Please call the office with any additional questions or concerns; if you have an emergency, please call 911.        APPOINTMENTS: If your appointment is not yet  scheduled or if you have a question about your appointment, please call our scheduler: Amanda CockayneMegan Meadors [phone].     Future Appointments   Date Time Provider Department Center   12/04/2021  2:00 PM Janett BillowSandra Starnes, MD [facility]   04/16/2022 11:30 AM Ivery Qualeaniel M Tanase, MD [facility]       Art BuffBIEDA Phylicia Mcgaugh, MD  11/21/2021

## 2021-11-21 NOTE — Unmapped (Signed)
Division of Thoracic Surgery  [facility]  [address], [address]  [place], [place]  [id number]  Office Phone(Main): [phone]  Thoracic Surgery Scheduler: [phone]    Janett BillowSandra Starnes, MD  Delman Cheadleobert Van Haren, MD  Neil CrouchEmily Fogel, CNP     Patient Discharge Instructions      Your chest tube was removed before discharge.  You have a suture in place that is tied down to your skin where your chest tube was.  This suture is covered by an occlusive dressing. You may shower with the dressing on.  You can remove this dressing 48 hours after your chest tube was removed.  Once the dressing is removed, leave the site open to air for optimal healing.  Your suture will be removed at your clinic follow up appointment (usually in 2 weeks).      Please expect yellow or pink drainage from the chest tube site after the chest tube is removed and then again after the chest tube stitch is removed.  The drainage can last up to a week, and this is normal. It is ok to place dry gauze over the site and cover with a loose piece of tape.  Please change dry gauze twice daily.  Please call the office if the if the drainage turns bloody or foul smelling.    No bathing in a tub, swimming in a pool, or soaking in a hot tub for two weeks following your surgery.  You may shower. Gently rub antibacterial soap over the wound, let water run over, and gently pat dry when you get out of the shower, do not rub or scrub as this can irritate the site.    No lifting of anything greater than 20 lbs until your first post-operative visit. At your office appointment, please discuss lifting restrictions with your physician.    No driving until your post-operative visit or while using narcotic pain medication.    No airline flying until your first post-operative office visit.    We recommend that you follow a regular diet unless you were told otherwise.  We will provide you with a prescription for a colace or other stool softener.  Should you  experience constipation, please try over-the-counter medications including Miralax or Metamucil per package directions and contact our office should the condition worsen.    We recommend you follow up with your primary care physician within one month of your surgery.    Please call the office if you are experiencing any of the following: a fever greater than 101 degrees, increased pain, increased shortness of breath, wound drainage or redness.    Please be aware that narcotic pain medications are strictly regulated by the state of [place].  You should wean narcotic pain medication over the next 2 weeks at home and transition to over the counter pain control.  Narcotics can not be refilled over the phone. Refills must be picked up in person at the main hospital front desk.  Please call the office to have prescriptions refilled; please allow at least two days; please call between the hours of 8:00am and 5:00pm weekdays for prescription refills; prescriptions will not be refilled on weekends or evenings.     If you were told to obtain a chest x-ray prior to your follow up, please do so at the [facility] on the 2nd floor of the [facility] the same day of your post-operative office visit (please arrive 15 minutes prior to your post-op appointment  for the chest X-ray).     Please call the office with any additional questions or concerns; if you have an emergency, please call 911.        APPOINTMENTS: If your appointment is not yet scheduled or if you have a question about your appointment, please call our scheduler: Amanda Cockayne [phone].   Future Appointments   Date Time Provider Department Center   12/04/2021  2:00 PM Janett Billow, MD [facility]   04/16/2022 11:30 AM Ivery Quale, MD [facility]

## 2021-11-21 NOTE — Unmapped (Signed)
Problem: Daily Care  Goal: Daily care needs are met  Description: Assess and monitor ability to perform self care and identify potential discharge needs.  Outcome: Progressing     Problem: Psychosocial Needs  Goal: Demonstrates ability to cope with hospitalization/illness  Description: Assess and monitor patients ability to cope with his/her illness.  Outcome: Progressing  Goal: Collaborate with patient/family to identify patient's goals  Outcome: Progressing     Problem: Discharge Barriers  Goal: Patient's discharge needs are met  Description: Collaborate with interdisciplinary team and initiate plans and interventions as needed.   Outcome: Progressing     Problem: Acute Pain  Description: Patient's pain progressing toward patient's stated pain goal  Goal: Patient displays improved well-being such as baseline levels for pulse, BP, respirations and relaxed muscle tone or body posture  Outcome: Progressing

## 2021-11-21 NOTE — Unmapped (Signed)
Occupational Therapy  Initial Assessment and Tentative Discharge     Name: Joanna Lopez  DOB: 1951-01-25  Attending Physician: Janett Billow, MD  Admission Diagnosis: Lung nodule, solitary [R91.1]  Date: 11/21/2021  Reviewed Pertinent hospital course: Yes  Hospital Course PT/OT: 71 y/o female S/p Left Thoracoscopic Lingulectomy with lymph node dissection s/p 11/18/21. Patient developed double vision overnight, and deemed a recrudence of prior CVA per neurology.  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  Precautions: fall risk, full code  Activity Level: Activity as tolerated    Assessment  Pt's %SpO2 >90 during self-care tasks and functional room mobility on room air.   OT 6 Clicks  Help From Another Person To Put On/Take Off Regular Lower Body Clothing: A little  Help From Another Person Bathing ( including washing ,rinsing,drying): A little  Help From Another Person Toileting, which includes using the toilet, bedpan or urinal: A little  Help From Another Person To Put On/Take Off Regular Upper Body Clothing: A little  Help From Another Person Taking Care Of Personal Grooming: A little  Help From Another Person Eating Meals: None  OT 6 Clicks Score: 19  Assessment: Decreased ADL status, Decreased activity tolerance, Decreased self-care transfers, Decreased IADLs, Decreased Functional Mobility, Decreased Balance                Prognosis for OT goals: Good                        Goals  Goals to be met in: 1 week  Goals to be met by: 11/28/21  Patient will complete supine to sit in prep for ADLs: Modified Independent  Patient will complete functional chair transfer: Modified Independent  Patient will complete toilet transfer: Modified Independent  Patient will complete toileting: Modified Independent  Patient will complete grooming task: Modified Independent (in stance)  Patient will complete lower body  dressing: Modified Independent  Pt Will participate in upper extremity HEP to prep for ADLs: .  Collaborated with: Patient    Recommendation  Plan  Treatment Interventions: ADL retraining, UE strengthening/ROM, Functional transfer training, Activity Tolerance training, Energy Conservation, Therapeutic Activity, Patient/Family training, Excercise  OT Frequency: minimum 3x/week  Recommendation  Recommendation: Home with PRN assist  Equipment Recommendations: None      Problem List  Patient Active Problem List   Diagnosis    GERD (gastroesophageal reflux disease)    Lung nodule, solitary    Diarrhea    Lung nodule    Acute respiratory insufficiency    Essential hypertension    Pain management    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)    Cerebrovascular accident (CVA) determined by clinical assessment (CMS-HCC)    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    History of CVA (cerebrovascular accident)        Past Medical History  Past Medical History:   Diagnosis Date    Anxiety     Basal cell cancer     Hyperlipidemia     Hypertension     Lung nodule     OSA (obstructive sleep apnea)     RLS (restless legs syndrome)         Past Surgical History  Past Surgical History:   Procedure Laterality Date    APPENDECTOMY      CATARACT EXTRACTION, BILATERAL  Bilateral 07/2021    COLONOSCOPY N/A 01/23/2013    Procedure: COLONOSCOPY WITH MAC;  Surgeon: Lurene Shadow, MD;  Location: [facility] ENDOSCOPY;  Service: Gastroenterology;  Laterality: N/A;    FLEXIBLE BRONCHOSCOPY W/ UPPER ENDOSCOPY N/A 09/17/2016    Procedure: BRONCHOSCOPY SUPER D WITH FLUORO AND BIOPSY-EBUS/NAVI;  Surgeon: Johnny Bridge, MD;  Location: [facility] ENDOSCOPY;  Service: Pulmonary;  Laterality: N/A;    LOOP IMPLANT IMPLANTATION N/A 04/07/2017    Procedure: Loop  Implantation;  Surgeon: Tennis Must, MD;  Location: [facility] ELECTROPHYSIOLOGY LAB;  Service: Electrophysiology;  Laterality: N/A;    LOOP REMOVAL Left 04/09/2021    Procedure: Loop Removal;  Surgeon:  Lawerance Cruel, MD;  Location: [facility] MIS SUITE;  Service: Electrophysiology;  Laterality: Left;    submastoid gland removal      THORACOSCOPY Right 11/23/2016    Procedure: RIGHT SIDED VATS MIDDLE LOBE LOBECTOMY,HILAR MEDIASTINAL LYMPH NODE DISSECTION;  Surgeon: Janett Billow, MD;  Location: [facility] OR;  Service: Thoracic;  Laterality: Right;    THORACOSCOPY LOBECTOMY VIDEO ASSISTED Left 11/18/2021    Procedure: Left Thoracoscopic Lingulectomy;  Surgeon: Janett Billow, MD;  Location: [facility] OR;  Service: General;  Laterality: Left;    TONSILLECTOMY      UVULOPALATOPHARYNGOPLASTY         Home Living/Prior Function  Patient able to provide accurate information at this time: Yes  Lives With: Alone  Assistance available: some (not 24 hour) assistance (has emergency pull cords in multiple rooms of home, daughter is available to assist as well)  Type of Home[facility]  Home Entry: No steps to enter  Home Layout: One level  Bathroom Shower/Tub: Pension scheme manager: grab bars around [employer]: Built-in shower seat;Hand-held shower;Grab bars in shower  Home Equipment: None  Prior Function  Functional Mobility: Independent ( no assistive device)  Receives Help From: Assist from staff at facility  ADL Assistance: Independent  IADL Assistance: Independent  Needs assistance with: Cleaning;Yard work/ gardening (Patient does light, daily cleaning though community staff assist with deep cleaning. Patient does not complete yardwork)  Leisure Activities: has a puppy, plays pickleball, chair volleyball     Pain  Pain Score: 7   Pain Location: Flank  Pain Descriptors: Stabbing  Pain Intervention(s): Ambulation/increased activity;Repositioned  Therapist reported pain to: RN aware and monitoring for needs     Vision  Overall Vision/ Perception: Within Functional Limits  Impairments: Basic Vision  Current Vision: No visual deficits    Cognition  Cognitive  Assessment: Orientation Level;Behavior;Following Commands;Safety Judgment  Orientation Level: Oriented X4  Behavior: Appropriate;Cooperative  Following Commands: Follows all commands and directions without difficulty  Safety Judgment: Good awareness of safety precautions    Neuromuscular     Overall Sensation: Patient denies any numbness/ tingling in BUE's/ BLEs          Right Upper Extremity   Right UE ROM: Grossly WFL as observed during functional activities  Right UE Strength: Grossly WFL (at least 3+/5) as observed during functional activities  Right UE Muscle Tone: Normal  Right Hand Function: Grossly WFL as observed during functional activity         Left Upper Extremity  Left UE ROM: Grossly WFL as observed during functional activities  Left UE Strength: Grossly WFL (at least 3+/5) as observed during functional activities  Left UE Muscle Tone: Normal  Left UE Hand Function: Grossly WFL as observed during functional activites  Functional Mobility  Bed Mobility   Supine to Sit: Supervision;head of bed elevated;towards the right  Transfers  Sit to Stand: Supervision  Stand to Sit: Supervision  Bed to Chair: Supervision  Toilet Transfers: Supervision  Functional Mobility: Supervision (room mobility)  Balance  Sitting - Static: Modified Independent  Sitting-Dynamic: Supervision   Standing-Static: Modified Independent  Standing-Dynamic: Supervision    ADL  Grooming: Supervision  Grooming Deficit: Supervision/safety  Location Assessed Grooming: Standing at sink  Lower Body Dressing: Supervision  Lower Body Dressing Deficit: Don/doff R sock;Don/doff L sock;Supervision/safety  Location Assessed LE Dressing: Seated in chair (figure-4 position)  Toileting: Supervision  Toileting Deficit: Supervison/safety (simulated)  Location Assessed Toileting: Toilet         Position after Treatment and Safety Handoff  Position after therapy session: Bed  Details: RN notified;visitor present;Call light/ needs within  reach  Alarms: Chair  Alarms Status: Activated and Interfaced with call system    Patient Education  Pt educated on role of OT/POC, safety with functional transfers/mobility, safety with ADLs/IADLs, energy conservation/pursed lip breathing, and activity recommendations. Pt demonstrates a good understanding of education provided.     Tentative Discharge Summary  If the patient is discharged prior to the next OT session then this note will serve as the discharge summary.   0 out of 7 goals met.   Goals not met due to brevity of tx  Recommend home with prn assist   Tentative d/c from acute OT.       Time  Start Time: 1213  Stop Time: 1240  Time Calculation (min): 27 min    Charges  $OT Evaluation Mod Complex 45 Min: 1 Procedure    CPT codes  16109- Moderate Complexity Evaluation  This evaluation code was determined based on analysis of pt's occupational profile, performance deficits, and level of clinical decision-making to complete OT evaluation      **Occupational Profile:  Mod Complexity - expanded chart review      **Performance deficits:   Mod Complexity-3-5 performance deficits      **Clinical Decision-making:   Mod Complexity      Co-morbidities affecting pt's current performance: See above for PMH    Current level of physical/ verbal assistance:   Mod physical/verbal assistance    Cephus Slater, OTR/L   License # [id number]  [employer]COM [phone]  11/21/2021

## 2021-11-21 NOTE — Unmapped (Signed)
Reform    Case Manager/Social Worker Discharge Summary     Patient name: Joanna Lopez                                        Patient MRN: [id number]  DOB: 12/02/1950                              Age: 71 y.o.              Gender: female  Patient emergency contact: Extended Emergency Contact Information  Primary Emergency Contact: Straw,Alace   [place]  Home Phone: [phone]  Relation: Daughter  Preferred language: English  Interpreter needed? No  Secondary Emergency Contact: Loa SocksStaudenmaier,David   [place]  Mobile Phone: [phone]  Relation: Son  Preferred language: English  Interpreter needed? No      Attending provider: Janett BillowSandra Starnes, MD  Primary care physician: Trish Fountainhetna Mital, MD    The MD has indicated that the patient is ready for discharge.  Joanna Lopez will discharge home. Family to assist as needed and transport home. No DC needs identified or requested. RN aware of the plans and will finalize discharge.     Transfer Mode/Level of Care: Family    The plan has been reviewed:     Patient/Family Informed of Discharge Plan: Yes    Plan Reviewed With Patient, Family, or Significant Other: Yes    Patient and or family are aware and in agreement with the discharge plan: Yes             Plan reviewed with MD and other members of the health care team: Yes  Care Plan Completed: Yes    No further CM/SW needs.    This plan has been reviewed with the multi-disciplinary team.     Post-Discharge Goals    Patient's Post-Discharge goals: return home    Post Acute Care Provider Information:           Advertising copywriterCommunity Services at Discharge  Community Services at Home post discharge: Not Applicable        Tillman Abideassondra N. Almetta Liddicoat, MSW LSW  [phone]

## 2021-11-21 NOTE — Unmapped (Signed)
[facility]  Case Management/Social Work Department  Progress Note    Patient Information     Hospital day: 3  Inpatient/Observation:  Inpatient   Admit date:  11/18/2021  Admission diagnosis: Lung nodule, solitary [R91.1]    Other Pertinent Information     SW/CM continues to follow. Chart reviewed and patient discussed this AM in ICU Rounds. Thoracic Surgery plans on DC home today. No DC needs identified or requested at this time. Patient's family will be available to assist and will transport home. SW/CM remains available if needs arise and will continue to follow.     Discharge Plan     Anticipated discharge plan:  return home with family assistance. No DC needs identified. Family transport.     Discharge barriers: N/A     Anticipated discharge date:  11/21/2021       CM/SW will continue to follow and remain available until hospital discharge for discharge planning needs.      Tillman Abide, MSW LSW  [phone]

## 2021-11-21 NOTE — Unmapped (Signed)
Pharmacy Meds to [facility]  [place] [facility]    The following prescriptions for outpatient use have been delivered to Sharyn Lull Eddleman's room:  acetaminophen (TYLENOL) 325 MG tablet    gabapentin (NEURONTIN) 300 MG capsule    naloxone (NARCAN) 4 mg/actuation Spry    polyethylene glycol (GLYCOLAX) 17 gram/dose powder    senna-docusate (SENNA-S) 8.6-50 mg per tablet    traMADoL (ULTRAM) 50 mg tablet        Other notes:      Please contact outpatient pharmacy Monday-Friday 0900-1700 at [phone] with any questions or concerns.    Edythe Clarity, RPh  11/21/2021 3:03 PM  [phone]

## 2021-11-21 NOTE — Unmapped (Signed)
Problem: Safety  Goal: Patient will be injury free during hospitalization  Description: Assess and monitor vitals signs, neurological status including level of consciousness and orientation. Assess patient's risk for falls and implement fall prevention plan of care and interventions per hospital policy.      Ensure arm band on, uncluttered walking paths in room, adequate room lighting, call light and overbed table within reach, bed in low position, wheels locked, side rails up per policy, and non-skid footwear provided.   Outcome: Progressing     Problem: Patient will remain free of falls  Goal: Universal Fall Precautions  Outcome: Progressing     Problem: Daily Care  Goal: Daily care needs are met  Description: Assess and monitor ability to perform self care and identify potential discharge needs.  11/21/2021 0522 by Juliet Rude, RN  Outcome: Progressing  11/21/2021 0521 by Juliet Rude, RN  Outcome: Progressing     Problem: Psychosocial Needs  Goal: Demonstrates ability to cope with hospitalization/illness  Description: Assess and monitor patients ability to cope with his/her illness.  11/21/2021 0522 by Juliet Rude, RN  Outcome: Progressing  11/21/2021 0521 by Juliet Rude, RN  Outcome: Progressing  Goal: Collaborate with patient/family to identify patient's goals  Outcome: Progressing     Problem: Discharge Barriers  Goal: Patient's discharge needs are met  Description: Collaborate with interdisciplinary team and initiate plans and interventions as needed.   Outcome: Progressing     Problem: Acute Pain  Description: Patient's pain progressing toward patient's stated pain goal  Goal: Patient displays improved well-being such as baseline levels for pulse, BP, respirations and relaxed muscle tone or body posture  Outcome: Progressing

## 2021-11-21 NOTE — Unmapped (Signed)
DC instructions given to pt and daughter. Voices understanding. IV DC'd. Tele DC'd. Pharmacy phoned. Waiting for scripts to be delivered and will discharge.

## 2021-11-21 NOTE — Unmapped (Signed)
Physical Therapy  Initial Assessment and Tentative Discharge     Name: Joanna Lopez  DOB: 07-23-1950  Attending Physician: Janett Billow, MD  Admission Diagnosis: Lung nodule, solitary [R91.1]  Date: 11/21/2021  Reviewed Pertinent hospital course: Yes  Hospital Course PT/OT: 71 y/o female S/p Left Thoracoscopic Lingulectomy with lymph node dissection s/p 11/18/21. Patient developed double vision overnight, and deemed a recrudence of prior CVA per neurology.  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  Precautions: fall risk, full code  Activity Level: Activity as tolerated    Assessment  PT 6 Clicks  Help From Another Person Turning From Back to Side While Flat in Bed Without Using Siderails: A little  Help From Another Person Moving From Lying On Back To Sitting Without Using Siderails: A little  Help From Another Person Moving To And From Bed To Chair: A little  Help From Another Person Standing Up From Chair Using Your Arms: A little  Help From Another Person To Walk In Hospital Room: A little  Help From Another Person Climbing 3-5 Steps With A Railing: A little  PT 6 Clicks Score: 18  Assessment: Impaired Bed Mobility, Impaired Transfers, Impaired Gait, Impaired Balance, Impaired Strength, Impaired Activity Tolerance, Impaired Stair Negotiation        Prognosis: Excellent    Goals   0/6 goals met    Collaborated with: Patient  Patient Stated Goal: to go home  Goals to be met by: 11/28/21  Patient will transition from supine to sit: Modified Independent, without bedrails  Patient will transition from sit to supine: Modified Independent, without bedrails  Patient will transfer from sit to stand: Modified Independent  Patient will transfer bed/chair: Modified Independent  Patient will ambulate: Modified Independent (x300 ft)  Patient will go up / down stairs: Supervision (1 curb step for community  mobility)     Recommendation  Plan  Treatment/Interventions: LE strengthening/ROM, Cabin crew, Equipment eval/education, Gait training, Neuromuscular Reeducation, Stair Training, Therapeutic Exercise, Therapeutic Activity, Compensatory technique education  PT Frequency: minimum 3x/week    Recommendation  Recommendation: Home with intermittent assistance, Anticipate no further PT needed after discharge    Problem List  Patient Active Problem List   Diagnosis    GERD (gastroesophageal reflux disease)    Lung nodule, solitary    Diarrhea    Lung nodule    Acute respiratory insufficiency    Essential hypertension    Pain management    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)    Cerebrovascular accident (CVA) determined by clinical assessment (CMS-HCC)    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    History of CVA (cerebrovascular accident)      Past Medical History  Past Medical History:   Diagnosis Date    Anxiety     Basal cell cancer     Hyperlipidemia     Hypertension     Lung nodule     OSA (obstructive sleep apnea)     RLS (restless legs syndrome)       Past Surgical History  Past Surgical History:   Procedure Laterality Date    APPENDECTOMY      CATARACT EXTRACTION, BILATERAL Bilateral 07/2021    COLONOSCOPY N/A 01/23/2013    Procedure: COLONOSCOPY WITH MAC;  Surgeon: Lurene Shadow, MD;  Location: [facility] ENDOSCOPY;  Service: Gastroenterology;  Laterality: N/A;  FLEXIBLE BRONCHOSCOPY W/ UPPER ENDOSCOPY N/A 09/17/2016    Procedure: BRONCHOSCOPY SUPER D WITH FLUORO AND BIOPSY-EBUS/NAVI;  Surgeon: Johnny Bridge, MD;  Location: [facility] ENDOSCOPY;  Service: Pulmonary;  Laterality: N/A;    LOOP IMPLANT IMPLANTATION N/A 04/07/2017    Procedure: Loop  Implantation;  Surgeon: Tennis Must, MD;  Location: [facility] ELECTROPHYSIOLOGY LAB;  Service: Electrophysiology;  Laterality: N/A;    LOOP REMOVAL Left 04/09/2021    Procedure: Loop Removal;  Surgeon: Lawerance Cruel, MD;  Location: [facility] MIS SUITE;   Service: Electrophysiology;  Laterality: Left;    submastoid gland removal      THORACOSCOPY Right 11/23/2016    Procedure: RIGHT SIDED VATS MIDDLE LOBE LOBECTOMY,HILAR MEDIASTINAL LYMPH NODE DISSECTION;  Surgeon: Janett Billow, MD;  Location: [facility] OR;  Service: Thoracic;  Laterality: Right;    THORACOSCOPY LOBECTOMY VIDEO ASSISTED Left 11/18/2021    Procedure: Left Thoracoscopic Lingulectomy;  Surgeon: Janett Billow, MD;  Location: [facility] OR;  Service: General;  Laterality: Left;    TONSILLECTOMY      UVULOPALATOPHARYNGOPLASTY       Home Living/Prior Function  Patient able to provide accurate information at this time: Yes  Lives With: Alone  Assistance available: some (not 24 hour) assistance (has emergency pull cords in multiple rooms of home, daughter is available to assist as well)  Type of Home[facility]  Home Entry: No steps to enter  Home Layout: One level  Bathroom Shower/Tub: Pension scheme manager: grab bars around [employer]: Built-in shower seat;Hand-held shower;Grab bars in shower  Home Equipment: None  Prior Function  Functional Mobility: Independent ( no assistive device)  Receives Help From: Assist from staff at facility  ADL Assistance: Independent  IADL Assistance: Independent  Needs assistance with: Cleaning;Yard work/ gardening (Patient does light, daily cleaning though community staff assist with deep cleaning. Patient does not complete yardwork)  Leisure Activities: has a puppy, plays pickleball, chair volleyball     Pain  Pain Score: 7   Pain Location: Flank  Pain Descriptors: Stabbing  Pain Intervention(s): Ambulation/increased activity;Repositioned  Therapist reported pain to: RN aware and monitoring for needs    Vision  Vision/Perception  Overall Vision/ Perception: Within Functional Limits  Impairments: Basic Vision  Current Vision: No visual deficits     Cognition  Cognitive Assessment: Orientation  Level;Behavior;Following Commands;Safety Judgment  Orientation Level: Oriented X4  Behavior: Appropriate;Cooperative  Following Commands: Follows all commands and directions without difficulty  Safety Judgment: Good awareness of safety precautions    Neuromuscular  Overall Sensation: Patient denies any numbness/ tingling in BUE's/ BLEs          Upper Extremity  UE Assessment: Defer to OT evaluation for formal assessment    Lower Extremity  Lower Extremity  LE Assessment: Strength WFL (at least 3+/5) as observed during functional activity     Functional Mobility  Bed Mobility   Supine to Sit: Stand by assistance  Transfers  Sit to Stand: Supervision  Stand to Sit: Supervision  Bed to Chair: Supervision  Chair  to Bed: Supervision  Gait  Distance (in feet): x350 ft total, standing rest breaks x3 required due to shortness of breath and SpO2 drop to 88% on room air  Level of assistance: Supervision  Assistive Device: None  Gait Characteristics: Steady;decreased cadence  Balance  Sitting - Static: Modified Independent  Sitting-Dynamic: Supervision   Standing-Static: Supervision  Standing-Dynamic: Stand by assistance    Position after Therapy and Safety Handoff  Position after treatment and safety handoff  Position after therapy session: Recliner  Details: RN notified;Call light/ needs within reach  Alarms: Chair  Alarms Status: Activated and Interfaced with call system      Patient Education   Role of PT, plan of care and discharge recommendation. Safety and Fall Prevention education with pt participation/demonstration during all bed mobility, transfers, and ambulation. Educated on benefits of PT and it's impact on patient daily functional mobility. Education on correct body mechanics during all transitional movements.  Importance of OOB and upright sitting/standing posture. Pt encouraged to call for assist when wanting to get OOB or up from chair.  Strongly encourage pt to sit up in chair or EOB for all meals to prevent  possible aspiration and maximize lung expansion.    Tentative D/C Summary  Pt has progressed toward the set goals (see goals section for details). Pt may be D/C'd prior to the next treatment. If so this note will serve as the Patient Discharge Summary. Updated goals are above. If the patient remains here at [facility], then the patient will continue to require and benefit from acute PT to meet the set goals safely, and to increase overall mobility.    Goals not met due to:  Prematurity of the time frame set.    Time  Start Time: 1213  Stop Time: 1236  Time Calculation (min): 23 min    Charges   $PT Evaluation Mod Complex 30 Min: 1 Procedure    CPT codes  A. Personal factors and/or Comorbidities / Patient History that impacts plan of care:  See above PMH / PSH / and problem list.      High Complexity: 3 or more      B. An examination of body systems(s) musculoskeletal, neuromuscular, cardiovascular/pumonary, integumentary using standardized tests and measures:  Refer to above examination for details.     High Complexity: 4 or more elements    C. A clinical presentation with:     Moderate Complexity: Evolving with Changing Characteristics    D. Clinical decision making using standardized patient assessment instrument and/or measurable assessment of functional outcome.     Moderate Complexity 97162    Kristopher Glee PT,DPT  License# [id number]  ASCOM [phone]    11/21/2021

## 2021-11-21 NOTE — Unmapped (Signed)
Surgery Progress Note    Patient: Joanna Lopez  Admit Date: 11/18/2021  OR Date: 11/18/2021    SUBJECTIVE     No acute events overnight  Afebrile, HDS, on 3L  CT chest removed       OBJECTIVE   Vitals:  Temp:  [97.6 F (36.4 C)-98.4 F (36.9 C)] 97.6 F (36.4 C)  Heart Rate:  [61-78] 72  Resp:  [13-30] 24  BP: (103-150)/(67-133) 143/85    Date 11/20/21 0700 - 11/21/21 0659 11/21/21 0700 - 11/22/21 0659   Shift 0700-1459 1500-2259 2300-0659 24 Hour Total 0700-1459 1500-2259 2300-0659 24 Hour Total   INTAKE   P.O. 690 240  930         P.O. 690 240  930       I.V.(mL/kg)  0(0)  0(0)         I.V.  0  0       Other  0  0         Other  0  0       Saline Fluid  0  0         Myosure Intake  0  0       Shift Total(mL/kg) 690(8) 240(2.8)  930(10.8)       OUTPUT   Urine(mL/kg/hr) 500(0.7) 250(0.4) 240(0.3) 990(0.5)         Urine 500 250 240 990         Urine Occurrence 1 x 0 x 2 x 3 x       Emesis/NG output  0 0 0         Emesis  0 0 0         Emesis Occurrence  0 x 0 x 0 x       Stool  0 0 0         Stool Occurrence  0 x 0 x 0 x         Stool  0 0 0       Blood  0 0 0         Est Blood Loss  0 0 0         Blood  0 0 0       Chest Tube 20   20         Output (mL) ([REMOVED] Chest Tube 1 Left Pleural 28 Fr.) 20   20       Intrauterine  0 0 0         Myosure Output  0 0 0       Shift Total(mL/kg) 520(6) 250(2.9) 240(2.8) 1010(11.7)       Weight (kg) 86.4 86.4 86.4 86.4 86.4 86.4 86.4 86.4       Physical Exam:  Gen: NAD, A+Ox3  CV: RRR,   Resp: CTAB, no respiratory distress  Abd: Soft, NT/ND  Ext: Warm and well perfused, SCD  GU: Voiding spontaneously   Wound: C/D/I    Labs:  Recent Labs     11/19/21  0644 11/20/21  1013 11/21/21  0302   WBC 14.9* 13.0* 12.8*   HGB 11.8 11.7 11.7   HCT 35.5 35.3 35.7   PLT 160 155 159     Recent Labs     11/19/21  0644 11/20/21  1013 11/21/21  0302   NA 138 136 137   K 4.3 3.9 4.5   CL 105 104 105   CO2 27 25 27  BUN 17 17 18    CREATININE 0.96 0.89 0.92   GLUCOSE 133* 137* 106*    CALCIUM 8.5* 8.0* 8.4*   MG 1.7 2.2 2.0   PHOS 3.9 3.0 2.9     Recent Labs     11/19/21  0655   POCGMD 124*     No results for input(s): AST, ALT, BILITOT, BILIDIRECT, ALKPHOS, ALBUMIN, GGT, AMYLASE, LIPASE in the last 72 hours.  No results for input(s): INR, PROTIME in the last 72 hours.    Current Medications:  Scheduled Meds:   acetaminophen  975 mg Oral Q6H    atorvastatin  80 mg Oral Daily 0900    gabapentin  100 mg Oral Q8H    heparin  5,000 Units Subcutaneous 3 times per day    magnesium oxide  400 mg Oral BID    metoprolol tartrate  5 mg Intravenous Q6H SCH    oxyCODONE  5 mg Oral 4x Daily AC & HS    PARoxetine  20 mg Oral Daily 0900    polyethylene glycol  17 g Oral BID    senna-docusate  2 tablet Oral BID    sod phos di, mono-K phos mono  500 mg Oral Q4H     Continuous Infusions:  PRN Meds: HYDROmorphone **OR** HYDROmorphone, ICU electrolyte replacement protocol **AND** Initiate electrolyte replacement protocol, magnesium hydroxide, ondansetron, traMADol **OR** traMADol    ASSESSMENT/PLAN   Barbette Mcglaun is a 71 y.o. 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    Today's plan:  -- Miralax BID  -- PT/OT  -- Continue pulmonary toilet  -- Possible discharge today vs tomorrow        Pain: Multimodal pain regimen   CV: HDS  PULM: Pulmonary toilet, remove chest tube today  Diet: Regular   GU/RENAL: Voiding spontaneously   HEME: no issues  ID: no issues  ENDOCRINE: no issues  PPX: SQH, SCD,  DISPO: floor    Final plan per attending physician.      Veera Stapleton  11/21/2021

## 2021-11-22 NOTE — Unmapped (Signed)
I called Joanna Lopez to review her pathology.  All questions were answered.    She is doing well, but having some pain. The tramadol works, but doesn't last the 6 hours.  I told her she can take this every 4 hours.

## 2021-11-28 MED ORDER — traMADoL (ULTRAM) 50 mg tablet
50 | ORAL_TABLET | Freq: Four times a day (QID) | ORAL | 0 refills | Status: AC | PRN
Start: 2021-11-28 — End: 2021-12-05

## 2021-11-28 NOTE — Unmapped (Signed)
traMADoL (ULTRAM) 50 mg tablet   Pt is calling because she only has 1 pill left and needs a refill.

## 2021-11-28 NOTE — Unmapped (Signed)
Called patient and informed [facility]. Patient thankful and expressed understanding. No further questions.

## 2021-12-04 ENCOUNTER — Ambulatory Visit: Admit: 2021-12-04 | Discharge: 2021-12-04 | Payer: MEDICARE

## 2021-12-04 DIAGNOSIS — R911 Solitary pulmonary nodule: Secondary | ICD-10-CM

## 2021-12-04 NOTE — Unmapped (Signed)
Subject ID: TBD  Protocol Number: [id number]  Treatment Group: TBD  Protocol Title: Adjuvant Lung Cancer Enrichment Marker Identification and Sequencing Trial (ALCHEMIST)  A screening trial for ALCHEMIST treatment trials  Date of Consent: [id number]    Documentation of Initial Consent    1. Was the use of a Field seismologist (LAR) needed: No  (if yes, pull in '.UCCILAR' smartphrase here)    2. The subject/Legal Authorized Representative (LAR) was given the opportunity to read the Informed Consent Form (ICF) or the Clinical Research Coordinator (CRC) read the ICF to the subject/LAR explaining the details of the protocol. The subject/LAR was given adequate time to ask questions and questions were answered by the [facility], subject MD/Sub-Investigator, or the Principal investigator of the study: Yes    3. The ICF was provided to the subject/LAR in a language the subject/LAR is able to comprehend: Yes  Language: English  Subject comprehension was assessed and deemed adequate by the person obtaining consent: Yes    4. Were Interpreter services utilized: No  (if yes, pull in '.UCCIinterpreterservices' smartphrase here)    5. The ICF was signed prior to any study-related procedures: Yes    6.  Were there additional research selection(s) offered within the MAIN ICF: Yes  If yes, Did the subject consent: Yes  If yes, provide details (Biomarkers, Tissue, Records, Treatment Beyond Progression, etc.):  Optional Banking Study     7. If this is a cooperative group study, was a separate HIPAA consent printed and signed by the patient?: Yes    8. Were there additional consent form(s) offered (i.e. pregnant partner): No   (if yes, pull in '.UCCIADDITIONALCONSENT' smartphrase here)    9. Subject/LAR was provided with a copy of the signed ICF(s) for their records: Yes    10. The subject/LAR was provided with the study team contact information and instructed to call with any questions or concerns: Yes    11. A copy of the ICF(s)  was provided to Health Information Management: Yes    12. Is the Subject fertile: NA  If 'No'-- indicate reason (vasectomy, hysterectomy, menopause, etc.): N/A  Date subject became non-fertile: N/A    13. Regarding Contraceptive Counseling:   Has the subject been educated on protocol-defined adequate contraceptive measures with the physician and coordinator? NA  Has the patient verbalized understanding of contraceptive counseling during the protocol-specified period? NA  List subject's planned contraceptive measures during the protocol-specified period: N/A    Narrative note    Per referral by Dr. Harriette Bouillon, patient was consented to study [id number]. The subject/LAR signed and dated the consent(s) with CRC present after verbalizing understanding of the voluntary nature of the clinical trial.     Version of Main ICF: 01/16/2020    Maria Gallicchio MARIE [employer]

## 2021-12-04 NOTE — Unmapped (Signed)
Subjective   No chief complaint on file.      HPI:   Patient ID: Joanna Lopez is a 71 y.o. female.    HPI:  Joanna Lopez is a 71y/o female who returns for postoperative evaluation following a left VATS lingulectomy for a pT1aN1M0, stage IIB, adenocarcinoma of the lung on 11/18/21. 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 She is doing well and has no complaints. She denies pain or dyspnea.     ECOG Performance Status:Normal activity - asymptomatic       Past Medical History:   Diagnosis Date    Anxiety     Basal cell cancer     Hyperlipidemia     Hypertension     Lung nodule     OSA (obstructive sleep apnea)     RLS (restless legs syndrome)      Past Surgical History:   Procedure Laterality Date    APPENDECTOMY      CATARACT EXTRACTION, BILATERAL Bilateral 07/2021    COLONOSCOPY N/A 01/23/2013    Procedure: COLONOSCOPY WITH MAC;  Surgeon: Lurene Shadow, MD;  Location: [facility] ENDOSCOPY;  Service: Gastroenterology;  Laterality: N/A;    FLEXIBLE BRONCHOSCOPY W/ UPPER ENDOSCOPY N/A 09/17/2016    Procedure: BRONCHOSCOPY SUPER D WITH FLUORO AND BIOPSY-EBUS/NAVI;  Surgeon: Johnny Bridge, MD;  Location: [facility] ENDOSCOPY;  Service: Pulmonary;  Laterality: N/A;    LOOP IMPLANT IMPLANTATION N/A 04/07/2017    Procedure: Loop  Implantation;  Surgeon: Tennis Must, MD;  Location: [facility] ELECTROPHYSIOLOGY LAB;  Service: Electrophysiology;  Laterality: N/A;    LOOP REMOVAL Left 04/09/2021    Procedure: Loop Removal;  Surgeon: Lawerance Cruel, MD;  Location: [facility] MIS SUITE;  Service: Electrophysiology;  Laterality: Left;    submastoid gland removal      THORACOSCOPY Right 11/23/2016    Procedure: RIGHT SIDED VATS MIDDLE LOBE LOBECTOMY,HILAR MEDIASTINAL LYMPH NODE DISSECTION;  Surgeon: Janett Billow, MD;  Location: [facility] OR;  Service: Thoracic;  Laterality: Right;    THORACOSCOPY LOBECTOMY VIDEO ASSISTED Left 11/18/2021     Procedure: Left Thoracoscopic Lingulectomy;  Surgeon: Janett Billow, MD;  Location: [facility] OR;  Service: General;  Laterality: Left;    TONSILLECTOMY      UVULOPALATOPHARYNGOPLASTY       Family History   Adopted: Yes   Problem Relation Age of Onset    Diabetes Son      Social History     Tobacco Use    Smoking status: Never    Smokeless tobacco: Never   Substance Use Topics    Alcohol use: Yes     Alcohol/week: 2.0 standard drinks     Types: 2 Glasses of wine per week     Comment: occasionally     Allergies   Allergen Reactions    Penicillins Anaphylaxis     Other reaction(s): Facial Swelling  Has patient had a PCN reaction causing immediate rash, facial/tongue/throat swelling, SOB or lightheadedness with hypotension: Yes  Has patient had a PCN reaction causing severe rash involving mucus membranes or skin necrosis: Yes  Has patient had a PCN reaction that required hospitalization: No  Has patient had a PCN reaction occurring within the last 10 years: No  If all of the above answers are NO, then may proceed with Cephalosporin use.    Adhesive      Other reaction(s): breaks out and itches if on for long time    Formaldehyde  Analogues     Nsaids (Non-Steroidal Anti-Inflammatory Drug)      Severe GI issues    Promethazine Hcl      Restless legs    Latex Rash     States she has a sensitivity to latex after being around it for days, she gets a rash: she states that when she wore her cpap machine for 14 days she developed a rash on her face other than this she has no problems stating her dentist wears latex gloves and she has no problems with latex gloves.     Sulfa (Sulfonamide Antibiotics) Rash     Current Outpatient Medications   Medication Instructions    acetaminophen (TYLENOL) 975 mg, Oral, Every 8 hours    aspirin-acetaminophen-caffeine (EXCEDRIN) 250-250-65 mg per tablet 1 tablet, Oral, Every 6 hours PRN    aspirin 325 mg, Oral, Daily    atorvastatin (LIPITOR) 80 mg, Oral, Daily    diclofenac (VOLTAREN) 0.1 %  ophthalmic solution 1 drop, As needed, ears    gabapentin (NEURONTIN) 300 MG capsule Take 1 capsule (300 mg total) by mouth 3 times a day for 7 days, THEN 1 capsule (300 mg total) in the morning and at bedtime for 7 days, THEN 1 capsule (300 mg total) daily for 7 days.    lipase/protease/amylase (CREON ORAL) 1 capsule, Oral, As needed    lisinopriL (PRINIVIL) 10 mg, Oral, Daily, 10 mg (half tablet) daily     MELATONIN ORAL 3-5 mg, Oral, At Bedtime (2100), PRN for sleep     multivitamin (THERAGRAN) tablet 1 tablet, Oral, Daily    naloxone (NARCAN) 4 mg/actuation Spry 1 spray, Nostril, As needed, Call 911. May repeat dose in other nostril if no response in 3 minutes.    PARoxetine (PAXIL) 20 MG tablet 1 tablet, Oral, Daily    polyethylene glycol (GLYCOLAX) 17 g, Oral, Daily    roPINIRole (REQUIP) 0.5 mg, Oral, Daily    senna-docusate (SENNA-S) 8.6-50 mg per tablet 2 tablets, Oral, Daily    traMADoL (ULTRAM) 50 mg, Oral, Every 6 hours PRN      Cancer Staging:    Cancer Staging   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)  Staging form: Lung, AJCC 8th Edition  - Clinical: No stage assigned - Unsigned  - Pathologic stage from 12/11/2016: Stage IA2 (pT1b, pN0, cM0) - Signed by Janett Billow, MD on 12/11/2016      ROS:   Review of Systems   Constitutional:  Positive for fatigue. Negative for chills and fever.   Respiratory:  Positive for shortness of breath. Negative for cough and wheezing.    Cardiovascular:  Negative for chest pain, palpitations and leg swelling.   Gastrointestinal:  Negative for constipation, diarrhea, nausea and vomiting.   Neurological:  Positive for tremors. Negative for dizziness, syncope, weakness, light-headedness and headaches.   Hematological:  Does not bruise/bleed easily.   Psychiatric/Behavioral:  Negative for depression. The patient is not nervous/anxious.    I have reviewed the ROS and agree with the findings documented above.     BP 138/74   Pulse 93   Ht 5' 6 (1.676 m)  Comment: verbal per pt  Wt 187 lb (84.8 kg)   SpO2 96%   BMI 30.18 kg/m     Objective:   Physical Exam  Constitutional:       General: She is not in acute distress.  Pulmonary:      Effort: Pulmonary effort is normal. No respiratory distress.  Breath sounds: No wheezing.      Comments: Incisions healing without signs of infection      Skin:     General: Skin is warm.      Coloration: Skin is not jaundiced.   Neurological:      General: No focal deficit present.      Mental Status: She is alert and oriented to person, place, and time.   Psychiatric:         Mood and Affect: Mood normal.         Behavior: Behavior normal.          Imaging:   None recent    PFT:     FEV1   Date Value Ref Range Status   11/06/2021 2.67     10/24/2018 2.58  Final     FEV1%   Date Value Ref Range Status   11/06/2021 114     10/24/2018 102  Final     FVC   Date Value Ref Range Status   11/06/2021 3.41     10/24/2018 3.33  Final     FVC%   Date Value Ref Range Status   11/06/2021 112     10/24/2018 100  Final     FEV1/FVC   Date Value Ref Range Status   11/06/2021 78     10/24/2018 77  Final     FEV1/FVC EXP   Date Value Ref Range Status   11/06/2021 64     10/24/2018 76  Final     RESPONSE TO BRONCHODILATOR   Date Value Ref Range Status   11/06/2021 2% FVC, 3% FEV1     10/24/2018 % FVC, % FEV1  Final     TLC   Date Value Ref Range Status   11/06/2021 5.32     10/24/2018 5.00  Final     TLC%   Date Value Ref Range Status   11/06/2021 99     10/24/2018 92  Final     RV   Date Value Ref Range Status   11/06/2021 2.02     10/24/2018 1.81  Final     RV%   Date Value Ref Range Status   11/06/2021 95     10/24/2018 85  Final     VC   Date Value Ref Range Status   11/06/2021 3.30     10/24/2018 3.20  Final     VC%   Date Value Ref Range Status   11/06/2021 102     10/24/2018 96  Final     DLCO   Date Value Ref Range Status   11/06/2021 16.86     10/24/2018 18.22  Final     DLCO%   Date Value Ref Range Status   11/06/2021 84     10/24/2018  77  Final       Pathology:   Left thoracoscopic lingulectomy 11/18/21    A.   Lymph nodes, level 11, excision:   - Metastatic carcinoma (0.7 cm), involving one of six lymph nodes (1/6).   - No evidence of extranodal extension.     B.   Lung, left, lingulectomy:   - Invasive poorly differentiated adenocarcinoma, acinar predominant,   measuring 1.0 cm. See comment and case summary.   - All margins are negative for carcinoma.   - Lymph-vascular space invasion and spread through airspaces (STAS) are   present.   - No evidence of visceral pleural invasion.   - Background lung parenchyma with  chronic bronchiolitis.   - Three hilar lymph nodes, negative for carcinoma (0/3).   - Pathologic Stage (8th Edition AJCC): pT1aN1.     C.   Lymph nodes, level 5, excision:   - Two lymph nodes, negative for carcinoma (0/2).     Comment: The tumor cells are positive for TTF-1 and Napsin-A supporting a   diagnosis of pulmonary adenocarcinoma.  Prospective intradepartmental   consultation is obtained.     PD-L1 (sp263) immunostain and EGFR molecular testing are pending and will   be resulted in an addendum.     CASE SUMMARY: (LUNG)   Standard(s): AJCC-UICC 8     SPECIMEN     Synchronous Tumors   Not applicable     Procedure   Other (specify): Lingulectomy     Specimen Laterality   Left     TUMOR     Tumor Focality   Single focus     Tumor Site   Other (specify): Lingula     Tumor Size   Total Tumor Size   Greatest dimension in Centimeters (cm): 1.0 cm   Size of Invasive Component    Greatest dimension in Centimeters (cm): 1.0 cm     Histologic Type   Invasive acinar adenocarcinoma   Histologic Patterns Present   Acinar: 60%   Papillary: 15%   Micropapillary: 20%   Solid/complex glands: 5%     Histologic Grade   G3, poorly differentiated     Spread Through Retail banker (STAS)   Present     Visceral Pleura Invasion   Not identified     Direct Invasion of Adjacent Structures   Not applicable (no adjacent structures present)     Treatment  Effect   No known presurgical therapy     Lymphovascular Invasion   Lymphatic invasion present     MARGINS   Margin Status for Invasive Carcinoma   All margins negative for invasive carcinoma   Closest Margin(s) to Invasive Carcinoma   Bronchial   Vascular   Parenchymal   Distance from Invasive Carcinoma to Closest Margin   Exact distance: 2.0 cm   Margin Status for Non-Invasive Tumor  (select all that apply)   All margins negative for non-invasive tumor     REGIONAL LYMPH NODES     Lymph Node(s) from Prior Procedures   No known prior lymph node sampling performed     Regional Lymph Node Status   Regional lymph nodes present   Tumor present in regional lymph node(s)   Number of Lymph Nodes with Tumor   Exact number (specify): 1   Nodal Site(s) with Tumor   11L: Interlobar   Extranodal Extension   Not identified   Number of Lymph Nodes Examined   Exact number (specify): 11   Nodal Site(s) Examined   5: Subaortic / aortopulmonary (AP) / AP window   7: Hilar   11L: Interlobar     DISTANT METASTASIS     Distant Site(s) Involved, if applicable   Not applicable     PATHOLOGIC STAGE CLASSIFICATION (pTNM, AJCC 8th Edition)   Reporting of pT, pN, and (when applicable) pM categories is based on   information available to the pathologist at the time the report is issued.   As per the AJCC (Chapter 1, 8th Ed.) it is the managing physician's   responsibility to establish the final pathologic stage based upon all   pertinent information, including but potentially not limited to this   pathology report.  TNM Descriptors   Not applicable     pT Category   pT1a: Tumor less than or equal to 1 cm in greatest dimension. A   superficial, spreading tumor of any size whose invasive component is   limited to the bronchial wall and may extend proximal to the main bronchus   also is classified as T1a, but these tumors are uncommon     pN Category   pN1: Metastasis in ipsilateral peribronchial and / or ipsilateral hilar   lymph nodes, and  intrapulmonary nodes including involvement by direct   extension     pM Category   Not applicable - pM cannot be determined from the submitted specimen(s)     EGFR GENOTYPE RESULTS ON [id number],  BLOCK B3. ADDENDUM:       L858R:              No Mutation Detected   Exon 19 Deletion:   No Mutation Detected   T790M:              No Mutation Detected   [id number]:            No Mutation Detected   L861Q:              No Mutation Detected   Exon 20 Insertion:  No Mutation Detected   S768I:              No Mutation Detected   ADDENDUM:     Histology Analysis   PD-L1 22C3 FDA for NSCLC     PD-L1 22C3 FDA for   NSCLC: HIGH PD-L1   EXPRESSION   Tumor Proportion Score: 95%   Intensity: 1+     Right VATS middle lobectomy 11/23/16    A.   Lymph node, level 10, excision:       - One benign lymph node with reactive change (0/1).       - Negative for atypia or malignancy.    B.   Lymph node, level 11, excision:       - Two benign lymph nodes with reactive change (0/2).       - Negative for atypia or malignancy.    C.   Lung, right middle lobe, lobectomy:  - Well differentiated neuroendocrine tumor, Grade 1 (Typical carcinoid  tumor; 2 cm greatest dimension).  -Pathologic tumor stage:  pT1b, pN0, pMX.  - Ki-67 index is 2%; mitosis is less than 2 per two millimeter square.  - No evidence of necrosis.            - Margin of resection is free of tumor.            - Please see Synoptic Report.       - Background lung parenchyma shows emphysematous change.       - Six benign lymph nodes with reactive change (0/6).    D.   Lymph node, level 7, excision:       - Two benign lymph nodes with reactive change (0/2).       - Negative for metastatic disease.    E.   Lymph node, level 4R, excision:       - One benign lymph node with reactive change(0/1).       - Negative for metastatic disease.      SYNOPTIC REPORT  Procedure  Lobectomy  Specimen Laterality  Right  Tumor Site  Right middle lobe  Tumor Size  Greatest dimension (centimeters):  2 x  1.5 x 1 cm  Tumor Focality  Single focus  Histologic Type  Typical carcinoid tumor  Histologic Grade  G1: Well differentiated  Spread Through Air Spaces (STAS)  Not identified  Visceral Pleura Invasion  Not identified  Lymphovascular Invasion  Not identified  Direct Invasion of Adjacent Structures  No adjacent structures present  Margins  All margins are uninvolved by tumor       Margins examined (specify):  Bronchial       Distance of invasive carcinoma from closest margin (centimeters):  1.3  cm  Treatment Effect  No known presurgical therapy  Regional Lymph Nodes  Lymph Node Examination  Number of Lymph Nodes Involved:  0  Number of Lymph Nodes Examined:  12 as follows:  Level 10 - 0/1, level 11 -  0/2, level 7 - 0/2, level 4R - 0/1 and peribronchial - 0/6.  Pathologic Stage Classification (pTNM, AJCC 8th Edition)  Primary Tumor (pT)  pT1b:          Tumor >1 cm, but less than or equal to 2 cm in greatest  dimension  Regional Lymph Nodes (pN)  pN0: No regional lymph node metastasis  Distant Metastasis (pM)  pMX  Additional Pathologic Findings  Emphysematous change  Ancillary Studies  Tumor cells are positive for synaptophysin and chromogranin and show a  Ki-67 index is 2%, findings support typical carcinoid tumor.  There is no  evidence of necrosis or increased mitosis or atypia.    Comment(s):  Patient's history of enlarging right middle lobe nodule is  noted. The sections show well ciecumscribed neuroendocrine tumor  (synaptophysin and chromogranin positive ) with less than 2 mitosis per 2  millimeter square, ki-67 index of 2% and does not show areas of necrosis.  The histologic and immunphenotypic features are of typical carcinoid tumor  (Grade1).     Treatment:   Left thoracoscopic lingulectomy 11/18/21  Right VATS middle lobectomy 11/23/16      Assessment/Plan:   Ms. Roig returns for postoperative evaluation following a left VATS lingulectomy for a pT1aN1M0, stage IIB, adenocarcinoma of the lung on  11/18/21. 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 She is doing very well. I have reviewed her pathology with her and her daughter. Given the N1 disease, I recommended adjuvant chemotherapy and she is agreeable. I have referred her to thoracic oncology. She is also interested in participating in the ALCHEMIST screening trial for ALK rearrangement and will sign consent today.  She will continue surveillance with oncology and I would be happy to see her anytime in the future should the need arise.     Cc:  [name], MD - PCP  Rhea Bleacher, MD - pulmonary

## 2021-12-04 NOTE — Unmapped (Signed)
Subject ID: TBD  Protocol Number: [id number]  Treatment Group: NA  Study Visit: Screening  Treating MD: Dr. Harriette Bouillon    The below score best fits the patient as reported by patient on the day of the visit.     ECOG Score:   0

## 2021-12-09 NOTE — Unmapped (Signed)
Pt needs a letter mailed to her saying she had surgery with the date on it for an airline. Her daughter was there with her and it needs to say that. Her name is Joanna Lopez.

## 2021-12-09 NOTE — Unmapped (Signed)
The patient called me while I was away from my desk, so she left me a message. In the message, she stated that she would like to move her new patient consult with Dr. Zerita Boers from [facility] to [facility]. I called the patient back to inform her that if we moved the appt to [facility], it wouldn't be until 8/25. She stated that she would keep her appt as is at [facility]. I did discuss with her that she can request to have her follow up(s) and/or treatment (if she gets it) done at [facility] since it's closer for her.

## 2021-12-18 ENCOUNTER — Ambulatory Visit: Admit: 2021-12-18 | Payer: MEDICARE

## 2021-12-18 ENCOUNTER — Ambulatory Visit: Admit: 2021-12-18 | Discharge: 2021-12-18 | Payer: MEDICARE

## 2021-12-18 DIAGNOSIS — C3412 Malignant neoplasm of upper lobe, left bronchus or lung: Secondary | ICD-10-CM

## 2021-12-18 MED ORDER — proCHLORPERazine (COMPAZINE) 10 MG tablet
10 | ORAL_TABLET | Freq: Four times a day (QID) | ORAL | 3 refills | Status: AC | PRN
Start: 2021-12-18 — End: ?

## 2021-12-18 MED ORDER — cyanocobalamin (VITAMIN B-12) injection 1,000 mcg
1000 | Freq: Once | INTRAMUSCULAR | Status: AC
Start: 2021-12-18 — End: 2021-12-18
  Administered 2021-12-18: 14:00:00 1000 ug via INTRAMUSCULAR

## 2021-12-18 MED ORDER — folic acid (FOLVITE) 1 MG tablet
1 | ORAL_TABLET | Freq: Every day | ORAL | 3 refills
Start: 2021-12-18 — End: 2021-12-22

## 2021-12-18 MED ORDER — lidocaine-prilocaine (EMLA) cream
2.5-2.5 | TOPICAL | 3 refills | Status: AC | PRN
Start: 2021-12-18 — End: ?

## 2021-12-18 MED ORDER — dexAMETHasone (DECADRON) 4 MG tablet
4 | ORAL_TABLET | ORAL | 3 refills | 10.00000 days | Status: AC
Start: 2021-12-18 — End: ?

## 2021-12-18 MED FILL — CYANOCOBALAMIN (VIT B-12) 1,000 MCG/ML INJECTION SOLUTION: 1000 1,000 mcg/mL | INTRAMUSCULAR | Qty: 1

## 2021-12-18 NOTE — Unmapped (Signed)
8/10  Received hand off from Liz Claib[facility]Jenn [facility]    DIAGNOSIS:   NSCLC/adenocarcinoma of the Left lung lingula   11/18/21 left VATS lingulectomy    PLAN:   Cisplatin/Alimta every 3 weeks x4 cycles    NAVIGATION FOLLOW UP:  Barriers assessment needed   Auth pending  Audio referral  Port referral  Follow up with Dr Zerita BoersLemmon with cycle 2     Future Appointments   Date Time Provider Department Center   04/16/2022 11:30 AM Joanna Qual[facility]iel M [facility]ase[facility]

## 2021-12-18 NOTE — Unmapped (Signed)
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  DATE OF DIAGNOSIS: 11/18/21  STAGE: Cancer Staging   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)  Staging form: Lung, AJCC 8th Edition  - Clinical: No stage assigned - Unsigned  - Pathologic stage from 12/11/2016: Stage IA2 (pT1b, pN0, cM0) - Signed by Janett Billow, MD on 12/11/2016    Malignant neoplasm of upper lobe of left lung (CMS-HCC)  Staging form: Lung, AJCC 8th Edition  - Pathologic stage from 12/04/2021: Stage IIB (pT1a, pN1, cM0) - Signed by Janett Billow, MD on 12/04/2021      GENOMICS:  EGFR mt negative  PD-L1 95%  Other genomics pending    SPECIAL MEDICAL ISSUES: 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 11/18/21: Left VATS lingulectomy     ECOG PERFORMANCE STATUS: (1) Restricted in physically strenuous activity but ambulatory and able to carry out work of a light or sedentary nature, e.g., light house work, office work       Consultation requested by Dr. Harriette Bouillon for an opinion regarding [name], and my final recommendations will be communicated back to the requesting physician by way of shared medical record or letter via [place] mail.    HPI: [name] presents to the [facility] today at the request of Dr. Harriette Bouillon for thoracic oncology evaluation. She is a 71 y.o. female who was initially diagnosed with a stage I typical carcinoid in 2018 after which she underwent resection of the RML. In surveillance for her carcinoid she underwent CT imaging showing a slowly growing lingular nodule. She was asymptomatic.  She is very active and was taking trips with friends and playing pickleball. She is a never smoker. She is now still having a little difficulty  with breathing and activity after the surgery but is still able to do most of her normal activities. No other complaints today        PAST MEDICAL HISTORY:  She has a past medical history of Anxiety, Basal cell cancer, Hyperlipidemia, Hypertension, Lung nodule, OSA (obstructive sleep apnea), and RLS (restless legs syndrome).    PAST SURGICAL HISTORY:  She has a past surgical history that includes Appendectomy; submastoid gland removal; Uvulopalatopharyngoplasty; Tonsillectomy; Colonoscopy (N/A, 01/23/2013); Flexible bronchoscopy w/ upper endoscopy (N/A, 09/17/2016); Thoracoscopy (Right, 11/23/2016); Loop Implant  Implantation (N/A, 04/07/2017); Loop Removal (Left, 04/09/2021); Cataract extraction, bilateral (Bilateral, 07/2021); and thoracoscopy lobectomy video assisted (Left, 11/18/2021).    MEDICATIONS:  Current Outpatient Medications   Medication Sig    aspirin Take 1 tablet (325 mg total) by mouth daily.    aspirin-acetaminophen-caffeine Take 1 tablet by mouth every 6 hours as needed for Pain.    atorvastatin Take 1 tablet (80 mg total) by mouth daily.    diclofenac 1 drop if needed (ear itching). ears    lipase/protease/amylase (CREON ORAL) Take 1 capsule by mouth if needed (stomach pain).    lisinopriL Take 1 tablet (10 mg total) by mouth daily. 10 mg (half tablet) daily    MELATONIN ORAL Take 3-5 mg by mouth at bedtime. PRN for sleep    multivitamin Take 1 tablet by mouth daily.    naloxone  Apply 1 spray in one nostril if needed. Call 911. May repeat dose in other nostril if no response in 3 minutes.    PARoxetine Take 1 tablet (20 mg total) by mouth daily.    roPINIRole Take 1 tablet (0.5 mg total) by mouth daily.     No current facility-administered medications for this visit.       ALLERGIES:    Penicillins, Adhesive, Formaldehyde analogues, Nsaids (non-steroidal anti-inflammatory drug), Promethazine hcl, Latex, and Sulfa (sulfonamide antibiotics)    FAMILY HISTORY:   Her family history includes Diabetes in  her son. She was adopted.    SOCIAL HISTORY:    She reports that she has never smoked. She has never used smokeless tobacco. She reports current alcohol use of about 2.0 standard drinks per week. She reports that she does not use drugs.     REVIEW OF SYSTEMS:  Review of Systems   Constitutional:  Positive for malaise/fatigue. Negative for chills, fever and weight loss.   HENT:  Negative for congestion, hearing loss, sore throat and tinnitus.    Eyes:  Negative for blurred vision and double vision.   Respiratory:  Positive for shortness of breath. Negative for cough.    Cardiovascular:  Negative for chest pain, palpitations and leg swelling.   Gastrointestinal:  Negative for abdominal pain, diarrhea, nausea and vomiting.   Genitourinary:  Negative for dysuria and urgency.   Musculoskeletal:  Negative for myalgias and neck pain.   Skin:  Negative for rash.   Neurological:  Negative for dizziness, weakness and headaches.   Psychiatric/Behavioral:  Negative for depression. The patient is not nervous/anxious.    All other systems reviewed and are negative.    PHYSICAL EXAMINATION:  There were no vitals filed for this visit. There is no height or weight on file to calculate BSA.  Physical Exam  Constitutional:       General: She is not in acute distress.     Appearance: Normal appearance. She is normal weight. She is not ill-appearing.   HENT:      Head: Normocephalic and atraumatic.      Nose: Nose normal.      Mouth/Throat:      Mouth: Mucous membranes are moist.      Pharynx: Oropharynx is clear.   Eyes:      General: No scleral icterus.     Extraocular Movements: Extraocular movements intact.   Cardiovascular:      Rate and Rhythm: Normal rate.      Pulses: Normal pulses.   Pulmonary:      Effort: Pulmonary effort is normal. No respiratory distress.   Abdominal:      General: Abdomen is flat. There is no distension.   Musculoskeletal:         General: No swelling or tenderness. Normal range of motion.      Cervical  back: Normal range of motion.   Skin:     General: Skin is warm and dry.      Coloration: Skin is not jaundiced.      Findings: No rash.   Neurological:      General: No focal deficit present.      Mental Status: She is alert and oriented to person, place, and time. Mental status is at baseline.      Cranial Nerves: No cranial nerve deficit.      Gait: Gait normal.   Psychiatric:         Mood and Affect: Mood normal.  Behavior: Behavior normal.         Thought Content: Thought content normal.        LABORATORY DATA:  Lab Results   Component Value Date    WBC 12.8 (H) 11/21/2021    RBC 3.83 11/21/2021    HGB 11.7 11/21/2021    HCT 35.7 11/21/2021    MCV 93.2 11/21/2021    MCH 30.5 11/21/2021    MCHC 32.7 11/21/2021    RDW 14.3 11/21/2021    PLT 159 11/21/2021      Lab Results   Component Value Date    GLUCOSE 106 (H) 11/21/2021    BUN 18 11/21/2021    CO2 27 11/21/2021    CREATININE 0.92 11/21/2021    K 4.5 11/21/2021    NA 137 11/21/2021    CL 105 11/21/2021    CALCIUM 8.4 (L) 11/21/2021    ALBUMIN 3.4 (L) 02/22/2017    PROT 7.3 12/29/2016    ALKPHOS 69 12/29/2016    ALT 14 12/29/2016    AST 11 (L) 12/29/2016    BILITOT 0.8 12/29/2016     No results found for: TSH, T3TOTAL, T4TOTAL, T3FREE, FREET4, THYROIDAB     RADIOLOGY REVIEW:  Imaging results personally reviewed and compared to priors  No results found.    PATHOLOGY REVIEW:  11/18/21   FINAL DIAGNOSIS:     A.   Lymph nodes, level 11, excision:   - Metastatic carcinoma (0.7 cm), involving one of six lymph nodes (1/6).   - No evidence of extranodal extension.     B.   Lung, left, lingulectomy:   - Invasive poorly differentiated adenocarcinoma, acinar predominant,   measuring 1.0 cm. See comment and case summary.   - All margins are negative for carcinoma.   - Lymph-vascular space invasion and spread through airspaces (STAS) are   present.   - No evidence of visceral pleural invasion.   - Background lung parenchyma with chronic bronchiolitis.   - Three hilar  lymph nodes, negative for carcinoma (0/3).   - Pathologic Stage (8th Edition AJCC): pT1aN1.     C.   Lymph nodes, level 5, excision:   - Two lymph nodes, negative for carcinoma (0/2).     Comment: The tumor cells are positive for TTF-1 and Napsin-A supporting a   diagnosis of pulmonary adenocarcinoma.  Prospective intradepartmental   consultation is obtained.     PD-L1 (sp263) immunostain and EGFR molecular testing are pending and will   be resulted in an addendum.     CASE SUMMARY: (LUNG)   Standard(s): AJCC-UICC 8     SPECIMEN     Synchronous Tumors   Not applicable     Procedure   Other (specify): Lingulectomy     Specimen Laterality   Left     TUMOR     Tumor Focality   Single focus     Tumor Site   Other (specify): Lingula     Tumor Size   Total Tumor Size   Greatest dimension in Centimeters (cm): 1.0 cm   Size of Invasive Component    Greatest dimension in Centimeters (cm): 1.0 cm     Histologic Type   Invasive acinar adenocarcinoma   Histologic Patterns Present   Acinar: 60%   Papillary: 15%   Micropapillary: 20%   Solid/complex glands: 5%     Histologic Grade   G3, poorly differentiated     Spread Through Air Spaces (STAS)   Present  Visceral Pleura Invasion   Not identified     Direct Invasion of Adjacent Structures   Not applicable (no adjacent structures present)     Treatment Effect   No known presurgical therapy     Lymphovascular Invasion   Lymphatic invasion present     MARGINS   Margin Status for Invasive Carcinoma   All margins negative for invasive carcinoma   Closest Margin(s) to Invasive Carcinoma   Bronchial   Vascular   Parenchymal   Distance from Invasive Carcinoma to Closest Margin   Exact distance: 2.0 cm   Margin Status for Non-Invasive Tumor  (select all that apply)   All margins negative for non-invasive tumor     REGIONAL LYMPH NODES     Lymph Node(s) from Prior Procedures   No known prior lymph node sampling performed     Regional Lymph Node Status   Regional lymph nodes present    Tumor present in regional lymph node(s)   Number of Lymph Nodes with Tumor   Exact number (specify): 1   Nodal Site(s) with Tumor   11L: Interlobar   Extranodal Extension   Not identified   Number of Lymph Nodes Examined   Exact number (specify): 11   Nodal Site(s) Examined   5: Subaortic / aortopulmonary (AP) / AP window   7: Hilar   11L: Interlobar     EGFR GENOTYPE RESULTS ON [id number],  BLOCK B3. 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 CHRYSTLE MURILLO and her family. We reviewed the relevant imaging and pathology results and she has since had a definitive resection with upstaging of her cancer based on LN positivity.   - Reviewed the general recommendation for adjuvant treatment given her stage and this would consist of 4 cycles of cisplatin pemetrexed chemotherapy. Reviewed the general schedule, risks, benefits, and AEs with the patient and she signed informed consent today. They wish to have this treatment at [facility] as it would be very difficult to travel downtown so will try to arrange this.   - Discussed the potential use of adjuvant immunotherapy for 1 year after chemotherapy, pending genomic analysis.  - She will need a port placed for chemotherapy. Prescribed emla cream.  - Prescribed folic acid, vitamin B12 injections every 9 weeks, and steroids around treatment. To get first B12 injection today.  - Discussed the need for genomic testing given the fact that she is a never smoker  and the high likelihood of finding a oncogene driver. [facility] NGS testing sent to  guide treatment, specifically regarding the use of adjuvant immunotherapy due to decreased efficacy of immunotherapy in oncogene driven lung cancers and the potential increased toxicity if targeted therapies are needed in sequence.   - Will continue surveillance with CT imaging  - She will return for her first treatment and then return for visit prior to her second treatment.      She and her family had all of their questions answered and were given my contact information should further concerns arise.      Hildred Priest, MD  Thoracic and Head & Neck Oncology  [facility]    CC:  Janett Billow, MD  [address]  Cardiac Surgery  [place],  [place] [address]        - Total time I spent for E/M services on the date of the encounter: 62 minutes

## 2021-12-18 NOTE — Unmapped (Signed)
Pt here for B12 injection. Injection given in left deltoid. Pt tolerated injection. AVS containing next appt printed, given to pt and reviewed with pt. Pt left unit ambulatory.

## 2021-12-18 NOTE — Unmapped (Signed)
[facility]  [facility]    [facility] calling to get a frequency dose on Emla Cream. This RN reached out to Kin, NP to confirm that applying cream one hour prior to port access is fine. Selena Batten, NP confirmed. Gave verbal to [employer] No further questions at this time.

## 2021-12-18 NOTE — Unmapped (Addendum)
Attempted to reach patient to discuss port placement. Left voicemail to return call.

## 2021-12-19 MED ORDER — lidocaine (PF) 2% (20 mg/mL) Soln 20 mg
20 | Freq: Once | INTRAMUSCULAR | Status: AC | PRN
Start: 2021-12-19 — End: 2021-12-19

## 2021-12-19 NOTE — Unmapped (Signed)
Addended by: Penni Bombard on: 12/19/2021 11:10 AM     Modules accepted: Orders

## 2021-12-19 NOTE — Unmapped (Signed)
Spoke to patient,confirmed that Dr. Hughie Closs will place her port on 8/18 arriving at [facility] at 8:30am for 1030am procedure. She voiced understanding regarding nothing to eat/drink after midnight the night prior to the surgery, She will need transportation to and from the hospital the day of surgery, the patient confirmed that this is an out patient procedure and that She will go home that day once recovery is complete, and finally that She should expect a call from the hospital surgical staff to update her medical history prior to the surgery.  She  will call with any questions or concerns.    Patient instructed to call [facility] anesthesia with any questions: [phone]         [facility]: [address] [place], [place] [address]

## 2021-12-19 NOTE — Unmapped (Signed)
Left another voicemail with patient and her daughter for informing her of scheduled port placement on 8/18. Informed patient to return call for more information.

## 2021-12-19 NOTE — Unmapped (Signed)
Pt completed Malnutrition Screening Tool (MST) at previous OV and scored in At Risk category.     Ambulatory Nutrition Screening      12/18/2021     8:00 AM   AMBULATORY NUTRITION SCREENING   Have you lost weight recently without trying?  If yes, how much weight have you lost? 1   Have you been eating poorly because of a decreased appetite?  1   Total Score (2 or more indicates risk of malnutrition) 2        Ht Readings from Last 1 Encounters:   12/18/21 5' 6.22 (1.682 m)     Wt Readings from Last 10 Encounters:   12/18/21 188 lb 3.2 oz (85.4 kg)   12/04/21 187 lb (84.8 kg)   11/19/21 190 lb 7.6 oz (86.4 kg)   11/10/21 192 lb 11.2 oz (87.4 kg)   10/02/21 193 lb 9.6 oz (87.8 kg)   09/23/21 190 lb 12.8 oz (86.5 kg)   04/29/21 194 lb (88 kg)   04/09/21 187 lb 12.2 oz (85.2 kg)   03/25/21 191 lb 8 oz (86.9 kg)   10/28/20 194 lb (88 kg)      Estimated body mass index is 30.17 kg/m as calculated from the following:    Height as of 12/18/21: 5' 6.22 (1.682 m).    Weight as of 12/18/21: 188 lb 3.2 oz (85.4 kg).     oncnutmst: LVM for pt/caregiver asking for call back to schedule nutrition consult, if desired.     Les Pou, RD, CSO, LD  Office [phone]

## 2021-12-19 NOTE — Unmapped (Signed)
Addended by: Thompson CaulLIMKE, BETH on: 12/19/2021 10:33 AM     Modules accepted: Orders

## 2021-12-19 NOTE — Unmapped (Signed)
GENOMIC TESTING  [facility] request faxed, pathology case accession number [id number], on December 19, 2021 by Lowella CurbJENN L. Shebra Muldrow.

## 2021-12-22 MED ORDER — folic acid (FOLVITE) 1 MG tablet
1 | ORAL_TABLET | Freq: Every day | ORAL | 3 refills | 30.00000 days | Status: AC
Start: 2021-12-22 — End: 2022-03-09

## 2021-12-22 NOTE — Unmapped (Signed)
Yes, should be 30. I sent a new rx.

## 2021-12-22 NOTE — Unmapped (Signed)
[facility]  [facility]    [facility] calling nurse triage line to clarify quantity of Folic Acid prescription. Patient thinks the rx should be for #30 tablets, but it is written for 3 tablets.    Please call [facility] at [phone] to clarify.    Answer Assessment - Initial Assessment Questions  folic acid (FOLVITE) 1 MG tablet     Order Details  Dose: 1 mg Route: Oral Frequency: Daily  Dispense Quantity: 3 tablet Refills: 3  Sig: Take 1 tablet (1 mg total) by mouth daily. Start 1 week before first cycle and continue for 21 days after the last dose of pemetrexed.  Start Date: 12/18/21    Protocols used: Medication Question Call-A-[place]

## 2021-12-23 NOTE — Unmapped (Signed)
12/19/21 0005   Pre-op Phone Call   Surgery Time Verified No  (Date only 8/18)   Arrival Time Verified   (Per md)   Surgery Location Verified Yes  [facility])   Remind patient to bring picture ID and insurance card Yes   Medical History Reviewed Yes   NPO Status Reinforced Yes   Ride and Caregiver Arranged Yes   Ride Caregiver Provider Alace, daughter   Phone Number for Ride/Caregiver [phone]     Reviewed Pre-Op instructions with patient. No hx of problems with anesthesia. Reviewed fasting guidelines: NPO 8 hrs prior to arrival time.  Contact prescribing Dr  for specific instructions for holding/resuming ASA for this surgery.  Stop multi vitamins  now until after surgery.  Avoid NSAIDS including  Advil, Ibuprofen, Aleve, or Naproxen 7 days prior to surgery or if less than 7 days, hold from now until procedure. Ok to take Acetaminophen. Reviewed med list: Patient takes medications at night.   Instructed to HOLD lisinopril  the evening before surgery.  Patient voiced understanding of all instructions. Instructions sent to MyChart.

## 2021-12-23 NOTE — Unmapped (Signed)
8/15  Chemo approved. Message sent to team to schedule.     Port 8/18    Needs follow up with cycle 2 of cisplatin/alimta    Future Appointments   Date Time Provider Department Center   12/31/2021  3:00 PM August Albino, MS CCC-A [facility] ENT [facility]   04/16/2022 11:30 AM Ivery Quale, MD [facility] PULM [facility]

## 2021-12-23 NOTE — Unmapped (Signed)
Spoke to schedule her for upcoming app. 8/25 tx and 9/15 tx and Dr.She confirmed app times.

## 2021-12-25 MED FILL — CLINDAMYCIN 900 MG/50 ML IN 0.9% SODIUM CHLORIDE INTRAVENOUS PIGGYBACK: 900 900 mg/50 mL | INTRAVENOUS | Qty: 50

## 2021-12-25 NOTE — Unmapped (Signed)
Was asked to put her on Friday 8/18 for her B12 inj. She is also getting her port placed on Fri. Put her on at 12pm. Told her if she was finished early to call us to see if we could get her in earlier for the inj.

## 2021-12-25 NOTE — Unmapped (Signed)
8/17  Chart reviewed for ongoing navigation follow up.      Port placement 8/18     Message sent to scheduling to scheduled b12 for tomorrow.    Schedule as noted below. Will continue to follow and assist as needed.   Future Appointments   Date Time Provider Department Center   12/31/2021  3:00 PM August Albino, MS CCC-A [facility] ENT [facility]   01/02/2022  9:00 AM INFUSION NURSE 3 [facility] INF [facility]   01/23/2022  9:00 AM INFUSION NURSE 2 [facility] INF [facility]   01/23/2022  9:30 AM Hildred Priest, MD [facility]   04/16/2022 11:30 AM Ivery Quale, MD [facility] PULM [facility]

## 2021-12-26 ENCOUNTER — Ambulatory Visit: Admit: 2021-12-26 | Payer: MEDICARE

## 2021-12-26 ENCOUNTER — Institutional Professional Consult (permissible substitution): Admit: 2021-12-26 | Payer: MEDICARE

## 2021-12-26 DIAGNOSIS — C3412 Malignant neoplasm of upper lobe, left bronchus or lung: Secondary | ICD-10-CM

## 2021-12-26 MED ORDER — propofol 10 mg/ml (DIPRIVAN) 10 mg/mL injection
10 | INTRAVENOUS | Status: AC
Start: 2021-12-26 — End: ?

## 2021-12-26 MED ORDER — oxyCODONE (ROXICODONE) immediate release tablet 5 mg
5 | ORAL | PRN
Start: 2021-12-26 — End: 2021-12-26

## 2021-12-26 MED ORDER — clindamycin in 5 % dextrose (CLEOCIN) 900 mg/50 mL IVPB
900 | INTRAVENOUS
Start: 2021-12-26 — End: 2021-12-26

## 2021-12-26 MED ORDER — oxyCODONE (ROXICODONE) immediate release tablet 2.5 mg
5 | ORAL | PRN
Start: 2021-12-26 — End: 2021-12-26

## 2021-12-26 MED ORDER — propofol (DIPRIVAN) infusion 10 mg/mL
10 | INTRAVENOUS | PRN
Start: 2021-12-26 — End: 2021-12-26
  Administered 2021-12-26: 14:00:00 120 via INTRAVENOUS

## 2021-12-26 MED ORDER — traMADoL (ULTRAM) 50 mg tablet
50 | ORAL_TABLET | Freq: Four times a day (QID) | ORAL | 0 refills | Status: AC | PRN
Start: 2021-12-26 — End: 2021-12-29

## 2021-12-26 MED ORDER — lactated Ringers infusion
INTRAVENOUS | PRN
Start: 2021-12-26 — End: 2021-12-26
  Administered 2021-12-26: 13:00:00 via INTRAVENOUS

## 2021-12-26 MED ORDER — sodium chloride 0.9 % irrigation
0.9 | PRN
Start: 2021-12-26 — End: 2021-12-26
  Administered 2021-12-26: 14:00:00 500

## 2021-12-26 MED ORDER — propofol 10 mg/ml (DIPRIVAN) injection
10 | INTRAVENOUS | PRN
Start: 2021-12-26 — End: 2021-12-26
  Administered 2021-12-26: 14:00:00 40 via INTRAVENOUS

## 2021-12-26 MED ORDER — lactated Ringers infusion
INTRAVENOUS
Start: 2021-12-26 — End: 2021-12-26
  Administered 2021-12-26: 13:00:00 20 mL/h via INTRAVENOUS

## 2021-12-26 MED ORDER — BUPivacaine (PF) (SENSORCAINE) 0.5 % (5 mg/mL) 10 mL in lidocaine (PF) (XYLOCAINE) 10 mg/mL (1 %) 20 mL injection
10 | INTRAMUSCULAR | PRN
Start: 2021-12-26 — End: 2021-12-26
  Administered 2021-12-26: 14:00:00 13 via INTRAMUSCULAR

## 2021-12-26 MED ORDER — fentaNYL (SUBLIMAZE) injection 25 mcg
50 | INTRAMUSCULAR | PRN
Start: 2021-12-26 — End: 2021-12-26

## 2021-12-26 MED ORDER — ondansetron (ZOFRAN) injection 4 mg
4 | Freq: Three times a day (TID) | INTRAMUSCULAR | PRN
Start: 2021-12-26 — End: 2021-12-26

## 2021-12-26 MED ORDER — fentaNYL (SUBLIMAZE) injection 12.5 mcg
50 | INTRAMUSCULAR | PRN
Start: 2021-12-26 — End: 2021-12-26

## 2021-12-26 MED ORDER — BUPivacaine (PF) (SENSORCAINE) 0.5 % (5 mg/mL) Soln
0.5 | INTRAMUSCULAR
Start: 2021-12-26 — End: 2021-12-26

## 2021-12-26 MED ORDER — heparin (porcine) 1,000 unit/mL injection
1000 | INTRAMUSCULAR
Start: 2021-12-26 — End: 2021-12-26

## 2021-12-26 MED ORDER — peppermint oiL liquid 1 mL
PRN
Start: 2021-12-26 — End: 2021-12-26

## 2021-12-26 MED ORDER — dextrose 10%-water (D10W) IV soln
INTRAVENOUS | PRN
Start: 2021-12-26 — End: 2021-12-26

## 2021-12-26 MED ORDER — heparin (porcine) 5,000 unit/mL 2,500 Units in sodium chloride 0.9 % 250 mL IV infusion
5000 | INTRAVENOUS | PRN
Start: 2021-12-26 — End: 2021-12-26
  Administered 2021-12-26: 14:00:00 2500

## 2021-12-26 MED ORDER — lidocaine (PF) 2% (20 mg/mL) Soln
20 | INTRAMUSCULAR | PRN
Start: 2021-12-26 — End: 2021-12-26
  Administered 2021-12-26: 14:00:00 3 via INTRAVENOUS

## 2021-12-26 MED ORDER — glucose chewable tablet 12 g
4 | ORAL | PRN
Start: 2021-12-26 — End: 2021-12-26

## 2021-12-26 MED ORDER — lidocaine (PF) (XYLOCAINE) 10 mg/mL (1 %)
10 | INTRAMUSCULAR
Start: 2021-12-26 — End: 2021-12-26

## 2021-12-26 MED ORDER — acetaminophen (TYLENOL) tablet 975 mg
325 | ORAL | Status: AC | PRN
Start: 2021-12-26 — End: 2021-12-26
  Administered 2021-12-26: 13:00:00 975 mg via ORAL

## 2021-12-26 MED ORDER — naloxone (NARCAN) injection 0.04 mg
0.4 | INTRAMUSCULAR | PRN
Start: 2021-12-26 — End: 2021-12-26

## 2021-12-26 MED ORDER — clindamycin (CLEOCIN) 900 mg in sodium chloride 0.9% 50 mL IVPB
900 | INTRAVENOUS | Status: AC | PRN
Start: 2021-12-26 — End: 2021-12-26
  Administered 2021-12-26: 14:00:00 900 mg via INTRAVENOUS

## 2021-12-26 MED FILL — HEPARIN (PORCINE) 1,000 UNIT/ML INJECTION SOLUTION: 1000 1,000 unit/mL | INTRAMUSCULAR | Qty: 10

## 2021-12-26 MED FILL — PROPOFOL 10 MG/ML INTRAVENOUS EMULSION: 10 10 mg/mL | INTRAVENOUS | Qty: 20

## 2021-12-26 MED FILL — CLINDAMYCIN 900 MG/50 ML IN 5 % DEXTROSE INTRAVENOUS PIGGYBACK: 900 900 mg/50 mL | INTRAVENOUS | Qty: 50

## 2021-12-26 MED FILL — XYLOCAINE-MPF 10 MG/ML (1 %) INJECTION SOLUTION: 10 10 mg/mL (1 %) | INTRAMUSCULAR | Qty: 10

## 2021-12-26 MED FILL — TYLENOL 325 MG TABLET: 325 325 mg | ORAL | Qty: 3

## 2021-12-26 MED FILL — SENSORCAINE-MPF 0.5 % (5 MG/ML) INJECTION SOLUTION: 0.5 0.5 % (5 mg/mL) | INTRAMUSCULAR | Qty: 30

## 2021-12-26 NOTE — Unmapped (Signed)
[place]  DEPARTMENT OF ANESTHESIOLOGY  PRE-PROCEDURAL EVALUATION    Joanna Lopez is a 71 y.o. 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       No history of anesthetic complications   I have reviewed the History and Physical Exam, any relevant changes are noted in the anesthesia pre-operative evaluation.      Cardiovascular:    Exercise tolerance: (Endurance improving since previous surgery)  Duke Met score: 3 - Walking on a flat surface for one or two blocks.  (+) hyperlipidemia.    Hypertension is well controlled.  Valvular problems/murmurs related to MR.    (-) past MI, CAD, CABG/stent, dysrhythmias, CHF.  ROS comment:   ECHO limited with contrast 02/23/17  Impressions:  No evidence of apical thrombus on definity echo      ECHO complete with bubble study 02/22/2017  Study Conclusions   - Left ventricle: The cavity size was normal. Wall thickness was     normal. Systolic function was normal. The estimated ejection     fraction was in the range of 60% to 65%. Wall motion was normal;     there were no regional wall motion abnormalities.There was a     small, well circumscribed echodensity at the left ventricular     apex, of uncertain etiology. There are no accompanying wall motion     abnormalities to suggest an apical thrombus. Doppler parameters     are consistent with abnormal left ventricular relaxation (grade 1     diastolic dysfunction).   - Mitral valve: Mild regurgitation.   - Right ventricle: Systolic function was normal.   - Atrial septum: No defect or patent foramen ovale was identified.   - Pulmonic valve: Peak gradient (S): 4mm Hg.       Neuro/Muscoloskeletal/Psych:    (+) neuromuscular disease (< 50%  LICA stenosis, RLS), TIA, headaches (Migraines) and anxiety.    CVA (no residual symptoms).  (-) seizures.   Comments: 02/2017 and 03/2017. S/p loop recorder in 03/2017, no AF, ILR removed in 03/2021. Pt denies residual deficits. Taking full dose ASA (last dose 11/08/21 in preparation for surgery), atorvastatin. Per Dr. Harriette Bouillon' OV note 10/02/21, She will decrease her aspirin to 81mg  7 days prior to surgery. Pt instructed to start taking baby aspirin and continue baby aspirin and statin perioperatively.    Pulmonary:          Sleep apnea (Not on cpap, s/p UPP).    (-) COPD, asthma.   ROS comment: . Lung cancer/lung nodule: S/p right VATS middle lobectomy in 11/2016. Pt with enlarging left upper lobe nodule. S/p lingulectomy 11/18/21      GI/Hepatic/Renal:      GERD is well controlled.      (-) liver disease, renal disease.    Endo/Other:    (+) cancer (lung  cancer s/p resection).      (-) diabetes mellitus, hypothyroidism, hyperthyroidism, no anemia, no thrombocytopenia.        Past Medical History     Past Medical History:   Diagnosis Date    Anxiety     Basal cell cancer  Cataract     Hyperlipidemia     Hypertension     Lung nodule     OSA (obstructive sleep apnea)     RLS (restless legs syndrome)     Stroke (CMS-HCC)     5 yrs ago, TIA X 2       Past Surgical History     Past Surgical History:   Procedure Laterality Date    APPENDECTOMY      CATARACT EXTRACTION, BILATERAL Bilateral 07/2021    COLONOSCOPY N/A 01/23/2013    Procedure: COLONOSCOPY WITH MAC;  Surgeon: Lurene Shadow, MD;  Location: [facility] ENDOSCOPY;  Service: Gastroenterology;  Laterality: N/A;    FLEXIBLE BRONCHOSCOPY W/ UPPER ENDOSCOPY N/A 09/17/2016    Procedure: BRONCHOSCOPY SUPER D WITH FLUORO AND BIOPSY-EBUS/NAVI;  Surgeon: Johnny Bridge, MD;  Location: [facility] ENDOSCOPY;  Service: Pulmonary;  Laterality: N/A;    LOOP IMPLANT IMPLANTATION N/A 04/07/2017    Procedure: Loop  Implantation;  Surgeon: Tennis Must, MD;  Location: [facility] ELECTROPHYSIOLOGY  LAB;  Service: Electrophysiology;  Laterality: N/A;    LOOP REMOVAL Left 04/09/2021    Procedure: Loop Removal;  Surgeon: Lawerance Cruel, MD;  Location: [facility] MIS SUITE;  Service: Electrophysiology;  Laterality: Left;    submastoid gland removal      THORACOSCOPY Right 11/23/2016    Procedure: RIGHT SIDED VATS MIDDLE LOBE LOBECTOMY,HILAR MEDIASTINAL LYMPH NODE DISSECTION;  Surgeon: Janett Billow, MD;  Location: [facility] OR;  Service: Thoracic;  Laterality: Right;    THORACOSCOPY LOBECTOMY VIDEO ASSISTED Left 11/18/2021    Procedure: Left Thoracoscopic Lingulectomy;  Surgeon: Janett Billow, MD;  Location: [facility] OR;  Service: General;  Laterality: Left;    TONSILLECTOMY      UVULOPALATOPHARYNGOPLASTY         Family History     Family History   Adopted: Yes   Problem Relation Age of Onset    Diabetes Son        Social History     Social History     Socioeconomic History    Marital status: Widowed     Spouse name: Not on file    Number of children: Not on file    Years of education: Not on file    Highest education level: Not on file   Occupational History    Not on file   Tobacco Use    Smoking status: Never    Smokeless tobacco: Never   Vaping Use    Vaping Use: Never used   Substance and Sexual Activity    Alcohol use: Yes     Alcohol/week: 2.0 standard drinks     Types: 2 Glasses of wine per week     Comment: occasionally    Drug use: No    Sexual activity: Not Currently     Partners: Male   Other Topics Concern    Caffeine Use No     Comment: 1 can coke/day    Occupational Exposure Yes     Comment: Geographical information systems officer    Exercise No    Seat Belt Yes   Social History Narrative    Not on file     Social Determinants of Health     Financial Resource Strain: Not on file   Physical Activity: Not on file   Stress: Not on file   Social Connections: Not on file   Housing Stability: Not on file       Medications     Allergies:  Allergies   Allergen Reactions  Penicillins Anaphylaxis     Other reaction(s): Facial  Swelling  Has patient had a PCN reaction causing immediate rash, facial/tongue/throat swelling, SOB or lightheadedness with hypotension: Yes  Has patient had a PCN reaction causing severe rash involving mucus membranes or skin necrosis: Yes  Has patient had a PCN reaction that required hospitalization: No  Has patient had a PCN reaction occurring within the last 10 years: No  If all of the above answers are NO, then may proceed with Cephalosporin use.    Adhesive      Other reaction(s): breaks out and itches if on for long time    Formaldehyde Analogues     Nsaids (Non-Steroidal Anti-Inflammatory Drug)      Severe GI issues    Promethazine Hcl      Restless legs    Latex Rash     States she has a sensitivity to latex after being around it for days, she gets a rash: she states that when she wore her cpap machine for 14 days she developed a rash on her face other than this she has no problems stating her dentist wears latex gloves and she has no problems with latex gloves.     Sulfa (Sulfonamide Antibiotics) Rash       Home Meds:  Prior to Admission medications as of 12/23/21 1501   Medication Sig Taking?   aspirin 325 MG tablet Take 1 tablet (325 mg total) by mouth daily. Yes   atorvastatin (LIPITOR) 80 MG tablet Take 1 tablet (80 mg total) by mouth daily. Yes   calcium carbonate (CALCIUM 300 ORAL) Take by mouth. Yes   folic acid (FOLVITE) 1 MG tablet Take 1 tablet (1 mg total) by mouth daily. Start 1 week before first cycle and continue for 21 days after the last dose of pemetrexed. Yes   lisinopriL (PRINIVIL) 10 MG tablet Take 1 tablet (10 mg total) by mouth daily. 10 mg (half tablet) daily Yes   MELATONIN ORAL Take 3-5 mg by mouth at bedtime. PRN for sleep Yes   multivitamin (THERAGRAN) tablet Take 1 tablet by mouth daily. Yes   PARoxetine (PAXIL) 20 MG tablet Take 1 tablet (20 mg total) by mouth daily. Yes   roPINIRole (REQUIP) 0.5 MG tablet Take 1 tablet (0.5 mg total) by mouth daily. Yes    aspirin-acetaminophen-caffeine (EXCEDRIN) 250-250-65 mg per tablet Take 1 tablet by mouth every 6 hours as needed for Pain.    dexAMETHasone (DECADRON) 4 MG tablet Take 4 mg twice a day for five days starting the day before pemetrexed.    diclofenac (VOLTAREN) 0.1 % ophthalmic solution 1 drop if needed (ear itching). ears    lidocaine-prilocaine (EMLA) cream Apply topically if needed.    lipase/protease/amylase (CREON ORAL) Take 1 capsule by mouth if needed (stomach pain).  Patient not taking: Reported on 12/23/2021    naloxone [facility]) 4 mg/actuation Spry Apply 1 spray in one nostril if needed. Call 911. May repeat dose in other nostril if no response in 3 minutes.    proCHLORPERazine (COMPAZINE) 10 MG tablet Take 1 tablet (10 mg total) by mouth every 6 hours as needed (For nausea and vomiting).        Inpatient Meds:  Scheduled:       Continuous:         PRN:     Vital Signs     Wt Readings from Last 3 Encounters:   12/18/21 188 lb 3.2 oz (85.4 kg)   12/04/21 187 lb (  84.8 kg)   11/19/21 190 lb 7.6 oz (86.4 kg)     Ht Readings from Last 3 Encounters:   12/18/21 5' 6.22 (1.682 m)   12/04/21 5' 6 (1.676 m)   11/18/21 5' 6 (1.676 m)     Temp Readings from Last 3 Encounters:   12/18/21 97.9 F (36.6 C) (Temporal)   11/21/21 97.7 F (36.5 C) (Oral)   11/10/21 98 F (36.7 C) (Oral)     BP Readings from Last 3 Encounters:   12/18/21 (!) 130/93   12/04/21 138/74   11/21/21 134/90     Pulse Readings from Last 3 Encounters:   12/18/21 79   12/04/21 93   11/21/21 71     @LASTSAO2 (3)@    Physical Exam     Airway:     Mallampati: II  TM distance: > = 3 FB  Neck ROM: full  (-) no facial hair, neck not short, not intubated      Dental:   - No obvious cracked, loose, chipped, or missing teeth.     Pulmonary:      Breathing: unlabored         Cardiovascular:     Rhythm: regular  Rate: normal    Neuro/Musculoskeletal/Psych:     Mental status: alert and oriented to person, place and time.          Abdominal:       Current OB  Status:       Other Findings:      Laboratory Data     Lab Results   Component Value Date    WBC 12.8 (H) 11/21/2021    HGB 11.7 11/21/2021    HCT 35.7 11/21/2021    MCV 93.2 11/21/2021    PLT 159 11/21/2021       No results found for: [facility]    Lab Results   Component Value Date    GLUCOSE 106 (H) 11/21/2021    BUN 18 11/21/2021    CO2 27 11/21/2021    CREATININE 0.92 11/21/2021    K 4.5 11/21/2021    NA 137 11/21/2021    CL 105 11/21/2021    CALCIUM 8.4 (L) 11/21/2021    ALBUMIN 3.4 (L) 02/22/2017    PROT 7.3 12/29/2016    ALKPHOS 69 12/29/2016    ALT 14 12/29/2016    AST 11 (L) 12/29/2016    BILITOT 0.8 12/29/2016       No results found for: PTT, INR    No results found for: PREGTESTUR, PREGSERUM, HCG, HCGQUANT  PFT's 11/06/21        Component 11/06/21 1035   FVC 3.41   FVC% 112   FEV1 2.67   FEV1% 114   FEV1/FVC 78   FEV1/FVC EXP 64   VC 3.30   VC% 102   RV 2.02   RV% 95   TLC 5.32   TLC% 99   DLCO 16.86   DLCO% 84   RESPONSE TO BRONCHODILATOR 2% FVC, 3% FEV1         Spirometry is normal.   There is no response to bronchodilator demonstrated.   Lung volumes are normal.   The flow-volume loop is normal.   Diffusing capacity is normal.     There have been no significant changes since test from 2020.      CT chest 09/23/21  FINDINGS:  MEDICAL DEVICES: None.    AIRWAYS & LUNGS: Stable postsurgical changes of right middle lobectomy and mild upper zone  predominant emphysema. The lungs are clear.    NODULES: Slight interval increased size of the subsolid left upper lobe nodule which measures roughly 11 x 8 mm (image 144 series 302), previously 9 x 7 mm. This continues to demonstrates slight increase in size and density from the June 2020 examination. No new suspicious pulmonary nodules.    PLEURA: No pleural effusions or pneumothorax.    LOWER NECK: Unremarkable.    HEART: The heart is normal in size.    VASCULAR STRUCTURES: Aorta and main pulmonary artery are normal in caliber.    MEDIASTINUM AND HILA: No pathologically  enlarged lymph nodes.    CHEST WALL AND AXILLA: Unremarkable.    UPPER ABDOMEN: Multiple hepatic cysts are unchanged. Multiple bilateral renal parapelvic cysts.    OSSEOUS STRUCTURES: No suspicious osteolytic or osteoblastic lesions.    Impression   IMPRESSION:    Slight interval increase size of the left upper lobe subsolid nodule, which remains indeterminate but is concerning for an indolent primary lung malignancy. Continued follow-up is recommended.      Anesthesia Plan     ASA 3         Female and current non-smoker    Anesthesia Type:  MAC.      PONV Risk Factors: female, current non-smoker          Planned Special Techniques: Planned Special Techniques:  one lung ventilation.    Induction:   Intravenous induction.      Anesthetic plan and risks discussed with patient.    Plan, alternatives, and risks of anesthesia, including death, have been explained to and discussed with the patient/legal guardian.  By my assessment, the patient/legal guardian understands and agrees.  Scenario presented in detail.  Questions answered.    Blood products not discussed.      Plan discussed with CRNA and attending.

## 2021-12-26 NOTE — Unmapped (Signed)
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         Severina Sykora     Date: 12/26/2021  Time: 10:16 AM

## 2021-12-26 NOTE — Unmapped (Signed)
OPERATIVE NOTE    Joanna Lopez  [id number]    12/26/2021    PREOPERATIVE DIAGNOSIS:  NSCLC     POSTOPERATIVE DIAGNOSIS:  NSCLC     PROCEDURE:  1. Right internal jugular vein MediPort placement under ultrasound guidance utilizing fluoroscopy.      SURGEON:  Penni Bombard, MD     RESIDENT:  Alanson Puls    ANESTHESIA:  15mL local anesthesia (0.5% lidocaine and 0.25% marcaine at final concentration) with Monitored Anesthesia Care (MAC)     INDICATIONS AND CONSENT:  A  71 y.o. female presents with NSCLC who needs long term central venous access. Presents for [employer] A Cath placement     INTRAOPERATIVE FINDINGS:  Normal right internal jugular venous anatomy     OPERATION IN DETAIL:  The patient was brought to the operating room and placed supine on the operating room table.  Monitored Anesthesia Care was established. Both arms were adequately passed and care taken to ensure no traction. The patient was given IV antibiotics. The neck, chest, abdomen and pelvis was prepped and draped in the standard surgical fashion. I performed a time-out procedure with the operating room personnel. We accessed the right internal jugular vein under ultrasound guidance and a guidewire was placed. The guidewire was confirmed in the superior vena cava utilizing fluoroscopy. We then anesthetized the area for our port pocket and along the right neck with 0.25% Marcaine 0.5% lidocaine. A small nick was made in the skin and inserted the dilator and introducer followed by a 6-French single-lumen catheter. The catheter was also confirmed in the superior vena cava utilizing fluoroscopy. We then created our port pocket. The catheter was tunneled into the port pocket, cut to the appropriate length, and secured to the port. The port aspirated and flushed well. We placed a [employer], 6-French, single-lumen. The port was secured in the port pocket with two 3-0 Prolene sutures in the cephalad aspects of the port. The port continued to aspirate  and flush well with heparinized saline. A final fluoroscopic image was obtained which showed good placement of the catheter. The wounds were irrigated, and the skin was closed with 3-0 Vicryl deep dermal sutures followed by 4-0 Monocryl and Dermabond for the skin.      A post-procedure CXR was obtained and revealed good positioning of the catheter with no pneumothorax.     ESTIMATED BLOOD LOSS:  Minimal.    COMPLICATIONS:  None

## 2021-12-26 NOTE — Unmapped (Signed)
INTRA-OP POST BRIEFING NOTE: Joanna Lopez      Specimens:     Prior to leaving the room: Nurse confirmed name of procedure, completion of instrument, sponge & needle counts, reads specimen labels aloud including patient name and addresses any equipment issues? Nurse confirmed wound class. Nurse to surgeon and anesthesia: What are key concerns for recovery and management of the patient?  Yes      Blood products stored at appropriate temperatures prior to return to blood bank (if applicable)? N/A      Patient identification band secured on patient prior to transfer out of the operating room? Yes    Temporary devices implanted for the duration of the surgery removed and evaluated for intactness and completeness prior to closure? N/A      Other Comments:     Signed: Lynett Fish    Date: 12/26/2021    Time: 10:11 AM

## 2021-12-26 NOTE — Unmapped (Signed)
Port Discharge Instructions    General Information:  You will have a clear glue dressing or steri-strips covered with gauze and adhesive dressing/tape on your incision. A small amount of blood may be present - this is normal.    Care of your incisions:  You may shower (do not scrub at incisions, okay to allow water to run over them):  One day after surgery  Once you have showered, pat incisions dry and leave steri-strips/glue in place.  No submersing in bath, hot tub, swimming pool, or lake for 6 weeks.  Ice packs may be applied to the incisions for 20 minutes per hour for 48 hours while awake to help with pain and swelling.    Medications:  Take your pain medication (Norco or Percocet) 1 tablet every 4 hours as needed or as directed. Take with food to minimize nausea and vomiting.   You may also take the additional medications if approved by your surgeon:  Acetaminophen 500 mg every 6 hours as needed for pain (do not take WITH another pain medication that contains acetaminophen).  Ibuprofen 600 mg four times a day with food.    Activities:  You may resume light activities and a regular diet. Do not lift anything heavier than a half-gallon of milk for at least 2 weeks.  Arm range of motion exercises are encouraged.  Do not drive   While taking pain medicine.  You may return to work in approximately:  1-2 days.    When to call the nurse/surgeon:  If your incision becomes increasingly red, sore, or warm to the touch.  If you develop a fever greater than 101.5.  If you experience chills, nausea, and/or vomiting.  If your incision becomes very swollen or hard with bruising.  If your incision starts to drain fluid or blood.    If you need medical assistance:  During business hours (8am-5pm), call [phone] and ask for your nurse (Cathy).  After business hours, call [phone] to be connected to the answering service and the on call physician.  Please call Lacey at [phone] if you want to schedule a follow up  appointment or have any other questions.    Office Locations:  [facility]; [address]; [place] [address] ,  [phone] or    [facility]; [address]; 4th floor  [phone]

## 2021-12-26 NOTE — Unmapped (Signed)
Protocol Number of ICF(s) Presented: [id number]  Protocol Title: A Randomized Phase III Trial for Surgically Resected  Early Stage Non-Small Cell Lung Cancer: Crizotinib  versus Observation for Patients with Tumors Harboring  the Anaplastic Lymphoma Kinase (ALK) Fusion Protein  Date of initial patient contact (Date ICF Provided to patient/LAR): 18Aug2023    Narrative Note:     Per physician Dr. Harriette Bouillon and Dr. Zerita Boers, the potential Subject/LAR was contacted over the phone to discuss the above mentioned study. 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  CRC explained to patient that if interested and eligible the ALK study would not commence until after adjuvant treatment was complete, patient verbalized understanding and is interested in proceeding with screening for this sub-study when the end of adjuvant treatment gets closer.  Patient instructed to call CRC with any new questions or concerns.      Joanna Lopez Joanna Lopez [name]

## 2021-12-26 NOTE — Unmapped (Signed)
8/18  Patient did not get b12 today. [facility]    Future Appointments   Date Time Provider Department Center   12/31/2021  3:00 PM August AlbinoStephanie Carlson, MS [facility]   01/02/2022  9:00 AM [facility]URSE 3 [facility]   01/23/2022  9:00 AM INFUSION NURSE 2 [facility] 202 [facility]   01/23/2022  9:30 AM Hildred Priesthristopher Lemmon, MD [facility]   04/16/2022 11:30 AM Ivery Qualeaniel M Tanase, MD [facility]

## 2021-12-26 NOTE — Unmapped (Signed)
Anesthesia Post Note    Patient: Joanna Lopez    Procedure(s) Performed: Procedure(s):  INSERTION POWER PORT A CATH    Anesthesia type: MAC    Patient location: Same Day Surgery    Airway: Patent    Post pain: Adequate analgesia    Nausea / Vomiting: Absent    Post-operative Hydration Status: Adequate    Post assessment: no apparent anesthetic complications    Last Vitals:   Vitals:    12/23/21 1501 12/26/21 0800 12/26/21 1021   BP:  122/77 100/62   Pulse:  73 74   Resp:  18 18   Temp:  98.5 F (36.9 C) 97.3 F (36.3 C)   TempSrc:  Temporal Temporal   SpO2:  97% 98%   Weight: 188 lb (85.3 kg) 186 lb (84.4 kg)    Height: 5' 6 (1.676 m)          Post vital signs: stable    Level of consciousness: awake, alert , and oriented    Complications:  There were no known notable events for this encounter.

## 2021-12-26 NOTE — Unmapped (Signed)
Anesthesia Transfer of Care Note    Patient: Joanna Lopez  Procedure(s) Performed: Procedure(s):  INSERTION POWER PORT A CATH    Patient location: PACU    Anesthesia type: MAC    Airway Device on Arrival to PACU/ICU: Nasal Cannula    IV Access: Peripheral    Monitors Recommended to be Used During PACU/ICU: Standard Monitors    Outstanding Issues to Address: None    Level of Consciousness: awake, alert , and oriented    Post vital signs:    Vitals:    12/26/21 0800   BP: 110/60   Pulse: 73   Resp: 18   Temp: 98.5 F (36.9 C)   SpO2: 99%       Complications:  No notable events documented.    Date 12/25/21 0700 - 12/26/21 0659(Not Admitted) 12/26/21 0700 - 12/27/21 0659   Shift 0700-1459 1500-2259 2300-0659 24 Hour Total 0700-1459 1500-2259 2300-0659 24 Hour Total   INTAKE   I.V.(mL/kg)     700(8.3)   700(8.3)     Volume (mL) (lactated Ringers infusion)     700   700   Shift Total(mL/kg)     700(8.3)   700(8.3)   OUTPUT   Shift Total(mL/kg)           Weight (kg) 85.3 85.3 85.3 85.3 84.4 84.4 84.4 84.4

## 2021-12-26 NOTE — Unmapped (Signed)
Surgical Oncology History and Physical Note    Patient: Joanna Lopez Admitting Service: Surgical Oncology   MRN: [id number] Age: 71 y.o.     Attending: Penni Bombard, MD       Date of Admission: 12/26/2021  8:28 AM    Chief Complaint: Need for central venous access        History of Present Illness:   Patient is a 71 y.o. female presents with NSCLC. The patient will be needing  systemic therapy and therefore presents for placement of a Port A Cath    Review of System:  Constitutional: Negative for activity change, chills, diaphoresis, fatigue and fever.   HENT: Negative.    Eyes: Negative.    Respiratory: Negative for cough, chest tightness, shortness of breath and wheezing.  Cardiovascular: Negative for chest pain.   Gastrointestinal: Negative for abdominal distention, abdominal pain, blood in stool, nausea and vomiting.   Endocrine: Negative.    Genitourinary: Negative.    Musculoskeletal: Negative.    Skin: Negative.    Allergic/Immunologic: Negative.   Neurological: Negative for light-headedness and headaches.   Hematological: Negative.    Psychiatric/Behavioral: Negative.          Medications:  Medications Prior to Admission   Medication Sig Dispense Refill Last Dose    aspirin 325 MG tablet Take 1 tablet (325 mg total) by mouth daily. 30 tablet 0 12/22/2021    atorvastatin (LIPITOR) 80 MG tablet Take 1 tablet (80 mg total) by mouth daily.   12/22/2021    calcium carbonate (CALCIUM 300 ORAL) Take by mouth.   12/22/2021    folic acid (FOLVITE) 1 MG tablet Take 1 tablet (1 mg total) by mouth daily. Start 1 week before first cycle and continue for 21 days after the last dose of pemetrexed. 30 tablet 3 12/22/2021    lisinopriL (PRINIVIL) 10 MG tablet Take 1 tablet (10 mg total) by mouth daily. 10 mg (half tablet) daily   12/22/2021    MELATONIN ORAL Take 3-5 mg by mouth at bedtime. PRN for sleep   12/22/2021    multivitamin (THERAGRAN) tablet Take 1 tablet by mouth daily.   12/22/2021    PARoxetine (PAXIL) 20 MG  tablet Take 1 tablet (20 mg total) by mouth daily.   12/22/2021    roPINIRole (REQUIP) 0.5 MG tablet Take 1 tablet (0.5 mg total) by mouth daily.   12/22/2021    aspirin-acetaminophen-caffeine (EXCEDRIN) 250-250-65 mg per tablet Take 1 tablet by mouth every 6 hours as needed for Pain.       dexAMETHasone (DECADRON) 4 MG tablet Take 4 mg twice a day for five days starting the day before pemetrexed. 10 tablet 3     diclofenac (VOLTAREN) 0.1 % ophthalmic solution 1 drop if needed (ear itching). ears       lidocaine-prilocaine (EMLA) cream Apply topically if needed. 30 g 3     lipase/protease/amylase (CREON ORAL) Take 1 capsule by mouth if needed (stomach pain). (Patient not taking: Reported on 12/23/2021)   Not Taking    naloxone (NARCAN) 4 mg/actuation Spry Apply 1 spray in one nostril if needed. Call 911. May repeat dose in other nostril if no response in 3 minutes. 2 each 1     proCHLORPERazine (COMPAZINE) 10 MG tablet Take 1 tablet (10 mg total) by mouth every 6 hours as needed (For nausea and vomiting). 30 tablet 3        Allergies   Allergen Reactions    Penicillins Anaphylaxis  Other reaction(s): Facial Swelling  Has patient had a PCN reaction causing immediate rash, facial/tongue/throat swelling, SOB or lightheadedness with hypotension: Yes  Has patient had a PCN reaction causing severe rash involving mucus membranes or skin necrosis: Yes  Has patient had a PCN reaction that required hospitalization: No  Has patient had a PCN reaction occurring within the last 10 years: No  If all of the above answers are NO, then may proceed with Cephalosporin use.    Adhesive      Other reaction(s): breaks out and itches if on for long time    Formaldehyde Analogues     Nsaids (Non-Steroidal Anti-Inflammatory Drug)      Severe GI issues    Promethazine Hcl      Restless legs    Latex Rash     States she has a sensitivity to latex after being around it for days, she gets a rash: she states that when she wore her cpap machine  for 14 days she developed a rash on her face other than this she has no problems stating her dentist wears latex gloves and she has no problems with latex gloves.     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)       12/04/2021 Cancer Staged    Staging form: Lung, AJCC 8th Edition  - Pathologic stage from 12/04/2021: Stage IIB (pT1a, pN1, cM0) - Signed by Janett Billow, MD on 12/04/2021           12/25/2021 -  Systemic Therapy (Oral and IV)    Treatment Summary   Treatment goal Curative   Plan Name OP Non-Small Cell Lung PEMEtrexed / CISplatin   Status Active   Start Date 12/25/2021 (Planned)   End Date 02/28/2022 (Planned)   Provider Hildred Priest, MD   Chemotherapy CISPLATIN CHEMO INFUSION  (WITH MANNITOL), 75 mg/m2, Intravenous, Once, 0 of 4 cycles  PEMETREXED INFUSION, 500 mg/m2, Intravenous, Once, 0 of 4 cycles                    Past Medical History:   Diagnosis Date    Anxiety     Basal cell cancer     Cataract     Hyperlipidemia     Hypertension     Lung nodule     OSA (obstructive sleep apnea)     RLS (restless legs syndrome)     Stroke (CMS-HCC)     5 yrs ago, TIA X 2       Past Surgical History:   Procedure Laterality Date    APPENDECTOMY      CATARACT EXTRACTION, BILATERAL Bilateral 07/2021    COLONOSCOPY N/A 01/23/2013    Procedure: COLONOSCOPY WITH MAC;  Surgeon: Lurene Shadow, MD;  Location[facility] ENDOSCOPY;  Service: Gastroenterology;  Laterality: N/A;    FLEXIBLE BRONCHOSCOPY W/ UPPER ENDOSCOPY N/A 09/17/2016    Procedure: BRONCHOSCOPY SUPER D WITH FLUORO AND BIOPSY-EBUS/NAVI;  Surgeon: Johnny Bridge, MD;  Location: [facility] ENDOSCOPY;  Service: Pulmonary;  Laterality: N/A;    LOOP IMPLANT IMPLANTATION N/A 04/07/2017    Procedure: Loop  Implantation;  Surgeon: Tennis Must, MD;  Location: [facility] ELECTROPHYSIOLOGY LAB;  Service:  Electrophysiology;  Laterality: N/A;    LOOP REMOVAL Left 04/09/2021    Procedure: Loop Removal;  Surgeon: Lawerance Cruel,  MD;  Location: [facility] MIS SUITE;  Service: Electrophysiology;  Laterality: Left;    submastoid gland removal      THORACOSCOPY Right 11/23/2016    Procedure: RIGHT SIDED VATS MIDDLE LOBE LOBECTOMY,HILAR MEDIASTINAL LYMPH NODE DISSECTION;  Surgeon: Janett Billow, MD;  Location: [facility] OR;  Service: Thoracic;  Laterality: Right;    THORACOSCOPY LOBECTOMY VIDEO ASSISTED Left 11/18/2021    Procedure: Left Thoracoscopic Lingulectomy;  Surgeon: Janett Billow, MD;  Location: [facility] OR;  Service: General;  Laterality: Left;    TONSILLECTOMY      UVULOPALATOPHARYNGOPLASTY         Family History   Adopted: Yes   Problem Relation Age of Onset    Diabetes Son        Social History     Socioeconomic History    Marital status: Widowed     Spouse name: Not on file    Number of children: Not on file    Years of education: Not on file    Highest education level: Not on file   Occupational History    Not on file   Tobacco Use    Smoking status: Never    Smokeless tobacco: Never   Vaping Use    Vaping Use: Never used   Substance and Sexual Activity    Alcohol use: Yes     Alcohol/week: 2.0 standard drinks     Types: 2 Glasses of wine per week     Comment: occasionally    Drug use: No    Sexual activity: Not Currently     Partners: Male   Other Topics Concern    Caffeine Use No     Comment: 1 can coke/day    Occupational Exposure Yes     Comment: Geographical information systems officer    Exercise No    Seat Belt Yes   Social History Narrative    Not on file     Social Determinants of Health     Financial Resource Strain: Not on file   Physical Activity: Not on file   Stress: Not on file   Social Connections: Not on file   Housing Stability: Not on file         Vital Signs:  Vitals:    12/26/21 0800   BP: 122/77   Pulse: 73   Resp: 18   Temp: 98.5 F (36.9 C)   SpO2: 97%       Physical Exam:  General: no apparent distress,  conversant  Eyes: anicteric sclera, moist conjunctiva, pupils equal and round reactive to light  HENT: atraumatic mucous membranes moist  Neck: trachea midline, full range of motion, no thyromegaly or adenopathy  Cardiac: regular rate and rhythm, no murmurs, rubs, and gallops  Respiratory: clear to auscultation bilaterally, normal respiratory effort  Gastrointestinal: abdomen is soft nontender, nondistended, no palpable masses  Extremity: warm, no clubbing, cyanosis, or edema  Lymph: no palpable lymphadenopathy  Skin: no rashes or ulcers, normal temperature and turgor  Psych: appropriate affect, alert and oriented to person, place, and time        Labs:  Lab Results   Component Value Date    WBC 12.8 (H) 11/21/2021    RBC 3.83 11/21/2021    HCT 35.7 11/21/2021    MCV 93.2 11/21/2021    MCH 30.5 11/21/2021    MCHC 32.7 11/21/2021    RDW 14.3 11/21/2021    PLT 159 11/21/2021     Lab Results   Component Value Date  WBC 12.8 (H) 11/21/2021    RBC 3.83 11/21/2021    HCT 35.7 11/21/2021    MCV 93.2 11/21/2021    MCH 30.5 11/21/2021    MCHC 32.7 11/21/2021    RDW 14.3 11/21/2021    PLT 159 11/21/2021           Invalid input(s): LABALBU        Imaging:  No results found.      Assessment and Plan  A 70 y.o. female presents with NSCLC and need for central venous access  1. To OR today for placement of a Port A Cath  2. The risks, benefits, and alternatives of the proposed procedure were discussed with the patient and their family in detail which includes bleeding, infection, pneumothorax, and hemothorax. They were given the opportunity to ask questions and have those questions answered. They signed, witnessed, written informed consent.

## 2021-12-26 NOTE — Unmapped (Signed)
Pt from OR to SDS post MAC, A&Ox4, VSS, denies pain or nausea, tolerating PO, DC instructions discussed and provided to pt and pt's family-verbalized understanding, pt has all belongings, IV DC'd

## 2021-12-26 NOTE — Unmapped (Signed)
Pt did no receive B12 injection today due to getting getting It on 12/18/2021.

## 2021-12-31 ENCOUNTER — Ambulatory Visit: Admit: 2021-12-31 | Discharge: 2021-12-31 | Payer: MEDICARE | Attending: Audiologist

## 2021-12-31 DIAGNOSIS — C3412 Malignant neoplasm of upper lobe, left bronchus or lung: Secondary | ICD-10-CM

## 2021-12-31 NOTE — Unmapped (Signed)
Joanna Lopez  12/27/1950  [id number]    Ototoxic Monitoring Survey      12/31/2021     4:00 PM   AMB ENT OTOTOXIC MONITORING   Referral Source oncology - Dr. Germaine PomfretLemmonn   Test Interval Type Baseline   Hearing loss onset Feels hearing is normal   Tinnitus? No   Family members with hearing loss Yes   If yes, who? Mother - later in life; worn hearing aids for 5-10 years   History of noise exposure? No   Any balance concerns- dizziness, vertigo, imbalance? slight since surgery; getting better   Hearing aid user? No   Otoscopy? Non-occluding cerumen   Tympanometry type Type A Au   Hearing Loss Grading N/A   Tinnitus Grading N/A   Vertigo Grading N/A       Ototoxic Monitoring Air Conduction Results          12/31/2021    16:00   Air Conduction Results   125 Hz- Right 10   250 Hz- Right 10   500 Hz- Right 0   750 Hz- Right 5   1000 Hz- Right 5   1500 Hz- Right 10   2000 Hz- Right 15   3000 Hz- Right 25   4000 Hz- Right 25   6000 Hz- Right 20   8000 Hz- Right 20   9000 Hz- Right 20   10 kHz- Right 40   11.2 kHz- Right 55   12.5 kHz- Right 70   14 kHz- Right 75   16 kHz- Right 60   18 kHz- Right NR   20 kHz- Right NR   125 Hz- Left 10   250 Hz- Left 10   500 Hz- Left 10   750 Hz- Left 15   1000 Hz- Left 10   1500 Hz- Left 10   2000 Hz- Left 15   3000 Hz- Left 20   4000 Hz- Left 15   6000 Hz- Left 10   8000 Hz- Left 35   9000 Hz- Left 40   10 kHz- Left 35   11.2 kHz- Left 60   12.5 kHz- Left 80   14 kHz- Left 80   16 kHz- Left 60   18 kHz- Left 30   20 kHz- Left NR   Bone Conduction Results   500 Hz- Right 15   750 Hz- Right 15   1000 Hz- Right 5   1500 Hz- Right 15   2000 Hz- Right 20   3000 Hz- Right 20   4000 Hz- Right 20   500 Hz- Left 5   750 Hz- Left 15   1000 Hz- Left 10   1500 Hz- Left 15   2000 Hz- Left 20   3000 Hz- Left 15   4000 Hz- Left 15       Determining ototoxic follow up  Grading Scale for change hearing loss:  This is baseline audiogram.    Hearing is essentially within normal limits up to 9000 Hz.  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      Grading Scale Tinnitus:  Patient denies any symptoms of tinnitus/ringing/noises in her ears.     Grading Scale Vertigo:  This is baseline testing.   She reports slight off-balance sensation since her surgery.    She states that this has been getting better as she has gotten stronger.    Date of next scheduled appointment :  02/13/2022 - 6 weeks after her start of chemotherapy this coming Friday.     Recommendations:  Discussed test results with patient.   Discussed why we are monitoring hearing while on cisplatin/chemo medication.   Advised patient to discuss any change in her hearing, balance or start of tinnitus ear noises with her oncologist.    It is important to retest if she experiences any of these symptoms to see if she has had any change to hearing status.    This may affect dosing/type of chemotherapy medications she is described.    Patient reported understanding.    Escorted patient to the front desk to schedule next appointment 6 weeks from Friday (she starts chemotherapy on that date).

## 2022-01-02 ENCOUNTER — Ambulatory Visit: Admit: 2022-01-02 | Payer: MEDICARE

## 2022-01-02 DIAGNOSIS — C3412 Malignant neoplasm of upper lobe, left bronchus or lung: Secondary | ICD-10-CM

## 2022-01-02 DIAGNOSIS — Z5111 Encounter for antineoplastic chemotherapy: Secondary | ICD-10-CM

## 2022-01-02 LAB — COMPREHENSIVE METABOLIC PANEL
ALT: 15 U/L (ref 7–52)
AST: 17 U/L (ref 13–39)
Albumin: 4 g/dL (ref 3.5–5.7)
Alkaline Phosphatase: 59 U/L (ref 36–125)
Anion Gap: 8 mmol/L (ref 3–16)
BUN: 19 mg/dL (ref 7–25)
CO2: 26 mmol/L (ref 21–33)
Calcium: 9.3 mg/dL (ref 8.6–10.3)
Chloride: 106 mmol/L (ref 98–110)
Creatinine: 1.02 mg/dL (ref 0.60–1.30)
EGFR: 59
Glucose: 104 mg/dL (ref 70–100)
Osmolality, Calculated: 293 mOsm/kg (ref 278–305)
Potassium: 4.1 mmol/L (ref 3.5–5.3)
Sodium: 140 mmol/L (ref 133–146)
Total Bilirubin: 0.5 mg/dL (ref 0.0–1.5)
Total Protein: 6.4 g/dL (ref 6.4–8.9)

## 2022-01-02 LAB — DIFFERENTIAL
Basophils Absolute: 60 /uL (ref 0–200)
Basophils Relative: 0.7 % (ref 0.0–1.0)
Eosinophils Absolute: 327 /uL (ref 15–500)
Eosinophils Relative: 3.8 % (ref 0.0–8.0)
Lymphocytes Absolute: 2761 /uL (ref 850–3900)
Lymphocytes Relative: 32.1 % (ref 15.0–45.0)
Monocytes Absolute: 860 /uL (ref 200–950)
Monocytes Relative: 10 % (ref 0.0–12.0)
Neutrophils Absolute: 4592 /uL (ref 1500–7800)
Neutrophils Relative: 53.4 % (ref 40.0–80.0)
nRBC: 0 /100 WBC (ref 0–0)

## 2022-01-02 LAB — CBC
Hematocrit: 38.8 % (ref 35.0–45.0)
Hemoglobin: 13.1 g/dL (ref 11.7–15.5)
MCH: 31.1 pg (ref 27.0–33.0)
MCHC: 33.9 g/dL (ref 32.0–36.0)
MCV: 91.8 fL (ref 80.0–100.0)
MPV: 9.1 fL (ref 7.5–11.5)
Platelets: 187 10*3/uL (ref 140–400)
RBC: 4.22 10*6/uL (ref 3.80–5.10)
RDW: 13.9 % (ref 11.0–15.0)
WBC: 8.6 10*3/uL (ref 3.8–10.8)

## 2022-01-02 LAB — MAGNESIUM: Magnesium: 1.8 mg/dL (ref 1.5–2.5)

## 2022-01-02 MED ORDER — potassium chloride 20 mEq, magnesium sulfate 1 g in sodium chloride 0.45% (1/2 NS) 1,000 mL IV infusion
2 | INTRAVENOUS | Status: AC
Start: 2022-01-02 — End: 2022-01-02
  Administered 2022-01-02: 18:00:00 500 mL/h via INTRAVENOUS

## 2022-01-02 MED ORDER — sodium chloride 0.9% flush 10 mL
Freq: Every day | INTRAMUSCULAR | PRN
Start: 2022-01-02 — End: 2022-01-02

## 2022-01-02 MED ORDER — proCHLORPERazine (COMPAZINE) tablet 10 mg
5 | Freq: Four times a day (QID) | ORAL | PRN
Start: 2022-01-02 — End: 2022-01-02

## 2022-01-02 MED ORDER — aprepitant (CINVANTI) IV emulsion 130 mg
7.2 | Freq: Once | INTRAVENOUS | Status: AC
Start: 2022-01-02 — End: 2022-01-02
  Administered 2022-01-02: 14:00:00 130 mg via INTRAVENOUS

## 2022-01-02 MED ORDER — sodium chloride 0.9 % infusion
Freq: Every day | INTRAVENOUS | PRN
Start: 2022-01-02 — End: 2022-01-02
  Administered 2022-01-02: 16:00:00 25 mL/h via INTRAVENOUS

## 2022-01-02 MED ORDER — PEMEtrexed disodium (ALIMTA) 1,000 mg in sodium chloride 0.9 % 100 mL chemo infusion
100 | Freq: Once | INTRAVENOUS | Status: AC
Start: 2022-01-02 — End: 2022-01-02
  Administered 2022-01-02: 15:00:00 1000 mg/m2 via INTRAVENOUS

## 2022-01-02 MED ORDER — LORazepam (ATIVAN) tablet 0.5 mg
0.5 | Freq: Four times a day (QID) | ORAL | PRN
Start: 2022-01-02 — End: 2022-01-02

## 2022-01-02 MED ORDER — CISplatin (PLATINOL) 150 mg, mannitol 25 % 12.5 g in sodium chloride 0.9 % 500 mL chemo infusion
25 | Freq: Once | INTRAVENOUS | Status: AC
Start: 2022-01-02 — End: 2022-01-02
  Administered 2022-01-02: 16:00:00 150 mg/m2 via INTRAVENOUS

## 2022-01-02 MED ORDER — furosemide (LASIX) injection 20 mg
10 | Freq: Once | INTRAMUSCULAR | Status: AC
Start: 2022-01-02 — End: 2022-01-02
  Administered 2022-01-02: 14:00:00 20 mg via INTRAVENOUS

## 2022-01-02 MED ORDER — potassium chloride 20 mEq, magnesium sulfate 1 g in sodium chloride 0.45% (1/2 NS) 1,000 mL IV infusion
2 | INTRAVENOUS | Status: AC
Start: 2022-01-02 — End: 2022-01-02
  Administered 2022-01-02: 12:00:00 500 mL/h via INTRAVENOUS

## 2022-01-02 MED ORDER — dexAMETHasone (DECADRON) tablet 12 mg
4 | Freq: Once | ORAL | Status: AC
Start: 2022-01-02 — End: 2022-01-02
  Administered 2022-01-02: 14:00:00 12 mg via ORAL

## 2022-01-02 MED ORDER — heparin lock flush 100 unit/mL syringe 500 Units
100 | Freq: Every day | INTRAVENOUS | PRN
Start: 2022-01-02 — End: 2022-01-02
  Administered 2022-01-02: 20:00:00 500 [IU]

## 2022-01-02 MED ORDER — palonosetron (ALOXI) injection 0.25 mg
0.25 | Freq: Once | INTRAVENOUS | Status: AC
Start: 2022-01-02 — End: 2022-01-02
  Administered 2022-01-02: 14:00:00 0.25 mg via INTRAVENOUS

## 2022-01-02 MED FILL — DEXAMETHASONE 4 MG TABLET: 4 4 MG | ORAL | Qty: 3

## 2022-01-02 MED FILL — PALONOSETRON 0.25 MG/5 ML INTRAVENOUS SOLUTION: 0.25 0.25 mg/5 mL | INTRAVENOUS | Qty: 5

## 2022-01-02 MED FILL — FUROSEMIDE 10 MG/ML INJECTION SOLUTION: 10 10 mg/mL | INTRAMUSCULAR | Qty: 2

## 2022-01-02 MED FILL — POTASSIUM CHLORIDE 2 MEQ/ML INTRAVENOUS SOLUTION: 2 2 mEq/mL | INTRAVENOUS | Qty: 10

## 2022-01-02 MED FILL — PEMETREXED DISODIUM 100 MG INTRAVENOUS POWDER FOR SOLUTION: 100 100 mg | INTRAVENOUS | Qty: 40

## 2022-01-02 MED FILL — CISPLATIN 1 MG/ML INTRAVENOUS SOLUTION: 1 1 mg/mL | INTRAVENOUS | Qty: 150

## 2022-01-02 MED FILL — SODIUM CHLORIDE 0.45 % INTRAVENOUS SOLUTION: 0.45 0.45 % | INTRAVENOUS | Qty: 1000

## 2022-01-02 MED FILL — CINVANTI 7.2 MG/ML INTRAVENOUS EMULSION: 7.2 7.2 mg/mL | INTRAVENOUS | Qty: 18

## 2022-01-02 NOTE — Unmapped (Signed)
Patient arrived for first tx. Port accessed per sterile procedure with noted blood return. Labs collected and sent for processing.    Reviewed all current medications and allergies. Dr Zerita Boers aware that patient did not take dex yesterday. Patient did receive B12 injection and has been taking the folic acid.    0821 pre hydration infusion started per [facility]. Patient aware to notify nurse of any noted side effects.  1016 hydration infusion completed.    1016 pre medication given as ordered per MAR.    1049 Alimta infusion started per [facility]. Patient aware to notify nurse of any noted side effects.  1100 infusion completed with no side effects.    Patient aware of 30 minute wait time before starting next infusion.     1137 cisplatin infusion started per [facility]. Patient aware to notify nurse of any noted side effects.  1341 infusion completed.     1348 post hydration infusion started.  1549 hydration completed. Port flushed,hep locked and left accessed for tomorrows infusion.  AVS printed and patient d/c in baseline condition.

## 2022-01-02 NOTE — Unmapped (Signed)
8/25  Patient scheduled for infusion at [facility] this weekend. Let patient know.     Chart reviewed for ongoing navigation follow up.    Schedule as noted below. Will continue to follow and assist as needed.   Future Appointments   Date Time Provider Department Center   01/03/2022 10:00 AM CHAIR 2 INF [facility] INF [facility]   01/04/2022  8:30 AM CHAIR 5 INF [facility] IN[facility]   01/23/2022  9:30 AM Hildred Priest, MD [facility]   01/26/2022  7:30 AM INFUSION NURSE 1 [facility] INF [facility]   01/27/2022 10:30 AM INFUSION NURSE 4 [facility] INF [facility]   01/28/2022 10:30 AM INFUSION NURSE 4 [facility] INF [facility]   02/13/2022  8:00 AM Zollie Pee, Au.D. [facility]   04/16/2022 11:30 AM Ivery Quale, MD [facility] PULM [facility]

## 2022-01-02 NOTE — Unmapped (Signed)
Subject ID: [id number]  Protocol Number: [id number]  Treatment Group: NA  Study Visit: Baseline  Treating MD: Dr. Leeroy Bock. Zerita Boers    Research Generic Note    CRC met with patient today for baseline blood collection and to complete baseline questionnaires.  Blood was collected and shipped per protocol today.  Patient completed questionnaires without assistance.  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  Patient was reminded that sub-study screening would start closer to when current adjuvant treatment ends.  Patient verbalized understanding and had no questions at this time.  Contact information provided to the patient and instructed to call CRC with any new questions or concerns.    Gurtej Noyola MARIE [name]

## 2022-01-03 ENCOUNTER — Ambulatory Visit: Admit: 2022-01-03 | Discharge: 2022-01-03 | Payer: MEDICARE

## 2022-01-03 DIAGNOSIS — C3412 Malignant neoplasm of upper lobe, left bronchus or lung: Secondary | ICD-10-CM

## 2022-01-03 MED ORDER — sodium chloride 0.9 % infusion
Freq: Every day | INTRAVENOUS | PRN
Start: 2022-01-03 — End: 2022-01-03

## 2022-01-03 MED ORDER — potassium chloride 20 mEq, magnesium sulfate 1 g in sodium chloride 0.45% (1/2 NS) 1,000 mL IV infusion
2 | INTRAVENOUS
Start: 2022-01-03 — End: 2022-01-03

## 2022-01-03 MED ORDER — sodium chloride 0.9% flush 10 mL
Freq: Every day | INTRAMUSCULAR | PRN
Start: 2022-01-03 — End: 2022-01-03

## 2022-01-03 MED ORDER — heparin lock flush 100 unit/mL syringe 500 Units
100 | Freq: Every day | INTRAVENOUS | PRN
Start: 2022-01-03 — End: 2022-01-03

## 2022-01-03 MED ORDER — potassium chloride 20 mEq, magnesium sulfate 1 g in sodium chloride 0.45% (1/2 NS) 1,000 mL IV infusion
2 | INTRAVENOUS | Status: AC
Start: 2022-01-03 — End: 2022-01-03
  Administered 2022-01-03: 14:00:00 500 mL/h via INTRAVENOUS

## 2022-01-03 MED FILL — SODIUM CHLORIDE 0.45 % INTRAVENOUS SOLUTION: 0.45 0.45 % | INTRAVENOUS | Qty: 1000

## 2022-01-03 MED FILL — SODIUM CHLORIDE 0.9 % INTRAVENOUS SOLUTION: 25.00 25.00 mL/hr | INTRAVENOUS | Qty: 1000

## 2022-01-03 MED FILL — HEPARIN, PORCINE (PF) 100 UNIT/ML INTRAVENOUS SYRINGE: 100 100 unit/mL | INTRAVENOUS | Qty: 5

## 2022-01-03 MED FILL — MONOJECT 0.9% SODIUM CHLORIDE INJECTION SYRINGE: 10.00 10.00 mL | INTRAMUSCULAR | Qty: 10

## 2022-01-03 NOTE — Unmapped (Signed)
D2 C1 Hydration of Kcl, Mg, & NaCl given over 2 hours w/o complications; patient arrived ambulatory w/ family member; VSS; port remained accessed from 01/02/22; dressing intact; flushed w/ good blood return; patient d/c to home ambulatory; declined AVS.    Ollen Bowl

## 2022-01-04 ENCOUNTER — Ambulatory Visit: Admit: 2022-01-04 | Discharge: 2022-01-04 | Payer: MEDICARE

## 2022-01-04 DIAGNOSIS — C3412 Malignant neoplasm of upper lobe, left bronchus or lung: Secondary | ICD-10-CM

## 2022-01-04 MED ORDER — sodium chloride 0.9 % infusion
Freq: Every day | INTRAVENOUS | PRN
Start: 2022-01-04 — End: 2022-01-04

## 2022-01-04 MED ORDER — sodium chloride 0.9% flush 10 mL
Freq: Every day | INTRAMUSCULAR | PRN
Start: 2022-01-04 — End: 2022-01-04

## 2022-01-04 MED ORDER — heparin lock flush 100 unit/mL syringe 500 Units
100 | Freq: Every day | INTRAVENOUS | PRN
Start: 2022-01-04 — End: 2022-01-04
  Administered 2022-01-04: 16:00:00 500 [IU]

## 2022-01-04 MED ORDER — potassium chloride 20 mEq, magnesium sulfate 1 g in sodium chloride 0.45% (1/2 NS) 1,000 mL IV infusion
2 | INTRAVENOUS
Start: 2022-01-04 — End: 2022-01-04

## 2022-01-04 MED ORDER — potassium chloride 20 mEq, magnesium sulfate 1 g in sodium chloride 0.45% (1/2 NS) 1,000 mL IV infusion
2 | INTRAVENOUS | Status: AC
Start: 2022-01-04 — End: 2022-01-04
  Administered 2022-01-04: 14:00:00 500 mL/h via INTRAVENOUS

## 2022-01-04 MED ORDER — lisinopriL (PRINIVIL) tablet 10 mg
10 | Freq: Once | ORAL | Status: AC
Start: 2022-01-04 — End: 2022-01-04
  Administered 2022-01-04: 14:00:00 10 mg via ORAL

## 2022-01-04 MED FILL — MONOJECT 0.9% SODIUM CHLORIDE INJECTION SYRINGE: 10.00 10.00 mL | INTRAMUSCULAR | Qty: 10

## 2022-01-04 MED FILL — HEPARIN, PORCINE (PF) 100 UNIT/ML INTRAVENOUS SYRINGE: 100 100 unit/mL | INTRAVENOUS | Qty: 5

## 2022-01-04 MED FILL — LISINOPRIL 10 MG TABLET: 10 10 MG | ORAL | Qty: 1

## 2022-01-04 MED FILL — SODIUM CHLORIDE 0.45 % INTRAVENOUS SOLUTION: 0.45 0.45 % | INTRAVENOUS | Qty: 1000

## 2022-01-04 MED FILL — SODIUM CHLORIDE 0.9 % INTRAVENOUS SOLUTION: 25.00 25.00 mL/hr | INTRAVENOUS | Qty: 1000

## 2022-01-04 NOTE — Unmapped (Addendum)
01/04/22 0837 01/04/22 0843 01/04/22 0936   Vital Signs   BP (!) 167/92 (!) 179/95 169/80      01/04/22 1055   Vital Signs   BP 166/88     Patient arrived ambulatory this day w/ daughter present; patient continues to complain of headache and nausea; instructed patient to take her prescribed dexamethasone and PRN compazine; Dr. Herby Abraham gave new orders for Lisinipril 10mg  x1 dose; patient continues w/ elevated BP and Dr. Zerita Boers and Dr. Ruel Favors made aware w/ no further orders.  Headache and nausea have dissipated.  Patient received x2 hour hydration this day w/o complications; port w/ good blood return, flushed w/ heparin and deaccessed; AVS printed; patient d/c ambulatory to home w/ daughter.  Patient advised to call MD office if HA and or nausea continue.

## 2022-01-06 ENCOUNTER — Ambulatory Visit: Admit: 2022-01-06 | Payer: MEDICARE

## 2022-01-06 DIAGNOSIS — C3412 Malignant neoplasm of upper lobe, left bronchus or lung: Secondary | ICD-10-CM

## 2022-01-06 MED ORDER — sodium chloride 0.9% flush 10 mL
Freq: Once | INTRAMUSCULAR | Status: AC
Start: 2022-01-06 — End: 2022-01-06
  Administered 2022-01-06: 21:00:00 10 mL

## 2022-01-06 MED ORDER — sodium chloride 0.9 % infusion 1,000 mL
Freq: Once | INTRAVENOUS | Status: AC
Start: 2022-01-06 — End: 2022-01-07

## 2022-01-06 MED ORDER — ondansetron (ZOFRAN) injection 4 mg
4 | Freq: Once | INTRAMUSCULAR | Status: AC
Start: 2022-01-06 — End: 2022-01-06
  Administered 2022-01-06: 21:00:00 4 mg via INTRAVENOUS

## 2022-01-06 MED ORDER — ondansetron (ZOFRAN) 8 MG tablet
8 | ORAL_TABLET | Freq: Three times a day (TID) | ORAL | 1 refills | 7.00000 days | Status: AC | PRN
Start: 2022-01-06 — End: 2022-01-23

## 2022-01-06 MED ORDER — OLANZapine (ZYPREXA) 5 MG tablet
5 | ORAL_TABLET | Freq: Every evening | ORAL | 2 refills | 30.00000 days | Status: AC
Start: 2022-01-06 — End: 2022-01-23

## 2022-01-06 MED ORDER — sodium chloride 0.9 % infusion
Freq: Once | INTRAVENOUS | Status: AC
Start: 2022-01-06 — End: 2022-01-06
  Administered 2022-01-06: 20:00:00 999 mL/h via INTRAVENOUS

## 2022-01-06 MED ORDER — heparin lock flush 100 unit/mL syringe 500 Units
100 | Freq: Once | INTRAVENOUS | Status: AC
Start: 2022-01-06 — End: 2022-01-06
  Administered 2022-01-06: 21:00:00 500 [IU]

## 2022-01-06 MED FILL — ONDANSETRON HCL (PF) 4 MG/2 ML INJECTION SOLUTION: 4 4 mg/2 mL | INTRAMUSCULAR | Qty: 2

## 2022-01-06 NOTE — Unmapped (Signed)
This RN called Alace patient's daughter to update on Appt. Per Anice Paganini RN, patient is to come in at 230pm to see Dow Adolph the urgent NP. Possible infusion to follow. Alace verbalized understanding.      Jadene Pierini

## 2022-01-06 NOTE — Unmapped (Addendum)
[facility]    Patient is calling the nurse triage line with her daughter Alace. She stated that patient has not been feeling well since receiving her chemo therapy treatment on Friday. She stated that immediately after receiving chemo patient had a headache and felt nauseous. Patient continues to have a headache on and off and nausea. Patient takes compazine every 6 hours and stated that it may last for 3 hours and then she Is back feeling nauseous. She stated that her last BM was Friday. She does feel bloated and denied passing gas. Abdomen per daughter looks slightly distended but no abdominal pain. She has had 1 episode of emesis within the last 2 days. Patient has not been eating or drinking much due to nausea. She stated that I am only able to drink small sips of water and I am lucky if I can even get 1 small glass of water in a day. Patient has tried to eat crackers and not able to tolerate due to nausea. Daughter stated that patient is very weak and shaky. She does feel light headed and dizzy with ambulation. Patient has dry mouth and dry lips. She has been urinating.   Patient has a history of hypertension BP Today 155/85  Takes Lisinopril 10mg  daily, daughter wants to know if provider thinks it would be beneficial to increase dosage of BP meds during chemotherapy treatment.      Patient requesting to be prescribed another medication for Nausea, patient has miralax at home for constipation but asking if she can be prescribed a tablet instead since she can't drink much.     I advised I will send to care team as provided may suggest patient coming in for IV Hydration due to her not being able to tolerate fluids by mouth. Patient verbalized understanding.         Jadene Pieriniasha Sahan Pen

## 2022-01-06 NOTE — Unmapped (Signed)
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)  Staging form: Lung, AJCC 8th Edition  - Clinical: No stage assigned - Unsigned  - Pathologic stage from 12/11/2016: Stage IA2 (pT1b, pN0, cM0) - Signed by Janett Billow, MD on 12/11/2016    Malignant neoplasm of upper lobe of left lung (CMS-HCC)  Staging form: Lung, AJCC 8th Edition  - Pathologic stage from 12/04/2021: Stage IIB (pT1a, pN1, cM0) - Signed by Janett Billow, MD on 12/04/2021      GENOMICS:  EGFR mt negative  PD-L1 95%  Other genomics pending    SPECIAL MEDICAL ISSUES: 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 11/18/21: Left VATS lingulectomy     ECOG PERFORMANCE STATUS: (1) Restricted in physically strenuous activity but ambulatory and able to carry out work of a light or sedentary nature, e.g., light house work, office work        HPI:   URGENT VISIT 01/06/22:  The patient is being seen today for an urgent visit due to nausea/vomiting and fatigue.  She got her first infusion of Cisplatin Pemetrexed on Friday 01/02/22. She started having nausea and has thrown up twice (once yesterday and once today).  She reports eating but only minimally and cannot keep most foods down, including only a few sips of water.   She denies abdominal pain/tenderness, cramping, and distension.  She endorses some constipation over the last few days but took some Milk of Magnesium and had a somewhat loose bowel movement this morning.   She denies shortness of breath and feelings of racing heart - upon auscultation lung and heart sounds are normal.   No  fever today but patient endorses feeling some hot flashes and diaphoresis coming and going.  She takes Compazine 10 mg every 6 hours, but states it does not help much.  Got fluids for hydration 8/26 and 8/27 and will today as well.  After discussion it was discovered there was an error on their Decadron label, and she took Decadron the day before infusion but then didn't take it again until 2-3 days after infusion.        PAST MEDICAL HISTORY:  She has a past medical history of Anxiety, Basal cell cancer, Cataract, Hyperlipidemia, Hypertension, Lung nodule, OSA (obstructive sleep apnea), RLS (restless legs syndrome), and Stroke (CMS-HCC).    PAST SURGICAL HISTORY:  She has a past surgical history that includes Appendectomy; submastoid gland removal; Uvulopalatopharyngoplasty; Tonsillectomy; Colonoscopy (N/A, 01/23/2013); Flexible bronchoscopy w/ upper endoscopy (N/A, 09/17/2016); Thoracoscopy (Right, 11/23/2016); Loop Implant  Implantation (N/A, 04/07/2017); Loop Removal (Left, 04/09/2021); Cataract extraction, bilateral (Bilateral, 07/2021); thoracoscopy lobectomy video assisted (Left, 11/18/2021); and insertion catheter vascular access (N/A, 12/26/2021).    MEDICATIONS:  Current Outpatient Medications   Medication Sig    aspirin Take 1 tablet (325 mg total) by mouth daily.    aspirin-acetaminophen-caffeine Take 1 tablet by mouth every 6 hours as needed for Pain.    atorvastatin Take 1 tablet (80 mg total) by  mouth daily.    calcium carbonate (CALCIUM 300 ORAL) Take by mouth.    dexAMETHasone Take 4 mg twice a day for five days starting the day before pemetrexed.    diclofenac 1 drop if needed (ear itching). ears    folic acid Take 1 tablet (1 mg total) by mouth daily. Start 1 week before first cycle and continue for 21 days after the last dose of pemetrexed.    lidocaine-prilocaine Apply topically if needed.    lipase/protease/amylase (CREON ORAL) Take 1 capsule by mouth if needed (stomach pain).    lisinopriL Take  1 tablet (10 mg total) by mouth daily. 10 mg (half tablet) daily    MELATONIN ORAL Take 3-5 mg by mouth at bedtime. PRN for sleep    multivitamin Take 1 tablet by mouth daily.    naloxone Apply 1 spray in one nostril if needed. Call 911. May repeat dose in other nostril if no response in 3 minutes.    OLANZapine Take 1 tablet (5 mg total) by mouth at bedtime.    ondansetron Take 1 tablet (8 mg total) by mouth every 8 hours as needed for Nausea.    PARoxetine Take 1 tablet (20 mg total) by mouth daily.    proCHLORPERazine Take 1 tablet (10 mg total) by mouth every 6 hours as needed (For nausea and vomiting).    roPINIRole Take 1 tablet (0.5 mg total) by mouth daily.     Current Facility-Administered Medications   Medication Dose Frequency Provider Last Admin    sodium chloride 0.9 %  1,000 mL Once Hildred Priest, MD         ALLERGIES:    Penicillins, Adhesive, Formaldehyde analogues, Nsaids (non-steroidal anti-inflammatory drug), Promethazine hcl, Latex, and Sulfa (sulfonamide antibiotics)    FAMILY HISTORY:   Her family history includes Diabetes in her son. She was adopted.    SOCIAL HISTORY:    She reports that she has never smoked. She has never used smokeless tobacco. She reports current alcohol use of about 2.0 standard drinks per week. She reports that she does not use drugs.     REVIEW OF SYSTEMS:  Review of Systems   Constitutional:  Positive for diaphoresis and malaise/fatigue. Negative for chills and fever.   HENT:  Negative for hearing loss and tinnitus.    Eyes:  Negative for blurred vision and double vision.   Respiratory:  Negative for cough, hemoptysis and shortness of breath.    Cardiovascular:  Negative for chest pain.   Gastrointestinal:  Positive for nausea and vomiting. Negative for abdominal pain.   Genitourinary:  Negative for dysuria and flank pain.   Musculoskeletal:  Negative for joint pain.   Skin:  Negative for itching and rash.   Neurological:  Negative for headaches.    Psychiatric/Behavioral:  Negative for substance abuse and suicidal ideas.      PHYSICAL EXAMINATION:  Vitals:    01/06/22 1444   BP: (!) 135/93   BP Location: Right upper arm   Pulse: 89   Resp: 16   Temp: 98.8 F (37.1 C)   TempSrc: Temporal   SpO2: 95%   Weight: 183 lb (83 kg)   Height: 5' 6 (1.676 m)    Body surface area is 1.97 meters squared.    Physical Exam  Constitutional:       Appearance: Normal appearance.   HENT:      Head: Normocephalic and atraumatic.      Nose: Nose normal.   Eyes:  Conjunctiva/sclera: Conjunctivae normal.   Cardiovascular:      Rate and Rhythm: Normal rate.      Heart sounds: Normal heart sounds.   Pulmonary:      Effort: Pulmonary effort is normal.      Breath sounds: Normal breath sounds.   Abdominal:      General: There is no distension.      Tenderness: There is no abdominal tenderness.   Musculoskeletal:      Cervical back: Normal range of motion.      Comments: Wheelchair bound   Skin:     General: Skin is warm and dry.   Neurological:      General: No focal deficit present.      Mental Status: She is alert and oriented to person, place, and time.   Psychiatric:         Mood and Affect: Mood normal.         Behavior: Behavior normal.        LABORATORY DATA:  Lab Results   Component Value Date    WBC 8.6 01/02/2022    RBC 4.22 01/02/2022    HGB 13.1 01/02/2022    HCT 38.8 01/02/2022    MCV 91.8 01/02/2022    MCH 31.1 01/02/2022    MCHC 33.9 01/02/2022    RDW 13.9 01/02/2022    PLT 187 01/02/2022        Lab Results   Component Value Date    GLUCOSE 104 (H) 01/02/2022    BUN 19 01/02/2022    CO2 26 01/02/2022    CREATININE 1.02 01/02/2022    K 4.1 01/02/2022    NA 140 01/02/2022    CL 106 01/02/2022    CALCIUM 9.3 01/02/2022    ALBUMIN 4.0 01/02/2022    PROT 6.4 01/02/2022    ALKPHOS 59 01/02/2022    ALT 15 01/02/2022    AST 17 01/02/2022    BILITOT 0.5 01/02/2022     No results found for: TSH, T3TOTAL, T4TOTAL, T3FREE, FREET4, THYROIDAB     RADIOLOGY REVIEW:  Imaging  results personally reviewed and compared to priors  X-ray Portable Chest    Result Date: 12/26/2021  IMPRESSION: Status post placement of right internal jugular approach port catheter with tip overlying cavoatrial junction. No pneumothorax. Clear lungs. Report Verified by: Odie Sera, MD at 12/26/2021 11:05 AM EDT    Fluoro guide Cen Ven access dev pl S-I    Result Date: 12/26/2021  IMPRESSION: Intraoperative fluoroscopy was performed. Correlate with procedural note for additional information. Report Verified by: Blenda Peals, MD at 12/26/2021 10:20 AM EDT     PATHOLOGY REVIEW:  11/18/21   FINAL DIAGNOSIS:     A.   Lymph nodes, level 11, excision:   - Metastatic carcinoma (0.7 cm), involving one of six lymph nodes (1/6).   - No evidence of extranodal extension.     B.   Lung, left, lingulectomy:   - Invasive poorly differentiated adenocarcinoma, acinar predominant,   measuring 1.0 cm. See comment and case summary.   - All margins are negative for carcinoma.   - Lymph-vascular space invasion and spread through airspaces (STAS) are   present.   - No evidence of visceral pleural invasion.   - Background lung parenchyma with chronic bronchiolitis.   - Three hilar lymph nodes, negative for carcinoma (0/3).   - Pathologic Stage (8th Edition AJCC): pT1aN1.     C.   Lymph nodes, level 5, excision:   -  Two lymph nodes, negative for carcinoma (0/2).     Comment: The tumor cells are positive for TTF-1 and Napsin-A supporting a   diagnosis of pulmonary adenocarcinoma.  Prospective intradepartmental   consultation is obtained.     PD-L1 (sp263) immunostain and EGFR molecular testing are pending and will   be resulted in an addendum.     CASE SUMMARY: (LUNG)   Standard(s): AJCC-UICC 8     SPECIMEN     Synchronous Tumors   Not applicable     Procedure   Other (specify): Lingulectomy     Specimen Laterality   Left     TUMOR     Tumor Focality   Single focus     Tumor Site   Other (specify): Lingula     Tumor Size   Total Tumor Size    Greatest dimension in Centimeters (cm): 1.0 cm   Size of Invasive Component    Greatest dimension in Centimeters (cm): 1.0 cm     Histologic Type   Invasive acinar adenocarcinoma   Histologic Patterns Present   Acinar: 60%   Papillary: 15%   Micropapillary: 20%   Solid/complex glands: 5%     Histologic Grade   G3, poorly differentiated     Spread Through Retail banker (STAS)   Present     Visceral Pleura Invasion   Not identified     Direct Invasion of Adjacent Structures   Not applicable (no adjacent structures present)     Treatment Effect   No known presurgical therapy     Lymphovascular Invasion   Lymphatic invasion present     MARGINS   Margin Status for Invasive Carcinoma   All margins negative for invasive carcinoma   Closest Margin(s) to Invasive Carcinoma   Bronchial   Vascular   Parenchymal   Distance from Invasive Carcinoma to Closest Margin   Exact distance: 2.0 cm   Margin Status for Non-Invasive Tumor  (select all that apply)   All margins negative for non-invasive tumor     REGIONAL LYMPH NODES     Lymph Node(s) from Prior Procedures   No known prior lymph node sampling performed     Regional Lymph Node Status   Regional lymph nodes present   Tumor present in regional lymph node(s)   Number of Lymph Nodes with Tumor   Exact number (specify): 1   Nodal Site(s) with Tumor   11L: Interlobar   Extranodal Extension   Not identified   Number of Lymph Nodes Examined   Exact number (specify): 11   Nodal Site(s) Examined   5: Subaortic / aortopulmonary (AP) / AP window   7: Hilar   11L: Interlobar     EGFR GENOTYPE RESULTS ON [id number],  BLOCK B3. 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  - Patient will start taking Zyprexa tonight and take Zofran every 8 hours as needed.  Educated to contact office if this does not suffice.   - Discussion had about appropriate Decadron usage before, during and after infusion and they will comply at next infusion.  - Contact office if symptoms worsen and/or fever arises.  Return to clinic prior to second treatment.

## 2022-01-06 NOTE — Unmapped (Signed)
1555- Patient here for hydration.  Port accessed per sterile procedure by Gwynneth Aliment., RN with blood return noted.   1557-Port checked, blood return noted. 0.9% NS hydration hung on pump, and started per plan.   1646-Pt c/o nausea, requested nausea medication. Secure message sent to Janene Harvey, Urgent NP. Orders received and Zofran given IV per order-See MAR.   1705- Treatment complete, pt tolerated well. Port flushed and heparinized, access removed with tip intact. Pt discharged to home with family in baseline condition.

## 2022-01-06 NOTE — Unmapped (Signed)
This RN called and spoke with Alace and patient. Patient stated that she has had 1 small BM since her phone call. She Stated that it was formed and minimal straining, no blood noted. She stated that she feels some relief but continues to feel nauseous so she did take compazine around 1030am. I advised per Adine Madura CNP she would like the patient to take MOM 2 tablets now, since she has had a BM and can repeat in 4 hours if needed.    She has prescribed Zyprexa 5mg  to take at bedtime and Zofran 8mg  to take every 8 hours as needed for nausea. Patient is able to take Zofran until 4-5 days after receiving her chemo therapy due to anti-emetic she receives the day of her chemo treatment. I also advised Zofran can cause constipation as well so it is important to monitor bowels.   Misty Stanley also recommended starting Senna- S OTC daily.   BP medications will stay the same at this time.     Patient stated that she would still like to come in for IV hydration and visit with the provider. I advised that they can get patient in around 3pm, and Alace stated that this would be fine. Instructed not to transport patient if she does not feel comfortable or stable to walk to the car. Alace verbalized understanding and will call with any additional questions/concerns.       Jadene Pierini

## 2022-01-16 NOTE — Unmapped (Signed)
GENOMIC TESTING  [facility] testing cancelled due to EGFR Gene Mutation testing performed by [facility].  Jennifer L. Katrinka Blazing, BSN, RN  Clinic Nurse for Dr. Meryl Dare of [facility]  [phone]

## 2022-01-23 ENCOUNTER — Encounter

## 2022-01-23 ENCOUNTER — Ambulatory Visit: Admit: 2022-01-23 | Discharge: 2022-01-29 | Payer: MEDICARE

## 2022-01-23 DIAGNOSIS — C3412 Malignant neoplasm of upper lobe, left bronchus or lung: Secondary | ICD-10-CM

## 2022-01-23 MED ORDER — OLANZapine (ZYPREXA) 10 MG tablet
10 | ORAL_TABLET | Freq: Every evening | ORAL | 3 refills | Status: AC
Start: 2022-01-23 — End: ?

## 2022-01-23 MED ORDER — ondansetron (ZOFRAN) 8 MG tablet
8 | ORAL_TABLET | Freq: Three times a day (TID) | ORAL | 3 refills | PRN
Start: 2022-01-23 — End: 2022-03-27

## 2022-01-23 NOTE — Unmapped (Signed)
9/15  Met with patient while at appointment with Dr Zerita Boers. Plan for cycle 2 on Monday. Patient doing well.    Pt denies any questions or needs at this time. Encouraged to call with needs, questions or concerns. Will continue to follow.     Future Appointments   Date Time Provider Department Center   01/26/2022  7:30 AM INFUSION NURSE 1 [facility] INF [facility]   01/27/2022 10:30 AM INFUSION NURSE 4 [facility] 202 [facility] INF [facility]   01/28/2022 10:30 AM INFUSION NURSE 4 [facility] 202 [facility] INF [facility]   02/13/2022  8:00 AM Zollie Pee, [facility]   02/13/2022  9:10 AM Hildred Priest, MD [facility]   02/16/2022  7:30 AM INFUSION NURSE 1 [facility] 202 [facility] INF [facility]   02/17/2022  9:30 AM INFUSION NURSE 4 [facility] 202 [facility] INF [facility]   02/18/2022  9:30 AM INFUSION NURSE 4 [facility]   03/06/2022  9:00 AM INFUSION NURSE 2 [facility] 202 [facility] INF [facility]   03/09/2022  7:30 AM INFUSION NURSE 1 [facility] 202 [facility] INF [facility]   03/10/2022  9:30 AM INFUSION NURSE 4 [facility] 202 [facility] INF [facility]   03/11/2022  9:30 AM INFUSION NURSE 4 [facility] 202 [facility] INF [facility]   04/16/2022 11:30 AM Ivery Quale, MD [facility]

## 2022-01-23 NOTE — Unmapped (Signed)
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 11/18/21: Left VATS lingulectomy  2. 01/02/22: Adjuvant cisplatin, pemetrexed (C2 dose reduced due to intolerance - n/v/fatigue)    ECOG PERFORMANCE STATUS: (1) Restricted in physically strenuous activity but ambulatory and able to carry out work of a light or sedentary nature, e.g., light house work, office work      INTERVAL HISTORY: Joanna Lopez is here for follow up of her lung cancer with her daughter. She is improved now since her urgent visit and is closer to normal again. There was an error on her dexamethasone and folic acid prescriptions through the pharmacy and these were both filled incorrectly and with incorrect instructions. Updated prescriptions have been made to remedy this. Nausea is improved, energy is returning. She was unable to shower for several days as well and likely as a result developed an UTI and yeast infection that is resolving. Breathing is stable and no other complaints.      MEDICATIONS:  Current Outpatient Medications   Medication Sig    aspirin Take 1 tablet (325 mg total) by mouth daily.    aspirin-acetaminophen-caffeine Take 1 tablet by mouth every 6 hours as needed for Pain.    atorvastatin Take 1 tablet (80 mg total) by mouth daily.    calcium carbonate (CALCIUM 300 ORAL) Take by mouth.    dexAMETHasone Take 4 mg twice a day for five days starting the day before pemetrexed.    folic acid Take 1 tablet (1 mg total) by mouth daily. Start 1 week before first cycle and continue for 21 days after the last dose of pemetrexed.    lidocaine-prilocaine Apply topically if needed.    lipase/protease/amylase (CREON ORAL) Take 1 capsule by mouth if needed (stomach pain).    lisinopriL Take 1 tablet (10 mg total) by mouth daily. 10 mg (half tablet) daily    MELATONIN ORAL Take 3-5 mg by mouth at bedtime. PRN for sleep    multivitamin Take 1 tablet by mouth daily.    naloxone Apply 1 spray in one nostril if needed. Call 911. May repeat dose in other nostril if no response in 3 minutes.    OLANZapine Take 1 tablet (10 mg total) by mouth at bedtime. Indications: Prevention of Chemotherapy-Induced Nausea and Vomiting    ondansetron Take 1 tablet (8 mg total) by mouth every 8 hours as  needed for Nausea.    PARoxetine Take 1 tablet (20 mg total) by mouth daily.    proCHLORPERazine Take 1 tablet (10 mg total) by mouth every 6 hours as needed (For nausea and vomiting).    roPINIRole Take 1 tablet (0.5 mg total) by mouth daily.    diclofenac 1 drop if needed (ear itching). ears     No current facility-administered medications for this visit.     Facility-Administered Medications Ordered in Other Visits   Medication Dose Frequency Provider Last Admin    heparin lock flush  500 Units Daily PRN Hildred Priest, MD      sodium chloride 0.9 %  25 mL/hr Daily PRN Hildred Priest, MD      sodium chloride 0.9%  10 mL Daily PRN Hildred Priest, MD         ALLERGIES:    Penicillins, Adhesive,  Formaldehyde analogues, Nsaids (non-steroidal anti-inflammatory drug), Promethazine hcl, Latex, and Sulfa (sulfonamide antibiotics)       REVIEW OF SYSTEMS:  Review of Systems   Constitutional:  Positive for malaise/fatigue and weight loss. Negative for chills, diaphoresis and fever.   HENT:  Negative for congestion, hearing loss, sore throat and tinnitus.    Eyes:  Negative for blurred vision and double vision.   Respiratory:  Negative for cough, hemoptysis and shortness of breath.    Cardiovascular:  Negative for chest pain, palpitations and leg swelling.   Gastrointestinal:  Positive for nausea and vomiting. Negative for abdominal pain and diarrhea.   Genitourinary:  Positive for dysuria. Negative for flank pain and urgency.   Musculoskeletal:  Negative for joint pain, myalgias and neck pain.   Skin:  Negative for itching and rash.   Neurological:  Positive for weakness. Negative for dizziness and headaches.   Psychiatric/Behavioral:  Negative for depression, substance abuse and suicidal ideas. The patient is not nervous/anxious.    All other systems reviewed and are negative.    PHYSICAL EXAMINATION:  Vitals:    01/23/22 0858   BP: (!) 136/94   BP Location: Left upper arm   Pulse: 87   Resp: 16   Temp: 98.2 F (36.8 C)   TempSrc: Temporal   SpO2: 94%   Weight: 182 lb 9.6 oz (82.8 kg)   Height: 5' 6.5 (1.689 m)    Body surface area is 1.97 meters squared.    Physical Exam  Constitutional:       General: She is not in acute distress.     Appearance: Normal appearance. She is normal weight. She is not ill-appearing.   HENT:      Head: Normocephalic and atraumatic.      Nose: Nose normal.      Mouth/Throat:      Mouth: Mucous membranes are moist.      Pharynx: Oropharynx is clear.   Eyes:      General: No scleral icterus.     Extraocular Movements: Extraocular movements intact.      Conjunctiva/sclera: Conjunctivae normal.   Cardiovascular:      Rate and Rhythm: Normal rate.      Pulses: Normal pulses.    Pulmonary:      Effort: Pulmonary effort is normal.   Abdominal:      General: Abdomen is flat. There is no distension.   Musculoskeletal:         General: No swelling or tenderness. Normal range of motion.      Cervical back: Normal range of motion.   Skin:  General: Skin is warm and dry.      Coloration: Skin is not jaundiced.      Findings: No rash.   Neurological:      General: No focal deficit present.      Mental Status: She is alert and oriented to person, place, and time. Mental status is at baseline.      Cranial Nerves: No cranial nerve deficit.      Gait: Gait normal.   Psychiatric:         Mood and Affect: Mood normal.         Behavior: Behavior normal.         Thought Content: Thought content normal.        LABORATORY DATA:  Lab Results   Component Value Date    WBC 9.7 01/26/2022    RBC 3.78 (L) 01/26/2022    HGB 11.9 01/26/2022    HCT 35.1 01/26/2022    MCV 92.8 01/26/2022    MCH 31.5 01/26/2022    MCHC 33.9 01/26/2022    RDW 14.1 01/26/2022    PLT 340 01/26/2022        Lab Results   Component Value Date    GLUCOSE 142 (H) 01/26/2022    BUN 29 (H) 01/26/2022    CO2 23 01/26/2022    CREATININE 1.22 01/26/2022    K 4.2 01/26/2022    NA 138 01/26/2022    CL 106 01/26/2022    CALCIUM 9.4 01/26/2022    ALBUMIN 4.0 01/26/2022    PROT 6.9 01/26/2022    ALKPHOS 65 01/26/2022    ALT 19 01/26/2022    AST 16 01/26/2022    BILITOT 0.4 01/26/2022     No results found for: TSH, T3TOTAL, T4TOTAL, T3FREE, FREET4, THYROIDAB     RADIOLOGY REVIEW:  No new imaging      ASSESSMENT AND PLAN:  1) 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 This has been corrected now. Labs reviewed showing slight decrease in eGFR likely due to dehydration from the prior cycle. Now  clinically appropriate to continue treatment and this is what the patient favors as well. Will plan for dose reduction of cisplatin and pemetrexed both.  - Refilled zofran and olanzapine today for nausea control. Discussed to take olanzapine nightly and can alternate zofran and compazine every 3-4 hours as needed for ongoing nausea.  - Also reviewed survival outcomes as they were under the impression that her survival percentage was around 5%, and we discussed that her average expected lung cancer specific survival is likely significantly higher and her treatment was with curative intent.  - She harbors an ALK fusion per ALCHEMIST screening testing and wishes to proceed with the interventional portion of the trial once chemotherapy is nearing completion. Will coordinate with our research team once this is closer.  - She will return for treatment on Monday and then will have follow up in 3 weeks prior to cycle 3.    Any elements of the HPI and Assessment/Plan copied from prior notes have been updated where appropriate and all reflect current medical decision making from today, 01/23/2022. Physical examination listed above was completed in entirety today, 01/23/2022.    Hildred Priest, MD  Thoracic and Head & Neck Oncology  [facility]  [facility]

## 2022-01-26 ENCOUNTER — Ambulatory Visit: Admit: 2022-01-26 | Payer: MEDICARE

## 2022-01-26 DIAGNOSIS — Z5111 Encounter for antineoplastic chemotherapy: Secondary | ICD-10-CM

## 2022-01-26 DIAGNOSIS — C3412 Malignant neoplasm of upper lobe, left bronchus or lung: Secondary | ICD-10-CM

## 2022-01-26 LAB — COMPREHENSIVE METABOLIC PANEL
ALT: 19 U/L (ref 7–52)
AST: 16 U/L (ref 13–39)
Albumin: 4 g/dL (ref 3.5–5.7)
Alkaline Phosphatase: 65 U/L (ref 36–125)
Anion Gap: 9 mmol/L (ref 3–16)
BUN: 29 mg/dL — ABNORMAL HIGH (ref 7–25)
CO2: 23 mmol/L (ref 21–33)
Calcium: 9.4 mg/dL (ref 8.6–10.3)
Chloride: 106 mmol/L (ref 98–110)
Creatinine: 1.22 mg/dL (ref 0.60–1.30)
EGFR: 47
Glucose: 142 mg/dL — ABNORMAL HIGH (ref 70–100)
Osmolality, Calculated: 294 mOsm/kg (ref 278–305)
Potassium: 4.2 mmol/L (ref 3.5–5.3)
Sodium: 138 mmol/L (ref 133–146)
Total Bilirubin: 0.4 mg/dL (ref 0.0–1.5)
Total Protein: 6.9 g/dL (ref 6.4–8.9)

## 2022-01-26 LAB — CBC
Hematocrit: 35.1 % (ref 35.0–45.0)
Hemoglobin: 11.9 g/dL (ref 11.7–15.5)
MCH: 31.5 pg (ref 27.0–33.0)
MCHC: 33.9 g/dL (ref 32.0–36.0)
MCV: 92.8 fL (ref 80.0–100.0)
MPV: 9.3 fL (ref 7.5–11.5)
Platelets: 340 10*3/uL (ref 140–400)
RBC: 3.78 10*6/uL — ABNORMAL LOW (ref 3.80–5.10)
RDW: 14.1 % (ref 11.0–15.0)
WBC: 9.7 10*3/uL (ref 3.8–10.8)

## 2022-01-26 LAB — MAGNESIUM: Magnesium: 1.5 mg/dL (ref 1.5–2.5)

## 2022-01-26 LAB — DIFFERENTIAL
Atypical Lymphocytes Relative: 4 % (ref 0.0–9.0)
Basophils Absolute: 0 /uL (ref 0–200)
Basophils Relative: 0 % (ref 0.0–1.0)
Eosinophils Absolute: 0 /uL — ABNORMAL LOW (ref 15–500)
Eosinophils Relative: 0 % (ref 0.0–8.0)
Lymphocytes Absolute: 2425 /uL (ref 850–3900)
Lymphocytes Relative: 21 % (ref 15.0–45.0)
Monocytes Absolute: 1067 /uL — ABNORMAL HIGH (ref 200–950)
Monocytes Relative: 11 % (ref 0.0–12.0)
Myelocytes Absolute: 194 /uL — ABNORMAL HIGH (ref 0–0)
Myelocytes Relative: 2 % — ABNORMAL HIGH (ref 0.0–0.0)
Neutrophils Absolute: 6014 /uL (ref 1500–7800)
Neutrophils Relative: 62 % (ref 40.0–80.0)
PLT Morphology: NORMAL

## 2022-01-26 MED ORDER — PEMEtrexed disodium (ALIMTA) 325 mg in sodium chloride 0.9 % 100 mL chemo infusion
100 | Freq: Once | INTRAVENOUS | Status: AC
Start: 2022-01-26 — End: 2022-01-26
  Administered 2022-01-26: 17:00:00 325 mg/m2 via INTRAVENOUS

## 2022-01-26 MED ORDER — dexAMETHasone (DECADRON) tablet 12 mg
4 | Freq: Once | ORAL | Status: AC
Start: 2022-01-26 — End: 2022-01-26
  Administered 2022-01-26: 14:00:00 12 mg via ORAL

## 2022-01-26 MED ORDER — CISplatin (PLATINOL) 110 mg, mannitol 25 % 9.375 g in sodium chloride 0.9 % 500 mL chemo infusion
1 | Freq: Once | INTRAVENOUS | Status: AC
Start: 2022-01-26 — End: 2022-01-26
  Administered 2022-01-26: 18:00:00 110 mg/m2 via INTRAVENOUS

## 2022-01-26 MED ORDER — proCHLORPERazine (COMPAZINE) tablet 10 mg
5 | Freq: Four times a day (QID) | ORAL | PRN
Start: 2022-01-26 — End: 2022-01-26

## 2022-01-26 MED ORDER — LORazepam (ATIVAN) tablet 0.5 mg
0.5 | Freq: Four times a day (QID) | ORAL | PRN
Start: 2022-01-26 — End: 2022-01-26

## 2022-01-26 MED ORDER — sodium chloride 0.9% flush 10 mL
Freq: Every day | INTRAMUSCULAR | PRN
Start: 2022-01-26 — End: 2022-01-26
  Administered 2022-01-26: 20:00:00 10 mL via INTRAVENOUS

## 2022-01-26 MED ORDER — furosemide (LASIX) injection 20 mg
10 | Freq: Once | INTRAMUSCULAR | Status: AC
Start: 2022-01-26 — End: 2022-01-26
  Administered 2022-01-26: 14:00:00 20 mg via INTRAVENOUS

## 2022-01-26 MED ORDER — sodium chloride 0.9 % infusion
Freq: Every day | INTRAVENOUS | PRN
Start: 2022-01-26 — End: 2022-01-26
  Administered 2022-01-26: 15:00:00 25 mL/h via INTRAVENOUS

## 2022-01-26 MED ORDER — palonosetron (ALOXI) injection 0.25 mg
0.25 | Freq: Once | INTRAVENOUS | Status: AC
Start: 2022-01-26 — End: 2022-01-26
  Administered 2022-01-26: 14:00:00 0.25 mg via INTRAVENOUS

## 2022-01-26 MED ORDER — aprepitant (CINVANTI) IV emulsion 130 mg
7.2 | Freq: Once | INTRAVENOUS | Status: AC
Start: 2022-01-26 — End: 2022-01-26
  Administered 2022-01-26: 14:00:00 130 mg via INTRAVENOUS

## 2022-01-26 MED ORDER — potassium chloride 20 mEq, magnesium sulfate 1 g in sodium chloride 0.45% (1/2 NS) 1,000 mL IV infusion
2 | INTRAVENOUS | Status: AC
Start: 2022-01-26 — End: 2022-01-26
  Administered 2022-01-26: 18:00:00 500 mL/h via INTRAVENOUS

## 2022-01-26 MED ORDER — heparin lock flush 100 unit/mL syringe 500 Units
100 | Freq: Every day | INTRAVENOUS | PRN
Start: 2022-01-26 — End: 2022-01-26
  Administered 2022-01-26: 20:00:00 500 [IU]

## 2022-01-26 MED ORDER — PEMEtrexed disodium (ALIMTA) 800 mg in sodium chloride 0.9 % 100 mL chemo infusion
100 | Freq: Once | INTRAVENOUS | Status: AC
Start: 2022-01-26 — End: 2022-01-26
  Administered 2022-01-26: 15:00:00 800 mg/m2 via INTRAVENOUS

## 2022-01-26 MED ORDER — potassium chloride 20 mEq, magnesium sulfate 1 g in sodium chloride 0.45% (1/2 NS) 1,000 mL IV infusion
2 | INTRAVENOUS | Status: AC
Start: 2022-01-26 — End: 2022-01-26
  Administered 2022-01-26: 13:00:00 500 mL/h via INTRAVENOUS

## 2022-01-26 MED FILL — POTASSIUM CHLORIDE 2 MEQ/ML INTRAVENOUS SOLUTION: 2 2 mEq/mL | INTRAVENOUS | Qty: 10

## 2022-01-26 MED FILL — CISPLATIN 1 MG/ML INTRAVENOUS SOLUTION: 1 1 mg/mL | INTRAVENOUS | Qty: 110

## 2022-01-26 MED FILL — CINVANTI 7.2 MG/ML INTRAVENOUS EMULSION: 7.2 7.2 mg/mL | INTRAVENOUS | Qty: 18

## 2022-01-26 MED FILL — FUROSEMIDE 10 MG/ML INJECTION SOLUTION: 10 10 mg/mL | INTRAMUSCULAR | Qty: 2

## 2022-01-26 MED FILL — PEMETREXED DISODIUM 100 MG INTRAVENOUS POWDER FOR SOLUTION: 100 100 mg | INTRAVENOUS | Qty: 32

## 2022-01-26 MED FILL — PEMETREXED DISODIUM 100 MG INTRAVENOUS POWDER FOR SOLUTION: 100 100 mg | INTRAVENOUS | Qty: 13

## 2022-01-26 MED FILL — PALONOSETRON 0.25 MG/5 ML INTRAVENOUS SOLUTION: 0.25 0.25 mg/5 mL | INTRAVENOUS | Qty: 5

## 2022-01-26 MED FILL — SODIUM CHLORIDE 0.45 % INTRAVENOUS SOLUTION: 0.45 0.45 % | INTRAVENOUS | Qty: 1000

## 2022-01-26 MED FILL — DEXAMETHASONE 4 MG TABLET: 4 4 MG | ORAL | Qty: 3

## 2022-01-26 NOTE — Unmapped (Signed)
Patient is here for Cisplatin/alimta/hydration.VSS. Port accessed aseptically with good return. Blood sample taken and sent to lab. Fall risk assessed, not a fall risk. Pre hydration started.Pre meds given. Alimta was given. Patient went to tget something to drink and patient accidentally knock off the clave port where the infusion was connected and got broken. The remaining volume was spilled on the floor. The pump checked and volume infused was 67ml (total volume was 100 ml and 15 ml overfill). Chemo spillage protocol in placed. Informed the provider and pharmacy. As per provider to dose the remaining portion, pharmacy preparing the remaining dose.  Patient was aware of the plan.   1243- Alimta 325mg  (remainder dose) given without issue.   1334- Cisplatin given after proper verification per protocol.   Post hydration given .  Treatment completed, pt tolerate well. Denies any side effects. Chemo safety/tips provided. Answered all questions. Port checked for blood return, flushed and heparinized. Patient requested to keep port accessed for hydration tomorrow. Left in a baseline condition.

## 2022-01-27 ENCOUNTER — Ambulatory Visit: Admit: 2022-01-27 | Payer: MEDICARE

## 2022-01-27 DIAGNOSIS — C3412 Malignant neoplasm of upper lobe, left bronchus or lung: Secondary | ICD-10-CM

## 2022-01-27 MED ORDER — heparin lock flush 100 unit/mL syringe 500 Units
100 | Freq: Every day | INTRAVENOUS | PRN
Start: 2022-01-27 — End: 2022-01-27
  Administered 2022-01-27: 18:00:00 500 [IU] via INTRAVENOUS

## 2022-01-27 MED ORDER — potassium chloride 20 mEq, magnesium sulfate 1 g in sodium chloride 0.45% (1/2 NS) 1,000 mL IV infusion
2 | INTRAVENOUS | Status: AC
Start: 2022-01-27 — End: 2022-01-27
  Administered 2022-01-27: 16:00:00 500 mL/h via INTRAVENOUS

## 2022-01-27 MED ORDER — sodium chloride 0.9% flush 10 mL
Freq: Every day | INTRAMUSCULAR | PRN
Start: 2022-01-27 — End: 2022-01-27
  Administered 2022-01-27: 18:00:00 10 mL via INTRAVENOUS

## 2022-01-27 MED FILL — SODIUM CHLORIDE 0.45 % INTRAVENOUS SOLUTION: 0.45 0.45 % | INTRAVENOUS | Qty: 1000

## 2022-01-27 NOTE — Unmapped (Signed)
Pt arrived to infusion therapy for IV Hydration. 1000 ml of normal saline with of K and 1 G of mag infused over two hours without incident.Post infusion VSS. AVS sent with pt. Pt left ambulatory at their baseline. Port left accessed for tommorows treatment. Confirmed good blood return and heparin locked.

## 2022-01-28 ENCOUNTER — Ambulatory Visit: Admit: 2022-01-28 | Payer: MEDICARE

## 2022-01-28 DIAGNOSIS — C3412 Malignant neoplasm of upper lobe, left bronchus or lung: Secondary | ICD-10-CM

## 2022-01-28 MED ORDER — sodium chloride 0.9% flush 10 mL
Freq: Every day | INTRAMUSCULAR | PRN
Start: 2022-01-28 — End: 2022-01-28

## 2022-01-28 MED ORDER — potassium chloride 20 mEq, magnesium sulfate 1 g in sodium chloride 0.45% (1/2 NS) 1,000 mL IV infusion
2 | INTRAVENOUS | Status: AC
Start: 2022-01-28 — End: 2022-01-28
  Administered 2022-01-28: 15:00:00 500 mL/h via INTRAVENOUS

## 2022-01-28 MED ORDER — heparin lock flush 100 unit/mL syringe 500 Units
100 | Freq: Every day | INTRAVENOUS | PRN
Start: 2022-01-28 — End: 2022-01-28

## 2022-01-28 MED ORDER — sodium chloride 0.9 % infusion
Freq: Every day | INTRAVENOUS | PRN
Start: 2022-01-28 — End: 2022-01-28

## 2022-01-28 MED FILL — SODIUM CHLORIDE 0.45 % INTRAVENOUS SOLUTION: 0.45 0.45 % | INTRAVENOUS | Qty: 1000

## 2022-01-28 NOTE — Unmapped (Signed)
Patient at infusion clinic for hydration. Port accessed yesterday for hydration. Port flushed with blood return noted. No labs needed today.    1100 1 liter NS with 20 mEq potassium chloride started and completed.    Treatment complete, pt tolerated well, denies any side effects at this time. Please see flowsheet for post care VS. Port checked and noted positive blood return,  flushed and heparinized, access removed with tip intact.  Follow up appt verified. Pt discharged to home in baseline condition. AVS provided.

## 2022-02-04 ENCOUNTER — Ambulatory Visit: Payer: MEDICARE

## 2022-02-13 ENCOUNTER — Ambulatory Visit: Admit: 2022-02-13 | Discharge: 2022-02-13 | Payer: MEDICARE

## 2022-02-13 DIAGNOSIS — H903 Sensorineural hearing loss, bilateral: Secondary | ICD-10-CM

## 2022-02-13 DIAGNOSIS — H9313 Tinnitus, bilateral: Secondary | ICD-10-CM

## 2022-02-13 DIAGNOSIS — C3412 Malignant neoplasm of upper lobe, left bronchus or lung: Secondary | ICD-10-CM

## 2022-02-13 MED ORDER — OLANZapine (ZYPREXA) 15 MG tablet
15 | ORAL_TABLET | Freq: Every evening | ORAL | 3 refills | Status: AC
Start: 2022-02-13 — End: ?

## 2022-02-13 NOTE — Unmapped (Signed)
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 11/18/21: Left VATS lingulectomy  2. 01/02/22: Adjuvant cisplatin, pemetrexed (C2 dose reduced due to intolerance - n/v/fatigue)    ECOG PERFORMANCE STATUS: (1) Restricted in physically strenuous activity but ambulatory and able to carry out work of a light or sedentary nature, e.g., light house work, office work      INTERVAL HISTORY: Joanna Lopez is here for follow up of her lung cancer with her daughter. She tolerated the last treatment much better than the first with improved fatigue and nausea. She still had a couple of days of n/v but this was managed well with anti-emetics. She had a hearing exam with mild high frequency decline but not noticeable to the patient. She has noted some hair thinning recently. No other complaints or concerns. No changes in breathing.         MEDICATIONS:  Current Outpatient Medications   Medication Sig    aspirin Take 1 tablet (325 mg total) by mouth daily.     aspirin-acetaminophen-caffeine Take 1 tablet by mouth every 6 hours as needed for Pain.    atorvastatin Take 1 tablet (80 mg total) by mouth daily.    calcium carbonate (CALCIUM 300 ORAL) Take by mouth.    dexAMETHasone Take 4 mg twice a day for five days starting the day before pemetrexed.    folic acid Take 1 tablet (1 mg total) by mouth daily. Start 1 week before first cycle and continue for 21 days after the last dose of pemetrexed.    lidocaine-prilocaine Apply topically if needed.    lipase/protease/amylase (CREON ORAL) Take 1 capsule by mouth if needed (stomach pain).    lisinopriL Take 1 tablet (10 mg total) by mouth daily. 10 mg (half tablet) daily    MELATONIN ORAL Take 3-5 mg by mouth at bedtime. PRN for sleep    multivitamin Take 1 tablet by mouth daily.    naloxone Apply 1 spray in one nostril if needed. Call 911. May repeat dose in other nostril if no response in 3 minutes.    OLANZapine Take 1 tablet (10 mg total) by mouth at bedtime. Indications: Prevention of Chemotherapy-Induced Nausea and Vomiting    ondansetron Take 1 tablet (8 mg total) by mouth every 8 hours as needed for Nausea.    PARoxetine Take 1 tablet (20 mg total) by mouth daily.  proCHLORPERazine Take 1 tablet (10 mg total) by mouth every 6 hours as needed (For nausea and vomiting).    roPINIRole Take 1 tablet (0.5 mg total) by mouth daily.    diclofenac 1 drop if needed (ear itching). ears    OLANZapine Take 1 tablet (15 mg total) by mouth at bedtime. Indications: Prevention of Chemotherapy-Induced Nausea and Vomiting     No current facility-administered medications for this visit.       ALLERGIES:    Penicillins, Adhesive, Formaldehyde analogues, Nsaids (non-steroidal anti-inflammatory drug), Promethazine hcl, Latex, and Sulfa (sulfonamide antibiotics)       REVIEW OF SYSTEMS:  Review of Systems   Constitutional:  Negative for chills, diaphoresis, fever, malaise/fatigue and weight loss.   HENT:  Negative for congestion, hearing  loss, sore throat and tinnitus.    Eyes:  Negative for blurred vision and double vision.   Respiratory:  Negative for cough, hemoptysis and shortness of breath.    Cardiovascular:  Negative for chest pain, palpitations and leg swelling.   Gastrointestinal:  Positive for nausea and vomiting. Negative for abdominal pain and diarrhea.   Genitourinary:  Negative for dysuria, flank pain and urgency.   Musculoskeletal:  Negative for joint pain, myalgias and neck pain.   Skin:  Negative for itching and rash.   Neurological:  Negative for dizziness, weakness and headaches.   Psychiatric/Behavioral:  Negative for depression, substance abuse and suicidal ideas. The patient is not nervous/anxious.    All other systems reviewed and are negative.    PHYSICAL EXAMINATION:  Vitals:    02/13/22 0920   BP: 131/89   BP Location: Left upper arm   Pulse: 87   Resp: 16   Temp: 97.3 F (36.3 C)   TempSrc: Temporal   SpO2: 95%   Weight: 183 lb 3.2 oz (83.1 kg)   Height: 5' 6.5 (1.689 m)    Body surface area is 1.97 meters squared.    Physical Exam  Constitutional:       General: She is not in acute distress.     Appearance: Normal appearance. She is normal weight. She is not ill-appearing.   HENT:      Head: Normocephalic and atraumatic.      Nose: Nose normal.      Mouth/Throat:      Mouth: Mucous membranes are moist.      Pharynx: Oropharynx is clear.   Eyes:      General: No scleral icterus.     Extraocular Movements: Extraocular movements intact.      Conjunctiva/sclera: Conjunctivae normal.   Cardiovascular:      Rate and Rhythm: Normal rate.      Pulses: Normal pulses.   Pulmonary:      Effort: Pulmonary effort is normal.   Abdominal:      General: Abdomen is flat. There is no distension.   Musculoskeletal:         General: No swelling or tenderness. Normal range of motion.      Cervical back: Normal range of motion.   Skin:     General: Skin is warm and dry.      Coloration: Skin is not jaundiced.      Findings: No rash.    Neurological:      General: No focal deficit present.      Mental Status: She is alert and oriented to person, place, and time. Mental status is at baseline.      Cranial Nerves: No cranial nerve deficit.  Gait: Gait normal.   Psychiatric:         Mood and Affect: Mood normal.         Behavior: Behavior normal.         Thought Content: Thought content normal.        LABORATORY DATA:  Lab Results   Component Value Date    WBC 9.7 01/26/2022    RBC 3.78 (L) 01/26/2022    HGB 11.9 01/26/2022    HCT 35.1 01/26/2022    MCV 92.8 01/26/2022    MCH 31.5 01/26/2022    MCHC 33.9 01/26/2022    RDW 14.1 01/26/2022    PLT 340 01/26/2022        Lab Results   Component Value Date    GLUCOSE 142 (H) 01/26/2022    BUN 29 (H) 01/26/2022    CO2 23 01/26/2022    CREATININE 1.22 01/26/2022    K 4.2 01/26/2022    NA 138 01/26/2022    CL 106 01/26/2022    CALCIUM 9.4 01/26/2022    ALBUMIN 4.0 01/26/2022    PROT 6.9 01/26/2022    ALKPHOS 65 01/26/2022    ALT 19 01/26/2022    AST 16 01/26/2022    BILITOT 0.4 01/26/2022     No results found for: TSH, T3TOTAL, T4TOTAL, T3FREE, FREET4, THYROIDAB     RADIOLOGY REVIEW:  No new imaging      ASSESSMENT AND PLAN:  1) 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 Labs reviewed. Continue monitoring for toxicities.  - Continue with dose reduced cisplatin. If eGFR is recovered will plan to go back to full dose pemetrexed.   - Continue zofran and olanzapine for nausea control. Increased olanzapine to 15mg  QHS. Discussed to take olanzapine nightly and can alternate zofran and compazine every 3-4 hours as needed for ongoing nausea.  - Will message our research team regarding timing for transition to the ALCHEMIST trial given her ALK mutation.   - She will return for treatment on Monday and then will have follow up on 10/27 prior to cycle 4 (10/30 through 11/1). Plan for new scans prior to visit on  11/17.    Any elements of the HPI and Assessment/Plan copied from prior notes have been updated where appropriate and all reflect current medical decision making from today, 02/13/2022. Physical examination listed above was completed in entirety today, 02/13/2022.    Hildred Priest, MD  Thoracic and Head & Neck Oncology  [facility]

## 2022-02-13 NOTE — Unmapped (Signed)
10/6  Met with patient while at appointment with Dr Zerita Boers. Plan for chemo on Monday--cycle 3. Follow up with provider on 10/27 prior to c4 on 10/30.     Scans after c4.     Pt denies any questions or needs at this time. Encouraged to call with needs, questions or concerns. Will continue to follow.     Future Appointments   Date Time Provider Department Center   02/16/2022  7:30 AM INFUSION NURSE 1 [facility]   02/17/2022  9:30 AM INFUSION NURSE 4 [facility] 202 [facility]   02/18/2022  9:30 AM INFUSION NURSE 4 [facility] 202 [facility]   03/06/2022  9:00 AM INFUSION NURSE 2 [facility] 202 [facility]   03/06/2022 10:00 AM [place] [facility]   03/09/2022  7:30 AM INFUSION NURSE 1 [facility] 202 [facility]   03/10/2022  9:30 AM INFUSION NURSE 4 [facility] 202 [facility] INF [facility]   03/11/2022  9:30 AM INFUSION NURSE 4 [facility] 202 [facility]F [facility]   03/13/2022 10:30 AM Zollie Pee, Au.D. [facility] ENT [facility]   04/16/2022 11:30 AM Ivery Quale, MD [facility] PULM [facility]

## 2022-02-13 NOTE — Unmapped (Signed)
Joanna Lopez  07-28-1950  [id number]    Ototoxic Monitoring Survey      12/31/2021     4:00 PM 02/13/2022     9:00 AM   AMB ENT OTOTOXIC MONITORING   Referral Source oncology - Dr. Levander Campion, MD   Test Interval Type Baseline Follow up   Test interval length  6 weeks after start of treatment   Hearing loss onset Feels hearing is normal Pt denies change   Tinnitus? No Bilateral   Family members with hearing loss Yes    If yes, who? Mother - later in life; worn hearing aids for 5-10 years    History of noise exposure? No    Any balance concerns- dizziness, vertigo, imbalance? slight since surgery; getting better Nothing new   Hearing aid user? No    Otoscopy? Non-occluding cerumen Non-occluding cerumen   Tympanometry type Type A Au A   Hearing Loss Grading N/A N/A   Tinnitus Grading N/A 1   Vertigo Grading N/A N/A       Ototoxic Monitoring Air Conduction Results          02/13/2022    09:00   Air Conduction Results   125 Hz- Right 15   250 Hz- Right -5   500 Hz- Right 5   1000 Hz- Right 5   2000 Hz- Right 15   3000 Hz- Right 25   4000 Hz- Right 20   6000 Hz- Right 25   8000 Hz- Right 40   9000 Hz- Right 45   10 kHz- Right 60   11.2 kHz- Right 65   12.5 kHz- Right 70   14 kHz- Right 80   16 kHz- Right NR   18 kHz- Right NR   20 kHz- Right NR   125 Hz- Left 15   250 Hz- Left 5   500 Hz- Left 10   1000 Hz- Left 10   2000 Hz- Left 15   3000 Hz- Left 15   4000 Hz- Left 15   6000 Hz- Left 15   8000 Hz- Left 35   9000 Hz- Left 55   10 kHz- Left 65   11.2 kHz- Left 60   12.5 kHz- Left 75   14 kHz- Left 80   16 kHz- Left NR   18 kHz- Left NR   20 kHz- Left NR   Bone Conduction Results   500 Hz- Right 10   1000 Hz- Right 5   2000 Hz- Right 15   3000 Hz- Right 20   4000 Hz- Right 15   500 Hz- Left 10   1000 Hz- Left 5   2000 Hz- Left 15   3000 Hz- Left 15   4000 Hz- Left 5       Determining ototoxic follow up  Grading Scale for change hearing loss:  None-No Change from Baseline  Please note:  20dB worsening  at [facility] in the Right ear.  Also, in the Extended High Frequencies, both Left and Right ears dropped at [facility].  There is also now no response at 16kHz, previously threshold was at 60dBHL, AU.    Grading Scale Tinnitus:  Grade 1- Mild Symptoms Intervention not indicated  -- Pt reports intermittent bilateral tinnitus    Grading Scale Vertigo:  N/A    Date of next scheduled appointment:  Pt was escorted to the front desk and scheduled to return in 4 weeks for repeat testing.    - Results  were reviewed with the patient and she reports understanding.    - Today's note was routed to Dr. Zerita Boers and Angelina Pih, RN.    - Pt was encouraged to reach out to Dr. Zerita Boers sooner if she notices a worsening in otologic symptoms.    Zollie Pee, AuD

## 2022-02-16 ENCOUNTER — Ambulatory Visit: Admit: 2022-02-16 | Payer: MEDICARE

## 2022-02-16 DIAGNOSIS — Z5111 Encounter for antineoplastic chemotherapy: Secondary | ICD-10-CM

## 2022-02-16 DIAGNOSIS — C3412 Malignant neoplasm of upper lobe, left bronchus or lung: Secondary | ICD-10-CM

## 2022-02-16 LAB — COMPREHENSIVE METABOLIC PANEL
ALT: 22 U/L (ref 7–52)
AST: 20 U/L (ref 13–39)
Albumin: 3.6 g/dL (ref 3.5–5.7)
Alkaline Phosphatase: 55 U/L (ref 36–125)
Anion Gap: 7 mmol/L (ref 3–16)
BUN: 33 mg/dL — ABNORMAL HIGH (ref 7–25)
CO2: 25 mmol/L (ref 21–33)
Calcium: 8.9 mg/dL (ref 8.6–10.3)
Chloride: 106 mmol/L (ref 98–110)
Creatinine: 1.24 mg/dL (ref 0.60–1.30)
EGFR: 47
Glucose: 112 mg/dL — ABNORMAL HIGH (ref 70–100)
Osmolality, Calculated: 294 mOsm/kg (ref 278–305)
Potassium: 4.2 mmol/L (ref 3.5–5.3)
Sodium: 138 mmol/L (ref 133–146)
Total Bilirubin: 0.5 mg/dL (ref 0.0–1.5)
Total Protein: 6.1 g/dL — ABNORMAL LOW (ref 6.4–8.9)

## 2022-02-16 LAB — DIFFERENTIAL
Basophils Absolute: 26 /uL (ref 0–200)
Basophils Relative: 0.5 % (ref 0.0–1.0)
Eosinophils Absolute: 5 /uL — ABNORMAL LOW (ref 15–500)
Eosinophils Relative: 0.1 % (ref 0.0–8.0)
Lymphocytes Absolute: 1139 /uL (ref 850–3900)
Lymphocytes Relative: 21.9 % (ref 15.0–45.0)
Monocytes Absolute: 998 /uL — ABNORMAL HIGH (ref 200–950)
Monocytes Relative: 19.2 % — ABNORMAL HIGH (ref 0.0–12.0)
Neutrophils Absolute: 3032 /uL (ref 1500–7800)
Neutrophils Relative: 58.3 % (ref 40.0–80.0)
nRBC: 0 /100 WBC (ref 0–0)

## 2022-02-16 LAB — CBC
Hematocrit: 30.5 % — ABNORMAL LOW (ref 35.0–45.0)
Hemoglobin: 10.5 g/dL — ABNORMAL LOW (ref 11.7–15.5)
MCH: 31.7 pg (ref 27.0–33.0)
MCHC: 34.3 g/dL (ref 32.0–36.0)
MCV: 92.3 fL (ref 80.0–100.0)
MPV: 8.8 fL (ref 7.5–11.5)
Platelets: 187 10*3/uL (ref 140–400)
RBC: 3.31 10*6/uL — ABNORMAL LOW (ref 3.80–5.10)
RDW: 14.8 % (ref 11.0–15.0)
WBC: 5.2 10*3/uL (ref 3.8–10.8)

## 2022-02-16 LAB — MAGNESIUM: Magnesium: 1.1 mg/dL — ABNORMAL LOW (ref 1.5–2.5)

## 2022-02-16 MED ORDER — alteplase (CATHFLO) injection 2 mg
2 | PRN
Start: 2022-02-16 — End: 2022-02-16
  Administered 2022-02-16: 12:00:00 2 mg

## 2022-02-16 MED ORDER — palonosetron (ALOXI) injection 0.25 mg
0.25 | Freq: Once | INTRAVENOUS | Status: AC
Start: 2022-02-16 — End: 2022-02-16
  Administered 2022-02-16: 15:00:00 0.25 mg via INTRAVENOUS

## 2022-02-16 MED ORDER — sterile water (PF) Soln 2.2 mL
INTRAMUSCULAR | PRN
Start: 2022-02-16 — End: 2022-02-16
  Administered 2022-02-16: 12:00:00 2.2 mL

## 2022-02-16 MED ORDER — magnesium sulfate in D5W 100 mL 1 gram/100 mL IVPB 1 g
1 | INTRAVENOUS | Status: AC | PRN
Start: 2022-02-16 — End: 2022-02-16
  Administered 2022-02-16 (×2): 1 g via INTRAVENOUS

## 2022-02-16 MED ORDER — sodium chloride 0.9% flush 10 mL
Freq: Every day | INTRAMUSCULAR | PRN
Start: 2022-02-16 — End: 2022-02-16

## 2022-02-16 MED ORDER — dexAMETHasone (DECADRON) tablet 12 mg
4 | Freq: Once | ORAL | Status: AC
Start: 2022-02-16 — End: 2022-02-16
  Administered 2022-02-16: 15:00:00 12 mg via ORAL

## 2022-02-16 MED ORDER — furosemide (LASIX) injection 20 mg
10 | Freq: Once | INTRAMUSCULAR | Status: AC
Start: 2022-02-16 — End: 2022-02-16
  Administered 2022-02-16: 15:00:00 20 mg via INTRAVENOUS

## 2022-02-16 MED ORDER — potassium chloride 20 mEq, magnesium sulfate 1 g in sodium chloride 0.45% (1/2 NS) 1,000 mL IV infusion
2 | INTRAVENOUS | Status: AC
Start: 2022-02-16 — End: 2022-02-16
  Administered 2022-02-16: 17:00:00 500 mL/h via INTRAVENOUS

## 2022-02-16 MED ORDER — alteplase 1mg/mL in sterile water (ACTIVASE) 1 mg in sterile water (PF) syringe for line care
100 | Freq: Once | INTRAVENOUS | PRN
Start: 2022-02-16 — End: 2022-02-16

## 2022-02-16 MED ORDER — CISplatin (PLATINOL) 110 mg, mannitol 25 % 12.5 g in sodium chloride 0.9 % 500 mL chemo infusion
1 | Freq: Once | INTRAVENOUS | Status: AC
Start: 2022-02-16 — End: 2022-02-16
  Administered 2022-02-16: 17:00:00 110 mg/m2 via INTRAVENOUS

## 2022-02-16 MED ORDER — PEMEtrexed disodium (ALIMTA) 800 mg in sodium chloride 0.9 % 100 mL chemo infusion
100 | Freq: Once | INTRAVENOUS | Status: AC
Start: 2022-02-16 — End: 2022-02-16
  Administered 2022-02-16: 16:00:00 800 mg/m2 via INTRAVENOUS

## 2022-02-16 MED ORDER — potassium chloride 20 mEq, magnesium sulfate 1 g in sodium chloride 0.45% (1/2 NS) 1,000 mL IV infusion
2 | INTRAVENOUS | Status: AC
Start: 2022-02-16 — End: 2022-02-16
  Administered 2022-02-16: 14:00:00 500 mL/h via INTRAVENOUS

## 2022-02-16 MED ORDER — aprepitant (CINVANTI) IV emulsion 130 mg
7.2 | Freq: Once | INTRAVENOUS | Status: AC
Start: 2022-02-16 — End: 2022-02-16
  Administered 2022-02-16: 15:00:00 130 mg via INTRAVENOUS

## 2022-02-16 MED ORDER — proCHLORPERazine (COMPAZINE) tablet 10 mg
5 | Freq: Four times a day (QID) | ORAL | PRN
Start: 2022-02-16 — End: 2022-02-16

## 2022-02-16 MED ORDER — LORazepam (ATIVAN) tablet 0.5 mg
0.5 | Freq: Four times a day (QID) | ORAL | PRN
Start: 2022-02-16 — End: 2022-02-16

## 2022-02-16 MED ORDER — heparin lock flush 100 unit/mL syringe 500 Units
100 | Freq: Every day | INTRAVENOUS | PRN
Start: 2022-02-16 — End: 2022-02-16

## 2022-02-16 MED ORDER — sodium chloride 0.9 % infusion
Freq: Every day | INTRAVENOUS | PRN
Start: 2022-02-16 — End: 2022-02-16

## 2022-02-16 MED FILL — PEMETREXED DISODIUM 100 MG INTRAVENOUS POWDER FOR SOLUTION: 100 100 mg | INTRAVENOUS | Qty: 32

## 2022-02-16 MED FILL — MAGNESIUM SULFATE 1 GRAM/100 ML IN DEXTROSE 5 % INTRAVENOUS PIGGYBACK: 1 1 gram/100 mL | INTRAVENOUS | Qty: 100

## 2022-02-16 MED FILL — CATHFLO ACTIVASE 2 MG INTRA-CATHETER SOLUTION: 2 2 mg | Qty: 2

## 2022-02-16 MED FILL — POTASSIUM CHLORIDE 2 MEQ/ML INTRAVENOUS SOLUTION: 2 2 mEq/mL | INTRAVENOUS | Qty: 10

## 2022-02-16 MED FILL — SODIUM CHLORIDE 0.45 % INTRAVENOUS SOLUTION: 0.45 0.45 % | INTRAVENOUS | Qty: 1000

## 2022-02-16 MED FILL — PALONOSETRON 0.25 MG/5 ML INTRAVENOUS SOLUTION: 0.25 0.25 mg/5 mL | INTRAVENOUS | Qty: 5

## 2022-02-16 MED FILL — CINVANTI 7.2 MG/ML INTRAVENOUS EMULSION: 7.2 7.2 mg/mL | INTRAVENOUS | Qty: 18

## 2022-02-16 MED FILL — CISPLATIN 1 MG/ML INTRAVENOUS SOLUTION: 1 1 mg/mL | INTRAVENOUS | Qty: 110

## 2022-02-16 MED FILL — WATER FOR INJECTION, STERILE INJECTION SOLUTION: 2.20 2.20 mL | INTRAMUSCULAR | Qty: 10

## 2022-02-16 MED FILL — DEXAMETHASONE 4 MG TABLET: 4 4 MG | ORAL | Qty: 3

## 2022-02-16 MED FILL — FUROSEMIDE 10 MG/ML INJECTION SOLUTION: 10 10 mg/mL | INTRAMUSCULAR | Qty: 2

## 2022-02-16 NOTE — Unmapped (Signed)
Pt here for Day 1, Cycle 3 Cisplatin. Port accessed per protocol, no blood return and flushes easily  Pt reports no change in medication and no new issues.   0801-Cathflo instilled in [facility].        labs reviewed. Dr Zerita BoersLemmon notified. Orders signed. Dose reduced. Mag 1.1   ok to pre pare sent to Pharmacy.   [id number]- Pre-Hydration with electrolytes started. Magnesium started.  1122- Pre meds given.   1207-Magnesium started.    1307 infusion complete line flushed. Good blood return noted. Patient tolerated well.   1223- Alimta started.   1240- Infusion compete. Line flushed with good blood return. Patient tolerated well.   1240- Cisplatin started.   1515-Infusion complete. Patient tolerated well.         Port flushed and port remains accessed  Next appointment is scheduled.  Patient educated on side effects of medication and when to call doctor, Patient voices understanding.  Patient dismissed from the Infusion center in stable condition.

## 2022-02-16 NOTE — Unmapped (Signed)
Outpatient Social Work Consultation:    Referred UJ:[id number] Schedule    Reason for Referral: New patient assessement    Biopsychosocial Assessment    Presenting Problem:  Joanna Lopez is a 71 y.o. Widowed White or Caucasian female.     No diagnosis found.    Current Living Arrangements  Lives Alone     Environment  [place] [facility].    Support System  Daughter is main support. Daughter and son in law are missionaries who live in [place], [place]. Pt states they were just here for for several months. They just went back to [place], but are coming back next week to stay as long as needed. They stay with pt when they are here. Pt has a step son who lives in [place]. Pt states she has lots of good friends and neighbors where she lives.      Support Person  Extended Emergency Contact Information  Primary Emergency Contact: Joanna Lopez   [place]  Home Phone: [phone]  Relation: Daughter  Preferred language: English  Interpreter needed? No  Secondary Emergency Contact: Joanna Lopez [place]  Mobile Phone: [phone]  Relation: Son  Preferred language: English  Interpreter needed? No     Advanced Directives  Patient has Advance Directive - Patient yet to provide document     Barriers to Care Related to Living Situation  No barriers noted    Cultural/Spiritual/Language Barriers   No barriers identified     Mental Health:   Pt is noted to be on Paxil. No noted or reported problems today.        12/18/2021 02/13/2022   Patient Health Questionnaire (PHQ-9)   Little interest or pleasure in doing things 0 0   Feeling down, depressed, or hopeless 0 0       Chemical Dependence  Did not assess    Financial  Patient does not express financial concerns Pt reports no problems with her bills.    Vocational  Patient retired    Psychologist, sport and exercise to Care  No legal issues    Education/Literacy  No literacy issues.    Transportation  Drives own vehicle Friend did bring  her today.    Level of Functioning/Activities of Daily Living  ADL List:   Independent. Pt does her own cooking. She has someone to clean weekly. She can get more services as needed.     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     Durable Medical/Adaptive Equipment  None noted.    Identified Need(s) and Intervention(s)    Needs: Adjustment to Illness    Interventions:Assessment, Community Services Referral(s) Cancer Family Care, and Education/Information    Clinical Impressions  Pt here for treatment today. Pt states this is her 3rd treatment and she only has one more left. She states she will be going on a trial after that. Pt states she felt terrible after first treatment due to premed instructions being wrong. She states 2nd treatment was better. Pt reports good support from family and friends.    SW met with pt. SW introduced self, explained role of SW and provided contact information. SW discussed Cancer Support Resources and provided pt with the same. Pt would like referral to Cancer Family Care for massages. SW encouraged pt to contact SW if she needs anything else.    SW made referral to Cancer [facility]  CANCER SUPPORT RESOURCES    [facility] Outpatient Social Work Joanna Lopez  Social workers provide support and other services which can help reduce stress for you and your family through your cancer journey.  Joanna Lopez, MSW, LSW, [phone]                      [email or web address]    Joanna Lopez MSW, [facility], [phone]  [email or web address]       American Cancer Society Helpline  Information, day-to-day help, emotional support, programs, services, and resources to help you on your journey.  Cancer Information Specialists are available 24/7  [phone]  cancer.org       Cancer Support Community  Dealing with the stress of cancer for you or a loved one is overwhelming - emotionally and socially, but  Cancer Support Community of [place] and [place] is taking strides to make sure that no one faces cancer alone by offering free support and services to improve the quality of life and survivorship.  [phone]  Joanna Lopez      Cancer Family Care  Cancer Family Care offers a comprehensive array of programs and services that provides support to people coping with cancer including counseling, free massages, free healing touch and free wigs.  [phone]  Joanna Lopez       Follow Up Plan    Patient and Family are aware of options and participated in decision making/plan.   SW remains available to assist as needed.    Future Appointments   Date Time Provider Department Center   02/17/2022  9:30 AM INFUSION NURSE 4 [facility] INF [facility]   02/18/2022  9:30 AM INFUSION NURSE 4 [facility] INF [facility]   03/06/2022  9:00 AM INFUSION NURSE 2 [facility] 202 [facility] INF [facility]   03/06/2022 10:00 AM Joanna Lopez [facility]   03/09/2022  7:30 AM INFUSION NURSE 1 [facility] 202 [facility] INF [facility]   03/10/2022  9:30 AM INFUSION NURSE 4 [facility] 202 [facility] INF [facility]   03/11/2022  9:30 AM INFUSION NURSE 4 [facility] 202 [facility] INF [facility]   03/13/2022 10:30 AM Joanna Lopez, Au.D. [facility] ENT [facility]   04/16/2022 11:30 AM Joanna Quale, MD [facility] PULM [facility]      Cheyenne Bordeaux Selinda Flavin, MSW, LSW   [phone]

## 2022-02-17 ENCOUNTER — Ambulatory Visit: Admit: 2022-02-17 | Payer: MEDICARE

## 2022-02-17 DIAGNOSIS — C3412 Malignant neoplasm of upper lobe, left bronchus or lung: Secondary | ICD-10-CM

## 2022-02-17 MED ORDER — sodium chloride 0.9% flush 10 mL
Freq: Every day | INTRAMUSCULAR | PRN
Start: 2022-02-17 — End: 2022-02-17
  Administered 2022-02-17: 16:00:00 10 mL via INTRAVENOUS

## 2022-02-17 MED ORDER — potassium chloride 20 mEq, magnesium sulfate 1 g in sodium chloride 0.45% (1/2 NS) 1,000 mL IV infusion
4 | INTRAMUSCULAR | Status: AC
Start: 2022-02-17 — End: 2022-02-17
  Administered 2022-02-17: 14:00:00 500 mL/h via INTRAVENOUS

## 2022-02-17 MED ORDER — heparin lock flush 100 unit/mL syringe 500 Units
100 | Freq: Every day | INTRAVENOUS | PRN
Start: 2022-02-17 — End: 2022-02-17
  Administered 2022-02-17: 16:00:00 500 [IU]

## 2022-02-17 MED ORDER — sodium chloride 0.9 % infusion
Freq: Every day | INTRAVENOUS | PRN
Start: 2022-02-17 — End: 2022-02-17

## 2022-02-17 MED FILL — SODIUM CHLORIDE 0.45 % INTRAVENOUS SOLUTION: 0.45 0.45 % | INTRAVENOUS | Qty: 1000

## 2022-02-17 NOTE — Unmapped (Signed)
Pt arrived to infusion services for IV hydration. Vital signs gathered. Pt denies pain. Port accessed from previous clinic encounter.  1004 ~ 1 liter of normal saline with 20 mEq of potassium and 1 g of mag infused over two hours without incident.  Post infusion VSS. Port flushed and left accessed for tomorrows treatment per pt. AVS sent with the pt. PT left ambulatory at their baseline.

## 2022-02-18 ENCOUNTER — Ambulatory Visit: Admit: 2022-02-18 | Payer: MEDICARE

## 2022-02-18 DIAGNOSIS — C3412 Malignant neoplasm of upper lobe, left bronchus or lung: Secondary | ICD-10-CM

## 2022-02-18 MED ORDER — sodium chloride 0.9% flush 10 mL
Freq: Every day | INTRAMUSCULAR | PRN
Start: 2022-02-18 — End: 2022-02-18
  Administered 2022-02-18: 16:00:00 10 mL via INTRAVENOUS

## 2022-02-18 MED ORDER — potassium chloride 20 mEq, magnesium sulfate 1 g in sodium chloride 0.45% (1/2 NS) 1,000 mL IV infusion
2 | INTRAVENOUS | Status: AC
Start: 2022-02-18 — End: 2022-02-18
  Administered 2022-02-18: 14:00:00 500 mL/h via INTRAVENOUS

## 2022-02-18 MED ORDER — sodium chloride 0.9 % infusion
Freq: Every day | INTRAVENOUS | PRN
Start: 2022-02-18 — End: 2022-02-18

## 2022-02-18 MED ORDER — heparin lock flush 100 unit/mL syringe 500 Units
100 | Freq: Every day | INTRAVENOUS | PRN
Start: 2022-02-18 — End: 2022-02-18
  Administered 2022-02-18: 16:00:00 500 [IU]

## 2022-02-18 MED FILL — SODIUM CHLORIDE 0.45 % INTRAVENOUS SOLUTION: 0.45 0.45 % | INTRAVENOUS | Qty: 1000

## 2022-02-18 NOTE — Unmapped (Signed)
Patient here for Hydration with electrolytes. Port accessed with good blood return and flushes easily.      0952-Hydration started   1200- Infusion complete. Port flushed with good blood return, site dressed with bandaid. Dismissed in stable condition.Marland Kitchen

## 2022-03-05 NOTE — Unmapped (Signed)
Chart reviewed for ongoing follow up for appointment with Dr. 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 Labs reviewed. Continue monitoring for toxicities  -continue with dose reduced cisplatin. If eGFR is recovered will plan to go back to full dose pemetrexed.  -continue zofran and olanzapine for nausea control. Increased olanzapine to 15 mg qhs. Discussed to take olanzapine nightly and can alternate zofran and compazine every 3-4 hours as needed for ongoing nausea  -will message research team regarding timing for transition to the ALCHEMIST trial given her ALK mutation  -she will return for treatment on Monday and then will have follow up on 10/27 prior to C4 (10/30-11/1). Plan for new scans prior to visit on 11/17  -RTC 3 w

## 2022-03-06 ENCOUNTER — Ambulatory Visit: Admit: 2022-03-06 | Payer: MEDICARE

## 2022-03-06 ENCOUNTER — Ambulatory Visit: Admit: 2022-03-06 | Discharge: 2022-03-10 | Payer: MEDICARE

## 2022-03-06 DIAGNOSIS — C3412 Malignant neoplasm of upper lobe, left bronchus or lung: Secondary | ICD-10-CM

## 2022-03-06 DIAGNOSIS — C342 Malignant neoplasm of middle lobe, bronchus or lung: Secondary | ICD-10-CM

## 2022-03-06 MED ORDER — sodium chloride 0.9 % infusion
Freq: Once | INTRAVENOUS
Start: 2022-03-06 — End: 2022-03-09

## 2022-03-06 NOTE — Unmapped (Signed)
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)  Staging form: Lung, AJCC 8th Edition  - Clinical: No stage assigned - Unsigned  - Pathologic stage from 12/11/2016: Stage IA2 (pT1b, pN0, cM0) - Signed by Janett Billow, MD on 12/11/2016    Malignant neoplasm of upper lobe of left lung (CMS-HCC)  Staging form: Lung, AJCC 8th Edition  - Pathologic stage from 12/04/2021: Stage IIB (pT1a, pN1, cM0) - Signed by Janett Billow, MD on 12/04/2021      GENOMICS:  EGFR mt negative  PD-L1 95%  ALK-EML4 fusion positive per ALCHEMIST screening trial testing    SPECIAL MEDICAL ISSUES: 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 11/18/21: Left VATS lingulectomy  2. 01/02/22-Current: Adjuvant cisplatin, pemetrexed (C2 dose reduced due to intolerance - n/v/fatigue)    ECOG PERFORMANCE STATUS: (1) Restricted in physically strenuous activity but ambulatory and able to carry out work of a light or sedentary nature, e.g., light house work, office work      INTERVAL HISTORY:  03/06/22:  -Patient presents to clinic for follow-up with her daughter who is visiting from [place].   -S/p C3 Cisplatin/Pemetrexed on 02/16/22. Scheduled for C4 on 10/30  -Weight down ~9 lbs, appetite is decreased and not eating as much d/t nausea   -Taking zofran and compazine for nausea, which is usually helpful. States that nausea is usually worse in the first ~2 weeks of the cycle, and usually has about 3-5 bouts of vomiting in the first 1-1.5 wks of the cycle. However, states nausea affected her longer this past cycle. Was previously prescribed zyprexa, but stopped  taking this because it caused her to develop restless less.  -C/o diarrhea. Had 1 bout a day for 4 days. Diarrhea then resolved, however, complains that she had 1 bout this morning. Will try imodium  -C/o continued/stable fatigue. States she notices this more after being active/performing daily activities.   -Denies constipation, fevers, chills, night sweats, chest pain, shortness of breath, or LE edema     MEDICATIONS:  Current Outpatient Medications   Medication Sig    aspirin Take 1 tablet (325 mg total) by mouth daily.    aspirin-acetaminophen-caffeine Take 1 tablet by mouth every 6 hours as needed for Pain.    atorvastatin Take 1 tablet (80 mg total) by mouth daily.    calcium carbonate (CALCIUM 300 ORAL) Take by mouth.    dexAMETHasone Take 4 mg twice a day for five days starting the day before pemetrexed.    diclofenac 1 drop if needed (ear itching). ears    folic acid Take 1 tablet (1 mg total) by mouth daily. Start 1 week before first cycle and continue for 21 days after the last dose of pemetrexed.    lidocaine-prilocaine Apply topically if needed.    lipase/protease/amylase (CREON ORAL) Take 1 capsule by mouth if needed (stomach pain).    lisinopriL Take 1 tablet (10 mg total) by mouth daily. 10 mg (half tablet) daily    MELATONIN ORAL Take 3-5 mg  by mouth at bedtime. PRN for sleep    multivitamin Take 1 tablet by mouth daily.    naloxone Apply 1 spray in one nostril if needed. Call 911. May repeat dose in other nostril if no response in 3 minutes.    OLANZapine Take 1 tablet (10 mg total) by mouth at bedtime. Indications: Prevention of Chemotherapy-Induced Nausea and Vomiting    OLANZapine Take 1 tablet (15 mg total) by mouth at bedtime. Indications: Prevention of Chemotherapy-Induced Nausea and Vomiting    ondansetron Take 1 tablet (8 mg total) by mouth every 8 hours as needed for Nausea.    PARoxetine Take 1 tablet (20 mg total) by mouth daily.    proCHLORPERazine Take 1 tablet (10 mg total) by mouth  every 6 hours as needed (For nausea and vomiting).    roPINIRole Take 1 tablet (0.5 mg total) by mouth daily.     No current facility-administered medications for this visit.       ALLERGIES:    Penicillins, Adhesive, Formaldehyde analogues, Nsaids (non-steroidal anti-inflammatory drug), Promethazine hcl, Latex, and Sulfa (sulfonamide antibiotics)    REVIEW OF SYSTEMS:  Review of Systems   Constitutional:  Positive for malaise/fatigue and weight loss. Negative for chills, diaphoresis and fever.   HENT:  Negative for congestion, hearing loss, sore throat and tinnitus.    Eyes:  Negative for blurred vision and double vision.   Respiratory:  Negative for cough, hemoptysis and shortness of breath.    Cardiovascular:  Negative for chest pain, palpitations and leg swelling.   Gastrointestinal:  Positive for diarrhea, nausea and vomiting. Negative for abdominal pain and constipation.   Genitourinary:  Negative for dysuria, flank pain and urgency.   Musculoskeletal:  Negative for joint pain, myalgias and neck pain.   Skin:  Negative for itching and rash.   Neurological:  Negative for dizziness, weakness and headaches.   Psychiatric/Behavioral:  Negative for depression, substance abuse and suicidal ideas. The patient is not nervous/anxious.    All other systems reviewed and are negative.    PHYSICAL EXAMINATION:  Vitals:    03/06/22 0951   BP: 110/72   BP Location: Left upper arm   Pulse: 113   Resp: 16   Temp: 97.5 F (36.4 C)   TempSrc: Temporal   SpO2: 98%   Weight: 173 lb 3.2 oz (78.6 kg)    Body surface area is 1.92 meters squared.    Physical Exam  Constitutional:       General: She is not in acute distress.     Appearance: Normal appearance. She is normal weight. She is not ill-appearing.   HENT:      Head: Normocephalic and atraumatic.      Nose: Nose normal.      Mouth/Throat:      Mouth: Mucous membranes are moist.      Pharynx: Oropharynx is clear.   Eyes:      General: No scleral icterus.     Extraocular  Movements: Extraocular movements intact.      Conjunctiva/sclera: Conjunctivae normal.   Cardiovascular:      Rate and Rhythm: Normal rate.      Pulses: Normal pulses.   Pulmonary:      Effort: Pulmonary effort is normal.   Abdominal:      General: Abdomen is flat. There is no distension.   Musculoskeletal:         General: No swelling or tenderness. Normal range of motion.      Cervical  back: Normal range of motion.   Skin:     General: Skin is warm and dry.      Coloration: Skin is not jaundiced.      Findings: No rash.   Neurological:      General: No focal deficit present.      Mental Status: She is alert and oriented to person, place, and time. Mental status is at baseline.      Cranial Nerves: No cranial nerve deficit.      Gait: Gait normal.   Psychiatric:         Mood and Affect: Mood normal.         Behavior: Behavior normal.         Thought Content: Thought content normal.     LABORATORY DATA:  Lab Results   Component Value Date    WBC 5.2 02/16/2022    RBC 3.31 (L) 02/16/2022    HGB 10.5 (L) 02/16/2022    HCT 30.5 (L) 02/16/2022    MCV 92.3 02/16/2022    MCH 31.7 02/16/2022    MCHC 34.3 02/16/2022    RDW 14.8 02/16/2022    PLT 187 02/16/2022        Lab Results   Component Value Date    GLUCOSE 112 (H) 02/16/2022    BUN 33 (H) 02/16/2022    CO2 25 02/16/2022    CREATININE 1.24 02/16/2022    K 4.2 02/16/2022    NA 138 02/16/2022    CL 106 02/16/2022    CALCIUM 8.9 02/16/2022    ALBUMIN 3.6 02/16/2022    PROT 6.1 (L) 02/16/2022    ALKPHOS 55 02/16/2022    ALT 22 02/16/2022    AST 20 02/16/2022    BILITOT 0.5 02/16/2022     No results found for: TSH, T3TOTAL, T4TOTAL, T3FREE, FREET4, THYROIDAB     RADIOLOGY REVIEW:  No new imaging    ASSESSMENT AND PLAN:  1) 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 Labs reviewed. Continue monitoring for toxicities.  - Continue with dose reduced cisplatin. If eGFR is recovered  will plan to go back to full dose pemetrexed. C4D1 is scheduled for 10/30.  - Continue alternating zofran and compazine every 3-4 hours as needed for ongoing nausea. Has since stopped taking olanzapine d/t restless leg.   - Will message our research team regarding timing for transition to the ALCHEMIST trial given her ALK mutation. Plan to discuss this with the patient at her next MD visit.  - CT chest with contrast scheduled for 03/16/22. Then RTC 11/10 for MD visit and port labs.   - She will return for treatment on Monday. Receives IVF on days 2 and 3 of each cycle, however, would like IVF on day 1 of upcoming cycle. Orders placed for this today.       I attest that the information that I have pulled forward from the previous office visit note has been reviewed and is accurate to today's visit.     Rosea Dory A. Mikey Bussing, PA-C

## 2022-03-06 NOTE — Unmapped (Signed)
Pt at infusion clinic for labs and NP appointment. No labs ordered, contacted NP for orders.  NO labs needed today. No labs drawn. Pt went to MD appointment.

## 2022-03-09 ENCOUNTER — Ambulatory Visit: Admit: 2022-03-09 | Payer: MEDICARE

## 2022-03-09 DIAGNOSIS — Z5111 Encounter for antineoplastic chemotherapy: Secondary | ICD-10-CM

## 2022-03-09 DIAGNOSIS — C3412 Malignant neoplasm of upper lobe, left bronchus or lung: Secondary | ICD-10-CM

## 2022-03-09 LAB — CBC
Hematocrit: 33 % — ABNORMAL LOW (ref 35.0–45.0)
Hemoglobin: 11.3 g/dL — ABNORMAL LOW (ref 11.7–15.5)
MCH: 32 pg (ref 27.0–33.0)
MCHC: 34.3 g/dL (ref 32.0–36.0)
MCV: 93.1 fL (ref 80.0–100.0)
MPV: 9.2 fL (ref 7.5–11.5)
Platelets: 196 10*3/uL (ref 140–400)
RBC: 3.54 10*6/uL — ABNORMAL LOW (ref 3.80–5.10)
RDW: 16.5 % — ABNORMAL HIGH (ref 11.0–15.0)
WBC: 4.1 10*3/uL (ref 3.8–10.8)

## 2022-03-09 LAB — COMPREHENSIVE METABOLIC PANEL
ALT: 22 U/L (ref 7–52)
AST: 20 U/L (ref 13–39)
Albumin: 3.9 g/dL (ref 3.5–5.7)
Alkaline Phosphatase: 55 U/L (ref 36–125)
Anion Gap: 8 mmol/L (ref 3–16)
BUN: 25 mg/dL (ref 7–25)
CO2: 24 mmol/L (ref 21–33)
Calcium: 9.5 mg/dL (ref 8.6–10.3)
Chloride: 105 mmol/L (ref 98–110)
Creatinine: 1.35 mg/dL — ABNORMAL HIGH (ref 0.60–1.30)
EGFR: 42
Glucose: 159 mg/dL — ABNORMAL HIGH (ref 70–100)
Osmolality, Calculated: 292 mOsm/kg (ref 278–305)
Potassium: 4.3 mmol/L (ref 3.5–5.3)
Sodium: 137 mmol/L (ref 133–146)
Total Bilirubin: 0.5 mg/dL (ref 0.0–1.5)
Total Protein: 6.4 g/dL (ref 6.4–8.9)

## 2022-03-09 LAB — DIFFERENTIAL
Basophils Absolute: 12 /uL (ref 0–200)
Basophils Relative: 0.3 % (ref 0.0–1.0)
Eosinophils Absolute: 4 /uL — ABNORMAL LOW (ref 15–500)
Eosinophils Relative: 0.1 % (ref 0.0–8.0)
Lymphocytes Absolute: 725 /uL — ABNORMAL LOW (ref 850–3900)
Lymphocytes Relative: 18.6 % (ref 15.0–45.0)
Monocytes Absolute: 199 /uL — ABNORMAL LOW (ref 200–950)
Monocytes Relative: 5.1 % (ref 0.0–12.0)
Neutrophils Absolute: 2960 /uL (ref 1500–7800)
Neutrophils Relative: 75.9 % (ref 40.0–80.0)
nRBC: 0 /100 WBC (ref 0–0)

## 2022-03-09 LAB — MAGNESIUM: Magnesium: 1.1 mg/dL — ABNORMAL LOW (ref 1.5–2.5)

## 2022-03-09 MED ORDER — CISplatin (PLATINOL) 110 mg, mannitol 25 % 12.5 g in sodium chloride 0.9 % 500 mL chemo infusion
1 | Freq: Once | INTRAVENOUS | Status: AC
Start: 2022-03-09 — End: 2022-03-09
  Administered 2022-03-09: 17:00:00 110 mg/m2 via INTRAVENOUS

## 2022-03-09 MED ORDER — potassium chloride 20 mEq, magnesium sulfate 1 g in sodium chloride 0.45% (1/2 NS) 1,000 mL IV infusion
4 | INTRAMUSCULAR | Status: AC
Start: 2022-03-09 — End: 2022-03-09
  Administered 2022-03-09: 19:00:00 500 mL/h via INTRAVENOUS

## 2022-03-09 MED ORDER — heparin lock flush 100 unit/mL syringe 500 Units
100 | Freq: Every day | INTRAVENOUS | PRN
Start: 2022-03-09 — End: 2022-03-09

## 2022-03-09 MED ORDER — potassium chloride 20 mEq, magnesium sulfate 1 g in sodium chloride 0.45% (1/2 NS) 1,000 mL IV infusion
0.45 | INTRAVENOUS | Status: AC
Start: 2022-03-09 — End: 2022-03-09
  Administered 2022-03-09: 13:00:00 500 mL/h via INTRAVENOUS

## 2022-03-09 MED ORDER — aprepitant (CINVANTI) IV emulsion 130 mg
7.2 | Freq: Once | INTRAVENOUS | Status: AC
Start: 2022-03-09 — End: 2022-03-09
  Administered 2022-03-09: 15:00:00 130 mg via INTRAVENOUS

## 2022-03-09 MED ORDER — furosemide (LASIX) injection 20 mg
10 | Freq: Once | INTRAMUSCULAR | Status: AC
Start: 2022-03-09 — End: 2022-03-09
  Administered 2022-03-09: 15:00:00 20 mg via INTRAVENOUS

## 2022-03-09 MED ORDER — cyanocobalamin (VITAMIN B-12) injection 1,000 mcg
1000 | Freq: Once | INTRAMUSCULAR | Status: AC
Start: 2022-03-09 — End: 2022-03-09
  Administered 2022-03-09: 21:00:00 1000 ug via INTRAMUSCULAR

## 2022-03-09 MED ORDER — magnesium sulfate in D5W 100 mL 1 gram/100 mL IVPB 1 g
1 | INTRAVENOUS | Status: AC | PRN
Start: 2022-03-09 — End: 2022-03-09
  Administered 2022-03-09 (×2): 1 g via INTRAVENOUS

## 2022-03-09 MED ORDER — sodium chloride 0.9% flush 10 mL
Freq: Every day | INTRAMUSCULAR | PRN
Start: 2022-03-09 — End: 2022-03-09

## 2022-03-09 MED ORDER — palonosetron (ALOXI) injection 0.25 mg
0.25 | Freq: Once | INTRAVENOUS | Status: AC
Start: 2022-03-09 — End: 2022-03-09
  Administered 2022-03-09: 15:00:00 0.25 mg via INTRAVENOUS

## 2022-03-09 MED ORDER — proCHLORPERazine (COMPAZINE) tablet 10 mg
5 | Freq: Four times a day (QID) | ORAL | PRN
Start: 2022-03-09 — End: 2022-03-09

## 2022-03-09 MED ORDER — LORazepam (ATIVAN) tablet 0.5 mg
0.5 | Freq: Four times a day (QID) | ORAL | PRN
Start: 2022-03-09 — End: 2022-03-09
  Administered 2022-03-09: 15:00:00 0.5 mg via ORAL

## 2022-03-09 MED ORDER — folic acid (FOLVITE) 1 MG tablet
1 | ORAL_TABLET | Freq: Every day | ORAL | 3 refills | Status: AC
Start: 2022-03-09 — End: ?

## 2022-03-09 MED ORDER — sodium chloride 0.9 % infusion
Freq: Every day | INTRAVENOUS | PRN
Start: 2022-03-09 — End: 2022-03-09

## 2022-03-09 MED ORDER — dexAMETHasone (DECADRON) tablet 8 mg
4 | Freq: Once | ORAL | Status: AC
Start: 2022-03-09 — End: 2022-03-09
  Administered 2022-03-09: 15:00:00 8 mg via ORAL

## 2022-03-09 MED ORDER — PEMEtrexed disodium (ALIMTA) 800 mg in sodium chloride 0.9 % 100 mL chemo infusion
100 | Freq: Once | INTRAVENOUS | Status: AC
Start: 2022-03-09 — End: 2022-03-09
  Administered 2022-03-09: 16:00:00 800 mg/m2 via INTRAVENOUS

## 2022-03-09 MED FILL — MAGNESIUM SULFATE 1 GRAM/100 ML IN DEXTROSE 5 % INTRAVENOUS PIGGYBACK: 1 1 gram/100 mL | INTRAVENOUS | Qty: 100

## 2022-03-09 MED FILL — PEMETREXED DISODIUM 100 MG INTRAVENOUS POWDER FOR SOLUTION: 100 100 mg | INTRAVENOUS | Qty: 32

## 2022-03-09 MED FILL — SODIUM CHLORIDE 0.45 % INTRAVENOUS SOLUTION: 0.45 0.45 % | INTRAVENOUS | Qty: 1000

## 2022-03-09 MED FILL — LORAZEPAM 0.5 MG TABLET: 0.5 0.5 MG | ORAL | Qty: 1

## 2022-03-09 MED FILL — CINVANTI 7.2 MG/ML INTRAVENOUS EMULSION: 7.2 7.2 mg/mL | INTRAVENOUS | Qty: 18

## 2022-03-09 MED FILL — FUROSEMIDE 10 MG/ML INJECTION SOLUTION: 10 10 mg/mL | INTRAMUSCULAR | Qty: 2

## 2022-03-09 MED FILL — CISPLATIN 1 MG/ML INTRAVENOUS SOLUTION: 1 1 mg/mL | INTRAVENOUS | Qty: 110

## 2022-03-09 MED FILL — CYANOCOBALAMIN (VIT B-12) 1,000 MCG/ML INJECTION SOLUTION: 1000 1,000 mcg/mL | INTRAMUSCULAR | Qty: 1

## 2022-03-09 MED FILL — DEXAMETHASONE 4 MG TABLET: 4 4 MG | ORAL | Qty: 2

## 2022-03-09 MED FILL — POTASSIUM CHLORIDE 2 MEQ/ML INTRAVENOUS SOLUTION: 2 2 mEq/mL | INTRAVENOUS | Qty: 10

## 2022-03-09 MED FILL — PALONOSETRON 0.25 MG/5 ML INTRAVENOUS SOLUTION: 0.25 0.25 mg/5 mL | INTRAVENOUS | Qty: 5

## 2022-03-09 NOTE — Unmapped (Signed)
Patient here for infusion services. Unsuccessful port access attempt x1 per L.Vinson Moselle, RN. Port accessed using sterile technique per protocol by [employer], Charity fundraiser. Positive blood return and flushes easily. Ordered labs released, obtained, and sent to lab for processing. Labs within treatment parameters. Magnesium 1.1 will require additional magnesium 2 grams IV per treatment plan. Okay to prepare medications sent to pharmacy.   1610 Administered pre-chemo IV hydration of potassium, magnesium, and 0.45 NS.   1005 Magnesium 1 gram IVPB administered.   1037 Premedications given.  1103 PRN dose of Ativan administered for anxiety.   1106 Magnesium 1 gram IVPB administered.   1212 Alimta administered and competed. Cispatin to be administered after 30 minutes.   1310 Cispaltin administered.   1517 Administered post chemo IV hydration of potassium, magnesium, and 0.45 NS.   1725 Vitamin B12 injection administered IM in right deltoid. Injection site cleansed with alcohol swab prior to injection. Band aid applied to injection site post injection.   Treatment complete, pt tolerated well, denies any side effects at this time. Please see flowsheet for post care VS. Port checked and noted positive blood return,  flushed with normal saline 10 mL and heparinized saline 5 mL. Port remains accessed, patient will be back tomorrow for appointment. Follow up appt verified. Pt discharged to home in baseline condition. AVS provided.

## 2022-03-10 ENCOUNTER — Ambulatory Visit: Admit: 2022-03-10 | Payer: MEDICARE

## 2022-03-10 DIAGNOSIS — C3412 Malignant neoplasm of upper lobe, left bronchus or lung: Secondary | ICD-10-CM

## 2022-03-10 MED ORDER — potassium chloride 20 mEq, magnesium sulfate 1 g in sodium chloride 0.45% (1/2 NS) 1,000 mL IV infusion
2 | INTRAVENOUS | Status: AC
Start: 2022-03-10 — End: 2022-03-10
  Administered 2022-03-10: 14:00:00 500 mL/h via INTRAVENOUS

## 2022-03-10 MED FILL — SODIUM CHLORIDE 0.45 % INTRAVENOUS SOLUTION: 0.45 0.45 % | INTRAVENOUS | Qty: 1000

## 2022-03-10 NOTE — Unmapped (Signed)
Pt at infusion clinic for hydration. Port already accessed, blood return noted.   0935 1 liter NS plus 20 mEq K over 2 hours started  1145 Infusion completed    Port flushed and hep locked. Port left accessed for infusion tomorrow. AVS declined. Pt left clinic in baseline condition.

## 2022-03-11 ENCOUNTER — Ambulatory Visit: Admit: 2022-03-11 | Payer: MEDICARE

## 2022-03-11 DIAGNOSIS — C3412 Malignant neoplasm of upper lobe, left bronchus or lung: Secondary | ICD-10-CM

## 2022-03-11 MED ORDER — potassium chloride 20 mEq, magnesium sulfate 1 g in sodium chloride 0.45% (1/2 NS) 1,000 mL IV infusion
0.45 | INTRAVENOUS | Status: AC
Start: 2022-03-11 — End: 2022-03-11
  Administered 2022-03-11: 14:00:00 500 mL/h via INTRAVENOUS

## 2022-03-11 MED ORDER — heparin lock flush 100 unit/mL syringe 500 Units
100 | Freq: Every day | INTRAVENOUS | PRN
Start: 2022-03-11 — End: 2022-03-11
  Administered 2022-03-11: 17:00:00 500 [IU] via INTRAVENOUS

## 2022-03-11 MED ORDER — sodium chloride 0.9% flush 10 mL
Freq: Every day | INTRAMUSCULAR | PRN
Start: 2022-03-11 — End: 2022-03-11
  Administered 2022-03-11: 17:00:00 10 mL via INTRAVENOUS

## 2022-03-11 MED FILL — SODIUM CHLORIDE 0.45 % INTRAVENOUS SOLUTION: 0.45 0.45 % | INTRAVENOUS | Qty: 1000

## 2022-03-11 NOTE — Unmapped (Signed)
Patient here for treatment. Pt came with port accessed, port flushed with blood return noted.      Pt has little nausea, little fatigue, decreased appetite, about the same. No other complains for this moment. Ok sent to pharmacy.     [id number] Hydration Original Treatment/Infusion plan, recent laboratory values, allergies, appropriateness of regimen, drug supplied, expiration dates, drug appearance, two patient identifiers were verified by this RN. Port checked and showed positive blood return. Medication hung on pump per [facility] order. Pt denies needs or side effects at this time. Education given on potential infusion reactions and to notify RN immediately if any symptoms noted.    1153 Hydration Treatment complete, pt tolerated well, denies any side effects at this time. Please see flowsheet for post care VS. Follow up appointment verified and port checked and noted positive blood return,  flushed and heparinized, access removed with tip intact. Pt discharged to home in baseline condition. Paper AVS with education provided, pt is aware AVS is available in their MyChart Chart.

## 2022-03-13 ENCOUNTER — Ambulatory Visit: Payer: MEDICARE

## 2022-03-16 ENCOUNTER — Inpatient Hospital Stay: Admit: 2022-03-16 | Payer: MEDICARE

## 2022-03-16 DIAGNOSIS — C3412 Malignant neoplasm of upper lobe, left bronchus or lung: Secondary | ICD-10-CM

## 2022-03-16 MED ORDER — heparin lock flush 100 unit/mL syringe
100 | INTRAVENOUS | Status: AC
Start: 2022-03-16 — End: 2022-03-16

## 2022-03-16 MED ORDER — sodium chloride 0.9 % infusion
INTRAVENOUS | Status: AC
Start: 2022-03-16 — End: 2022-03-16
  Administered 2022-03-16: 14:00:00

## 2022-03-16 MED ORDER — OMNIPAQUE (iohexol) 350 mg iodine/mL 100 mL
350 | Freq: Once | INTRAVENOUS | Status: AC | PRN
Start: 2022-03-16 — End: 2022-03-16
  Administered 2022-03-16: 14:00:00 100 mL via INTRAVENOUS

## 2022-03-16 MED FILL — SODIUM CHLORIDE 0.9 % INTRAVENOUS SOLUTION: INTRAVENOUS | Qty: 50

## 2022-03-16 MED FILL — HEPARIN LOCK FLUSH (PORCINE) (PF) 100 UNIT/ML INTRAVENOUS SYRINGE: 100 100 unit/mL | INTRAVENOUS | Qty: 5

## 2022-03-17 NOTE — Unmapped (Signed)
Chart reviewed for ongoing follow up for appointment with Dr. 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 Labs reviewed. Continue monitoring for toxicities  -continue with dose reduce cisplatin. If eGFR is recovered will plan to go back to full dose pemetrexed. C4D1 is scheduled for 03/09/2022  -continue alternating zofran and compazine every 3-4 hours as needed for ongoing nausea. Has since stopped taking olanzapine d/t restless leg  -will message our research team regarding timing for transition to the ALCHEMIST trial given her ALK mutation. Plan to discuss this with the patient at her next MD visit  -CT chest with contrast scheduled for 03/16/2022. Then RTC 11/10 for MD visit and port labs  -she will return for treatment on Monday. Receives IVF on days 2 and 3 of each cycle, however, would like IVF on day 1 of upcoming cycle. Orders placed for this today.   RTC 2 weeks

## 2022-03-20 ENCOUNTER — Ambulatory Visit: Admit: 2022-03-20 | Discharge: 2022-03-28 | Payer: MEDICARE

## 2022-03-20 ENCOUNTER — Ambulatory Visit: Admit: 2022-03-20 | Payer: MEDICARE

## 2022-03-20 DIAGNOSIS — C3412 Malignant neoplasm of upper lobe, left bronchus or lung: Secondary | ICD-10-CM

## 2022-03-20 DIAGNOSIS — Z452 Encounter for adjustment and management of vascular access device: Secondary | ICD-10-CM

## 2022-03-20 DIAGNOSIS — R29898 Other symptoms and signs involving the musculoskeletal system: Secondary | ICD-10-CM

## 2022-03-20 LAB — DIFFERENTIAL
Basophils Absolute: 13 /uL (ref 0–200)
Basophils Relative: 0.2 % (ref 0.0–1.0)
Eosinophils Absolute: 7 /uL — ABNORMAL LOW (ref 15–500)
Eosinophils Relative: 0.1 % (ref 0.0–8.0)
Lymphocytes Absolute: 2198 /uL (ref 850–3900)
Lymphocytes Relative: 33.3 % (ref 15.0–45.0)
Monocytes Absolute: 726 /uL (ref 200–950)
Monocytes Relative: 11 % (ref 0.0–12.0)
Neutrophils Absolute: 3656 /uL (ref 1500–7800)
Neutrophils Relative: 55.4 % (ref 40.0–80.0)
nRBC: 0 /100 WBC (ref 0–0)

## 2022-03-20 LAB — CBC
Hematocrit: 29.5 % — ABNORMAL LOW (ref 35.0–45.0)
Hemoglobin: 10.3 g/dL — ABNORMAL LOW (ref 11.7–15.5)
MCH: 32.8 pg (ref 27.0–33.0)
MCHC: 34.9 g/dL (ref 32.0–36.0)
MCV: 93.8 fL (ref 80.0–100.0)
MPV: 9.3 fL (ref 7.5–11.5)
Platelets: 108 10*3/uL — ABNORMAL LOW (ref 140–400)
RBC: 3.14 10*6/uL — ABNORMAL LOW (ref 3.80–5.10)
RDW: 16.7 % — ABNORMAL HIGH (ref 11.0–15.0)
WBC: 6.6 10*3/uL (ref 3.8–10.8)

## 2022-03-20 LAB — COMPREHENSIVE METABOLIC PANEL
ALT: 17 U/L (ref 7–52)
AST: 16 U/L (ref 13–39)
Albumin: 3.3 g/dL — ABNORMAL LOW (ref 3.5–5.7)
Alkaline Phosphatase: 50 U/L (ref 36–125)
Anion Gap: 6 mmol/L (ref 3–16)
BUN: 14 mg/dL (ref 7–25)
CO2: 24 mmol/L (ref 21–33)
Calcium: 8.6 mg/dL (ref 8.6–10.3)
Chloride: 106 mmol/L (ref 98–110)
Creatinine: 1.26 mg/dL (ref 0.60–1.30)
EGFR: 46
Glucose: 103 mg/dL — ABNORMAL HIGH (ref 70–100)
Osmolality, Calculated: 283 mOsm/kg (ref 278–305)
Potassium: 4.4 mmol/L (ref 3.5–5.3)
Sodium: 136 mmol/L (ref 133–146)
Total Bilirubin: 0.5 mg/dL (ref 0.0–1.5)
Total Protein: 5.7 g/dL — ABNORMAL LOW (ref 6.4–8.9)

## 2022-03-20 NOTE — Unmapped (Signed)
Pt arrived to infusion service for port labs. Port accessed using sterile technique, good blood return noted. Port flushed, heplocked and deaccessed with bandaid in place.. Labs collected and sent. Vital signs stable.

## 2022-03-20 NOTE — Unmapped (Signed)
Lab orders entered per orders from Dr. Zerita BoersLemmon. Infusion RN notified.

## 2022-03-20 NOTE — Unmapped (Signed)
11/10  Chart reviewed for ongoing navigation follow up.    Patient has completed treatment. Scans reviewed by MD.   6-8 week follow up.   Schedule as noted below. Will continue to follow and assist as needed.   Future Appointments   Date Time Provider Department Center   04/06/2022 10:00 AM Zollie Pee, Au.D. [facility] ENT [facility]   04/16/2022 11:30 AM Ivery Quale, MD [facility] PULM [facility]   05/08/2022 11:50 AM Hildred Priest, MD [facility]

## 2022-03-23 ENCOUNTER — Ambulatory Visit: Payer: MEDICARE

## 2022-03-24 NOTE — Unmapped (Signed)
[facility]  Patient instructed to arrive 930A, Explained procedure prep.  Confirmed with Britta MccreedyBarbara

## 2022-03-25 ENCOUNTER — Ambulatory Visit: Payer: MEDICARE | Attending: Pulmonary Disease

## 2022-03-27 ENCOUNTER — Encounter

## 2022-03-27 ENCOUNTER — Ambulatory Visit: Payer: MEDICARE

## 2022-03-27 MED ORDER — ondansetron (ZOFRAN) 8 MG tablet
8 | ORAL_TABLET | Freq: Three times a day (TID) | ORAL | 3 refills | Status: AC | PRN
Start: 2022-03-27 — End: ?

## 2022-03-27 NOTE — Unmapped (Signed)
Patient called back and lvm at 1536 that the fax # is [phone].     Faxed paperwork to this fax # and confirmation received.

## 2022-03-27 NOTE — Unmapped (Signed)
Called patient. Let her know that Joanna Lopez refilled the Zofran for her and recommended that she follow up with her PCP regarding her anxiety. Clarified the fax # for the PT office with her since continue getting fax failed back. Patient provided F [phone]. Faxed the PT orders and OV note to the # provided.

## 2022-03-27 NOTE — Unmapped (Signed)
PT order and LOV note faxed to the # requested by patient.

## 2022-03-27 NOTE — Unmapped (Signed)
Called patient. She is still taking paxil every day. Denies any vomiting after taking the pills in the evenings so doesn't think there were any missed doses related to her nausea/vomiting.     Explained to her that Adine MaduraLisa Gebhart CNP recommended that she try to take only 1 of the antiemetics. Patient said she would be willing to try to only take the ondansetron as she feels like it works best for her.     Discussed with her reaching out to her PCP or whoever manages her paxil regarding how bad her anxiety is. Or offered a referral to Dr. Letitia LibraJohnston here. She said she would reach out to her PCP.     Asked if we could still refill both the ondansetron and prochlorperazine in case 1 of the meds are not effective enough. Told her I would ask them to refill both.

## 2022-03-27 NOTE — Unmapped (Signed)
Called patient back as fax is not going through. She said she would call the PT place and verify the fax and call back. Patient confirmed she is not taking the Zyprexa anymore.

## 2022-03-27 NOTE — Unmapped (Signed)
Received a message from Elzie RingsJenn Smith, Dr. Lenard SimmerLemmon's nurse at [facility] that patient had called in and requested refills of prochlorperazine and ondansetron because she is still throwing up. She says patient also is requesting to have the PT order faxed to where she is getting her PT which is [phone].       Called patient. She's been out of the meds-prochlorperazine and ondansetron for a week.     Said she is mainly dry heaving which has been worse with her being out of medication. Says that nausea and vomiting has been going on since her last treatment. Said she is having to take the prochlorperazine and the ondansetron around the clock-alternating taking the ondansetron and then the prochlorperazine. Once or twice a day she is having severe dry heaving. 3-4 bottles of water a day -16 oz bottles when she takes her antiemetics. States without the antiemetics she has trouble being able to keep enough food and drink down.     She says she knows that the nausea, vomiting and diarrhea could be anxiety related rather than treatment related. She says she remembers when she lost her hair and Dr. Zerita BoersLemmon thought it was anxiety related rather than treatment related.     She has been having diarrhea on and off. Days that she has a lot of liquids she tends to have more diarrhea than days that she isn't able to drink a lot. Will take 1/2 imodium when she has the diarrhea and that will stop the diarrhea.     Explained sometimes patients need to come in for hydration when having this much nausea, vomiting and diarrhea and she said she doesn't think she needs to come in for that. She thinks as long as the medications are refilled that she will be fine.     Told her I would [facility] and call back with recommendations. Patient verbalized understanding.       Ondansetron 8 mg tab, take 1 tab po q8h prn for nausea  Last ordered 01/23/2022, quantity 30, refills 3    Prochlorperazine 10 mg tab, take 1 tab po q6h  prn  Last ordered 12/18/2021, quantity 30, refills 3    LOV 03/20/2022 [facility]  UOV 05/08/2022 [facility]

## 2022-03-30 ENCOUNTER — Encounter

## 2022-03-31 ENCOUNTER — Encounter

## 2022-04-01 ENCOUNTER — Encounter

## 2022-04-06 ENCOUNTER — Ambulatory Visit: Admit: 2022-04-06 | Discharge: 2022-04-06 | Payer: MEDICARE

## 2022-04-06 DIAGNOSIS — H9313 Tinnitus, bilateral: Secondary | ICD-10-CM

## 2022-04-06 NOTE — Unmapped (Signed)
Joanna Lopez  07/13/1950  [id number]    Ototoxic Monitoring Survey      12/31/2021     4:00 PM 02/13/2022     9:00 AM 04/06/2022    10:00 AM   AMB ENT OTOTOXIC MONITORING   Referral Source oncology - Dr. Levander Campion, MD C. Zerita Boers, MD   Test Interval Type Baseline Follow up Follow up   Test interval length  6 weeks after start of treatment 4 weeks since last, 3 mo since start of treatment   Hearing loss onset Feels hearing is normal Pt denies change Does not perceive any   Tinnitus? No Bilateral Bilateral   Family members with hearing loss Yes     If yes, who? Mother - later in life; worn hearing aids for 5-10 years     History of noise exposure? No     Any balance concerns- dizziness, vertigo, imbalance? slight since surgery; getting better Nothing new No new   Hearing aid user? No     Otoscopy? Non-occluding cerumen Non-occluding cerumen WNL   Tympanometry type Type A Au A A   Hearing Loss Grading N/A N/A 1   Tinnitus Grading N/A 1 1   Vertigo Grading N/A N/A N/A       Ototoxic Monitoring Air Conduction Results          04/06/2022    10:00   Air Conduction Results   125 Hz- Right 15   250 Hz- Right 0   500 Hz- Right 0   1000 Hz- Right 0   1500 Hz- Right 15   2000 Hz- Right 20   3000 Hz- Right 30   4000 Hz- Right 35   6000 Hz- Right 55   8000 Hz- Right 60   9000 Hz- Right 75   10 kHz- Right 75   11.2 kHz- Right 80   12.5 kHz- Right 85   14 kHz- Right NR   16 kHz- Right NR   18 kHz- Right NR   20 kHz- Right NR   125 Hz- Left 15   250 Hz- Left 10   500 Hz- Left 10   1000 Hz- Left 10   2000 Hz- Left 15   3000 Hz- Left 20   4000 Hz- Left 25   6000 Hz- Left 35   8000 Hz- Left 50   9000 Hz- Left 65   10 kHz- Left 70   11.2 kHz- Left 75   12.5 kHz- Left 70   14 kHz- Left 80   16 kHz- Left NR   18 kHz- Left NR   20 kHz- Left NR   Bone Conduction Results   500 Hz- Right 10   1000 Hz- Right 5   2000 Hz- Right 15   3000 Hz- Right 25   4000 Hz- Right 35   500 Hz- Left 10   1000 Hz- Left 5   2000 Hz- Left 15    3000 Hz- Left 20   4000 Hz- Left 15     Grading Scale for change hearing loss:  Grade 1- Adults enrolled on a Monitoring Program (on a 1, 2, 4, 3, 6, and 8 kHz audiogram): Threshold shift of 15 - 25 dB averaged at 2 contiguous test frequencies in at least one ear  Compared to 10/6 audiogram, there have presented the following changes:  21.7dB average shift at 4-8kHz in the Right ear, and 17.5dB average shift at 6&8kHz in the Left ear.  This  does meet the criteria of Grade 1 on the Grading Scale.  Results were confirmed today with second set of transducers.    Grading Scale Tinnitus:  Grade 1- Mild Symptoms Intervention not indicated  - pt does report continued bilateral/central tinnitus that she typically notices when the environment is quiet.    Grading Scale Vertigo:  None noted.    Results reviewed with patient, specifically the changes noted compared to last audiogram.  Pt reports understanding.  She agrees to the following plan:  results to be routed to Dr. Zerita Boers and nurse navigator for review and pt and her oncology provider may discuss options moving forward.  Regardless of future treatment plan, pt was escorted to front desk and scheduled to return in one month for ototoxicity monitoring audiogram.  Pt was encouraged to let providers know if she notices otologic changes before then.    Zollie Pee, AuD

## 2022-04-16 ENCOUNTER — Ambulatory Visit: Admit: 2022-04-16 | Discharge: 2022-04-23 | Payer: MEDICARE | Attending: Pulmonary Disease

## 2022-04-16 DIAGNOSIS — C3490 Malignant neoplasm of unspecified part of unspecified bronchus or lung: Secondary | ICD-10-CM

## 2022-04-16 NOTE — Unmapped (Unsigned)
History of Present Illness:      Joanna Lopez is a 71 y.o. female.    HPI    I saw Joanna Lopez last time in October 2015  She had another CT chest done - was seen by her PCP who called me with results  She feels the same  No major changes since last time I saw he  Patient denies hemoptysis, increased shortness of breath, chest pains, headaches, nausea, vomiting, diarrhea  No fevers or chills. 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 The patient provided verbal consent for an over the phone visit.    I spent 15 to 19 minutes  on this call conducting an interview, performing a limited exam by phone, and educating the patient on my assessment and plan.  Doing well  No issues since last I saw her  No arhythmias on the loop recorder    03/19/2020  Doing well  Happy  Had ct chest today    10/03/2020  Doing well she will go to [place] and [place] on a cruise in July  Enjoys life  Patient denies hemoptysis, increased shortness of breath, chest pains, headaches, nausea, vomiting, diarrhea  No fevers or chills. No new skin rashes    03/25/21  Doing well  Had a wonderful trip to [place] and [place]  Had ct chest    09/23/2021  Had ct chest  The lung nodule is slowly growing  She is feeling great    Review of Systems   Constitutional:  Positive for fatigue.   HENT:  Positive for postnasal drip.    Respiratory:  Positive for cough, shortness of breath and wheezing.    Gastrointestinal:  Positive for  bloating and diarrhea.   Musculoskeletal:  Positive for arthralgias.   Neurological:  Positive for tremors.   Hematological:  Bruises/bleeds easily.   Psychiatric/Behavioral:  The patient is nervous/anxious.    All other systems reviewed and are negative.      Objective:   Blood pressure (!) 142/94, pulse 99, height 5' 5.5 (1.664 m), weight 169 lb (76.7 kg), SpO2 93%.    Physical Exam    Nursing note and vitals reviewed.   Constitutional: Oriented to person, place, and time. Appears well-developed and well-nourished. No distress.   HENT: pupils equal, no scleral icterus, on conjunctivitis, no enlarged thyroid  Head: Normocephalic.    Ears: External ear normal.   Mouth/Throat: Oropharynx is clear and moist. No oropharyngeal exudate.   Eyes: Conjunctivae are normal. Pupils are equal, round, and reactive to light. Right eye exhibits no discharge. Left eye exhibits no discharge.   Neck: Normal range of motion. Neck supple. No JVD present. No tracheal deviation present. No thyromegaly present.   Cardiovascular: Normal rate, regular rhythm, normal heart sounds and intact distal pulses.  Exam reveals no gallop and no friction rub.   No murmur heard.   Pulmonary/Chest: Effort normal and breath sounds normal. No stridor. No respiratory distress. No wheezes. No rales. Exhibits no tenderness.   Abdominal:Exhibits no distension and no mass. There is no tenderness. There is no rebound and no guarding.   Musculoskeletal: exhibits no edema and no tenderness.   Neurological: alert and oriented to person, place, and time. normal reflexes. No cranial nerve deficit. Coordination normal.   Skin: Warm and dry. Not diaphoretic. No erythema.   Psychiatric: Normal mood and affect. Behavior is normal. Judgment and thought content normal.       Exam: CT CHEST WO CONTRAST dated 08/14/2016 10:17 AM EDT     CLINICAL HISTORY: lung nodules;      COMPARISON: February 07, 2014     TECHNIQUE: Multidetector  CT imaging was obtained through the chest  in the supine position without intravenous contrast. The images were reconstructed with 5 and 1 mm collimation using a 38 cm field of view.     FINDINGS: The lung windows show that the central airways are patent. Small calcified nodules reflect healed granulomatous infection.     The well-defined noncalcified nodule in the medial segment right middle lobe measures approximately 2.1 x 1.8 cm on series 5 image 214, previously 1.7 x 1.5 cm on February 07, 2014. The central portion of this nodule is fluid attenuation.     The irregular ill-defined subsolid nodule in the superior lingular subsegment of the left upper lobe on series 5 image 193 measures approximately 0.9 x 0.4 cm.     A tiny subpleural nodule at the left apex laterally on series 5 image 73 is unchanged. There are no new lesions.     The mediastinal windows show granulomatous calcification but otherwise no lymphadenopathy.     No coronary artery calcifications are visible. The caliber of the great vessels is normal.     Limited noncontrast imaging through the upper abdomen shows multiple low-attenuation liver lesions, similar to prior studies, likely cysts. A fluid attenuation lesion centrally in the left kidney is larger compared to 2014 but likely a peripelvic cyst.     The bone windows show no suspicious lesions.        IMPRESSION:     While predominantly fluid attenuation, the right middle lobe nodule is mildly increased in size compared to 3 years previously. This is most likely a benign lesion such as granuloma or hamartoma. The fluid attenuation makes carcinoid tumor less likely. Nevertheless, due to the interval growth a follow-up CT in 12 months should be considered.     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 This can be followed at the same time.     Report Verified by: Rick Duff, M.D. at 08/14/2016 12:53 PM EDT      EXAM: CT CHEST WO  CONTRAST      INDICATION: Abnormal xray - lung nodule, < 1 cm, mod-high risk,      COMPARISON: CT of the chest from 10/24/2018.     TECHNIQUE: Multidetector CT imaging was obtained through the chest in the supine position without intravenous contrast. Additional axial MIP images were reconstructed.       FINDINGS:   Medical devices: Loop recorder in the anterior left chest wall, unchanged.     Airways & lungs: The central airways are patent. Stable postsurgical changes related to  right middle lobectomy. Previously demonstrated pulmonary nodules are stable including the subsolid nodule in the lateral left upper lobe. No new or enlarging pulmonary nodules. Upper lobe predominant emphysema, unchanged.     Pleura: No pleural effusions or pneumothorax.     Lower Neck: Unremarkable.     Heart: The heart is normal in size.     Vascular structures: Aorta is normal in caliber. Main pulmonary artery is normal in caliber.     Mediastinum and hila: No pathologically enlarged lymph nodes.     Chest wall and axilla: Unremarkable.     Upper abdomen: Low-attenuation lesions in the liver, unchanged and most likely cysts.     Musculoskeletal: No suspicious osteolytic or osteoblastic lesions.        IMPRESSION:     1.  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 Recommend follow-up low-dose chest CT in 12 months.     Report Verified by: Essie Hart, MD at 04/13/2019 3:03 PM EST    EXAM: CT CHEST WO CONTRAST     INDICATION: Lung nodule, > 8mm     COMPARISON: CT chest dated April 13, 2019, October 24, 2018, May 31, 2018, CT chest dated February 07, 2014.     TECHNIQUE: Multidetector CT imaging was obtained through the chest in the supine position without intravenous contrast. Additional axial MIP images were reconstructed.       FINDINGS:   Medical devices: Left apical there is seen in the subcutaneous tissue of the left anterior chest wall.      Airways & lungs: Stable postsurgical changes of right middle lobectomy. Central airways are patent. Mild apical predominant emphysema. Few scattered calcified granulomas. Seen is spiculated nodule in the left upper lobe with tiny central area of lucency measuring approximately 9 x 6 mm (image #184, series 4), equivocally increase in size compared to study dated May 10, 2019 and definitely increased in size compared to study dated February 07, 2014. Additional subsolid nodule appears unchanged. No new pulmonary nodule. Bibasilar atelectasis. Lungs are otherwise clear.     Pleura: No pleural effusions or pneumothorax.     Lower Neck: Unremarkable.     Heart: The heart is normal in size.     Vascular structures: Mild calcification of aorta and branch vessels. The vascular structures otherwise unremarkable. Main pulmonary artery is normal in caliber.     Mediastinum and hila: No pathologically enlarged lymph nodes. Calcified right hilar and mediastinal lymph nodes.     Chest wall and axilla: Unremarkable.     Upper abdomen: Again seen are multiple hepatic cyst.     Musculoskeletal: No suspicious osteolytic or osteoblastic lesions.     Impression   IMPRESSION:     1.  Subtle increase in size of subsolid nodule in the left upper lobe measuring 9 x 6 mm. Follow-up is suggested in 6 months to ensure stability.   2.  Mild emphysema.       Report Verified by: Lana Fish, MD at 03/19/2020 2:15 PM EST         Assessment:     Lung nodules, carcinoid, s/p resection VATS RML  Lung nodule LUL     Plan:      Patient with a large nodule RML that on path was typical carcinoid  The LUL nodule is maybe slightly denser, images reviewed, discussed with patient, still question of adenocarcinoma  Location is difficult for a wedge. 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 Discussed to see Dr Harriette Bouillon   PFTs reviewed, will repeat at this time     Overall clinically she looks very stable

## 2022-04-17 ENCOUNTER — Encounter

## 2022-04-17 ENCOUNTER — Ambulatory Visit: Payer: MEDICARE

## 2022-04-20 ENCOUNTER — Encounter

## 2022-04-21 ENCOUNTER — Inpatient Hospital Stay: Payer: MEDICARE

## 2022-04-21 ENCOUNTER — Encounter

## 2022-04-22 ENCOUNTER — Encounter

## 2022-04-27 ENCOUNTER — Encounter

## 2022-04-27 ENCOUNTER — Ambulatory Visit: Admit: 2022-04-27 | Discharge: 2022-04-27 | Payer: MEDICARE

## 2022-04-27 DIAGNOSIS — H903 Sensorineural hearing loss, bilateral: Secondary | ICD-10-CM

## 2022-04-27 DIAGNOSIS — H9313 Tinnitus, bilateral: Secondary | ICD-10-CM

## 2022-04-27 NOTE — Unmapped (Signed)
Tida Saner Gali  01/26/1951  [id number]    Ototoxic Monitoring Survey      12/31/2021     4:00 PM 02/13/2022     9:00 AM 04/06/2022    10:00 AM 04/27/2022    11:00 AM   AMB ENT OTOTOXIC MONITORING   Referral Source oncology - Dr. Levander Campion, MD C. Zerita Boers, MD C. Zerita Boers, MD   Test Interval Type Baseline Follow up Follow up Follow up   Test interval length  6 weeks after start of treatment 4 weeks since last, 3 mo since start of treatment 3 weeks since last, 16 weeks since start of treatment   Hearing loss onset Feels hearing is normal Pt denies change Does not perceive any    Tinnitus? No Bilateral Bilateral Bilateral   Family members with hearing loss Yes      If yes, who? Mother - later in life; worn hearing aids for 5-10 years      History of noise exposure? No      Any balance concerns- dizziness, vertigo, imbalance? slight since surgery; getting better Nothing new No new None noted   Hearing aid user? No      Otoscopy? Non-occluding cerumen Non-occluding cerumen WNL WNL   Tympanometry type Type A Au A A A   Hearing Loss Grading N/A N/A 1 N/A   Tinnitus Grading N/A 1 1 1    Vertigo Grading N/A N/A N/A N/A       Ototoxic Monitoring Air Conduction Results          04/06/2022    10:00 04/27/2022    11:00   Air Conduction Results   125 Hz- Right 15 10   250 Hz- Right 0 5   500 Hz- Right 0 0   1000 Hz- Right 0 5   1500 Hz- Right 15 15   2000 Hz- Right 20 25   3000 Hz- Right 30 40   4000 Hz- Right 35 45   6000 Hz- Right 55 60   8000 Hz- Right 60 65   9000 Hz- Right 75 75   10 kHz- Right 75 70   11.2 kHz- Right 80 75   12.5 kHz- Right 85 80   14 kHz- Right NR NR   16 kHz- Right NR NR   18 kHz- Right NR NR   20 kHz- Right NR NR   125 Hz- Left 15 15   250 Hz- Left 10 10   500 Hz- Left 10 15   1000 Hz- Left 10 10   2000 Hz- Left 15 10   3000 Hz- Left 20 25   4000 Hz- Left 25 30   6000 Hz- Left 35 40   8000 Hz- Left 50 55   9000 Hz- Left 65 65   10 kHz- Left 70 70   11.2 kHz- Left 75 75   12.5 kHz- Left 70  75   14 kHz- Left 80 NR   16 kHz- Left NR NR   18 kHz- Left NR NR   20 kHz- Left NR NR   Bone Conduction Results   500 Hz- Right 10 5   1000 Hz- Right 5 5   2000 Hz- Right 15 25   3000 Hz- Right 25 40   4000 Hz- Right 35 35   500 Hz- Left 10 5   1000 Hz- Left 5 5   2000 Hz- Left 15 10   3000 Hz- Left 20 20   4000 Hz-  Left 15 20     Ototoxicity Follow-Up    Grading Scale for change hearing loss:  Grade 1- Adults enrolled on a Monitoring Program (on a 1, 2, 4, 3, 6, and 8 kHz audiogram): Threshold shift of 15 - 25 dB averaged at 2 contiguous test frequencies in at least one ear, grossly stable since last test on 11/27 (10dB changes at [facility] and 4kHz AD).  See 11/27 note for exact changes since baseline.    Grading Scale Tinnitus:  Grade 1- Mild Symptoms Intervention not indicated     Grading Scale Vertigo:  None noted    Results reviewed with patient.  Pt reports she stopped treatment at the end of November.  Pt was escorted to the front desk and scheduled to return in June for repeat testing.  Results routed to C. Zerita Boers, MD and Delene Ruffini, RN for review.    Zollie Pee, AuD

## 2022-05-01 ENCOUNTER — Ambulatory Visit: Payer: MEDICARE

## 2022-05-01 ENCOUNTER — Encounter

## 2022-05-05 ENCOUNTER — Encounter

## 2022-05-08 ENCOUNTER — Ambulatory Visit: Admit: 2022-05-08 | Discharge: 2022-05-08 | Payer: MEDICARE

## 2022-05-08 ENCOUNTER — Ambulatory Visit: Admit: 2022-05-08 | Payer: MEDICARE

## 2022-05-08 DIAGNOSIS — C3412 Malignant neoplasm of upper lobe, left bronchus or lung: Secondary | ICD-10-CM

## 2022-05-08 LAB — DIFFERENTIAL
Basophils Absolute: 22 /uL (ref 0–200)
Basophils Relative: 0.5 % (ref 0.0–1.0)
Eosinophils Absolute: 13 /uL — ABNORMAL LOW (ref 15–500)
Eosinophils Relative: 0.3 % (ref 0.0–8.0)
Lymphocytes Absolute: 882 /uL (ref 850–3900)
Lymphocytes Relative: 20.5 % (ref 15.0–45.0)
Monocytes Absolute: 757 /uL (ref 200–950)
Monocytes Relative: 17.6 % — ABNORMAL HIGH (ref 0.0–12.0)
Neutrophils Absolute: 2627 /uL (ref 1500–7800)
Neutrophils Relative: 61.1 % (ref 40.0–80.0)
nRBC: 0 /100 WBC (ref 0–0)

## 2022-05-08 LAB — COMPREHENSIVE METABOLIC PANEL
ALT: 21 U/L (ref 7–52)
AST: 28 U/L (ref 13–39)
Albumin: 3.8 g/dL (ref 3.5–5.7)
Alkaline Phosphatase: 52 U/L (ref 36–125)
Anion Gap: 7 mmol/L (ref 3–16)
BUN: 23 mg/dL (ref 7–25)
CO2: 26 mmol/L (ref 21–33)
Calcium: 8.9 mg/dL (ref 8.6–10.3)
Chloride: 104 mmol/L (ref 98–110)
Creatinine: 1.23 mg/dL (ref 0.60–1.30)
EGFR: 47
Glucose: 89 mg/dL (ref 70–100)
Osmolality, Calculated: 287 mOsm/kg (ref 278–305)
Potassium: 4.1 mmol/L (ref 3.5–5.3)
Sodium: 137 mmol/L (ref 133–146)
Total Bilirubin: 0.5 mg/dL (ref 0.0–1.5)
Total Protein: 6.4 g/dL (ref 6.4–8.9)

## 2022-05-08 LAB — CBC
Hematocrit: 33.7 % — ABNORMAL LOW (ref 35.0–45.0)
Hemoglobin: 11.5 g/dL — ABNORMAL LOW (ref 11.7–15.5)
MCH: 33.4 pg — ABNORMAL HIGH (ref 27.0–33.0)
MCHC: 34 g/dL (ref 32.0–36.0)
MCV: 98.3 fL (ref 80.0–100.0)
MPV: 9.4 fL (ref 7.5–11.5)
Platelets: 109 10*3/uL — ABNORMAL LOW (ref 140–400)
RBC: 3.43 10*6/uL — ABNORMAL LOW (ref 3.80–5.10)
RDW: 14.5 % (ref 11.0–15.0)
WBC: 4.3 10*3/uL (ref 3.8–10.8)

## 2022-05-08 MED ORDER — sodium chloride 0.9% flush 10 mL
Freq: Every day | INTRAMUSCULAR | PRN
Start: 2022-05-08 — End: 2022-05-08
  Administered 2022-05-08: 18:00:00 10 mL via INTRAVENOUS

## 2022-05-08 MED ORDER — heparin lock flush 100 unit/mL syringe 500 Units
100 | Freq: Every day | INTRAVENOUS | PRN
Start: 2022-05-08 — End: 2022-05-08
  Administered 2022-05-08: 18:00:00 500 [IU] via INTRAVENOUS

## 2022-05-08 NOTE — Unmapped (Signed)
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)  Staging form: Lung, AJCC 8th Edition  - Clinical: No stage assigned - Unsigned  - Pathologic stage from 12/11/2016: Stage IA2 (pT1b, pN0, cM0) - Signed by Janett Billow, MD on 12/11/2016    Malignant neoplasm of upper lobe of left lung (CMS-HCC)  Staging form: Lung, AJCC 8th Edition  - Pathologic stage from 12/04/2021: Stage IIB (pT1a, pN1, cM0) - Signed by Janett Billow, MD on 12/04/2021      GENOMICS:  EGFR mt negative  PD-L1 95%  ALK-EML4 fusion positive per ALCHEMIST screening trial testing    SPECIAL MEDICAL ISSUES: 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 11/18/21: Left VATS lingulectomy  2. 01/02/22 - 03/09/22: Adjuvant cisplatin, pemetrexed (C2+ dose reduced due to intolerance - n/v/fatigue)    ECOG PERFORMANCE STATUS: (1) Restricted in physically strenuous activity but ambulatory and able to carry out work of a light or sedentary nature, e.g., light house work, office work      INTERVAL HISTORY:  Joanna Lopez is here for follow up of her lung cancer. She is still having issues from her treatment with fatigue, but doing better each day. She recently strained herself cooking Christmas dinner. Breathing is stable with no changes, only DOE with stairs or a lot of activity. No other complaints today.      MEDICATIONS:  Current Outpatient Medications   Medication Sig    aspirin Take 1 tablet (325 mg total) by mouth daily.    lidocaine-prilocaine Apply topically if needed.    lipase/protease/amylase (CREON ORAL) Take 1 capsule by mouth if needed (stomach  pain).    lisinopriL Take 1 tablet (10 mg total) by mouth daily. 10 mg (half tablet) daily    MELATONIN ORAL Take 3-5 mg by mouth at bedtime. PRN for sleep    multivitamin Take 1 tablet by mouth daily.    naloxone Apply 1 spray in one nostril if needed. Call 911. May repeat dose in other nostril if no response in 3 minutes.    PARoxetine Take 1 tablet (20 mg total) by mouth daily.    roPINIRole Take 1 tablet (0.5 mg total) by mouth daily.    aspirin-acetaminophen-caffeine Take 1 tablet by mouth every 6 hours as needed for Pain.    atorvastatin Take 1 tablet (80 mg total) by mouth daily.    busPIRone Take 1 tablet (5 mg total) by mouth 2 times a day. Pt unsure of dosage    calcium carbonate (CALCIUM 300 ORAL) Take by mouth.    dexAMETHasone Take 4 mg twice a day for five days starting the day before pemetrexed.    diclofenac 1 drop if needed (ear itching). ears    folic acid Take 1 tablet (1 mg total) by mouth daily. Start 1 week before first cycle and continue for 21 days after the last dose of pemetrexed.    OLANZapine Take 1 tablet (10 mg total) by mouth at bedtime. Indications: Prevention of Chemotherapy-Induced Nausea and  Vomiting    OLANZapine Take 1 tablet (15 mg total) by mouth at bedtime. Indications: Prevention of Chemotherapy-Induced Nausea and Vomiting    ondansetron Take 1 tablet (8 mg total) by mouth every 8 hours as needed for Nausea.    proCHLORPERazine Take 1 tablet (10 mg total) by mouth every 6 hours as needed (For nausea and vomiting).     No current facility-administered medications for this visit.       ALLERGIES:    Penicillins, Adhesive, Formaldehyde analogues, Nsaids (non-steroidal anti-inflammatory drug), Promethazine hcl, Latex, and Sulfa (sulfonamide antibiotics)    REVIEW OF SYSTEMS:  Review of Systems   Constitutional:  Positive for malaise/fatigue. Negative for chills, diaphoresis, fever and weight loss.   HENT:  Negative for congestion, hearing loss, sore throat and tinnitus.    Eyes:   Negative for blurred vision and double vision.   Respiratory:  Negative for cough, hemoptysis and shortness of breath.    Cardiovascular:  Negative for chest pain, palpitations and leg swelling.   Gastrointestinal:  Negative for abdominal pain, constipation, diarrhea, nausea and vomiting.   Genitourinary:  Negative for dysuria, flank pain and urgency.   Musculoskeletal:  Negative for joint pain, myalgias and neck pain.   Skin:  Negative for itching and rash.   Neurological:  Negative for dizziness, weakness and headaches.   Psychiatric/Behavioral:  Negative for depression, substance abuse and suicidal ideas. The patient is not nervous/anxious.    All other systems reviewed and are negative.      PHYSICAL EXAMINATION:  Vitals:    05/08/22 1157   BP: 121/83   BP Location: Left upper arm   Pulse: 111   Resp: 16   Temp: 97.5 F (36.4 C)   TempSrc: Temporal   SpO2: 97%   Weight: 165 lb 6.4 oz (75 kg)   Height: 5' 6.5 (1.689 m)    Body surface area is 1.88 meters squared.    Physical Exam  Constitutional:       General: She is not in acute distress.     Appearance: Normal appearance. She is normal weight. She is not ill-appearing.   HENT:      Head: Normocephalic and atraumatic.      Nose: Nose normal.      Mouth/Throat:      Mouth: Mucous membranes are moist.      Pharynx: Oropharynx is clear.   Eyes:      General: No scleral icterus.     Extraocular Movements: Extraocular movements intact.      Conjunctiva/sclera: Conjunctivae normal.   Cardiovascular:      Rate and Rhythm: Normal rate.      Pulses: Normal pulses.   Pulmonary:      Effort: Pulmonary effort is normal.   Abdominal:      General: Abdomen is flat. There is no distension.   Musculoskeletal:         General: No swelling or tenderness. Normal range of motion.      Cervical back: Normal range of motion.   Skin:     General: Skin is warm and dry.      Coloration: Skin is not jaundiced.      Findings: No rash.   Neurological:      General: No focal deficit  present.      Mental Status: She is alert and oriented to person, place, and time. Mental status is at baseline.      Cranial Nerves: No cranial nerve deficit.  Gait: Gait normal.   Psychiatric:         Mood and Affect: Mood normal.         Behavior: Behavior normal.         Thought Content: Thought content normal.       LABORATORY DATA:  Lab Results   Component Value Date    WBC 4.3 05/08/2022    RBC 3.43 (L) 05/08/2022    HGB 11.5 (L) 05/08/2022    HCT 33.7 (L) 05/08/2022    MCV 98.3 05/08/2022    MCH 33.4 (H) 05/08/2022    MCHC 34.0 05/08/2022    RDW 14.5 05/08/2022    PLT 109 (L) 05/08/2022        Lab Results   Component Value Date    GLUCOSE 89 05/08/2022    BUN 23 05/08/2022    CO2 26 05/08/2022    CREATININE 1.23 05/08/2022    K 4.1 05/08/2022    NA 137 05/08/2022    CL 104 05/08/2022    CALCIUM 8.9 05/08/2022    ALBUMIN 3.8 05/08/2022    PROT 6.4 05/08/2022    ALKPHOS 52 05/08/2022    ALT 21 05/08/2022    AST 28 05/08/2022    BILITOT 0.5 05/08/2022     No results found for: TSH, T3TOTAL, T4TOTAL, T3FREE, FREET4, THYROIDAB     RADIOLOGY REVIEW:  Images personally reviewed and compared to priors  No results found.    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 Still with some ongoing issues but improving. Discussed her imaging results again and possible participation in ALCHEMIST clinical trial. She wishes to hold off on this for now. Per discussion with the research team, she has until April to enroll if she wishes. Will plan for reimaging in Feb/March to follow up on nodules and for surveillance and will discuss again at that visit.   - Labs checked today showing ongoing thrombocytopenia. Likely a result of chemotherapy treatment and will monitor for now. Other labs normalized or near normal.   - Will continue with surveillance imaging every 3 months.  - Follow up in late Feb/early March with imaging prior.    Any  elements of the HPI and Assessment/Plan copied from prior notes have been updated where appropriate and all reflect current medical decision making from today, 05/08/2022. Physical examination listed above was completed in entirety today, 05/08/2022.    Hildred Priest, MD  Thoracic and Head & Neck Oncology  [facility] [place] [facility]

## 2022-05-08 NOTE — Unmapped (Signed)
Pt here for lab draw and port flush. Accessed port per sterile procedure, blood returned, drew off 10ml blood for discard then drew labs. Flushed with 20ml  NS and 5ml  heplock, removed needle with tip intact. Bandaid placed and pt released in stable condition.

## 2022-05-08 NOTE — Unmapped (Signed)
12/29   Chart reviewed.   Completed treatment. Unsure if she wants to do ALCHEMIST trial or not. Still thinking about it.     Follow up end of feb or early march with Cts prior.     Message sent to scheduling to adjust appointments.       Future Appointments   Date Time Provider Department Center   05/18/2022  7:30 AM [facility] IR 1 [facility] IR [facility] Imaging   08/12/2022 12:40 PM NORTH CT 1 [facility] CT [facility] Imaging   08/14/2022 10:30 AM Hildred Priest, MD [facility]   10/20/2022  3:50 PM Ivery Quale, MD [facility] PULM [facility]   10/27/2022  1:30 PM Zollie Pee, Au.D. [facility] ENT [facility]

## 2022-05-08 NOTE — Unmapped (Signed)
I called to change appt. Date and time and also the CT scans. To let Joanna Lopez know that we need to reschedule per Dr. Zerita BoersLemmon request.

## 2022-05-12 ENCOUNTER — Encounter

## 2022-05-14 NOTE — Unmapped (Signed)
1/4  Chart reviewed for ongoing navigation follow up. Appointments rescheduled.    Pt without navigation needs at this time. Will discontinue active navigation services and remain available as needed. .      Schedule as noted below.   Future Appointments   Date Time Provider [place]   05/18/2022  7:30 AM [facility] IR 1 [facility] IR [facility] Imaging   07/08/2022 11:00 AM [facility] CT 3 [place] [facility] CT [facility] Imaging   07/10/2022 10:30 AM Roxy Horseman, MD [place] [facility]   08/14/2022 10:30 AM Roxy Horseman, MD [place] [facility]   10/20/2022  3:50 PM Martin Majestic, MD [place] [facility]   10/27/2022  1:30 PM Ernestine Conrad, Au.D. [facility] ENT [place]

## 2022-05-14 NOTE — Unmapped (Signed)
Hey team today/ this morning I spoked to Joanna Lopez about changing her appointment to the correct dates that was requested by the doctor. The changes was made to the following: CT scans Wednesday February 28th @ 11am with a 10:45 arrival. Follow up appointment Friday March 1st @ 10:30am

## 2022-05-14 NOTE — Unmapped (Signed)
Patient of Dr. Fabio Asa calling the office today asking for information on port removal. She states she needs help scheduling this.

## 2022-05-14 NOTE — Unmapped (Signed)
Called patient. She said she is still not feeling well. She said that she wants to push back her port removal till she feels better. Provided her with the [facility] # and instructed to hit opt 1. Patient verbalized understanding.

## 2022-05-18 ENCOUNTER — Inpatient Hospital Stay: Payer: MEDICARE | Attending: Diagnostic Radiology

## 2022-06-05 LAB — COLOGUARD TEST (STOOL): Cologuard: NEGATIVE

## 2022-07-08 ENCOUNTER — Inpatient Hospital Stay: Admit: 2022-07-08 | Discharge: 2022-08-14 | Payer: MEDICARE

## 2022-07-08 DIAGNOSIS — C3412 Malignant neoplasm of upper lobe, left bronchus or lung: Secondary | ICD-10-CM

## 2022-07-08 LAB — BUN: BUN: 32 mg/dL — ABNORMAL HIGH (ref 7–25)

## 2022-07-08 LAB — CREATININE, SERUM
Creatinine: 1.16 mg/dL (ref 0.60–1.30)
EGFR: 50

## 2022-07-08 MED ORDER — heparin lock flush 100 unit/mL syringe
100 | INTRAVENOUS | Status: AC
Start: 2022-07-08 — End: 2022-07-08
  Administered 2022-07-08: 19:00:00 5

## 2022-07-08 MED ORDER — OMNIPAQUE (iohexol) 350 mg iodine/mL 100 mL
350 | Freq: Once | INTRAVENOUS | Status: AC | PRN
Start: 2022-07-08 — End: 2022-07-08
  Administered 2022-07-08: 19:00:00 100 mL via INTRAVENOUS

## 2022-07-08 MED ORDER — sodium chloride 0.9 % infusion
INTRAVENOUS | Status: AC
Start: 2022-07-08 — End: 2022-07-08
  Administered 2022-07-08: 19:00:00 50

## 2022-07-08 MED FILL — HEPARIN LOCK FLUSH (PORCINE) (PF) 100 UNIT/ML INTRAVENOUS SYRINGE: 100 100 unit/mL | INTRAVENOUS | Qty: 5

## 2022-07-08 MED FILL — SODIUM CHLORIDE 0.9 % INTRAVENOUS SOLUTION: INTRAVENOUS | Qty: 50

## 2022-07-10 ENCOUNTER — Ambulatory Visit: Payer: MEDICARE

## 2022-08-12 ENCOUNTER — Ambulatory Visit: Payer: MEDICARE

## 2022-08-14 ENCOUNTER — Ambulatory Visit: Admit: 2022-08-14 | Discharge: 2022-08-14 | Payer: MEDICARE

## 2022-08-14 DIAGNOSIS — C3412 Malignant neoplasm of upper lobe, left bronchus or lung: Secondary | ICD-10-CM

## 2022-08-14 DIAGNOSIS — Z452 Encounter for adjustment and management of vascular access device: Secondary | ICD-10-CM

## 2022-08-14 LAB — DIFFERENTIAL
Basophils Absolute: 69 /uL (ref 0–200)
Basophils Relative: 0.7 % (ref 0.0–1.0)
Eosinophils Absolute: 178 /uL (ref 15–500)
Eosinophils Relative: 1.8 % (ref 0.0–8.0)
Lymphocytes Absolute: 2426 /uL (ref 850–3900)
Lymphocytes Relative: 24.5 % (ref 15.0–45.0)
Monocytes Absolute: 1049 /uL — ABNORMAL HIGH (ref 200–950)
Monocytes Relative: 10.6 % (ref 0.0–12.0)
Neutrophils Absolute: 6178 /uL (ref 1500–7800)
Neutrophils Relative: 62.4 % (ref 40.0–80.0)
nRBC: 0 /100 WBC (ref 0–0)

## 2022-08-14 LAB — COMPREHENSIVE METABOLIC PANEL
ALT: 21 U/L (ref 7–52)
AST: 21 U/L (ref 13–39)
Albumin: 4 g/dL (ref 3.5–5.7)
Alkaline Phosphatase: 60 U/L (ref 36–125)
Anion Gap: 7 mmol/L (ref 3–16)
BUN: 28 mg/dL — ABNORMAL HIGH (ref 7–25)
CO2: 25 mmol/L (ref 21–33)
Calcium: 9.5 mg/dL (ref 8.6–10.3)
Chloride: 107 mmol/L (ref 98–110)
Creatinine: 1.12 mg/dL (ref 0.60–1.30)
EGFR: 52
Glucose: 80 mg/dL (ref 70–100)
Osmolality, Calculated: 292 mOsm/kg (ref 278–305)
Potassium: 4.4 mmol/L (ref 3.5–5.3)
Sodium: 139 mmol/L (ref 133–146)
Total Bilirubin: 0.5 mg/dL (ref 0.0–1.5)
Total Protein: 6.7 g/dL (ref 6.4–8.9)

## 2022-08-14 LAB — CBC
Hematocrit: 37.5 % (ref 35.0–45.0)
Hemoglobin: 12.6 g/dL (ref 11.7–15.5)
MCH: 31.5 pg (ref 27.0–33.0)
MCHC: 33.7 g/dL (ref 32.0–36.0)
MCV: 93.5 fL (ref 80.0–100.0)
MPV: 9.2 fL (ref 7.5–11.5)
Platelets: 144 10*3/uL (ref 140–400)
RBC: 4.01 10*6/uL (ref 3.80–5.10)
RDW: 13.9 % (ref 11.0–15.0)
WBC: 9.9 10*3/uL (ref 3.8–10.8)

## 2022-08-14 MED ORDER — alectinib 150 mg Cap
150 | ORAL_CAPSULE | Freq: Two times a day (BID) | ORAL | 11 refills | Status: AC
Start: 2022-08-14 — End: ?
  Filled 2022-10-02: qty 240, 30d supply, fill #0

## 2022-08-14 NOTE — Telephone Encounter (Signed)
[facility] Note    Prescription received for: alectinib    Insurance rejection message: PA required    Insurance prior authorization process started via: CMM    Using EPIC chart as reference, provided requested information to insurance including:  diagnosis code and treatment history    Claim status: submitted.  Response expected in 24-72 hours.

## 2022-08-14 NOTE — Progress Notes (Signed)
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)  Staging form: Lung, AJCC 8th Edition  - Clinical: No stage assigned - Unsigned  - Pathologic stage from 12/11/2016: Stage IA2 (pT1b, pN0, cM0) - Signed by Janett Billow, MD on 12/11/2016    Malignant neoplasm of upper lobe of left lung (CMS-HCC)  Staging form: Lung, AJCC 8th Edition  - Pathologic stage from 12/04/2021: Stage IIB (pT1a, pN1, cM0) - Signed by Janett Billow, MD on 12/04/2021      GENOMICS:  EGFR mt negative  PD-L1 95%  ALK-EML4 fusion positive per ALCHEMIST screening trial testing    SPECIAL MEDICAL ISSUES: 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 11/18/21: Left VATS lingulectomy  2. 01/02/22 - 03/09/22: Adjuvant cisplatin, pemetrexed (C2+ dose reduced due to intolerance - n/v/fatigue)    ECOG PERFORMANCE STATUS: (1) Restricted in physically strenuous activity but ambulatory and able to carry out work of a light or sedentary nature, e.g., light house work, office work      INTERVAL HISTORY:  SELETA HOVLAND is here for follow up of her lung cancer. She is doing great and feels well. No breathing issues. She is playing pickleball. She recently traveled and met her biological mother and siblings. No other complaints or concerns.    MEDICATIONS:  Current Outpatient Medications   Medication Sig    aspirin Take 1 tablet (325 mg total) by mouth daily.    atorvastatin Take 1 tablet (80 mg total) by mouth daily.    busPIRone Take 1 tablet (10 mg total) by mouth in the morning and at bedtime.    calcium carbonate (CALCIUM 300 ORAL) Take 1 tablet by mouth  daily.    lipase/protease/amylase (CREON ORAL) Take 1 capsule by mouth 3 times a day.    lisinopriL Take 1 tablet (10 mg total) by mouth daily. 10 mg (half tablet) daily    MELATONIN ORAL Take 3-10 mg by mouth at bedtime. PRN for sleep    multivitamin Take 1 tablet by mouth daily.    naloxone Apply 1 spray in one nostril if needed. Call 911. May repeat dose in other nostril if no response in 3 minutes.    PARoxetine Take 1 tablet (20 mg total) by mouth daily.    roPINIRole Take 1 tablet (1 mg total) by mouth daily.    alectinib Take 600 mg by mouth 2 times a day for 30 days. Indications: ALK Positive Non-Small Cell Lung Cancer     No current facility-administered medications for this visit.       ALLERGIES:    Penicillins, Adhesive, Formaldehyde analogues, Nsaids (non-steroidal anti-inflammatory drug), Promethazine hcl, Latex, and Sulfa (sulfonamide antibiotics)    REVIEW OF SYSTEMS:  Review of Systems   Constitutional:  Negative for chills, diaphoresis, fever, malaise/fatigue and weight loss.   HENT:  Negative for congestion, hearing loss, sore throat and tinnitus.    Eyes:  Negative for blurred vision and double vision.   Respiratory:  Negative for cough, hemoptysis and shortness  of breath.    Cardiovascular:  Negative for chest pain, palpitations and leg swelling.   Gastrointestinal:  Negative for abdominal pain, constipation, diarrhea, nausea and vomiting.   Genitourinary:  Negative for dysuria, flank pain and urgency.   Musculoskeletal:  Negative for joint pain, myalgias and neck pain.   Skin:  Negative for itching and rash.   Neurological:  Negative for dizziness, weakness and headaches.   Psychiatric/Behavioral:  Negative for depression, substance abuse and suicidal ideas. The patient is not nervous/anxious.    All other systems reviewed and are negative.      PHYSICAL EXAMINATION:  Vitals:    08/14/22 1031   BP: 115/75   BP Location: Left upper arm   Pulse: 89   Resp: 16   Temp: 97.1 F (36.2 C)   TempSrc:  Temporal   SpO2: 95%   Weight: 173 lb 6.4 oz (78.7 kg)   Height: 5' 6.5 (1.689 m)    Body surface area is 1.92 meters squared.    Physical Exam  Constitutional:       General: She is not in acute distress.     Appearance: Normal appearance. She is normal weight. She is not ill-appearing.   HENT:      Head: Normocephalic and atraumatic.      Nose: Nose normal.      Mouth/Throat:      Mouth: Mucous membranes are moist.      Pharynx: Oropharynx is clear.   Eyes:      General: No scleral icterus.     Extraocular Movements: Extraocular movements intact.      Conjunctiva/sclera: Conjunctivae normal.   Cardiovascular:      Rate and Rhythm: Normal rate.      Pulses: Normal pulses.   Pulmonary:      Effort: Pulmonary effort is normal. No respiratory distress.   Abdominal:      General: Abdomen is flat. There is no distension.   Musculoskeletal:         General: No swelling or tenderness. Normal range of motion.      Cervical back: Normal range of motion.   Skin:     General: Skin is warm and dry.      Coloration: Skin is not jaundiced.      Findings: No rash.   Neurological:      General: No focal deficit present.      Mental Status: She is alert and oriented to person, place, and time. Mental status is at baseline.      Cranial Nerves: No cranial nerve deficit.      Gait: Gait normal.   Psychiatric:         Mood and Affect: Mood normal.         Behavior: Behavior normal.         Thought Content: Thought content normal.       LABORATORY DATA:  Lab Results   Component Value Date    WBC 9.9 08/14/2022    RBC 4.01 08/14/2022    HGB 12.6 08/14/2022    HCT 37.5 08/14/2022    MCV 93.5 08/14/2022    MCH 31.5 08/14/2022    MCHC 33.7 08/14/2022    RDW 13.9 08/14/2022    PLT 144 08/14/2022        Lab Results   Component Value Date    GLUCOSE 89 05/08/2022    BUN 32 (H) 07/08/2022    CO2 26 05/08/2022    CREATININE 1.16 07/08/2022  K 4.1 05/08/2022    NA 137 05/08/2022    CL 104 05/08/2022    CALCIUM 8.9 05/08/2022    ALBUMIN 3.8  05/08/2022    PROT 6.4 05/08/2022    ALKPHOS 52 05/08/2022    ALT 21 05/08/2022    AST 28 05/08/2022    BILITOT 0.5 05/08/2022     No results found for: TSH, T3TOTAL, T4TOTAL, T3FREE, FREET4, THYROIDAB     RADIOLOGY REVIEW:  Images personally reviewed and compared to priors  CT Chest With IV contrast    Result Date: 07/08/2022  IMPRESSION: 1.  No convincing evidence of intrathoracic metastatic disease. 2.  Decreased size of sub-6 mm pulmonary nodules, likely represents postinfectious/inflammatory. Continued attention on follow-up. Approved by Venia Minks, MD on 07/08/2022 2:30 PM EST I have personally reviewed the images and I agree with this report. 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 Others stable and likely not cancerous.  - Clinically well and back to normal. Labs improved and normalized.  - Discussed data from the recent ALINA trial of adjuvant alectinib showing excellent benefit and she is a good candidate for this treatment. Will plan for adjuvant alectinib treatment and we reviewed the typical response rates and expectations of treatment as well as the general R/B/AEs associated with treatment. She will notify us when she receives the medication from specialty pharmacy and we will schedule a follow up visit 2 weeks after starting to check labs and toxicity.  - Will continue planned surveillance with CT imaging. Due for next scan in May. Ordered today.   - Follow up 2 weeks after starting new drug.      Any elements of the HPI and Assessment/Plan copied from prior notes have been updated where appropriate and all reflect current medical decision making from today, 08/14/2022. Physical examination listed above was completed in entirety today, 08/14/2022.    Hildred Priest,  MD  Thoracic and Head & Neck Oncology  [facility]

## 2022-08-14 NOTE — Progress Notes (Signed)
Medication Reconciliation  [place] [facility]  Department of Pharmacy Services    Medication reconciliation has been completed by [facility]    Information obtained from: Patient  Medications:  Prior to Admission medications taking for visit date 08/14/22   Medication Sig Taking? Authorizing Provider   aspirin 325 MG tablet Take 1 tablet (325 mg total) by mouth daily.   Yes Hema Alessandra Grout, MD   atorvastatin (LIPITOR) 80 MG tablet Take 1 tablet (80 mg total) by mouth daily. Yes Historical Provider, MD   busPIRone (BUSPAR) 10 MG tablet Take 1 tablet (10 mg total) by mouth in the morning and at bedtime. Yes Historical Provider, MD   calcium carbonate (CALCIUM 300 ORAL) Take 1 tablet by mouth daily. Yes Historical Provider, MD   lipase/protease/amylase (CREON ORAL) Take 1 capsule by mouth 3 times a day. Yes Historical Provider, MD   lisinopriL (PRINIVIL) 10 MG tablet Take 1 tablet (10 mg total) by mouth daily. 10 mg (half tablet) daily Yes Historical Provider, MD   MELATONIN ORAL Take 3-10 mg by mouth at bedtime. PRN for sleep Yes Historical Provider, MD   multivitamin [facility]) tablet Take 1 tablet by mouth daily. Yes Historical Provider, MD   PARoxetine (PAXIL) 20 MG tablet Take 1 tablet (20 mg total) by mouth daily.   Yes Historical Provider, MD   roPINIRole (REQUIP) 1 MG tablet Take 1 tablet (1 mg total) by mouth daily.   Yes Historical Provider, MD   alectinib 150 mg Cap Take 600 mg by mouth 2 times a day for 30 days. Indications: ALK Positive Non-Small Cell Lung Cancer Not started yet Hildred Priest, MD   naloxone [facility]) 4 mg/actuation [place] Apply 1 spray in one nostril if needed. Call 911. May repeat dose in other nostril if no response in 3 minutes. Has, never had to use Art Buff, MD        To my knowledge the above medication reconciliation is accurate as of 08/14/2022. Please contact with questions and/or concerns.     Please note:   Patient may start alectinib  in the future (if approved and cost isn't too much). No drug drug interactions notes. Written information given to patient.     Thank you,  S. Ramond Dial, PharmD, BCOP  Oncology Clinical Pharmacy Specialist  [place] [facility]  Office Phone: [phone]  Ascom: [phone]

## 2022-08-14 NOTE — Progress Notes (Signed)
Pt arrived to infusion services for [facility] labs and MD visit. Port accessed using sterile technique with good blood return noted. Labs collected and sent. Port flushed, Heplocked and deaccessed with good blood return. Bandaid dressing in place. Pt left ambulatory at baseline.

## 2022-08-18 NOTE — Telephone Encounter (Signed)
[name]    Patient calling nurse triage line stating she received a call from her insurance company that the PA for Alectinib has been denied. Per patient, they require any pertinent information related to patient and this medication; she states they said the office would know what is needed. Advised I would send a message to the team.

## 2022-08-20 NOTE — Telephone Encounter (Signed)
[name] appeal faxed to [phone]. Fax confirmation received.

## 2022-08-27 MED ORDER — gabapentin (NEURONTIN) 300 MG capsule
300 | ORAL_CAPSULE | Freq: Three times a day (TID) | ORAL | 1 refills | Status: AC
Start: 2022-08-27 — End: ?

## 2022-08-27 NOTE — Telephone Encounter (Signed)
She said she didn't think she needed it anymore so she stopped taking it. She wanted to know if the neuropathy could be caused from the chemo treatment?     Aloha Gell RN,BSN  Oncology Nurse Triage

## 2022-08-27 NOTE — Telephone Encounter (Deleted)
Patient said we were the prescribers for the Gabapentin.

## 2022-08-27 NOTE — Telephone Encounter (Signed)
Yes, she would like a new script.     [facility]  [phone]     Aloha Gell RN,BSN  Oncology Nurse Triage

## 2022-08-27 NOTE — Telephone Encounter (Signed)
Called [place] to advise.No answer. Left message on VM that had identifiers.     Advised her that Gabapentin has been sent into [facility] and also advised her to start with 300mg  QHS x 5 days, then increase to BID x 5 days and then go up to TID there afterr    Left our call back number [phone] option [phone] if she had any further questions.

## 2022-08-27 NOTE — Telephone Encounter (Signed)
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 She said she went to her PCP and they advised her to call us. 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 She said it comes and goes and the hands only star up when the feet are numbness, tingling, and like bugs crawling on her and she is walking on sand. She said sometimes she get pins and needles feeling.   She said she does get restless leg syndrome. Denied having Gabapentin now but says she was using this for these types of symptoms.   She also wants to reports that she has burning sensation in scalp. She said she has been experiencing the burning for 2 days. She said this sensations feels like its spreading.   Denied redness to area, tender to touch, swelling.    Denied using different hair care products, sob, chest pain, leg swelling, hand swelling,fever.       Patient also says she is completed with therapy.   [facility]  [phone]    Aloha Gell RN,BSN  Oncology Nurse Triage

## 2022-08-28 NOTE — Progress Notes (Signed)
MEDICATION ACCESS SERVICES    A referral was received for this patients [name] medication due to an unaffordable copay. A voicemail was left for the patient to give me a call back regarding screening for assistance.    My call  back number was given.    I'll continue to follow.    Keeleigh Terris  Patient [employer], Operations Program Coordinator  Hematology/BMT/Oncology   T: [phone]

## 2022-09-07 NOTE — Progress Notes (Signed)
MEDICATION ACCESS SERVICES    I placed a follow up call to this patient regarding the [name] medication and upon speaking to the patient it was determined that her income was 73k for a household size of 1. The free drug application will be started to obtain signature and proof of income. Patient voiced understanding.    The provider portion will be sent for signature.    I'll continue to follow.    Ramzi Brathwaite  Patient [employer], Operations Program Coordinator  Hematology/BMT/Oncology   T: [phone]

## 2022-09-08 NOTE — Telephone Encounter (Signed)
Pre call for port removal 09/09/22 at 9 am: Patient verbalized knowledge to remain NPO after MN (except med's with a sip of water) and will bring driver/ins/ID to [facility] at 8 am. Allergies/medications reviewed/updated.     IR PRE-PROCEDURE PHONE CALL NOTE   Date: 09/08/2022  MRN: [id number]  Patient: Joanna Lopez  Age: 72 y.o.      Wt Readings from Last 1 Encounters:   08/14/22 173 lb 6.4 oz (78.7 kg)      Ht Readings from Last 1 Encounters:   08/14/22 5' 6.5 (1.689 m)       Allergies Verified? Yes  Allergies   Allergen Reactions    Penicillins Anaphylaxis     Facial Swelling  immediate rash, facial/tongue/throat swelling, SOB or lightheadedness with hypotension: Yes  severe rash involving mucus membranes or skin necrosis: Yes  Has patient had a PCN reaction that required hospitalization: No  Has patient had a PCN reaction occurring within the last 10 years: No  If all of the above answers are NO, then may proceed with Cephalosporin use.    Adhesive      Other reaction(s): breaks out and itches if on for long time    Formaldehyde Analogues     Nsaids (Non-Steroidal Anti-Inflammatory Drug)      Severe GI issues    Promethazine Hcl      Restless legs    Latex Rash     States she has a sensitivity to latex after being around it for days, she gets a rash: she states that when she wore her cpap machine for 14 days she developed a rash on her face other than this she has no problems stating her dentist wears latex gloves and she has no problems with latex gloves.     Sulfa (Sulfonamide Antibiotics) Rash       History:     Primary Care Physician: [name], MD    Past Medical History:   Diagnosis Date    Anxiety     Basal cell cancer     Cataract     Hyperlipidemia     Hypertension     Lung nodule     OSA (obstructive sleep apnea)     RLS (restless legs syndrome)     Stroke (CMS-HCC)     5 yrs ago, TIA X 2     Past Surgical History:   Procedure Laterality Date    APPENDECTOMY      CATARACT EXTRACTION, BILATERAL  Bilateral 07/2021    COLONOSCOPY N/A 01/23/2013    Procedure: COLONOSCOPY WITH MAC;  Surgeon: Lurene Shadow, MD;  Location: [facility] ENDOSCOPY;  Service: Gastroenterology;  Laterality: N/A;    FLEXIBLE BRONCHOSCOPY W/ UPPER ENDOSCOPY N/A 09/17/2016    Procedure: BRONCHOSCOPY SUPER D WITH FLUORO AND BIOPSY-EBUS/NAVI;  Surgeon: Johnny Bridge, MD;  Location: [facility] ENDOSCOPY;  Service: Pulmonary;  Laterality: N/A;    INSERTION CATHETER VASCULAR ACCESS N/A 12/26/2021    Procedure: INSERTION POWER PORT A CATH;  Surgeon: Penni Bombard, MD;  Location: [facility] OR;  Service: General;  Laterality: N/A;    LOOP IMPLANT IMPLANTATION N/A 04/07/2017    Procedure: Loop  Implantation;  Surgeon: Tennis Must, MD;  Location: [facility] ELECTROPHYSIOLOGY LAB;  Service: Electrophysiology;  Laterality: N/A;    LOOP REMOVAL Left 04/09/2021    Procedure: Loop Removal;  Surgeon: Lawerance Cruel, MD;  Location: [facility] MIS SUITE;  Service: Electrophysiology;  Laterality: Left;    submastoid gland removal      THORACOSCOPY Right  11/23/2016    Procedure: RIGHT SIDED VATS MIDDLE LOBE LOBECTOMY,HILAR MEDIASTINAL LYMPH NODE DISSECTION;  Surgeon: Janett Billow, MD;  Location: [facility] OR;  Service: Thoracic;  Laterality: Right;    THORACOSCOPY LOBECTOMY VIDEO ASSISTED Left 11/18/2021    Procedure: Left Thoracoscopic Lingulectomy;  Surgeon: Janett Billow, MD;  Location: [facility] OR;  Service: General;  Laterality: Left;    TONSILLECTOMY      UVULOPALATOPHARYNGOPLASTY         Medications:  Current Outpatient Medications on File Prior to Visit   Medication Sig Dispense Refill    aspirin 325 MG tablet Take 1 tablet (325 mg total) by mouth daily. 30 tablet 0    atorvastatin (LIPITOR) 80 MG tablet Take 1 tablet (80 mg total) by mouth daily.      busPIRone (BUSPAR) 10 MG tablet Take 1 tablet (10 mg total) by mouth in the morning and at bedtime.      calcium carbonate (CALCIUM 300 ORAL) Take 1 tablet by mouth daily.      gabapentin (NEURONTIN) 300 MG capsule Take 1 capsule (300  mg total) by mouth 3 times a day. (Patient taking differently: Take 1 capsule (300 mg total) by mouth daily.) 90 capsule 1    lipase/protease/amylase (CREON ORAL) Take 1 capsule by mouth 3 times a day.      lisinopriL (PRINIVIL) 10 MG tablet Take 1 tablet (10 mg total) by mouth daily. 10 mg (half tablet) daily      multivitamin (THERAGRAN) tablet Take 1 tablet by mouth daily.      PARoxetine (PAXIL) 20 MG tablet Take 1 tablet (20 mg total) by mouth daily.      roPINIRole (REQUIP) 1 MG tablet Take 1 tablet (1 mg total) by mouth daily.      alectinib 150 mg Cap Take 4 capsules (600 mg) by mouth 2 times a day. Indications: ALK Positive Non-Small Cell Lung Cancer (Patient taking differently: Take 600 mg by mouth 2 times a day. 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 PRN for sleep      naloxone (NARCAN) 4 mg/actuation Spry Apply 1 spray in one nostril if needed. Call 911. May repeat dose in other nostril if no response in 3 minutes. 2 each 1     No current facility-administered medications on file prior to visit.         [facility] Phinneas Shakoor  Interventional Radiology [phone]   09/08/2022, 1:46 PM

## 2022-09-09 ENCOUNTER — Inpatient Hospital Stay: Admit: 2022-09-09 | Payer: MEDICARE | Attending: Vascular & Interventional Radiology

## 2022-09-09 DIAGNOSIS — C3412 Malignant neoplasm of upper lobe, left bronchus or lung: Secondary | ICD-10-CM

## 2022-09-09 DIAGNOSIS — Z452 Encounter for adjustment and management of vascular access device: Secondary | ICD-10-CM

## 2022-09-09 MED ORDER — lidocaine-EPINEPHrine 1 %-1:100,000 injection
1 | INTRAMUSCULAR | Status: AC
Start: 2022-09-09 — End: ?

## 2022-09-09 MED ORDER — heparin 1000 units in 0.9% NaCl 500 mL IV infusion 1,000 unit/500 mL infusion
1000 | INTRAVENOUS | Status: AC
Start: 2022-09-09 — End: ?

## 2022-09-09 MED ORDER — midazolam (PF) (VERSED) injection
1 | INTRAMUSCULAR | Status: AC | PRN
Start: 2022-09-09 — End: 2022-09-09
  Administered 2022-09-09: 14:00:00 1 via INTRAVENOUS

## 2022-09-09 MED ORDER — lidocaine 10 mg/mL (1 %) injection
10 | INTRAMUSCULAR | Status: AC | PRN
Start: 2022-09-09 — End: 2022-09-09
  Administered 2022-09-09: 14:00:00 10

## 2022-09-09 MED ORDER — fentaNYL (SUBLIMAZE) injection
50 | INTRAMUSCULAR | Status: AC | PRN
Start: 2022-09-09 — End: 2022-09-09
  Administered 2022-09-09: 14:00:00 50 via INTRAVENOUS

## 2022-09-09 MED ORDER — sodium chloride 0.9 % IV infusion
INTRAVENOUS | Status: AC
Start: 2022-09-09 — End: 2022-09-10
  Administered 2022-09-09: 13:00:00 50 mL/h via INTRAVENOUS

## 2022-09-09 MED ORDER — sodium chloride 0.9% flush 10 mL
INTRAMUSCULAR
Start: 2022-09-09 — End: 2022-09-10

## 2022-09-09 MED ORDER — lidocaine 10 mg/mL (1 %) injection
10 | INTRAMUSCULAR | Status: AC
Start: 2022-09-09 — End: ?

## 2022-09-09 MED ORDER — fentaNYL (SUBLIMAZE) 50 mcg/mL injection
50 | INTRAMUSCULAR | Status: AC
Start: 2022-09-09 — End: ?

## 2022-09-09 MED ORDER — midazolam (PF) (VERSED) 1 mg/mL injection
1 | INTRAMUSCULAR | Status: AC
Start: 2022-09-09 — End: ?

## 2022-09-09 MED FILL — FENTANYL (PF) 50 MCG/ML INJECTION SOLUTION: 50 50 mcg/mL | INTRAMUSCULAR | Qty: 2

## 2022-09-09 MED FILL — LIDOCAINE HCL 10 MG/ML (1 %) INJECTION SOLUTION: 10 10 mg/mL (1 %) | INTRAMUSCULAR | Qty: 20

## 2022-09-09 MED FILL — SODIUM CHLORIDE 0.9 % INTRAVENOUS SOLUTION: 50.00 50.00 mL/hr | INTRAVENOUS | Qty: 1000

## 2022-09-09 MED FILL — XYLOCAINE WITH EPINEPHRINE 1 %-1:100,000 INJECTION SOLUTION: 1 1 %-1:100,000 | INTRAMUSCULAR | Qty: 20

## 2022-09-09 MED FILL — MIDAZOLAM (PF) 1 MG/ML INJECTION SOLUTION: 1 1 mg/mL | INTRAMUSCULAR | Qty: 2

## 2022-09-09 MED FILL — HEPARIN (PORCINE) (PF) 1,000 UNIT/500 ML IN 0.9 % SODIUM CHLORIDE IV: 1000 1,000 unit/500 mL | INTRAVENOUS | Qty: 500

## 2022-09-09 NOTE — H&P (Signed)
Interventional Radiology Pre-Procedure History and Physical       Date: 09/07/2022  Patient: Joanna Lopez  DOB: [birth year]  MRN: [id number]    Primary Care Provider: [name], MD  Requesting Provider: MD C. Lemmon   Reason for Consult: Venous access     HPI: Joanna Lopez is a 72 y.o. female presenting to Interventional Radiology for port placement. PMHx of left lung NSCLC s/p port placement on 12/26/2021 by surgery, patient now on surveillance.     History:  Past Medical History:   Diagnosis Date    Anxiety     Basal cell cancer     Cataract     Hyperlipidemia     Hypertension     Lung nodule     OSA (obstructive sleep apnea)     RLS (restless legs syndrome)     Stroke (CMS-HCC)     5 yrs ago, TIA X 2     Past Surgical History:   Procedure Laterality Date    APPENDECTOMY      CATARACT EXTRACTION, BILATERAL Bilateral 07/2021    COLONOSCOPY N/A 01/23/2013    Procedure: COLONOSCOPY WITH MAC;  Surgeon: Lurene Shadow, MD;  Location: [facility] ENDOSCOPY;  Service: Gastroenterology;  Laterality: N/A;    FLEXIBLE BRONCHOSCOPY W/ UPPER ENDOSCOPY N/A 09/17/2016    Procedure: BRONCHOSCOPY SUPER D WITH FLUORO AND BIOPSY-EBUS/NAVI;  Surgeon: Johnny Bridge, MD;  Location: [facility] ENDOSCOPY;  Service: Pulmonary;  Laterality: N/A;    INSERTION CATHETER VASCULAR ACCESS N/A 12/26/2021    Procedure: INSERTION POWER PORT A CATH;  Surgeon: Penni Bombard, MD;  Location: [facility] OR;  Service: General;  Laterality: N/A;    LOOP IMPLANT IMPLANTATION N/A 04/07/2017    Procedure: Loop  Implantation;  Surgeon: Tennis Must, MD;  Location: [facility] ELECTROPHYSIOLOGY LAB;  Service: Electrophysiology;  Laterality: N/A;    LOOP REMOVAL Left 04/09/2021    Procedure: Loop Removal;  Surgeon: Lawerance Cruel, MD;  Location: [facility] MIS SUITE;  Service: Electrophysiology;  Laterality: Left;    submastoid gland removal      THORACOSCOPY Right 11/23/2016    Procedure: RIGHT SIDED VATS MIDDLE LOBE LOBECTOMY,HILAR MEDIASTINAL LYMPH NODE DISSECTION;   Surgeon: Janett Billow, MD;  Location: [facility] OR;  Service: Thoracic;  Laterality: Right;    THORACOSCOPY LOBECTOMY VIDEO ASSISTED Left 11/18/2021    Procedure: Left Thoracoscopic Lingulectomy;  Surgeon: Janett Billow, MD;  Location: [facility] OR;  Service: General;  Laterality: Left;    TONSILLECTOMY      UVULOPALATOPHARYNGOPLASTY       Family History   Adopted: Yes   Problem Relation Age of Onset    Diabetes Son        Social Hx:  Social History     Tobacco Use   Smoking Status Never   Smokeless Tobacco Never     Social History     Substance and Sexual Activity   Drug Use No     Social History     Substance and Sexual Activity   Alcohol Use Yes    Alcohol/week: 2.0 standard drinks of alcohol    Types: 2 Glasses of wine per week    Comment: occasionally       Allergies:   Allergies   Allergen Reactions    Penicillins Anaphylaxis     Other reaction(s): Facial Swelling  Has patient had a PCN reaction causing immediate rash, facial/tongue/throat swelling, SOB or lightheadedness with hypotension: Yes  Has patient had a PCN reaction causing severe rash involving mucus membranes  or skin necrosis: Yes  Has patient had a PCN reaction that required hospitalization: No  Has patient had a PCN reaction occurring within the last 10 years: No  If all of the above answers are NO, then may proceed with Cephalosporin use.    Adhesive      Other reaction(s): breaks out and itches if on for long time    Formaldehyde Analogues     Nsaids (Non-Steroidal Anti-Inflammatory Drug)      Severe GI issues    Promethazine Hcl      Restless legs    Latex Rash     States she has a sensitivity to latex after being around it for days, she gets a rash: she states that when she wore her cpap machine for 14 days she developed a rash on her face other than this she has no problems stating her dentist wears latex gloves and she has no problems with latex gloves.     Sulfa (Sulfonamide Antibiotics) Rash       Home Medications:  Prior to Admission medications     Medication   alectinib 150 mg Cap   aspirin 325 MG tablet   atorvastatin (LIPITOR) 80 MG tablet   busPIRone (BUSPAR) 10 MG tablet   calcium carbonate (CALCIUM 300 ORAL)   gabapentin (NEURONTIN) 300 MG capsule   lipase/protease/amylase (CREON ORAL)   lisinopriL (PRINIVIL) 10 MG tablet   MELATONIN ORAL   multivitamin (THERAGRAN) tablet   naloxone (NARCAN) 4 mg/actuation Spry   PARoxetine (PAXIL) 20 MG tablet   roPINIRole (REQUIP) 1 MG tablet       ROS:   Negative General: Denies fever, chills or weakness  Cardiovascular: Denies chest pain, chest pressure   Respiratory: Denies shortness of breath, wheezing   GI: Denies abdominal pain or n/v  GU: Denies pain with urination or blood in urine  Skin: Denies rashes or bruising  Neurological: Denies headaches, dizziness, gait problems  Extremities: Denies swelling or numbness/tingling  Psychiatric: Denies hallucinations and memory loss    Vitals:   Vitals:    09/09/22 0822 09/09/22 0830   BP: 125/71 113/74   Pulse: 64 71   Resp: 13 22   Temp: 98 F (36.7 C)    TempSrc: Oral    SpO2: 98% 99%   Weight: 170 lb (77.1 kg)    Height: 5' 6.5 (1.689 m)        PE:  Gen: Awake, alert, no apparent distress, as stated age  HEENT: Normocephalic, atraumatic, EOMI, mucous membranes pink and moist  Neck: Supple, symmetrical, trachea midline and mobile  Chest:Respirations unlabored, even   CVS: RRR  ABD: Soft, round  GU: deferred  EXT: Warm and well perfused  NEURO: No focal deficits, cranial nerves II-XII grossly intact  PSYCH: Appropriate affect     Labs:  Recent Labs     03/20/22  1150 05/08/22  1230 08/14/22  1105   WBC 6.6 4.3 9.9   HGB 10.3* 11.5* 12.6   HCT 29.5* 33.7* 37.5   MCV 93.8 98.3 93.5   PLT 108* 109* 144     No results for input(s): INR, PROTIME in the last 4320 hours.  Recent Labs     05/08/22  1230 07/08/22  1233 08/14/22  1105   CREATININE 1.23 1.16 1.12     Recent Labs     03/20/22  1150 05/08/22  1230 08/14/22  1105   ALT 17 21 21    AST 16 28 21  ALKPHOS 50 52  60   BILITOT 0.5 0.5 0.5       Imaging:  No results found for this or any previous visit from the past 72 hours.  , No results found for this or any previous visit from the past 336 hours.  , No results found for this or any previous visit from the past 720 hours.  , No results found., No results found., and No results found.    I personally reviewed the relevant findings from the above imaging results.    Pre-Sedation Evaluation:  Mallampati:  III  Mouth Opening:  > 4 cm  Facial Hair: No  Short Neck: No    ASA Score: II - Mild systemic disease with no functional limitations  Sedation-Specific History Concerns: None    This patient was re-evaluated immediately prior to sedation administration.          Medical Decision Making       Assessment:  Joanna Lopez is a 72 y.o. female presenting to IR for a port removal.     Plan:  1. Will proceed with IR image-guided port removal   2.Local/Subcutaneous Lidocaine 1% and Moderate Sedation/Intravenous Fentanyl & Versed  3. Current Anti-coagulation hold:  On Aspirin 235 mg PO daily; last dose this morning       The procedure has been explained. Post-procedure care instructions & education have been provided. All questions were answered.     Consent with alternatives to the procedure, risks, and benefits have been explained and discussed with the patient/patient's family and signed informed consent obtained. Patient/patient's family agreeable with plan of care.     Consent obtained and in IR      Tawni Millers, CNP  Vascular & Interventional Radiology  09/07/2022,1:35 PM  [facility]: [phone])

## 2022-09-09 NOTE — Brief Op Note (Signed)
Vascular & Interventional Radiology Brief Op Note       Date: 09/09/2022  Patient: Joanna Lopez  DOB: Jan 20, 1951  MRN: [id number]    IR Procedure(s) Performed:   Port Removal    Operators:  Gentry Roch, MD,       Pre-operative diagnosis:   Port no longer needed    Post-operative diagnosis:  Same    Intra-procedural Medications:    Medications   Medication Event Details Admin User Admin Time   fentaNYL (SUBLIMAZE) injection Medication Given Dose: 50 mcg; Route: Intravenous Lorrene Reid, RN 09/09/2022  9:47 AM   midazolam (PF) (VERSED) injection Medication Given Dose: 1 mg; Route: Intravenous Lorrene Reid, RN 09/09/2022  9:47 AM       Findings:  Port was looped in the right upper chest     Specimens removed:  Port    Estimated Blood Loss:  Minimal (less than 15mL)    Complications:  None    Access site(s):  Right chest    Post procedure care/monitoring:    Post sedation recovery    Recommendations/Follow-up:  No follow up needed    Please refer to full dictated Interventional Radiology report for details of findings/procedures, which can be located in EPIC Procedures (or EPIC Imaging) Tabs.    Please call with any questions.      Gentry Roch, MD  Vascular & Interventional Radiology  09/09/2022,1:54 PM  [facility]: [phone])

## 2022-09-09 NOTE — Discharge Instructions (Addendum)
Care after a Port Removal      We value your decision to have [facility] as part of your care team. Our team is focused on providing you with the highest quality of care. Thank you for giving [place] the opportunity to care for you or your family member at this time.     If you have any questions or concerns that are related to your procedure or your care with Vascular & Interventional Radiology, please call [phone]).    If you have any questions of concerns regarding your care with other departments please call the main hospital line [phone].        Post Procedure Instructions:     Do Not drive, exercise or do any strenuous activities today.    Do Not make any legal decisions today.    The area may be sore for 5-7 days.  You may take Tylenol or Ibuprofen for tenderness or pain at port site. Apply cold compresses as needed.     Do not remove/scratch the dermabond (glue) on the incision. It will fall off in about 7-10 days.     Remove dressing in 2 days.       Please call if:    You have bleeding, swelling or drainage at the port removal site.  You have a fever greater than 100.5 or chills.    PATIENT INSTRUCTIONS  POST-ANESTHESIA     IMMEDIATELY FOLLOWING SURGERY:  Do not drive or operate machinery for the first 24 hours after surgery.  Do not make any important decisions for 24 hours after surgery or while taking narcotic pain medications or sedatives.  Avoid intake of alcohol for 24 hours  If you develop intractable nausea and vomiting or a severe headache please notify your doctor immediately.  You may feel light-headed, dizzy, or sleepy following surgery.  DO NOT STAY ALONE.  A responsible person should stay with you overnight.     QUESTIONS?:  Please feel free to call your surgeon, primary care physician, or the hospital operator [phone]) if you have any questions.

## 2022-09-10 NOTE — Progress Notes (Signed)
MEDICATION ACCESS SERVICES  I have received the signed [name] provider portion as of 09/10/22.    The signed patient portion along with income is still needed and I spoke to the patient to confirm she will be emailing it back soon.    I'll continue to follow.    Fallynn Gravett  Patient [employer], Operations Program Coordinator  Hematology/BMT/Oncology   T: [phone]

## 2022-09-10 NOTE — Telephone Encounter (Signed)
Follow up call to Joanna Lopez Re: Port Removal performed 09/09/22.  Patient denies significant pain, fever/chills, bleeding, or questions/concerns at this time.

## 2022-09-15 NOTE — Progress Notes (Signed)
MEDICATION ACCESS SERVICES    I placed a call to this patient regarding the [name] medication and patient stated that she still has the application is wanting to get the income information at this time. Patient advised she will be sending it back as soon as possible.    I'll continue to follow.    Necie Wilcoxson  Patient [employer], Operations Program Coordinator  Hematology/BMT/Oncology   T: [phone]

## 2022-09-21 NOTE — Progress Notes (Signed)
MEDICATION ACCESS SERVICES    I have received the [name] patient portion signed along with income information as of 09/21/22.     The completed application will be faxed to the [facility] program for review today.    I'll continue to follow.    Dortha Neighbors  Patient [employer], Operations Program Coordinator  Hematology/BMT/Oncology   T: [phone]

## 2022-09-23 ENCOUNTER — Inpatient Hospital Stay: Admit: 2022-09-23 | Discharge: 2022-10-20 | Payer: MEDICARE

## 2022-09-23 DIAGNOSIS — C3412 Malignant neoplasm of upper lobe, left bronchus or lung: Secondary | ICD-10-CM

## 2022-09-23 MED ORDER — OMNIPAQUE (iohexol) 350 mg iodine/mL 100 mL
350 | Freq: Once | INTRAVENOUS | Status: AC | PRN
Start: 2022-09-23 — End: 2022-09-23
  Administered 2022-09-23: 15:00:00 100 mL via INTRAVENOUS

## 2022-09-23 NOTE — Progress Notes (Signed)
MEDICATION ACCESS SERVICES    I received a denial for this patients [name] medication due to not meeting the income guidelines. The patient advised she received a call yesterday from [place] advising that she would not qualify due to this years taxes. Patient did state she was at 73k this year for a household sie of 1. The income limit is 75k for 1 in the household, so I will be placing a follow up call to [facility] confirming the 7.5% out of pocket max guideline as this may be the reason she did not qualify.    Patient did state she is willing to pay for the medication if the [facility] is not willing to reconsider.     I will follow up to confirm.    Serai Tukes  [facility] [employer], Operations Program Coordinator  Hematology/BMT/Oncology   T: [phone]

## 2022-09-23 NOTE — Progress Notes (Signed)
MEDICATION ACCESS SERVICES    I placed a follow up to [facility] regarding the [name] and confirming that the out of pocket was 6% and has to be 7.5% for the patient to be considered. I spoke to the patient who confirmed she will go ahead and pay for the medication since she will not qualify for free drug at this time. No appeal is able to be submitted on patient's behalf unfortunately.     This information was routed back to [facility] to further assist patient.    No further assistance is needed from the Medication Assistance office.    Carmeline Kowal  Patient [employer], Operations Program Coordinator  Hematology/BMT/Oncology   T: [phone]

## 2022-09-25 ENCOUNTER — Ambulatory Visit: Admit: 2022-09-25 | Discharge: 2022-09-25 | Payer: MEDICARE

## 2022-09-25 ENCOUNTER — Encounter

## 2022-09-25 DIAGNOSIS — C3412 Malignant neoplasm of upper lobe, left bronchus or lung: Secondary | ICD-10-CM

## 2022-09-25 NOTE — Progress Notes (Unsigned)
THORACIC ONCOLOGY FOLLOW UP    DATE OF SERVICE: 09/25/2022  CHIEF COMPLAINT:   No chief complaint on file.    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, ALK+  DATE OF DIAGNOSIS: 11/18/21  STAGE:  Cancer Staging   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)  Staging form: Lung, AJCC 8th Edition  - Clinical: No stage assigned - Unsigned  - Pathologic stage from 12/11/2016: Stage IA2 (pT1b, pN0, cM0) - Signed by Janett Billow, MD on 12/11/2016    Malignant neoplasm of upper lobe of left lung (CMS-HCC)  Staging form: Lung, AJCC 8th Edition  - Pathologic stage from 12/04/2021: Stage IIB (pT1a, pN1, cM0) - Signed by Janett Billow, MD on 12/04/2021      GENOMICS:  EGFR mt negative  PD-L1 95%  ALK-EML4 fusion positive per ALCHEMIST screening trial testing    SPECIAL MEDICAL ISSUES: 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 11/18/21: Left VATS lingulectomy  2. 01/02/22 - 03/09/22: Adjuvant cisplatin, pemetrexed (C2+ dose reduced due to intolerance - n/v/fatigue)    ECOG PERFORMANCE STATUS: (1) Restricted in physically strenuous activity but ambulatory and able to carry out work of a light or sedentary nature, e.g., light house work, office work      INTERVAL HISTORY:  Joanna Lopez is here for follow up of her lung cancer. She has not yet received alectinib and is working on getting this from the pharmacy with financial information.  ***      She is doing great and feels well. No breathing issues. She is playing pickleball. She recently traveled and met her biological mother and siblings. No other complaints or concerns.    MEDICATIONS:  Current Outpatient Medications   Medication Sig    alectinib Take 4 capsules (600 mg) by mouth 2 times a day. Indications: ALK Positive Non-Small Cell Lung Cancer    aspirin Take 1 tablet (325 mg total) by mouth daily.    atorvastatin Take 1 tablet (80 mg total) by mouth  daily.    busPIRone Take 1 tablet (10 mg total) by mouth in the morning and at bedtime.    calcium carbonate (CALCIUM 300 ORAL) Take 1 tablet by mouth daily.    gabapentin Take 1 capsule (300 mg total) by mouth 3 times a day. (Patient taking differently: Take 1 capsule (300 mg total) by mouth daily.)    lipase/protease/amylase (CREON ORAL) Take 1 capsule by mouth 3 times a day.    lisinopriL Take 1 tablet (10 mg total) by mouth daily. 10 mg (half tablet) daily    MELATONIN ORAL Take 3-10 mg by mouth at bedtime. PRN for sleep    multivitamin Take 1 tablet by mouth daily.    naloxone Apply 1 spray in one nostril if needed. Call 911. May repeat dose in other nostril if no response in 3 minutes.    PARoxetine Take 1 tablet (20 mg total) by mouth daily.    roPINIRole Take 1 tablet (1 mg total) by mouth daily.     No current facility-administered medications for this visit.       ALLERGIES:    Penicillins, Adhesive, Formaldehyde analogues, Nsaids (non-steroidal anti-inflammatory drug), Promethazine hcl, Latex, and Sulfa (sulfonamide antibiotics)    REVIEW OF SYSTEMS:  Review of Systems   Constitutional:  Negative for chills, diaphoresis, fever, malaise/fatigue and weight loss.   HENT:  Negative  for congestion, hearing loss, sore throat and tinnitus.    Eyes:  Negative for blurred vision and double vision.   Respiratory:  Negative for cough, hemoptysis and shortness of breath.    Cardiovascular:  Negative for chest pain, palpitations and leg swelling.   Gastrointestinal:  Negative for abdominal pain, constipation, diarrhea, nausea and vomiting.   Genitourinary:  Negative for dysuria, flank pain and urgency.   Musculoskeletal:  Negative for joint pain, myalgias and neck pain.   Skin:  Negative for itching and rash.   Neurological:  Negative for dizziness, weakness and headaches.   Psychiatric/Behavioral:  Negative for depression, substance abuse and suicidal ideas. The patient is not nervous/anxious.    All other systems  reviewed and are negative.      PHYSICAL EXAMINATION:  There were no vitals filed for this visit.   There is no height or weight on file to calculate BSA.    Physical Exam  Constitutional:       General: She is not in acute distress.     Appearance: Normal appearance. She is normal weight. She is not ill-appearing.   HENT:      Head: Normocephalic and atraumatic.      Nose: Nose normal.      Mouth/Throat:      Mouth: Mucous membranes are moist.      Pharynx: Oropharynx is clear.   Eyes:      General: No scleral icterus.     Extraocular Movements: Extraocular movements intact.      Conjunctiva/sclera: Conjunctivae normal.   Cardiovascular:      Rate and Rhythm: Normal rate.      Pulses: Normal pulses.   Pulmonary:      Effort: Pulmonary effort is normal. No respiratory distress.   Abdominal:      General: Abdomen is flat. There is no distension.   Musculoskeletal:         General: No swelling or tenderness. Normal range of motion.      Cervical back: Normal range of motion.   Skin:     General: Skin is warm and dry.      Coloration: Skin is not jaundiced.      Findings: No rash.   Neurological:      General: No focal deficit present.      Mental Status: She is alert and oriented to person, place, and time. Mental status is at baseline.      Cranial Nerves: No cranial nerve deficit.      Gait: Gait normal.   Psychiatric:         Mood and Affect: Mood normal.         Behavior: Behavior normal.         Thought Content: Thought content normal.       LABORATORY DATA:  Lab Results   Component Value Date    WBC 9.9 08/14/2022    RBC 4.01 08/14/2022    HGB 12.6 08/14/2022    HCT 37.5 08/14/2022    MCV 93.5 08/14/2022    MCH 31.5 08/14/2022    MCHC 33.7 08/14/2022    RDW 13.9 08/14/2022    PLT 144 08/14/2022        Lab Results   Component Value Date    GLUCOSE 80 08/14/2022    BUN 28 (H) 08/14/2022    CO2 25 08/14/2022    CREATININE 1.12 08/14/2022    K 4.4 08/14/2022    NA 139 08/14/2022    CL  107 08/14/2022    CALCIUM 9.5  08/14/2022    ALBUMIN 4.0 08/14/2022    PROT 6.7 08/14/2022    ALKPHOS 60 08/14/2022    ALT 21 08/14/2022    AST 21 08/14/2022    BILITOT 0.5 08/14/2022     No results found for: TSH, T3TOTAL, T4TOTAL, T3FREE, FREET4, THYROIDAB     RADIOLOGY REVIEW:  Images personally reviewed and compared to priors  CT Chest With IV contrast    Result Date: 09/23/2022  IMPRESSION: No evidence of intrathoracic metastatic disease. Report Verified by: Leretha Pol, MD at 09/23/2022 11:08 AM EDT    IR rem CVAD with port or pump    Result Date: 09/09/2022  IMPRESSION: Successful removal of a rightleft chest port.  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   - Clinically well. Awaiting start of adjuvant alectinib given the recent FDA approval based off of the ALINA study. She will notify us when she receives the medication from specialty pharmacy and we will schedule a follow up visit 2 weeks after starting to check labs and toxicity.  - Will continue planned surveillance with CT imaging in 3 months.    - Follow up 2 weeks after starting new drug and will also schedule CT scan and follow up for 3 months.    Any elements of the HPI and Assessment/Plan copied from prior notes have been updated where appropriate and all reflect current medical decision making from today, 09/25/2022. Physical examination listed above was completed in entirety today, 09/25/2022.    Hildred Priest, MD  Thoracic and Head & Neck Oncology  [facility]

## 2022-10-01 NOTE — Telephone Encounter (Signed)
[facility] Note    Initial Pharmacist Assessment     Joanna Lopez is a 72 y.o. patient who  has a past medical history of Anxiety, Basal cell cancer, Cataract, Hyperlipidemia, Hypertension, Lung nodule, OSA (obstructive sleep apnea), RLS (restless legs syndrome), and Stroke (CMS-HCC)..    Prescription received for: [name]    Chart reviewed for diagnosis, medication appropriateness (including dose and duration), and potential interactions    Contacted patient who is new to [facility] and [facility].     Reviewed the role of specialty pharmacy.  Welcome packet will be provided with first order which includes bill of rights, privacy notice, pharmacy contact information (including 24- hour on call pharmacist), hours of operations, complaint process, refill procedure, information about patient assistance programs and patient management program (including opt-out option).     First order will include  Chemotherapy Safety: Oral handout.     Discussed indication, dose, duration of therapy, storage, administration, medication handling, potential side effects, and food/drug interactions.  Patient verbalized understanding.     Patient's prescription and non-prescription medications and natural supplements were reviewed and updated.     Medication changes: denies    Current Outpatient Medications   Medication Sig Dispense Refill    alectinib 150 mg Cap Take 4 capsules (600 mg) by mouth 2 times a day. Indications: ALK Positive Non-Small Cell Lung Cancer 240 capsule 11    aspirin 325 MG tablet Take 1 tablet (325 mg total) by mouth daily. 30 tablet 0    atorvastatin (LIPITOR) 80 MG tablet Take 1 tablet (80 mg total) by mouth daily.      busPIRone (BUSPAR) 10 MG tablet Take 1 tablet (10 mg total) by mouth in the morning and at bedtime.      calcium carbonate (CALCIUM 300 ORAL) Take 1 tablet by mouth daily.      gabapentin (NEURONTIN) 300 MG capsule Take 1 capsule (300 mg total) by  mouth 3 times a day. (Patient taking differently: Take 1 capsule (300 mg total) by mouth daily.) 90 capsule 1    lipase/protease/amylase (CREON ORAL) Take 1 capsule by mouth 3 times a day.      lisinopriL (PRINIVIL) 10 MG tablet Take 1 tablet (10 mg total) by mouth daily. 10 mg (half tablet) daily      MELATONIN ORAL Take 3-10 mg by mouth at bedtime. PRN for sleep      multivitamin (THERAGRAN) tablet Take 1 tablet by mouth daily.      naloxone (NARCAN) 4 mg/actuation Spry Apply 1 spray in one nostril if needed. Call 911. May repeat dose in other nostril if no response in 3 minutes. 2 each 1    PARoxetine (PAXIL) 20 MG tablet Take 1 tablet (20 mg total) by mouth daily.      roPINIRole (REQUIP) 1 MG tablet Take 1 tablet (1 mg total) by mouth daily.       No current facility-administered medications for this visit.       Allergies:   Allergies   Allergen Reactions    Penicillins Anaphylaxis     Facial Swelling  immediate rash, facial/tongue/throat swelling, SOB or lightheadedness with hypotension: Yes  severe rash involving mucus membranes or skin necrosis: Yes  Has patient had a PCN reaction that required hospitalization: No  Has patient had a PCN reaction occurring within the last 10 years: No  If all of the above answers are NO, then may proceed with Cephalosporin use.  Adhesive      Other reaction(s): breaks out and itches if on for long time    Formaldehyde Analogues     Nsaids (Non-Steroidal Anti-Inflammatory Drug)      Severe GI issues    Promethazine Hcl      Restless legs    Latex Rash     States she has a sensitivity to latex after being around it for days, she gets a rash: she states that when she wore her cpap machine for 14 days she developed a rash on her face other than this she has no problems stating her dentist wears latex gloves and she has no problems with latex gloves.     Sulfa (Sulfonamide Antibiotics) Rash       Plan:    - Prescription will be filled by [facility].     Method of delivery:  shipped via UPS by end of day 5/24. Patient should receive 5/25.    - Recommended monitoring: LFTs, CPK levels, monitor heart rate and blood pressure regularly, monitor for signs/symptoms of interstitial lung disease/pneumonitis and myalgia      - Specialty pharmacy staff will contact patient prior to next refill due date for patient consent to bill/ship.        Treatment based on NCCN guidelines.

## 2022-10-06 NOTE — Telephone Encounter (Signed)
5/28 Called and spoke with pt, she was agreeable to seeing Sydney Hoffman on a FCleon Dewshe would not be able to see her on 10/16/2022 due to being out of town. Sherron Ales is on vacation the week following the 7th, so I checked with the care team to find out when would be best to see the pt. I called pt back after speaking with care team and scheduled her for 10/21/2022 with [facility]

## 2022-10-20 ENCOUNTER — Ambulatory Visit: Admit: 2022-10-20 | Discharge: 2022-10-24 | Payer: MEDICARE | Attending: Pulmonary Disease

## 2022-10-20 DIAGNOSIS — C3490 Malignant neoplasm of unspecified part of unspecified bronchus or lung: Secondary | ICD-10-CM

## 2022-10-20 NOTE — Progress Notes (Signed)
History of Present Illness:      Joanna Lopez is a 72 y.o. female.    HPI    I saw Joanna Lopez last time in October 2015  She had another CT chest done - was seen by her PCP who called me with results  She feels the same  No major changes since last time I saw he  Patient denies hemoptysis, increased shortness of breath, chest pains, headaches, nausea, vomiting, diarrhea  No fevers or chills. 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 The patient provided verbal consent for an over the phone visit.    I spent 15 to 19 minutes  on this call conducting an interview, performing a limited exam by phone, and educating the patient on my assessment and plan.  Doing well  No issues since last I saw her  No arhythmias on the loop recorder    03/19/2020  Doing well  Happy  Had ct chest today    10/03/2020  Doing well she will go to [place] and [place] on a cruise in July  Enjoys life  Patient denies hemoptysis, increased shortness of breath, chest pains, headaches, nausea, vomiting, diarrhea  No fevers or chills. No new skin rashes    03/25/21  Doing well  Had a wonderful trip to [place] and [place]  Had ct chest    09/23/2021  Had ct chest  The lung nodule is slowly growing  She is feeling great    04/16/2022    On 11/18/21 she had VATS lingulectomy  01/02/22 - 03/09/22: Adjuvant cisplatin, pemetrexed (C2+ dose reduced due to intolerance - n/v/fatigue)  Feels tired   Accompanied by her good  friend  Thinking of spending the holidays in [place], but does not feel strong enough  To have repeat ct chest    10/20/2022  On alectinib, doing well  Being monitored by Dr Zerita Boers  Last CT chest stable  She feels great  In a retirement community, very active and enjoys life  Not short of breath    Review of Systems   Constitutional:  Positive for fatigue.   HENT:  Positive for postnasal drip.    Respiratory:  Positive for cough, shortness of breath and wheezing.    Gastrointestinal:  Positive for bloating and diarrhea.   Musculoskeletal:  Positive for arthralgias.   Neurological:  Positive for tremors.   Hematological:  Bruises/bleeds easily.   Psychiatric/Behavioral:  The patient is nervous/anxious.    All other systems reviewed and are negative.      Objective:   Blood pressure 108/70, pulse 70, height 5' 6.5 (1.689 m), weight 188 lb 12.8 oz (85.6 kg), SpO2 95%.    Physical Exam    Nursing note and vitals reviewed.   Constitutional: Oriented to person, place, and  time. Appears well-developed and well-nourished. No distress.   HENT: pupils equal, no scleral icterus, on conjunctivitis, no enlarged thyroid  Head: Normocephalic.    Ears: External ear normal.   Mouth/Throat: Oropharynx is clear and moist. No oropharyngeal exudate.   Eyes: Conjunctivae are normal. Pupils are equal, round, and reactive to light. Right eye exhibits no discharge. Left eye exhibits no discharge.   Neck: Normal range of motion. Neck supple. No JVD present. No tracheal deviation present. No thyromegaly present.   Cardiovascular: Normal rate, regular rhythm, normal heart sounds and intact distal pulses. Exam reveals no gallop and no friction rub.   No murmur heard.   Pulmonary/Chest: Effort normal and breath sounds normal. No stridor. No respiratory distress. No wheezes. No rales. Exhibits no tenderness.   Abdominal:Exhibits no distension and no mass. There is no tenderness. There is no rebound and no guarding.   Musculoskeletal: exhibits no  edema and no tenderness.   Neurological: alert and oriented to person, place, and time. normal reflexes. No cranial nerve deficit. Coordination normal.   Skin: Warm and dry. Not diaphoretic. No erythema.   Psychiatric: Normal mood and affect. Behavior is normal. Judgment and thought content normal.       Exam: CT CHEST WO CONTRAST dated 08/14/2016 10:17 AM EDT     CLINICAL HISTORY: lung nodules;      COMPARISON: February 07, 2014     TECHNIQUE: Multidetector  CT imaging was obtained through the chest in the supine position without intravenous contrast. The images were reconstructed with 5 and 1 mm collimation using a 38 cm field of view.     FINDINGS: The lung windows show that the central airways are patent. Small calcified nodules reflect healed granulomatous infection.     The well-defined noncalcified nodule in the medial segment right middle lobe measures approximately 2.1 x 1.8 cm on series 5 image 214, previously 1.7 x 1.5 cm on February 07, 2014. The central portion of this nodule is fluid attenuation.     The irregular ill-defined subsolid nodule in the superior lingular subsegment of the left upper lobe on series 5 image 193 measures approximately 0.9 x 0.4 cm.     A tiny subpleural nodule at the left apex laterally on series 5 image 73 is unchanged. There are no new lesions.     The mediastinal windows show granulomatous calcification but otherwise no lymphadenopathy.     No coronary artery calcifications are visible. The caliber of the great vessels is normal.     Limited noncontrast imaging through the upper abdomen shows multiple low-attenuation liver lesions, similar to prior studies, likely cysts. A fluid attenuation lesion centrally in the left kidney is larger compared to 2014 but likely a peripelvic cyst.     The bone windows show no suspicious lesions.        IMPRESSION:     While predominantly fluid attenuation, the right middle lobe nodule is mildly increased in size compared to 3 years  previously. This is most likely a benign lesion such as granuloma or hamartoma. The fluid attenuation makes carcinoid tumor less likely. Nevertheless, due to the interval growth a follow-up CT in 12 months should be considered.     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 This can be followed at the same time.     Report Verified by: Rick Duff, M.D. at 08/14/2016 12:53 PM EDT  EXAM: CT CHEST WO CONTRAST      INDICATION: Abnormal xray - lung nodule, < 1 cm, mod-high risk,      COMPARISON: CT of the chest from 10/24/2018.     TECHNIQUE: Multidetector CT imaging was obtained through the chest in the supine position without intravenous contrast. Additional axial MIP images were reconstructed.       FINDINGS:   Medical devices: Loop recorder in the anterior left chest wall, unchanged.     Airways & lungs: The central airways are patent. Stable postsurgical changes related to right middle lobectomy. Previously demonstrated pulmonary nodules are stable including the subsolid nodule in the lateral left upper lobe. No new or enlarging pulmonary nodules. Upper lobe predominant emphysema, unchanged.     Pleura: No pleural effusions or pneumothorax.     Lower Neck: Unremarkable.     Heart: The heart is normal in size.     Vascular structures: Aorta is normal in caliber. Main pulmonary artery is normal in caliber.     Mediastinum and hila: No pathologically enlarged lymph nodes.     Chest wall and axilla: Unremarkable.     Upper abdomen: Low-attenuation lesions in the liver, unchanged and most likely cysts.     Musculoskeletal: No suspicious osteolytic or osteoblastic lesions.        IMPRESSION:     1.  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 Recommend follow-up low-dose chest CT in 12 months.     Report Verified by: Essie Hart, MD at 04/13/2019 3:03 PM EST    EXAM: CT CHEST WO CONTRAST     INDICATION: Lung nodule, > 8mm     COMPARISON: CT chest dated April 13, 2019, October 24, 2018, May 31, 2018, CT chest dated February 07, 2014.     TECHNIQUE: Multidetector CT imaging was obtained through the chest in the supine position without intravenous contrast. Additional axial MIP images were reconstructed.       FINDINGS:   Medical devices: Left apical there is seen in the subcutaneous tissue of the left anterior chest wall.     Airways & lungs: Stable postsurgical changes of right middle lobectomy. Central airways are patent. Mild apical predominant emphysema. Few scattered calcified granulomas. Seen is spiculated nodule in the left upper lobe with tiny central area of lucency measuring approximately 9 x 6 mm (image #184, series 4), equivocally increase in size compared to study dated May 10, 2019 and definitely increased in size compared to study dated February 07, 2014. Additional subsolid nodule appears unchanged. No new pulmonary nodule. Bibasilar atelectasis. Lungs are otherwise clear.     Pleura: No pleural effusions or pneumothorax.     Lower Neck: Unremarkable.     Heart: The heart is normal in size.     Vascular structures: Mild calcification of aorta and branch vessels. The vascular structures otherwise unremarkable. Main pulmonary artery is normal in caliber.     Mediastinum and hila: No pathologically enlarged lymph nodes. Calcified right hilar and mediastinal lymph nodes.     Chest wall and axilla: Unremarkable.     Upper abdomen: Again seen are multiple hepatic cyst.     Musculoskeletal: No suspicious osteolytic or osteoblastic lesions.     Impression   IMPRESSION:     1.  Subtle increase in size of subsolid nodule in the left upper lobe measuring 9 x 6 mm. Follow-up is suggested in 6 months to  ensure stability.   2.  Mild emphysema.       Report Verified by:Lana Fishchal, MD at 03/19/2020 2:15 PM EST          Assessment:     Lung nodules, carcinoid, s/p resection VATS RML  Lung nodule LUL, adenocarcinoma, s/p VATS lingulectomy stage II B     Plan:   Doing well on Alectinib  No tired, not short of breath  CT chest being monitored by Dr Carron Curie from a pulmonary perspective, will see her on as needed basis

## 2022-10-21 ENCOUNTER — Ambulatory Visit: Admit: 2022-10-21 | Discharge: 2022-10-26 | Payer: MEDICARE

## 2022-10-21 DIAGNOSIS — C3412 Malignant neoplasm of upper lobe, left bronchus or lung: Secondary | ICD-10-CM

## 2022-10-21 LAB — COMPREHENSIVE METABOLIC PANEL
ALT: 43 U/L (ref 7–52)
AST: 43 U/L — ABNORMAL HIGH (ref 13–39)
Albumin: 4.1 g/dL (ref 3.5–5.7)
Alkaline Phosphatase: 76 U/L (ref 36–125)
Anion Gap: 5 mmol/L (ref 3–16)
BUN: 32 mg/dL — ABNORMAL HIGH (ref 7–25)
CO2: 27 mmol/L (ref 21–33)
Calcium: 9 mg/dL (ref 8.6–10.3)
Chloride: 109 mmol/L (ref 98–110)
Creatinine: 1.63 mg/dL — ABNORMAL HIGH (ref 0.60–1.30)
EGFR: 33
Glucose: 85 mg/dL (ref 70–100)
Osmolality, Calculated: 298 mOsm/kg (ref 278–305)
Potassium: 4.3 mmol/L (ref 3.5–5.3)
Sodium: 141 mmol/L (ref 133–146)
Total Bilirubin: 0.8 mg/dL (ref 0.0–1.5)
Total Protein: 6.8 g/dL (ref 6.4–8.9)

## 2022-10-21 LAB — CBC
Hematocrit: 35.9 % (ref 35.0–45.0)
Hemoglobin: 12.1 g/dL (ref 11.7–15.5)
MCH: 31.9 pg (ref 27.0–33.0)
MCHC: 33.7 g/dL (ref 32.0–36.0)
MCV: 94.6 fL (ref 80.0–100.0)
MPV: 9 fL (ref 7.5–11.5)
Platelets: 152 10*3/uL (ref 140–400)
RBC: 3.8 10*6/uL (ref 3.80–5.10)
RDW: 14.5 % (ref 11.0–15.0)
WBC: 8 10*3/uL (ref 3.8–10.8)

## 2022-10-21 LAB — DIFFERENTIAL
Basophils Absolute: 64 /uL (ref 0–200)
Basophils Relative: 0.8 % (ref 0.0–1.0)
Eosinophils Absolute: 320 /uL (ref 15–500)
Eosinophils Relative: 4 % (ref 0.0–8.0)
Lymphocytes Absolute: 1952 /uL (ref 850–3900)
Lymphocytes Relative: 24.4 % (ref 15.0–45.0)
Monocytes Absolute: 824 /uL (ref 200–950)
Monocytes Relative: 10.3 % (ref 0.0–12.0)
Neutrophils Absolute: 4840 /uL (ref 1500–7800)
Neutrophils Relative: 60.5 % (ref 40.0–80.0)
nRBC: 0 /100 WBC (ref 0–0)

## 2022-10-21 NOTE — Progress Notes (Signed)
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)  Staging form: Lung, AJCC 8th Edition  - Clinical: No stage assigned - Unsigned  - Pathologic stage from 12/11/2016: Stage IA2 (pT1b, pN0, cM0) - Signed by Janett Billow, MD on 12/11/2016    Malignant neoplasm of upper lobe of left lung (CMS-HCC)  Staging form: Lung, AJCC 8th Edition  - Pathologic stage from 12/04/2021: Stage IIB (pT1a, pN1, cM0) - Signed by Janett Billow, MD on 12/04/2021      GENOMICS:  EGFR mt negative  PD-L1 95%  ALK-EML4 fusion positive per ALCHEMIST screening trial testing    SPECIAL MEDICAL ISSUES: 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 11/18/21: Left VATS lingulectomy  2. 01/02/22 - 03/09/22: Adjuvant cisplatin, pemetrexed (C2+ dose reduced due to intolerance - n/v/fatigue)  3. 10/06/22: Adjuvant Alectinib PO    ECOG PERFORMANCE STATUS: (1) Restricted in physically strenuous activity but ambulatory and able to carry out work of a light or sedentary nature, e.g., light house work, office work      INTERVAL HISTORY:  Joanna Lopez is here for follow up of her lung cancer. She started PO Alectinib on 5/28. Feeling well. Tolerating medication well. Weight down ~1 lb. Appetite is good, eating and drinking well. Fatigue stable, believes this is 2/2 gabapentin. Pain is much improved with this. Numbness and tingling in hands and feet, feels gritty.  Denies nausea/vomiting, no longer taking zyprexa or zofran at this time. Denies diarrhea, constipation, blood in stools, shortness of breath, cough, fevers, night sweats. No other complaints.      MEDICATIONS:  Current Outpatient Medications   Medication Sig    alectinib Take 4 capsules (600 mg) by mouth 2 times a day. Indications: ALK Positive Non-Small Cell Lung Cancer    aspirin Take 1 tablet (325 mg total) by mouth daily.    atorvastatin Take 1 tablet (80 mg total) by mouth daily.    biotin Chew by mouth.    busPIRone Take 1 tablet (10 mg total) by mouth in the morning and at bedtime.    calcium carbonate (CALCIUM 300 ORAL) Take 1 tablet by mouth daily.    gabapentin Take 1 capsule (300 mg total) by mouth 3 times a day. (Patient taking differently: Take 1 capsule (300 mg total) by mouth daily.)    lipase/protease/amylase (CREON ORAL) Take 1 capsule by mouth 3 times a day.    lisinopriL Take 1 tablet (10 mg total) by mouth daily. 10 mg (half tablet) daily    MELATONIN ORAL Take 3-10 mg by mouth at bedtime. PRN for sleep    multivitamin Take 1 tablet by mouth daily.    naloxone Apply 1 spray in one nostril if needed. Call 911. May repeat dose in other nostril if no response in 3 minutes.    PARoxetine Take 1 tablet (20 mg total) by mouth daily.    roPINIRole Take 1 tablet (1 mg total) by mouth daily.     No current facility-administered medications for this visit.  ALLERGIES:    Penicillins, Adhesive, Formaldehyde analogues, Nsaids (non-steroidal anti-inflammatory drug), Promethazine hcl, Latex, and Sulfa (sulfonamide antibiotics)    REVIEW OF SYSTEMS:  Review of Systems   Constitutional:  Negative for chills, diaphoresis, fever, malaise/fatigue and weight loss.   HENT:  Negative for congestion, hearing loss, sore throat and tinnitus.    Eyes:  Negative for blurred vision and double vision.   Respiratory:  Negative for cough, hemoptysis and shortness of breath.    Cardiovascular:  Negative for chest pain, palpitations and leg swelling.   Gastrointestinal:  Negative for abdominal pain, constipation, diarrhea, nausea and vomiting.   Genitourinary:  Negative for dysuria, flank pain and urgency.    Musculoskeletal:  Negative for joint pain, myalgias and neck pain.   Skin:  Negative for itching and rash.   Neurological:  Negative for dizziness, weakness and headaches.   Psychiatric/Behavioral:  Negative for depression, substance abuse and suicidal ideas. The patient is not nervous/anxious.    All other systems reviewed and are negative.      PHYSICAL EXAMINATION:  Vitals:    10/21/22 1337   BP: 112/74   BP Location: Right upper arm   Pulse: 70   Resp: 18   Temp: 97.3 F (36.3 C)   TempSrc: Temporal   SpO2: 94%   Weight: 187 lb (84.8 kg)   Height: 5' 6.5 (1.689 m)      Body surface area is 1.99 meters squared.    Physical Exam  Constitutional:       General: She is not in acute distress.     Appearance: Normal appearance. She is normal weight. She is not ill-appearing.   HENT:      Head: Normocephalic and atraumatic.      Nose: Nose normal.      Mouth/Throat:      Mouth: Mucous membranes are moist.      Pharynx: Oropharynx is clear.   Eyes:      General: No scleral icterus.     Extraocular Movements: Extraocular movements intact.      Conjunctiva/sclera: Conjunctivae normal.   Cardiovascular:      Rate and Rhythm: Normal rate.      Pulses: Normal pulses.   Pulmonary:      Effort: Pulmonary effort is normal. No respiratory distress.   Abdominal:      General: Abdomen is flat. There is no distension.   Musculoskeletal:         General: No swelling or tenderness. Normal range of motion.      Cervical back: Normal range of motion.   Skin:     General: Skin is warm and dry.      Coloration: Skin is not jaundiced.      Findings: No rash.   Neurological:      General: No focal deficit present.      Mental Status: She is alert and oriented to person, place, and time. Mental status is at baseline.      Cranial Nerves: No cranial nerve deficit.      Gait: Gait normal.   Psychiatric:         Mood and Affect: Mood normal.         Behavior: Behavior normal.         Thought Content: Thought content normal.        LABORATORY DATA:  Lab Results   Component Value Date    WBC 9.9 08/14/2022    RBC 4.01 08/14/2022    HGB  12.6 08/14/2022    HCT 37.5 08/14/2022    MCV 93.5 08/14/2022    MCH 31.5 08/14/2022    MCHC 33.7 08/14/2022    RDW 13.9 08/14/2022    PLT 144 08/14/2022        Lab Results   Component Value Date    GLUCOSE 80 08/14/2022    BUN 28 (H) 08/14/2022    CO2 25 08/14/2022    CREATININE 1.12 08/14/2022    K 4.4 08/14/2022    NA 139 08/14/2022    CL 107 08/14/2022    CALCIUM 9.5 08/14/2022    ALBUMIN 4.0 08/14/2022    PROT 6.7 08/14/2022    ALKPHOS 60 08/14/2022    ALT 21 08/14/2022    AST 21 08/14/2022    BILITOT 0.5 08/14/2022     No results found for: TSH, T3TOTAL, T4TOTAL, T3FREE, FREET4, THYROIDAB     RADIOLOGY REVIEW:  Images personally reviewed and compared to priors  CT Chest With IV contrast    Result Date: 09/23/2022  IMPRESSION: No evidence of intrathoracic metastatic disease. Report Verified by: MichaelLeretha Pol at 09/23/2022 11:08 AM EDT    IR rem CVAD with port or pump    Result Date: 09/09/2022  IMPRESSION: Successful removal of a rightleft chest port.  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 Started adjuvant alectinib on 10/06/22. Labs drawn today, pending. Tolerating medication acceptably at this point. No new toxicities/side effects from systemic therapy. Continue adjuvant alectinib at this time  - Will continue planned surveillance with CT imaging in 3 months - next CT chest scheduled for 12/23/22.    - RTC after next scans, scheduled for 8/16. Or sooner if needed    2) Continues following with audiology  - Next audiogram scheduled for 6/18    3) Neuropathy  - Taking Gabapentin 300 mg po daily - very helpful for neurological pain  - referral placed to massage therapy per patient's request. Also discussed referral to PMR, but patient declined at this  time. Will let us knoKoreaw if she changes her mind     Any elements of the HPI and Assessment/Plan copied from prior notes have been updated where appropriate and all reflect current medical decision making from today, 10/21/2022. Physical examination listed above was completed in entirety today, 10/21/2022.      I attest that the information that I have pulled forward from the previous office visit note has been reviewed and is accurate to today's visit.     Kamaile Zachow A. Mikey Bussing, PA-C

## 2022-10-27 ENCOUNTER — Ambulatory Visit: Admit: 2022-10-27 | Discharge: 2022-10-27 | Payer: MEDICARE

## 2022-10-27 DIAGNOSIS — H903 Sensorineural hearing loss, bilateral: Secondary | ICD-10-CM

## 2022-10-27 MED FILL — ALECTINIB 150 MG CAPSULE: 150 150 mg | ORAL | 30 days supply | Qty: 240 | Fill #1

## 2022-10-27 NOTE — Progress Notes (Signed)
Joanna Lopez  [birth year]  [id number]    Ototoxic Monitoring Survey      12/31/2021     4:00 PM 02/13/2022     9:00 AM 04/06/2022    10:00 AM 04/27/2022    11:00 AM 10/27/2022     2:00 PM   AMB ENT OTOTOXIC MONITORING   Referral Source oncology - Dr. Levander Campion, MD C. Zerita Boers, MD C. Zerita Boers, MD Zerita Boers, MD   Test Interval Type Baseline Follow up Follow up Follow up Follow up   Test interval length  6 weeks after start of treatment 4 weeks since last, 3 mo since start of treatment 3 weeks since last, 16 weeks since start of treatment 6 months since ending treatment   Hearing loss onset Feels hearing is normal Pt denies change Does not perceive any     Tinnitus? No Bilateral Bilateral Bilateral Bilateral   Family members with hearing loss Yes       If yes, who? Mother - later in life; worn hearing aids for 5-10 years       History of noise exposure? No       Any balance concerns- dizziness, vertigo, imbalance? slight since surgery; getting better Nothing new No new None noted None   Hearing aid user? No    No   Otoscopy? Non-occluding cerumen Non-occluding cerumen WNL WNL WNL   Tympanometry type Type A Au A A A A   Hearing Loss Grading N/A N/A 1 N/A N/A   Tinnitus Grading N/A 1 1 1 1    Vertigo Grading N/A N/A N/A N/A N/A       Ototoxic Monitoring Air Conduction Results          10/27/2022    14:00   Air Conduction Results   125 Hz- Right 10   250 Hz- Right 10   500 Hz- Right 5   1000 Hz- Right 10   2000 Hz- Right 25   3000 Hz- Right 20   4000 Hz- Right 25   6000 Hz- Right 40   8000 Hz- Right 55   9000 Hz- Right 65   10 kHz- Right 60   11.2 kHz- Right 60   12.5 kHz- Right 80   14 kHz- Right NR   16 kHz- Right NR   18 kHz- Right NR   20 kHz- Right NR   125 Hz- Left 20   250 Hz- Left 15   500 Hz- Left 15   1000 Hz- Left 15   2000 Hz- Left 15   3000 Hz- Left 20   4000 Hz- Left 15   6000 Hz- Left 25   8000 Hz- Left 45   9000 Hz- Left 55   10 kHz- Left 60   11.2 kHz- Left 65   12.5 kHz- Left 75   14 kHz- Left  80   16 kHz- Left NR   18 kHz- Left NR   20 kHz- Left NR   Bone Conduction Results   500 Hz- Right 10   1000 Hz- Right 10   2000 Hz- Right 15   3000 Hz- Right 20   4000 Hz- Right 25   500 Hz- Left 10   1000 Hz- Left 10   2000 Hz- Left 15   3000 Hz- Left 20   4000 Hz- Left 10     Pt is here for ototoxicity monitoring, reports she finished treatment at the end of November (6 months since end of treatment).  Pt denies perceived changes in hearing or tinnitus.  Pt denies dizziness or imbalance.    Grading Scale for change hearing loss:  None-No Change from Baseline   Noted improvement since December audiogram in [facility], [place].    Grading Scale Tinnitus:  Grade 1- Mild Symptoms Intervention not indicated     Grading Scale Vertigo:  N/A    Results reviewed with patient.   Pt was escorted to the front desk and scheduled to return in six months for ototoxicity audiogram (per protocol, after that, annually for first five years).    Zollie Pee, AuD

## 2022-11-25 MED FILL — ALECTINIB 150 MG CAPSULE: 150 150 mg | ORAL | 30 days supply | Qty: 240 | Fill #2

## 2022-12-23 ENCOUNTER — Inpatient Hospital Stay: Admit: 2022-12-23 | Discharge: 2022-12-30 | Payer: MEDICARE

## 2022-12-23 DIAGNOSIS — C3412 Malignant neoplasm of upper lobe, left bronchus or lung: Secondary | ICD-10-CM

## 2022-12-23 MED FILL — ALECTINIB 150 MG CAPSULE: 150 150 mg | ORAL | 30 days supply | Qty: 240 | Fill #3

## 2022-12-25 ENCOUNTER — Ambulatory Visit: Payer: MEDICARE

## 2022-12-25 NOTE — Telephone Encounter (Signed)
Dr. Zerita Boers  [facility]    Patient calling nurse triage line reporting an emergency just came up. She states she will not make it to her appointment today and would like to reschedule.    Please call patient to assist with rescheduling

## 2023-01-01 ENCOUNTER — Ambulatory Visit: Admit: 2023-01-01 | Discharge: 2023-01-01 | Payer: MEDICARE

## 2023-01-01 ENCOUNTER — Ambulatory Visit: Admit: 2023-01-01 | Payer: MEDICARE

## 2023-01-01 DIAGNOSIS — C3412 Malignant neoplasm of upper lobe, left bronchus or lung: Secondary | ICD-10-CM

## 2023-01-01 DIAGNOSIS — R112 Nausea with vomiting, unspecified: Secondary | ICD-10-CM

## 2023-01-01 LAB — COMPREHENSIVE METABOLIC PANEL
ALT: 22 U/L (ref 7–52)
AST: 24 U/L (ref 13–39)
Albumin: 4.2 g/dL (ref 3.5–5.7)
Alkaline Phosphatase: 107 U/L (ref 36–125)
Anion Gap: 4 mmol/L (ref 3–16)
BUN: 42 mg/dL — ABNORMAL HIGH (ref 7–25)
CO2: 24 mmol/L (ref 21–33)
Calcium: 9.6 mg/dL (ref 8.6–10.3)
Chloride: 113 mmol/L — ABNORMAL HIGH (ref 98–110)
Creatinine: 1.47 mg/dL — ABNORMAL HIGH (ref 0.60–1.30)
EGFR: 38
Glucose: 89 mg/dL (ref 70–100)
Osmolality, Calculated: 302 mOsm/kg (ref 278–305)
Potassium: 5.3 mmol/L (ref 3.5–5.3)
Sodium: 141 mmol/L (ref 133–146)
Total Bilirubin: 0.8 mg/dL (ref 0.0–1.5)
Total Protein: 6.6 g/dL (ref 6.4–8.9)

## 2023-01-01 LAB — DIFFERENTIAL
Basophils Absolute: 100 /uL (ref 0–200)
Basophils Relative: 1 % (ref 0.0–1.0)
Eosinophils Absolute: 230 /uL (ref 15–500)
Eosinophils Relative: 2.3 % (ref 0.0–8.0)
Lymphocytes Absolute: 3070 /uL (ref 850–3900)
Lymphocytes Relative: 30.7 % (ref 15.0–45.0)
Monocytes Absolute: 900 /uL (ref 200–950)
Monocytes Relative: 9 % (ref 0.0–12.0)
Neutrophils Absolute: 5700 /uL (ref 1500–7800)
Neutrophils Relative: 57 % (ref 40.0–80.0)
nRBC: 0 /100 WBC (ref 0–0)

## 2023-01-01 LAB — CBC
Hematocrit: 33.3 % — ABNORMAL LOW (ref 35.0–45.0)
Hemoglobin: 11.2 g/dL — ABNORMAL LOW (ref 11.7–15.5)
MCH: 32.8 pg (ref 27.0–33.0)
MCHC: 33.7 g/dL (ref 32.0–36.0)
MCV: 97.4 fL (ref 80.0–100.0)
MPV: 8.9 fL (ref 7.5–11.5)
Platelets: 168 10*3/uL (ref 140–400)
RBC: 3.42 10*6/uL — ABNORMAL LOW (ref 3.80–5.10)
RDW: 14.8 % (ref 11.0–15.0)
WBC: 9.7 10*3/uL (ref 3.8–10.8)

## 2023-01-01 LAB — CK: Total CK: 87 U/L (ref 30–223)

## 2023-01-01 LAB — PHOSPHORUS: Phosphorus: 3.8 mg/dL (ref 2.1–4.5)

## 2023-01-01 LAB — MAGNESIUM: Magnesium: 1.9 mg/dL (ref 1.5–2.5)

## 2023-01-01 MED ORDER — polyethylene glycol (MIRALAX) 17 gram/dose powder
17 | Freq: Every day | ORAL | 0 refills | 14.00000 days | Status: AC
Start: 2023-01-01 — End: ?

## 2023-01-01 MED ORDER — loperamide (IMODIUM A-D) 2 mg tablet
2 | ORAL_TABLET | ORAL | 2 refills | Status: AC
Start: 2023-01-01 — End: 2023-01-31

## 2023-01-01 MED ORDER — sodium chloride 0.9 % IV infusion
Freq: Once | INTRAVENOUS | Status: AC
Start: 2023-01-01 — End: 2023-01-01
  Administered 2023-01-01: 15:00:00 via INTRAVENOUS

## 2023-01-01 MED ORDER — ondansetron (ZOFRAN) 8 MG tablet
8 | ORAL_TABLET | Freq: Three times a day (TID) | ORAL | 3 refills | Status: AC | PRN
Start: 2023-01-01 — End: ?

## 2023-01-01 NOTE — Progress Notes (Signed)
Patient arrived to infusion center for urgent add visit for IV hydration. 22g PIV started in right forearm and flushed with +BR noted.    1050- IV hydration infusion started with +BR noted.  1150- IV hydration infusion completed. PIV flushed with +BR noted and removed with gauze/coban applied to site. Patient tolerated well.    Patient left ambulatory in stable condition. AVS denied.

## 2023-01-01 NOTE — Progress Notes (Signed)
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)  Staging form: Lung, AJCC 8th Edition  - Clinical: No stage assigned - Unsigned  - Pathologic stage from 12/11/2016: Stage IA2 (pT1b, pN0, cM0) - Signed by Janett Billow, MD on 12/11/2016    Malignant neoplasm of upper lobe of left lung (CMS-HCC)  Staging form: Lung, AJCC 8th Edition  - Pathologic stage from 12/04/2021: Stage IIB (pT1a, pN1, cM0) - Signed by Janett Billow, MD on 12/04/2021      GENOMICS:  EGFR mt negative  PD-L1 95%  ALK-EML4 fusion positive per ALCHEMIST screening trial testing    SPECIAL MEDICAL ISSUES: 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 11/18/21: Left VATS lingulectomy  2. 01/02/22 - 03/09/22: Adjuvant cisplatin, pemetrexed (C2+ dose reduced due to intolerance - n/v/fatigue)  3. 10/06/22: Adjuvant Alectinib PO    ECOG PERFORMANCE STATUS: (1) Restricted in physically strenuous activity but ambulatory and able to carry out work of a light or sedentary nature, e.g., light house work, office work      INTERVAL HISTORY:  Joanna Lopez is here for follow up of her lung cancer. She started PO Alectinib on 5/28. Weight up 2 lbs. Appetite stable - eating and drinking well. C/o memory issues, and fogginess. C/o continued/worsening neuropathy in hands and feet, up to knees and elbows. Not painful. Described as pins and needles. Complains that feet went numb while driving and she and had to stop car/pull over. C/o continued cycle of diarrhea x4 days, so severe that she has to wear a diaper and stay home. Described as uncontrollable. After this  bowels are regular for 1 week, then she c/o constipation. Uses occasional imodium, not up to 8 tabs daily. C/o lightheadedness, especially when go from sitting to standing and with activity. Sleeping a lot more at night and napping throughout the day. C/o nausea and vomiting, intermittent. Zofran 8 mg helpful, but nausea/vomiting is usually too quick of onset for patient to take medication with relief. Denies fevers/chills/night sweats or pain. Not taking pain medication at this time. No other issues/complaints.     MEDICATIONS:  Current Outpatient Medications   Medication Sig    ondansetron Take 1 tablet (8 mg total) by mouth every 8 hours as needed for Nausea.    alectinib Take 4 capsules (600 mg) by mouth 2 times a day. Indications: ALK Positive Non-Small Cell Lung Cancer    aspirin Take 1 tablet (325 mg total) by mouth daily.    atorvastatin Take 1 tablet (80 mg total) by mouth daily.    biotin Chew by mouth.    busPIRone Take 1 tablet (10 mg total) by mouth in the morning and at bedtime.    calcium carbonate (CALCIUM 300 ORAL) Take 1 tablet by mouth daily.    gabapentin Take 1 capsule (300 mg total) by mouth 3 times a day. (Patient taking differently: Take 1 capsule (300 mg total) by mouth daily.)    lipase/protease/amylase (CREON ORAL) Take 1 capsule by mouth 3 times  a day.    lisinopriL Take 1 tablet (10 mg total) by mouth daily. 10 mg (half tablet) daily    loperamide Take 2 tabs po after first loose stool and then 1 tab po after each additional loose stool, up to 8 tabs per day.    MELATONIN ORAL Take 3-10 mg by mouth at bedtime. PRN for sleep    multivitamin Take 1 tablet by mouth daily.    naloxone Apply 1 spray in one nostril if needed. Call 911. May repeat dose in other nostril if no response in 3 minutes.    PARoxetine Take 1 tablet (20 mg total) by mouth daily.    polyethylene glycol Take 17 g by mouth daily.    roPINIRole Take 1 tablet (1 mg total) by mouth daily.     No current facility-administered  medications for this visit.       ALLERGIES:    Penicillins, Adhesive, Formaldehyde analogues, Nsaids (non-steroidal anti-inflammatory drug), Promethazine hcl, Latex, and Sulfa (sulfonamide antibiotics)    REVIEW OF SYSTEMS:  Review of Systems   Constitutional:  Positive for malaise/fatigue. Negative for chills, diaphoresis, fever and weight loss.   HENT:  Negative for congestion, hearing loss, sore throat and tinnitus.    Eyes:  Negative for blurred vision and double vision.   Respiratory:  Negative for cough, hemoptysis and shortness of breath.    Cardiovascular:  Negative for chest pain, palpitations and leg swelling.   Gastrointestinal:  Positive for constipation, diarrhea, nausea and vomiting. Negative for abdominal pain.   Genitourinary:  Negative for dysuria, flank pain and urgency.   Musculoskeletal:  Negative for joint pain, myalgias and neck pain.   Skin:  Negative for itching and rash.   Neurological:  Negative for dizziness, weakness and headaches.   Psychiatric/Behavioral:  Negative for depression, substance abuse and suicidal ideas. The patient is not nervous/anxious.    All other systems reviewed and are negative.      PHYSICAL EXAMINATION:  Vitals:    01/01/23 0947   BP: 104/61   BP Location: Left upper arm   Pulse: 73   Resp: 16   Temp: 97.7 F (36.5 C)   TempSrc: Temporal   SpO2: 95%   Weight: 189 lb (85.7 kg)   Height: 5' 6.5 (1.689 m)        Body surface area is 2.01 meters squared.    Physical Exam  Constitutional:       General: She is not in acute distress.     Appearance: Normal appearance. She is normal weight. She is not ill-appearing.   HENT:      Head: Normocephalic and atraumatic.      Nose: Nose normal.      Mouth/Throat:      Mouth: Mucous membranes are moist.      Pharynx: Oropharynx is clear.   Eyes:      General: No scleral icterus.     Extraocular Movements: Extraocular movements intact.      Conjunctiva/sclera: Conjunctivae normal.   Cardiovascular:      Rate and Rhythm: Normal  rate.      Pulses: Normal pulses.   Pulmonary:      Effort: Pulmonary effort is normal. No respiratory distress.   Abdominal:      General: Abdomen is flat. There is no distension.   Musculoskeletal:         General: No swelling or tenderness. Normal range of motion.      Cervical back: Normal range  of motion.   Skin:     General: Skin is warm and dry.      Coloration: Skin is not jaundiced.      Findings: No rash.   Neurological:      General: No focal deficit present.      Mental Status: She is alert and oriented to person, place, and time. Mental status is at baseline.      Cranial Nerves: No cranial nerve deficit.      Gait: Gait normal.   Psychiatric:         Mood and Affect: Mood normal.         Behavior: Behavior normal.         Thought Content: Thought content normal.       LABORATORY DATA:  Lab Results   Component Value Date    WBC 9.7 01/01/2023    RBC 3.42 (L) 01/01/2023    HGB 11.2 (L) 01/01/2023    HCT 33.3 (L) 01/01/2023    MCV 97.4 01/01/2023    MCH 32.8 01/01/2023    MCHC 33.7 01/01/2023    RDW 14.8 01/01/2023    PLT 168 01/01/2023        Lab Results   Component Value Date    GLUCOSE 89 01/01/2023    BUN 42 (H) 01/01/2023    CO2 24 01/01/2023    CREATININE 1.47 (H) 01/01/2023    K 5.3 01/01/2023    NA 141 01/01/2023    CL 113 (H) 01/01/2023    CALCIUM 9.6 01/01/2023    ALBUMIN 4.2 01/01/2023    PROT 6.6 01/01/2023    ALKPHOS 107 01/01/2023    ALT 22 01/01/2023    AST 24 01/01/2023    BILITOT 0.8 01/01/2023     No results found for: TSH, T3TOTAL, T4TOTAL, T3FREE, FREET4, THYROIDAB     RADIOLOGY REVIEW:  Images personally reviewed and compared to priors  CT Chest WO contrast    Result Date: 12/23/2022  IMPRESSION: No evidence of intrathoracic metastatic disease. Approved by Laurence Aly, MD on 12/23/2022 11:41 AM EDT I have personally reviewed the images and I agree with this report. Report Verified by: Blenda Peals, MD at 12/23/2022 2:32 PM EDT       ASSESSMENT AND PLAN:  1) Stage IIB  pT1aN1M0 NSCLC adenocarcinoma of the Left lung lingula, ALK+  - CT chest wo contrast checked 12/23/22, as above. No definite progression of disease. Will plan to repeat in ~3 mo, or sooner if indicated.   - Clinically well. Started adjuvant alectinib on 10/06/22. Labs drawn today, reviewed personally and with patient. Tolerating medication acceptably at this point. Worsening numbness/tingling in hands and feet, increased weakness/fatigue are possible new/worsening toxicities/side effects from systemic therapy. Continue adjuvant alectinib at this time at full dose. Will monitor symptoms and can consider dose reduction at next office visit prn.  - Intermittent nausea and vomiting - zofran 8 mg po refilled today   - RTC after next scans, scheduled for 8/16. Or sooner if needed    2) Continues following with audiology  - Next audiogram scheduled for 6/18, cleared    3) Neuropathy  - Taking Gabapentin 300 mg po daily - very helpful for neurological pain. Denies neurological pain at this time.   - Previously placed referral to massage therapy per patient's request. Also discussed referral to PMR, but patient declined at this time. Will let us know if she changes her mind   - Discussed acupuncture today, patient would  like to try. Will check with insurance to make sure this would be covered. Referral placed today     4) AKI  - Cr 1.47 and BUN 42 today. Cr 1.63 and BUN 32 on 6/12  - 1 L NS/1 hr given in infusion today. Patient knows to push PO fluids at home, and notify our office if she would like to come in for more IVF in our infusion area     5) Diarrhea/constipation   - Discussed bowel regimen today. Recommend starting imodium as follows- 2 tabs at first bout of loose stool/diarrhea, followed by 1 tab with each subsequent bout of diarrhea. Script sent in to patient's preferred pharmacy today  - Recommended miralax for constipation. Prescribed today. Would recommend senna if miralax alone is not helpful. Can prescribe in  future prn     Any elements of the HPI and Assessment/Plan copied from prior notes have been updated where appropriate and all reflect current medical decision making from today, 01/01/2023. Physical examination listed above was completed in entirety today, 01/01/2023.      I attest that the information that I have pulled forward from the previous office visit note has been reviewed and is accurate to today's visit.     Judea Riches A. Mikey Bussing, PA-C

## 2023-01-22 NOTE — Telephone Encounter (Signed)
Call placed to patient. Confirmed she is aware of appointment with Dr. Zerita Boers as below.     Future Appointments   Date Time Provider Department Center   02/05/2023 12:10 PM Hildred Priest, MD [facility]   04/28/2023 10:00 AM Zollie Pee, Au.D. [facility]     Lynett Grimes BSN, RN, CCM  Oncology Nurse Navigator   [facility]   [address]  [place], [place] [address]  Office: [phone]  Direct: [phone]  Fax: [phone]'

## 2023-01-25 MED FILL — ALECTINIB 150 MG CAPSULE: 150 150 mg | ORAL | 30 days supply | Qty: 240 | Fill #4

## 2023-02-01 ENCOUNTER — Ambulatory Visit: Payer: MEDICARE

## 2023-02-05 ENCOUNTER — Ambulatory Visit: Admit: 2023-02-05 | Discharge: 2023-02-05 | Payer: MEDICARE

## 2023-02-05 DIAGNOSIS — C3412 Malignant neoplasm of upper lobe, left bronchus or lung: Secondary | ICD-10-CM

## 2023-02-05 MED ORDER — alectinib 150 mg Cap
150 | ORAL_CAPSULE | Freq: Two times a day (BID) | ORAL | 11 refills | Status: AC
Start: 2023-02-05 — End: ?
  Filled 2023-02-22: qty 180, 30d supply, fill #0

## 2023-02-05 NOTE — Progress Notes (Signed)
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)  Staging form: Lung, AJCC 8th Edition  - Clinical: No stage assigned - Unsigned  - Pathologic stage from 12/11/2016: Stage IA2 (pT1b, pN0, cM0) - Signed by Janett Billow, MD on 12/11/2016    Malignant neoplasm of upper lobe of left lung (CMS-HCC)  Staging form: Lung, AJCC 8th Edition  - Pathologic stage from 12/04/2021: Stage IIB (pT1a, pN1, cM0) - Signed by Janett Billow, MD on 12/04/2021      GENOMICS:  EGFR mt negative  PD-L1 95%  ALK-EML4 fusion positive per ALCHEMIST screening trial testing    SPECIAL MEDICAL ISSUES: 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 11/18/21: Left VATS lingulectomy  2. 01/02/22 - 03/09/22: Adjuvant cisplatin, pemetrexed (C2+ dose reduced due to intolerance - n/v/fatigue)  3. 10/06/22: Adjuvant Alectinib PO    ECOG PERFORMANCE STATUS: (1) Restricted in physically strenuous activity but ambulatory and able to carry out work of a light or sedentary nature, e.g., light house work, office work      INTERVAL HISTORY:  Joanna Lopez is here for follow up of her lung cancer. She is doing ok overall. Her biggest complaint is having some increased headaches recently that come on about every day. They get better with medication and feels like pressure in the head. Not similar to her old migraines. No neurologic changes come with them and otherwise she is eating and drinking well, energy is stable. She continues to have some neuropathy but this is stable as well. No new breathing issues.       MEDICATIONS:  Current Outpatient Medications    Medication Sig    alectinib Take 3 capsules (450 mg) by mouth 2 times a day.    aspirin Take 1 tablet (325 mg total) by mouth daily.    atorvastatin Take 1 tablet (80 mg total) by mouth daily.    biotin Chew by mouth.    busPIRone Take 1 tablet (10 mg total) by mouth in the morning and at bedtime.    calcium carbonate (CALCIUM 300 ORAL) Take 1 tablet by mouth daily.    gabapentin Take 1 capsule (300 mg total) by mouth 3 times a day. (Patient taking differently: Take 1 capsule (300 mg total) by mouth daily.)    lipase/protease/amylase (CREON ORAL) Take 1 capsule by mouth 3 times a day.    lisinopriL Take 1 tablet (10 mg total) by mouth daily. 10 mg (half tablet) daily    MELATONIN ORAL Take 3-10 mg by mouth at bedtime. PRN for sleep    multivitamin Take 1 tablet by mouth daily.    naloxone Apply 1 spray in one nostril if needed. Call 911. May repeat dose in other nostril if no response in 3 minutes.    ondansetron Take 1 tablet (8 mg total) by mouth every 8 hours as needed for Nausea.    PARoxetine Take 1 tablet (20 mg total) by mouth daily.    polyethylene glycol Take 17 g by mouth daily.  roPINIRole Take 1 tablet (1 mg total) by mouth daily.     No current facility-administered medications for this visit.       ALLERGIES:    Penicillins, Adhesive, Formaldehyde analogues, Nsaids (non-steroidal anti-inflammatory drug), Promethazine hcl, Latex, and Sulfa (sulfonamide antibiotics)    REVIEW OF SYSTEMS:  Review of Systems   Constitutional:  Positive for malaise/fatigue. Negative for chills, diaphoresis, fever and weight loss.   HENT:  Negative for congestion, hearing loss, sore throat and tinnitus.    Eyes:  Negative for blurred vision and double vision.   Respiratory:  Negative for cough, hemoptysis and shortness of breath.    Cardiovascular:  Negative for chest pain, palpitations and leg swelling.   Gastrointestinal:  Positive for constipation and nausea. Negative for abdominal pain, diarrhea and vomiting.    Genitourinary:  Negative for dysuria, flank pain and urgency.   Musculoskeletal:  Negative for joint pain, myalgias and neck pain.   Skin:  Negative for itching and rash.   Neurological:  Positive for sensory change and headaches. Negative for dizziness and weakness.   Psychiatric/Behavioral:  Negative for depression, substance abuse and suicidal ideas. The patient is not nervous/anxious.    All other systems reviewed and are negative.      PHYSICAL EXAMINATION:  Vitals:    02/05/23 1233   BP: 116/75   BP Location: Left upper arm   Pulse: 70   Resp: 16   Temp: 97.1 F (36.2 C)   TempSrc: Temporal   SpO2: 97%   Weight: 190 lb 6.4 oz (86.4 kg)        Body surface area is 2.01 meters squared.    Physical Exam  Vitals reviewed.   Constitutional:       General: She is not in acute distress.     Appearance: Normal appearance. She is normal weight. She is not ill-appearing.   HENT:      Head: Normocephalic and atraumatic.      Nose: Nose normal.      Mouth/Throat:      Mouth: Mucous membranes are moist.      Pharynx: Oropharynx is clear.   Eyes:      General: No scleral icterus.     Extraocular Movements: Extraocular movements intact.      Conjunctiva/sclera: Conjunctivae normal.   Cardiovascular:      Rate and Rhythm: Normal rate.      Pulses: Normal pulses.   Pulmonary:      Effort: Pulmonary effort is normal. No respiratory distress.   Abdominal:      General: Abdomen is flat. There is no distension.   Musculoskeletal:         General: No swelling or tenderness. Normal range of motion.      Cervical back: Normal range of motion.   Skin:     General: Skin is warm and dry.      Coloration: Skin is not jaundiced.      Findings: No rash.   Neurological:      General: No focal deficit present.      Mental Status: She is alert and oriented to person, place, and time. Mental status is at baseline.      Cranial Nerves: No cranial nerve deficit.      Gait: Gait normal.   Psychiatric:         Mood and Affect: Mood normal.          Behavior: Behavior normal.  Thought Content: Thought content normal.       LABORATORY DATA:  Lab Results   Component Value Date    WBC 9.7 01/01/2023    RBC 3.42 (L) 01/01/2023    HGB 11.2 (L) 01/01/2023    HCT 33.3 (L) 01/01/2023    MCV 97.4 01/01/2023    MCH 32.8 01/01/2023    MCHC 33.7 01/01/2023    RDW 14.8 01/01/2023    PLT 168 01/01/2023        Lab Results   Component Value Date    GLUCOSE 89 01/01/2023    BUN 42 (H) 01/01/2023    CO2 24 01/01/2023    CREATININE 1.47 (H) 01/01/2023    K 5.3 01/01/2023    NA 141 01/01/2023    CL 113 (H) 01/01/2023    CALCIUM 9.6 01/01/2023    ALBUMIN 4.2 01/01/2023    PROT 6.6 01/01/2023    ALKPHOS 107 01/01/2023    ALT 22 01/01/2023    AST 24 01/01/2023    BILITOT 0.8 01/01/2023     No results found for: TSH, T3TOTAL, T4TOTAL, T3FREE, FREET4, THYROIDAB     RADIOLOGY REVIEW:  Images personally reviewed and compared to priors  CT Chest WO contrast    Result Date: 12/23/2022  IMPRESSION: No evidence of intrathoracic metastatic disease. Approved by Laurence Aly, MD on 12/23/2022 11:41 AM EDT I have personally reviewed the images and I agree with this report. 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 Discussed options with her today and will plan for dose reduction to see if this can help some. New prescription for 450mg  BID sent to specialty pharmacy. Recent labs reviewed and stable. Continue monitoring for toxicities.  - Will check MRI brain given chronic headaches but her symptoms do not otherwise point to CNS disease, though her risk is elevated with ALK fusion  - Will check new CT chest for sur  - Intermittent nausea and vomiting - zofran 8 mg po   - RTC in November/December or sooner if needed. Will call if concerning findings on MRI brain    2) Neuropathy  - Taking Gabapentin 300  mg po daily - very helpful for neurological pain. Denies neurological pain at this time.       Any elements of the HPI and Assessment/Plan copied from prior notes have been updated where appropriate and all reflect current medical decision making from today, 02/05/2023. Physical examination listed above was completed in entirety today, 02/05/2023.    Hildred Priest, MD  Thoracic and Head & Neck Oncology  [facility]

## 2023-02-09 ENCOUNTER — Inpatient Hospital Stay: Admit: 2023-02-09 | Discharge: 2023-02-14 | Payer: MEDICARE

## 2023-02-09 DIAGNOSIS — C3412 Malignant neoplasm of upper lobe, left bronchus or lung: Secondary | ICD-10-CM

## 2023-02-09 MED ORDER — gadobutrol (GADAVIST) 1 mmol/1 mL IV solution 9 mL
1 | Freq: Once | INTRAVENOUS | Status: AC | PRN
Start: 2023-02-09 — End: 2023-02-09
  Administered 2023-02-09: 22:00:00 via INTRAVENOUS

## 2023-03-29 NOTE — Telephone Encounter (Signed)
 [facility] Note    Follow-Up Pharmacist Assessment     Joanna Lopez is a 73 y.o. patient who  has a past medical history of Anxiety, Basal cell cancer, Cataract, Hyperlipidemia, Hypertension, Lung nodule, OSA (obstructive sleep

## 2023-04-01 MED FILL — ALECTINIB 150 MG CAPSULE: 150 150 mg | ORAL | 30 days supply | Qty: 180 | Fill #1

## 2023-04-14 ENCOUNTER — Inpatient Hospital Stay: Admit: 2023-04-14 | Discharge: 2023-04-19 | Payer: MEDICARE

## 2023-04-14 DIAGNOSIS — C3412 Malignant neoplasm of upper lobe, left bronchus or lung: Secondary | ICD-10-CM

## 2023-04-16 ENCOUNTER — Ambulatory Visit: Admit: 2023-04-16 | Discharge: 2023-04-20 | Payer: MEDICARE

## 2023-04-16 DIAGNOSIS — C3412 Malignant neoplasm of upper lobe, left bronchus or lung: Secondary | ICD-10-CM

## 2023-04-16 LAB — COMPREHENSIVE METABOLIC PANEL
ALT: 17 U/L (ref 7–52)
AST: 20 U/L (ref 13–39)
Albumin: 4 g/dL (ref 3.5–5.7)
Alkaline Phosphatase: 108 U/L (ref 36–125)
Anion Gap: 4 mmol/L (ref 3–16)
BUN: 29 mg/dL — ABNORMAL HIGH (ref 7–25)
CO2: 28 mmol/L (ref 21–33)
Calcium: 8.9 mg/dL (ref 8.6–10.3)
Chloride: 109 mmol/L (ref 98–110)
Creatinine: 1.32 mg/dL — ABNORMAL HIGH (ref 0.60–1.30)
EGFR: 43
Glucose: 98 mg/dL (ref 70–100)
Osmolality, Calculated: 298 mosm/kg (ref 278–305)
Potassium: 4.7 mmol/L (ref 3.5–5.3)
Sodium: 141 mmol/L (ref 133–146)
Total Bilirubin: 1.2 mg/dL (ref 0.0–1.5)
Total Protein: 6.6 g/dL (ref 6.4–8.9)

## 2023-04-16 LAB — CBC
Hematocrit: 33.3 % — ABNORMAL LOW (ref 35.0–45.0)
Hemoglobin: 11.4 g/dL — ABNORMAL LOW (ref 11.7–15.5)
MCH: 33.2 pg — ABNORMAL HIGH (ref 27.0–33.0)
MCHC: 34.2 g/dL (ref 32.0–36.0)
MCV: 97.2 fL (ref 80.0–100.0)
MPV: 9 fL (ref 7.5–11.5)
Platelets: 200 10*3/uL (ref 140–400)
RBC: 3.43 10*6/uL — ABNORMAL LOW (ref 3.80–5.10)
RDW: 14.6 % (ref 11.0–15.0)
WBC: 13.6 10*3/uL — ABNORMAL HIGH (ref 3.8–10.8)

## 2023-04-16 LAB — CK: Total CK: 62 U/L (ref 30–223)

## 2023-04-16 LAB — DIFFERENTIAL
Basophils Absolute: 68 /uL (ref 0–200)
Basophils Relative: 0.5 % (ref 0.0–1.0)
Eosinophils Absolute: 231 /uL (ref 15–500)
Eosinophils Relative: 1.7 % (ref 0.0–8.0)
Lymphocytes Absolute: 3278 /uL (ref 850–3900)
Lymphocytes Relative: 24.1 % (ref 15.0–45.0)
Monocytes Absolute: 1306 /uL — ABNORMAL HIGH (ref 200–950)
Monocytes Relative: 9.6 % (ref 0.0–12.0)
Neutrophils Absolute: 8718 /uL — ABNORMAL HIGH (ref 1500–7800)
Neutrophils Relative: 64.1 % (ref 40.0–80.0)
nRBC: 0 /100{WBCs} (ref 0–0)

## 2023-04-16 NOTE — Progress Notes (Signed)
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, ALK+  DATE OF DIAGNOSIS: 11/18/21  STAGE:  Cancer Staging   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)  Staging form: Lung, AJCC 8th Edition  - Clinical: No stage assigned - Unsigned  - Pathologic stage from 12/11/2016: Stage IA2 (pT1b, pN0, cM0) - Signed by Janett Billow, MD on 12/11/2016    Malignant neoplasm of upper lobe of left lung (CMS-HCC)  Staging form: Lung, AJCC 8th Edition  - Pathologic stage from 12/04/2021: Stage IIB (pT1a, pN1, cM0) - Signed by Janett Billow, MD on 12/04/2021      GENOMICS:  EGFR mt negative  PD-L1 95%  ALK-EML4 fusion positive per ALCHEMIST screening trial testing    SPECIAL MEDICAL ISSUES: 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 11/18/21: Left VATS lingulectomy  2. 01/02/22 - 03/09/22: Adjuvant cisplatin, pemetrexed (C2+ dose reduced due to intolerance - n/v/fatigue)  3. 10/06/22: Adjuvant Alectinib PO (dose reduced to 450mg  BID on 02/05/23 due to nausea, constipation, fatigue)    ECOG PERFORMANCE STATUS: (1) Restricted in physically strenuous activity but ambulatory and able to carry out work of a light or sedentary nature, e.g., light house work, office work      INTERVAL HISTORY:  Joanna Lopez is here for follow up of her lung cancer. She is doing great. The medication adjustment helped a lot and her symptoms have resolved. She continues to have headaches frequently but these resolve quickly. No other complaints or concerns.      MEDICATIONS:  Current Outpatient Medications   Medication Sig    alectinib Take 3 capsules (450 mg) by mouth 2 times a day.    aspirin Take 1 tablet (325 mg total) by mouth daily.    atorvastatin Take  1 tablet (80 mg total) by mouth daily.    biotin Chew by mouth.    busPIRone Take 1 tablet (10 mg total) by mouth in the morning and at bedtime.    calcium carbonate (CALCIUM 300 ORAL) Take 1 tablet by mouth daily.    gabapentin Take 1 capsule (300 mg total) by mouth 3 times a day. (Patient taking differently: Take 1 capsule (300 mg total) by mouth daily.)    lipase/protease/amylase (CREON ORAL) Take 1 capsule by mouth 3 times a day.    lisinopriL Take 1 tablet (10 mg total) by mouth daily. 10 mg (half tablet) daily    MELATONIN ORAL Take 3-10 mg by mouth at bedtime. PRN for sleep    multivitamin Take 1 tablet by mouth daily.    ondansetron Take 1 tablet (8 mg total) by mouth every 8 hours as needed for Nausea.    PARoxetine Take 1 tablet (20 mg total) by mouth daily.    polyethylene glycol Take 17 g by mouth daily.    roPINIRole Take 1 tablet (1 mg total) by mouth daily.     No current facility-administered medications for this visit.       ALLERGIES:    Penicillins, Adhesive, Formaldehyde analogues, Nsaids (non-steroidal anti-inflammatory drug), Promethazine hcl, Latex, and Sulfa (sulfonamide antibiotics)    REVIEW OF SYSTEMS:  Review of Systems   Constitutional:  Negative for chills, fever, malaise/fatigue and weight loss.   HENT:  Negative for congestion, hearing loss, sore throat and tinnitus.    Eyes:  Negative for blurred vision and double vision.   Respiratory:  Negative for cough, hemoptysis and shortness of breath.    Cardiovascular:  Negative for chest pain, palpitations and leg swelling.   Gastrointestinal:  Negative for abdominal pain, constipation, diarrhea, nausea and vomiting.   Genitourinary:  Negative for dysuria, flank pain and urgency.   Musculoskeletal:  Negative for joint pain, myalgias and neck pain.   Skin:  Negative for itching and rash.   Neurological:  Positive for sensory change and headaches. Negative for dizziness and weakness.   Psychiatric/Behavioral:  Negative for depression,  substance abuse and suicidal ideas. The patient is not nervous/anxious.    All other systems reviewed and are negative.      PHYSICAL EXAMINATION:  Vitals:    04/16/23 1056   BP: 127/82   BP Location: Right upper arm   Pulse: 82   Resp: 16   Temp: 97.7 F (36.5 C)   TempSrc: Temporal   SpO2: 94%   Weight: 193 lb 3.2 oz (87.6 kg)        Body surface area is 2.03 meters squared.    Physical Exam  Vitals reviewed.   Constitutional:       General: She is not in acute distress.     Appearance: Normal appearance. She is normal weight. She is not ill-appearing.   HENT:      Head: Normocephalic and atraumatic.      Nose: Nose normal.      Mouth/Throat:      Mouth: Mucous membranes are moist.      Pharynx: Oropharynx is clear.   Eyes:      General: No scleral icterus.     Extraocular Movements: Extraocular movements intact.      Conjunctiva/sclera: Conjunctivae normal.   Cardiovascular:      Rate and Rhythm: Normal rate.      Pulses: Normal pulses.   Pulmonary:      Effort: Pulmonary effort is normal. No respiratory distress.   Abdominal:      General: Abdomen is flat. There is no distension.   Musculoskeletal:         General: No swelling or tenderness. Normal range of motion.      Cervical back: Normal range of motion.   Skin:     General: Skin is warm and dry.      Coloration: Skin is not jaundiced.      Findings: No rash.   Neurological:      General: No focal deficit present.      Mental Status: She is alert and oriented to person, place, and time. Mental status is at baseline.      Cranial Nerves: No cranial nerve deficit.      Gait: Gait normal.   Psychiatric:         Mood and Affect: Mood normal.         Behavior: Behavior normal.         Thought Content: Thought content normal.       LABORATORY DATA:  Lab Results   Component Value Date    WBC 13.6 (H) 04/16/2023    RBC 3.43 (L) 04/16/2023    HGB 11.4 (L) 04/16/2023    HCT 33.3 (L) 04/16/2023    MCV 97.2 04/16/2023    MCH  33.2 (H) 04/16/2023    MCHC 34.2 04/16/2023     RDW 14.6 04/16/2023    PLT 200 04/16/2023        Lab Results   Component Value Date    GLUCOSE 98 04/16/2023    BUN 29 (H) 04/16/2023    CO2 28 04/16/2023    CREATININE 1.32 (H) 04/16/2023    K 4.7 04/16/2023    NA 141 04/16/2023    CL 109 04/16/2023    CALCIUM 8.9 04/16/2023    ALBUMIN 4.0 04/16/2023    PROT 6.6 04/16/2023    ALKPHOS 108 04/16/2023    ALT 17 04/16/2023    AST 20 04/16/2023    BILITOT 1.2 04/16/2023     No results found for: TSH, T3TOTAL, T4TOTAL, T3FREE, FREET4, THYROIDAB     RADIOLOGY REVIEW:  Images personally reviewed and compared to priors  CT Chest WO contrast    Result Date: 04/14/2023  IMPRESSION: No evidence of recurrent or metastatic disease. 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   - Scans show NED still  - Continue current treatment with plan to continue alectinib for 2 years.  - Will check new CT chest for surveillance in 3-6 months. Will check MRI brain every 74mo-1 year given high risk ALK mutation  - RTC in 3-6 months or sooner if needed with scan.     2) Neuropathy  - Taking Gabapentin 300 mg po daily - very helpful for neurological pain. Denies neurological pain at this time.       Any elements of the HPI and Assessment/Plan copied from prior notes have been updated where appropriate and all reflect current medical decision making from today, 04/16/2023. Physical examination listed above was completed in entirety today, 04/16/2023.    Hildred Priest, MD  Thoracic and Head & Neck Oncology  [facility]

## 2023-04-22 NOTE — ED Notes (Signed)
ED to Inpatient SB[facility] Hand-off  Situation  Joanna Lopez   [id number]    72 y.o.             [facility] ED ER 10/ER 10       Allergies   Allergen Reactions    Adhesive      Other reaction(s): breaks out and itches if on for long time    Formaldehyde Analogues Swelling     in nail polish and rash and swelling    Nsaids (Non-Steroidal Anti-Inflammatory Drug)      Severe GI issues    Penicillins     Promethazine Hcl      Restless legs    Sulfa (Sulfonamide Antibiotics)     Latex Rash     States she has a sensitivity to latex after being around it for days, she gets a rash: she states that when she wore her cpap machine for 14 days she developed a rash on her face other than this she has no problems stating her dentist wears latex gloves and she has no problems with latex gloves.     States she has a sensitivity to latex after being around it for days, she gets a rash: she states that when she wore her cpap machine for 14 days she developed a rash on her face other than this she has no problems stating her dentist wears latex gloves and she has no problems with latex gloves.      Isolation Status: No active isolations    Ideal body weight: 59.3 kg (130 lb 11.7 oz)  Adjusted ideal body weight: 71.5 kg (157 lb 8.9 oz)    Last Filed Weights    04/22/23 1912   Weight: 197 lb 12.8 oz (89.7 kg)        Weight Method: Bed  Most recent height: 5' 6 (1.676 m)     PTA Med List Status:  In Progress    Marked as Reviewed:  Yes      Code Status:No Order   Are any Caregivers Present? no  Patient arrived from: Home    Chief Complaint   Patient presents with    Leg Pain        Final diagnoses:   None        CT-HEAD W/O CONTRAST   Final Result      1.  No acute intracranial abnormality.      Electronically signed by Tedra Coupe, MD      XR-CHEST PORTABLE STAT   Final Result      1. No acute disease.      Electronically signed by Gwenlyn Saran, DO           Abnormal Labs Reviewed   CBC W/DIFF - Abnormal; Notable for the  following components:       Result Value    WBC 20.4 (*)     RBC 3.54 (*)     HGB 11.5 (*)     HCT 33.9 (*)     All other components within normal limits   COMPREHENSIVE METABOLIC PANEL - Abnormal; Notable for the following components:    Glucose 182 (*)     BUN 52 (*)     Creatinine 2.08 (*)     Globulin 2.5 (*)     All other components within normal limits    Narrative:     KDIGO 2012 GFR Categories    Stage Description    eGFR (mL/min/1.62m2)    G1  Normal or high    >=90  G2 Mildly decreased   60-89  G3a Mildly to moderately decreased 45-59  G3b Moderately to severely decreased 30-44  G4 Severely decreased   15-29  G5 Kidney Failure    <15   D-DIMER, QUANTITATIVE - Abnormal; Notable for the following components:    D Dimer 313 (*)     All other components within normal limits    Narrative:     CUTOFF FOR EXCLUSION OF DVT/PE = 230 ng/ml(DDU) (see below)  The quantitative D-dimer test, in conjunction with a clinical pretest  probability (PTP) assessment model, can be used to exclude venous  thromboembolism in outpatients suspected of DVT and PE. A positive  D-dimer alone is not diagnostic of DVT. Studies have shown that  D-dimer levels below a certain cutoff value can be used to exclude  DVT and PE in the acute care setting when correlated with the PTP.  This assay expresses D-dimer  results in D-dimer units (D-DU).    Due to the lack of an International Reference Standard some manufacturers   express D-Dimer results in FEU (Fibrinogen Equivalent Units).  The   equivalence between these two measurement units is  approximately 2 ng/ml FEU ~ 1 ng/ml D-DU.    Marylyn Ishihara Hemost 2012; [phone].   KHN MANUAL DIFFERENTIAL - Abnormal; Notable for the following components:    Neutrophils Absolute 14.3 (*)     Lymphocytes Absolute 4.9 (*)     Monocytes Absolute 1.0 (*)     Giant Platelets Present (*)     Acanthocytes 3+ (*)     Anisocytosis 2+ (*)     Poikilocytosis 2+ (*)     Burr Cells 2+ (*)      Schistocytes/Frag/Helmet 1+ (*)     All other components within normal limits         Background  There is no problem list on file for this patient.      Assessment  BP: 110/66 (12/12 2030)  Temp: 98.4 F (36.9 C) (12/12 1912)  Pulse: 68 (12/12 2030)  Resp: 17 (12/12 2030)  SpO2: 93 % (12/12 2030)  FiO2 (%): --  O2 Flow Rate (L/min): --  Cardiac (WDL): --  Cardiac Rhythm: --   Pain Scale: 0     Pain Assessment: Pain Assessment: Verbal  Pain 0-10 Scale: 0    Mental status:  Eye Opening: Spontaneous  Best Verbal Response: Oriented  Best Motor Response: Obeys commands  Glasgow Coma Scale Score: 15    Patient currently on oxygen: no    Active LDAs  Patient Lines/Drains/Airways Status       Active LDAs       Name Placement date Placement time Site Days    Peripheral IV 04/22/23 1940 Right Antecubital 04/22/23  1940  --  less than 1                    Currently Infusing       Order Information Last New Bag Last Action    0.9 % sodium chloride bolus 1,000 mL  New Bag          Rate: 983.6 mL/hr Dose: 1,000 mL   Start: 04/22/23 2100 End: 04/23/23 2100   Ordering Provider: Chase Picket, DO   Peripheral Line: Peripheral IV 04/22/23 1940 Right Antecubital          Rate: 983.6 mL/hr Dose: 1,000 mL   Documented: 04/22/23 2045   User: Wyn Forster  Kerin Perna, RN          Rate: 983.6 mL/hr Dose: 1,000 mL   Documented: 04/22/23 2045   User: Osvaldo Human, [place]                     Fall Risk: 15          Restraints:        Pertinent or High Risk Medications/Drips: no   If Yes, please provide details:  Pending Blood Product Administration: no    Follow-Up Communication needed?  Receiving unit to call sending nurse? yes    First Name:                                                         Ext:                  Recommendation  Next Steps (if known): admit  Plan for Discharge (if known):   Additional Comments: oral chemo    If any further questions, please call Sending RN

## 2023-04-22 NOTE — Other (Signed)
This is a notification of a Admission Alert generated from an ADT received from [employer]. This patient was admited [id number] Admit Date:[id number] Discharge Date: Visit Type:EMERGENCY Diagnosis:

## 2023-04-22 NOTE — ED Provider Notes (Signed)
[facility] Emergency      History Provided By: Patient    CHIEF COMPLAINT  Chief Complaint   Patient presents with    Leg Pain   HPI    Joanna Lopez is a 72 y.o. female who presents with leg pain in bed 10.  Patient presents emergency department secondary to leg pain that she notices most in her left calf when she is walking.  Patient does have a history remotely of lung carcinoma and she is currently on oral chemotherapy.  She also notes that she has had weird taste in her mouth that last for 15 to 20 seconds at a time and have occurred multiple times over the last couple days.  She states she did have 1 of these episodes years ago when she had a TIA.  Denies focal deficits.  Denies any specific inciting or alleviating factors.  No fevers or chills.      REVIEW OF SYSTEMS    Review of system is otherwise negative with the exception of that stated in the HPI.     PAST MEDICAL HISTORY  Past Medical History:   Diagnosis Date    Lung cancer (HCC)        FAMILY HISTORY  Family History   Problem Relation Age of Onset    BRCA 1/2 Maternal Grandmother        SOCIAL HISTORY  Social History     Socioeconomic History    Marital status: Widowed   Tobacco Use    Smoking status: Never    Smokeless tobacco: Never   Vaping Use    Vaping status: Never Used   Substance and Sexual Activity    Alcohol use: Yes     Comment: socially    Drug use: Never     Social Drivers of Health     Food Insecurity: No Transportation Needs (10/21/2022)    Received from [place], [place], [place]    Yearly Questionnaire     Do you need any assistance with obtaining housing, meals, medication, transportation or medical equipment?: No   Transportation Needs: No Transportation Needs (10/21/2022)    Received from [place], [place], [place]    Yearly Questionnaire     Do you need any assistance with obtaining housing, meals, medication, transportation or medical equipment?: No   Housing Stability: No  Transportation Needs (10/21/2022)    Received from [place], [place], [place]    Yearly Questionnaire     Do you need any assistance with obtaining housing, meals, medication, transportation or medical equipment?: No       Tobacco screen is routinely monitored as the part of the patient's emergency department visit and if the patient currently uses tobacco then cessation was discussed with the patient, counseling and resources given.       SURGICAL HISTORY  History reviewed. No pertinent surgical history.    CURRENT MEDICATIONS  Current Facility-Administered Medications   Medication Dose Route Frequency Provider Last Rate Last Admin    0.9 % sodium chloride bolus 1,000 mL  1,000 mL Intravenous ONCE Chase Picket, DO 983.6 mL/hr at 04/22/23 2045 1,000 mL at 04/22/23 2045     Current Outpatient Medications   Medication Sig Dispense Refill    albuterol 90 mcg/actuation inhaler Inhale 2 puffs into the lungs every 4 hours      alectinib ([name]) 150 mg Cap capsule Take 450 mg by mouth in the morning and 450 mg before  bedtime.      aspirin (BAYER ASPIRIN) 325 mg tablet Take 1 tablet (325 mg total) by mouth daily      atorvastatin (LIPITOR) 80 mg tablet Take 1 tablet (80 mg total) by mouth daily      biotin 5,000 mcg Chew Take by mouth      busPIRone (BUSPAR) 10 mg tablet Take 1 tablet (10 mg total) by mouth in the morning and 1 tablet (10 mg total) before bedtime.      cetirizine-psuedoephedrine (ZYRTEC-D) 5-120 mg per 12 hr tablet Take 1 tablet by mouth as needed      cholecalciferol (VITAMIN D3) 25 mcg (1,000 unit) Cap Take 1 tablet by mouth daily      citalopram (CELEXA) 20 mg tablet Take 1 tablet (20 mg total) by mouth daily      dicyclomine (BENTYL) 10 mg capsule Take 1 capsule (10 mg total) by mouth 4 times a day      gabapentin (NEURONTIN) 300 mg capsule Take 1 capsule (300 mg total) by mouth in the morning and 1 capsule (300 mg total) at noon and 1 capsule (300 mg total) in the evening.       levoFLOXacin (LEVAQUIN) 750 mg tablet Take 1 tablet (750 mg total) by mouth daily      lisinopriL (PRINIVIL) 10 mg tablet Take 1 tablet (10 mg total) by mouth daily      multivitamin (THERAGRAN) tablet Take 1 tablet by mouth daily      ondansetron (ZOFRAN) 8 mg tablet Take 1 tablet (8 mg total) by mouth every 8 hours as needed      paroxetine (PAXIL) 20 mg tablet Take 1 tablet (20 mg total) by mouth every morning      rOPINIRole (REQUIP) 1 mg tablet Take 1 tablet (1 mg total) by mouth daily         ALLERGIES  Allergies   Allergen Reactions    Adhesive      Other reaction(s): breaks out and itches if on for long time    Formaldehyde Analogues Swelling     in nail polish and rash and swelling    Nsaids (Non-Steroidal Anti-Inflammatory Drug)      Severe GI issues    Penicillins     Promethazine Hcl      Restless legs    Sulfa (Sulfonamide Antibiotics)     Latex Rash     States she has a sensitivity to latex after being around it for days, she gets a rash: she states that when she wore her cpap machine for 14 days she developed a rash on her face other than this she has no problems stating her dentist wears latex gloves and she has no problems with latex gloves.     States she has a sensitivity to latex after being around it for days, she gets a rash: she states that when she wore her cpap machine for 14 days she developed a rash on her face other than this she has no problems stating her dentist wears latex gloves and she has no problems with latex gloves.       PHYSICAL EXAM   VITAL SIGNS: Patient Vitals for the past 24 hrs:   BP Temp Pulse Resp SpO2 Height Weight   04/22/23 2030 110/66 -- 68 17 93 % -- --   04/22/23 1912 (!) 141/82 98.4 F (36.9 C) 83 16 97 % 5' 6 (1.676 m) 197 lb 12.8 oz (89.7 kg)  Physical Exam  Vitals and nursing note reviewed.   Constitutional:       General: She is not in acute distress.     Appearance: Normal appearance. She is not ill-appearing or toxic-appearing.   HENT:       Head: Normocephalic and atraumatic.      Right Ear: External ear normal.      Left Ear: External ear normal.      Nose: Nose normal.      Mouth/Throat:      Mouth: Mucous membranes are moist.   Cardiovascular:      Rate and Rhythm: Normal rate and regular rhythm.      Pulses: Normal pulses.   Pulmonary:      Effort: Pulmonary effort is normal.   Musculoskeletal:         General: No signs of injury. Normal range of motion.      Cervical back: Neck supple.   Skin:     General: Skin is warm and dry.      Findings: No rash.   Neurological:      Mental Status: She is alert.             RADIOLOGY  Final interpretations of imaging studies are performed by radiologist. Any interpretations performed by myself are preliminary and are ultimately deferred to radiologist for final interpretation.    All incidental radiology findings were discussed with the patient, patient provided instructions to obtain results through MyChart and instructed to discuss incidental findings with their PCP for further outpatient evaluation as indicated.    Last Imaging results   Results for orders placed or performed during the hospital encounter of 04/22/23   CT-HEAD W/O CONTRAST    Narrative    HISTORY: Mental status change.    EXAM : CT OF THE HEAD WITHOUT CONTRAST    TECHNIQUE: Multiple axial images were obtained of the brain without the use of contrast. Individualized dose optimization technique was used in order to meet ALARA standards for radiation dose reduction.  In addition to vendor specific dose reduction algorithms, the dose reduction techniques vary based on the specific scanner utilized but frequently include automated exposure control, adjustment of the mA and/or kV according to patient size, and use of iterative reconstruction technique.    COMPARISON: NONE    FINDINGS:  The visualized paranasal sinuses, mastoid air cells and middle ears are clear to the extent visualized. The orbits and osseous structures are  unremarkable.    Intracranially, there is no mass, midline shift or intracranial hematoma.  The ventricles and cisternal spaces are appropriate.  There are mild periventricular and subcortical white matter hypodensities suggesting small vessel ischemic disease or age related degenerative changes. No focal hypodensity to suggest acute ischemia.      Impression    1.  No acute intracranial abnormality.    Electronically signed by Tedra Coupe, MD   XR-CHEST PORTABLE STAT    Narrative    EXAM: XR-CHEST PORTABLE STAT    INDICATION: chest pain    TECHNIQUE: Portable frontal chest radiographs.    COMPARISON: none     FINDINGS:    HEART / MEDIASTINUM: Normal heart size.  Atherosclerosis.    LUNGS/PLEURA: Lungs are clear. No effusion. No pneumothorax.    BONES / SOFT TISSUES: No acute abnormality.    OTHER: None.      Impression    1. No acute disease.    Electronically signed by Gwenlyn Saran, DO  LABS     CBC W/DIFF - Abnormal; Notable for the following components:       Result Value Ref Range Status    WBC 20.4 (*) 4.0 - 10.5 K/uL Final    RBC 3.54 (*) 3.86 - 5.17 M/uL Final    HGB 11.5 (*) 12.1 - 15.8 g/dL Final    HCT 29.5 (*) 62.1 - 46.5 % Final    All other components within normal limits   COMPREHENSIVE METABOLIC PANEL - Abnormal; Notable for the following components:    Glucose 182 (*) 74 - 109 mg/dL Final    BUN 52 (*) 7 - 25 mg/dL Final    Creatinine 3.08 (*) 0.6 - 1.2 mg/dL Final    Globulin 2.5 (*) 2.6 - 4.2 g/dL Final    All other components within normal limits    Narrative:     KDIGO 2012 GFR Categories                                    Stage Description    eGFR (mL/min/1.22m2)                                    G1 Normal or high    >=90                  G2 Mildly decreased   60-89                  G3a Mildly to moderately decreased 45-59                  G3b Moderately to severely decreased 30-44                  G4 Severely decreased   15-29                  G5 Kidney Failure    <15   D-DIMER,  QUANTITATIVE - Abnormal; Notable for the following components:    D Dimer 313 (*) 0 - 241 ng/mL D-DU Final    All other components within normal limits    Narrative:     CUTOFF FOR EXCLUSION OF DVT/PE = 230 ng/ml(DDU) (see below)                  The quantitative D-dimer test, in conjunction with a clinical pretest                  probability (PTP) assessment model, can be used to exclude venous                  thromboembolism in outpatients suspected of DVT and PE. A positive                  D-dimer alone is not diagnostic of DVT. Studies have shown that                  D-dimer levels below a certain cutoff value can be used to exclude                  DVT and PE in the acute care setting when correlated with the PTP.                  This assay expresses D-dimer  results in  D-dimer units (D-DU).                    Due to the lack of an International Reference Standard some manufacturers                   express D-Dimer results in FEU (Fibrinogen Equivalent Units).  The                   equivalence between these two measurement units is                  approximately 2 ng/ml FEU ~ 1 ng/ml D-DU.                                    Marylyn Ishihara Hemost 2012; [phone].   KHN MANUAL DIFFERENTIAL - Abnormal; Notable for the following components:    Neutrophils Absolute 14.3 (*) 2.0 - 7.3 K/uL Final    Lymphocytes Absolute 4.9 (*) 0.8 - 3.6 K/uL Final    Monocytes Absolute 1.0 (*) 0.3 - 0.9 K/uL Final    Giant Platelets Present (*) Absent /HPF Final    Acanthocytes 3+ (*) 1/3+ /hpf Final    Anisocytosis 2+ (*) 2/3+ /hfp Final    Poikilocytosis 2+ (*) 2/3+ /HPF Final    Burr Cells 2+ (*) 1/3+ /hpf Final    Schistocytes/Frag/Helmet 1+ (*) 1/3+ /hpf Final    All other components within normal limits   HS TROPONIN I - Normal    Narrative:     The Access high sensitivity troponin assay is not intended to be used in isolation; results should be interpreted in conjunction with other diagnostic tests and clinical  information.                                                         HS TROPONIN I - Normal    Narrative:     The Access high sensitivity troponin assay is not intended to be used in isolation; results should be interpreted in conjunction with other diagnostic tests and clinical information.                                                         LACTATE/LACTIC ACID - Normal   PROCALCITONIN - Normal    Narrative:     COMMENT:                  Concentrations of <0.25 ng/mL do not exclude an infection, on account of localized infections (without systemic signs) which can be associated with such low concentrations, or a systemic infection in its initial stages (< 6 hours). Furthermore, increased procalcitonin can occur without infection. Procalcitonin concentrations should be interpreted taking into account the patient's history (see Algorithm Guidelines below). It is recommended to retest procalcitonin within 6 to 12 hours if antibiotics are withheld and an alternate diagnosis has not been established.  FOR ASSISTANCE IN INTERPRETING THESE RESULTS, PLEASE SEE ALGORITHMS BELOW.                     REVIEW BY PATHOLOGY           COURSE & MEDICAL DECISION MAKING    Number and Complexity of Problems Differential Diagnosis: Musculoskeletal pain, DVT, intercranial hemorrhage, mass lesion, CVA, amongst others      MDM Data    My EKG interpretation: Sinus rhythm, normal rate and intervals, left axis deviation    I reviewed previously available EKG records pertaining to this case.     Treatment and Disposition         ED Course: Patient seen and evaluated at bedside.  Vital signs are hemodynamically stable.  EKG without acute changes and serial high-sensitivity troponins are flat and negative at 4.  Heart score at baseline and patient clinically felt to be low risk.  Lactic acid and procalcitonin negative.  White count elevated at 20.4 however patient has been on steroids due to bronchitis and  likely this is the elevated white count.  Low clinical special for bacteremia or sepsis.  CMP shows dehydration and AKI with prerenal azotemia.  Patient given 1 L normal saline fluid bolus.  Age-adjusted D-dimer negative very low clinical sufficient for DVT or PE.  Patient clinically has presentation most consistent with musculoskeletal leg pain.  CT head without acute intracranial abnormalities.  Chest x-ray without acute cardiopulmonary process.  Patient is resting the room on reevaluation.  Neurologic examination is nonfocal nonlateralizing.  Low clinical sufficient for significant mass lesion, CVA or hemorrhage.  Patient has recently had an MRI of the head which did not show any metastatic disease.  Patient offered hospitalization for observation versus discharge with close outpatient follow-up.  She is opted for discharge with close outpatient follow-up.  Struck to follow closely with her primary care provider for reevaluation and also repeat labs to ensure kidney function normalizes with hydration.  Educated on supportive care instructions and strict return precautions.      Medications Administered in the Emergency Dept:     Medications   0.9 % sodium chloride bolus 1,000 mL (1,000 mLs Intravenous New Bag 04/22/23 2045)         Shared decision making: Initial differential diagnoses were discussed with this patient, along with physical exam findings and an explanation what evaluation studies were necessary and why. Labs and Imaging results were explained to the patient in detail, including explanation of what these results mean. All treatment and disposition options were discussed with the patient and a treatment plan with the patient's best short and long term care was made in collaboration with the patient.      Code Status: Not on file    Disposition: Discharged in stable condition    Medications to be Initiated at Time of Discharge:     New Prescriptions    No medications on file       Commonly recognized  adverse reactions to the above medications were discussed along with viable medication alternatives.      FINAL IMPRESSION    ICD-10-CM    1. AKI (acute kidney injury) (HCC)  N17.9       2. Dehydration  E86.0       3. Left leg pain  M79.605           Electronically Signed by Chase Picket, DO, 04/22/2023    This chart has been completed using Dragon  Medical Dictation software, and while attempts have been made to insure accuracy, certain words and phrases may not be transcribed as intended.           Chase Picket, DO  04/22/23 2121

## 2023-04-22 NOTE — ED Notes (Signed)
Pt still unable to void at this time. Jean Rosenthal DO notified

## 2023-04-22 NOTE — ED Notes (Signed)
Pt educated on need for urine sample. Denies urge to void at this time. Will call out when she feels able to go.

## 2023-04-22 NOTE — ED Notes (Signed)
Bed: ER 10  Expected date:   Expected time:   Means of arrival:   Comments:  Ham 23

## 2023-04-22 NOTE — Other (Signed)
This is a notification of a Discharge Alert generated from an ADT received from [employer]. This patient was admited EX:[id number] Hospital Admit Date:[id number] Discharge Date:[id number] Visit Type:EMERGENCY Diagnosis:Acute kidney failure,   unspecified (HCC) Dehydration Pain in left leg Leg Pain

## 2023-04-22 NOTE — ED Triage Notes (Addendum)
Pt to ED via EMS from home. C/o bilateral leg pain since yesterday morning. Currently on oral chemo for lung CA, states she has a weird taste in her mouth.   States she only has pain when she walks.

## 2023-04-27 ENCOUNTER — Ambulatory Visit: Admit: 2023-04-27 | Payer: MEDICARE

## 2023-04-27 ENCOUNTER — Ambulatory Visit: Admit: 2023-04-27 | Discharge: 2023-05-02 | Payer: MEDICARE

## 2023-04-27 DIAGNOSIS — M79604 Pain in right leg: Secondary | ICD-10-CM

## 2023-04-27 DIAGNOSIS — C3412 Malignant neoplasm of upper lobe, left bronchus or lung: Secondary | ICD-10-CM

## 2023-04-27 LAB — DIFFERENTIAL
Bands Absolute: 166 /uL (ref 0–750)
Bands Relative: 1 % (ref 0.0–9.0)
Basophils Absolute: 0 /uL (ref 0–200)
Basophils Relative: 0 % (ref 0.0–1.0)
Eosinophils Absolute: 166 /uL (ref 15–500)
Eosinophils Relative: 1 % (ref 0.0–8.0)
Lymphocytes Absolute: 4482 /uL — ABNORMAL HIGH (ref 850–3900)
Lymphocytes Relative: 27 % (ref 15.0–45.0)
Metamyelocytes Absolute: 498 /uL — ABNORMAL HIGH (ref 0–0)
Metamyelocytes Relative: 3 % — ABNORMAL HIGH (ref 0.0–0.0)
Monocytes Absolute: 996 /uL — ABNORMAL HIGH (ref 200–950)
Monocytes Relative: 6 % (ref 0.0–12.0)
Myelocytes Absolute: 498 /uL — ABNORMAL HIGH (ref 0–0)
Myelocytes Relative: 1 % — ABNORMAL HIGH (ref 0.0–0.0)
Neutrophils Absolute: 10126 /uL — ABNORMAL HIGH (ref 1500–7800)
Neutrophils Relative: 61 % (ref 40.0–80.0)
PLT Morphology: NORMAL

## 2023-04-27 LAB — COMPREHENSIVE METABOLIC PANEL
ALT: 14 U/L (ref 7–52)
AST: 17 U/L (ref 13–39)
Albumin: 3.8 g/dL (ref 3.5–5.7)
Alkaline Phosphatase: 94 U/L (ref 36–125)
Anion Gap: 7 mmol/L (ref 3–16)
BUN: 40 mg/dL — ABNORMAL HIGH (ref 7–25)
CO2: 25 mmol/L (ref 21–33)
Calcium: 9 mg/dL (ref 8.6–10.3)
Chloride: 109 mmol/L (ref 98–110)
Creatinine: 1.37 mg/dL — ABNORMAL HIGH (ref 0.60–1.30)
EGFR: 41
Glucose: 131 mg/dL — ABNORMAL HIGH (ref 70–100)
Osmolality, Calculated: 304 mosm/kg (ref 278–305)
Potassium: 4 mmol/L (ref 3.5–5.3)
Sodium: 141 mmol/L (ref 133–146)
Total Bilirubin: 0.8 mg/dL (ref 0.0–1.5)
Total Protein: 6.4 g/dL (ref 6.4–8.9)

## 2023-04-27 LAB — CBC
Hematocrit: 35.2 % (ref 35.0–45.0)
Hemoglobin: 12 g/dL (ref 11.7–15.5)
MCH: 32.5 pg (ref 27.0–33.0)
MCHC: 33.9 g/dL (ref 32.0–36.0)
MCV: 95.8 fL (ref 80.0–100.0)
MPV: 8.8 fL (ref 7.5–11.5)
Platelets: 209 10*3/uL (ref 140–400)
RBC: 3.68 10*6/uL — ABNORMAL LOW (ref 3.80–5.10)
RDW: 14.9 % (ref 11.0–15.0)
WBC: 16.6 10*3/uL — ABNORMAL HIGH (ref 3.8–10.8)

## 2023-04-27 LAB — MAGNESIUM: Magnesium: 1.4 mg/dL — ABNORMAL LOW (ref 1.5–2.5)

## 2023-04-27 MED ORDER — sodium chloride 0.9 % IV infusion
Freq: Once | INTRAVENOUS
Start: 2023-04-27 — End: 2023-04-27

## 2023-04-27 MED ORDER — sodium chloride 0.9 % IV infusion
Freq: Once | INTRAVENOUS | Status: AC
Start: 2023-04-27 — End: 2023-04-27
  Administered 2023-04-27: 17:00:00 via INTRAVENOUS

## 2023-04-27 NOTE — Telephone Encounter (Signed)
Called patient to follow up. Patient states that on Thursday last week she woke up feeling different. She reports experiencing a metallic taste and smell as well as bilateral leg pain. Pt states the presentation was similar to TIA she had in the past. She called the after hours nurse for her insurance company who directed her to call 911 for urgent evaluation.     Patient was evaluated at [facility] ED to rule out stroke and assess leg pain. ED workup was negative for stroke. D-dimer was elevated at 313 but age adjusted and deemed unlikely to have DVT - leg pain improved today.     She had elevated WBC of 20.4 and Creatinine of 2.08. She received 1L of IVF and was sent home. Pt also had urgent care visit on 04/18/23 and was diagnosed with bronchitis. She received prescriptions for levaquin and prednisone. ED MD believes elevated WBC related to prednisone use.     Discussed with patient that stroke work up was negative. Smell/taste could be affected by respiratory infection. Pt inquiring if symptoms possibly related to alectinib use. Advised pt that unlikely for acute onset symptoms to be related to oral chemo as she has been on this since April. Offered urgent appt today to recheck labs and give IVF. Pt agreeable to this. If needed will have her see urgent NP.

## 2023-04-27 NOTE — Telephone Encounter (Signed)
Pt called stating that she had and episode that is similar to a stroke and she is concerned.   She mentioned that recently started on oral chemo.   She is requesting to speak to someone on the care team for advice.

## 2023-04-27 NOTE — Telephone Encounter (Signed)
Called patient to discuss symptoms. Patient states she is in the waiting room at the eye doctor and requests call back in one hour.

## 2023-04-27 NOTE — Progress Notes (Signed)
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)  Staging form: Lung, AJCC 8th Edition  - Clinical: No stage assigned - Unsigned  - Pathologic stage from 12/11/2016: Stage IA2 (pT1b, pN0, cM0) - Signed by Janett Billow, MD on 12/11/2016    Malignant neoplasm of upper lobe of left lung (CMS-HCC)  Staging form: Lung, AJCC 8th Edition  - Pathologic stage from 12/04/2021: Stage IIB (pT1a, pN1, cM0) - Signed by Janett Billow, MD on 12/04/2021      GENOMICS:  EGFR mt negative  PD-L1 95%  ALK-EML4 fusion positive per ALCHEMIST screening trial testing    SPECIAL MEDICAL ISSUES: 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 11/18/21: Left VATS lingulectomy  2. 01/02/22 - 03/09/22: Adjuvant cisplatin, pemetrexed (C2+ dose reduced due to intolerance - n/v/fatigue)  3. 10/06/22: Adjuvant Alectinib PO (dose reduced to 450mg  BID on 02/05/23 due to nausea, constipation, fatigue)    ECOG PERFORMANCE STATUS: (1) Restricted in physically strenuous activity but ambulatory and able to carry out work of a light or sedentary nature, e.g., light house work, office work      INTERVAL HISTORY:  04/27/23 URGENT VISIT   - Thursday morning woke and felt tightness in bilateral achilles tendons  - denies any rigorous exercise or activity, denies swelling and redness  - she felt weird, watching a movie instead of normal baseline, foggy  - weird taste in mouth and smell that evening and she went to the ER ([facility]) who performed CT head and discharged her claiming it was hair salon stroke b/c she had gone to the hair salon that day  - she  reports she has been feeling better and slowly fog is dissipating  - leg pain is improving too, still present though, she tries exercises/stretching it  - denies nausea, denies issues with eating / drinking  - denies chest pain, baseline shortness of breath  - took steroids / abx for bronchitis, prescribed via urgent care 04/17/23, has been off them since Thursday  - leaves town this weekend for [facility] and does not return til january      MEDICATIONS:  Current Outpatient Medications   Medication Sig    alectinib Take 3 capsules (450 mg) by mouth 2 times a day.    aspirin Take 1 tablet (325 mg total) by mouth daily.    atorvastatin Take 1 tablet (80 mg total) by mouth daily.    biotin Chew by mouth.    busPIRone Take 1 tablet (10 mg total) by mouth in the morning and at bedtime.    calcium carbonate (CALCIUM 300 ORAL) Take 1 tablet by mouth daily.    gabapentin Take 1 capsule (300 mg total) by mouth 3 times a day. (Patient taking differently: Take 1 capsule (300 mg total) by mouth daily.)    lipase/protease/amylase (CREON ORAL) Take 1 capsule by mouth 3 times a day.    lisinopriL Take 1 tablet (10 mg total) by mouth daily. 10 mg (half tablet) daily    MELATONIN  ORAL Take 3-10 mg by mouth at bedtime. PRN for sleep    multivitamin Take 1 tablet by mouth daily.    ondansetron Take 1 tablet (8 mg total) by mouth every 8 hours as needed for Nausea.    PARoxetine Take 1 tablet (20 mg total) by mouth daily.    polyethylene glycol Take 17 g by mouth daily.    roPINIRole Take 1 tablet (1 mg total) by mouth daily.     No current facility-administered medications for this visit.       ALLERGIES:    Penicillins, Adhesive, Formaldehyde analogues, Nsaids (non-steroidal anti-inflammatory drug), Promethazine hcl, Latex, and Sulfa (sulfonamide antibiotics)    REVIEW OF SYSTEMS:  Review of Systems   Constitutional:  Negative for chills and fever.   HENT:  Negative for hearing loss and tinnitus.    Eyes:  Negative for blurred vision  and double vision.   Respiratory:  Negative for cough and hemoptysis.    Cardiovascular:  Negative for chest pain.   Gastrointestinal:  Negative for nausea and vomiting.   Genitourinary:  Negative for dysuria.   Musculoskeletal:  Negative for joint pain.        Pain in bilateral lower extremities   Skin:  Negative for itching and rash.   Neurological:  Negative for dizziness and headaches.   Psychiatric/Behavioral:  Negative for substance abuse and suicidal ideas.        PHYSICAL EXAMINATION:  Vitals:    04/27/23 1313   BP: 92/62   BP Location: Left upper arm   Pulse: 86   Resp: 20   Temp: 98.6 F (37 C)   TempSrc: Temporal   SpO2: 98%   Weight: 195 lb (88.5 kg)        Body surface area is 2.04 meters squared.    Physical Exam  Vitals reviewed.   Constitutional:       Appearance: Normal appearance.   HENT:      Head: Normocephalic.      Nose: Nose normal.   Eyes:      Conjunctiva/sclera: Conjunctivae normal.   Pulmonary:      Effort: Pulmonary effort is normal.   Musculoskeletal:         General: Normal range of motion.      Cervical back: Normal range of motion.   Skin:     General: Skin is warm and dry.   Neurological:      General: No focal deficit present.      Mental Status: She is alert.   Psychiatric:         Mood and Affect: Mood normal.         Behavior: Behavior normal.       LABORATORY DATA:  Lab Results   Component Value Date    WBC 16.6 (H) 04/27/2023    RBC 3.68 (L) 04/27/2023    HGB 12.0 04/27/2023    HCT 35.2 04/27/2023    MCV 95.8 04/27/2023    MCH 32.5 04/27/2023    MCHC 33.9 04/27/2023    RDW 14.9 04/27/2023    PLT 209 04/27/2023        Lab Results   Component Value Date    GLUCOSE 131 (H) 04/27/2023    BUN 40 (H) 04/27/2023    CO2 25 04/27/2023    CREATININE 1.37 (H) 04/27/2023    K 4.0 04/27/2023    NA 141 04/27/2023    CL 109 04/27/2023    CALCIUM 9.0 04/27/2023  ALBUMIN 3.8 04/27/2023    PROT 6.4 04/27/2023    ALKPHOS 94 04/27/2023    ALT 14 04/27/2023    AST 17 04/27/2023    BILITOT 0.8  04/27/2023     No results found for: TSH, T3TOTAL, T4TOTAL, T3FREE, FREET4, THYROIDAB         ASSESSMENT AND PLAN:  1) 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   - Scans show NED still  - Continue current treatment with plan to continue alectinib for 2 years.  - Will check new CT chest for surveillance in 3-6 months. Will check MRI brain every 86mo-1 year given high risk ALK mutation  - RTC in 3-6 months or sooner if needed with scan.     2) Neuropathy NOT ADDRESSED TODAY  - Taking Gabapentin 300 mg po daily - very helpful for neurological pain. Denies neurological pain at this time.     3. Fogginess, LE pain, hypomagnesia  - ER visit to [facility] 04/22/23 to r/o TIA  - overall improved since initiation last Thursday  - continue to exercise / stretch BLE  - fluid replacement today  - plan to RTC Friday to recheck labs and assure recovery      Any elements of the HPI and Assessment/Plan copied from prior notes have been updated where appropriate and all reflect current medical decision making from today, 04/27/2023. Physical examination listed above was completed in entirety today, 04/27/2023.

## 2023-04-27 NOTE — Progress Notes (Signed)
Patient here for urgent add labs/hydration. Labs reviewed and released- 22g PIV inserted to R forearm. Labs drawn and sent to lab for processing. Pt reports 7/10 bilateral calf pain while walking. Pain subsides to a 0/10 when sitting. No reports of swelling or redness.     1150 - Hydration started.     1250 - Hydration complete. Pt to clinic for urgent NP visit.     1330 - Treatment complete, pt tolerated well. PIV flushed and removed. Follow up appt verified. Pt discharged to home in baseline condition. AVS declined.

## 2023-04-28 ENCOUNTER — Ambulatory Visit: Admit: 2023-04-28 | Discharge: 2023-04-28 | Payer: MEDICARE

## 2023-04-28 DIAGNOSIS — H903 Sensorineural hearing loss, bilateral: Secondary | ICD-10-CM

## 2023-04-28 DIAGNOSIS — Z5181 Encounter for therapeutic drug level monitoring: Secondary | ICD-10-CM

## 2023-04-28 NOTE — Progress Notes (Signed)
Joanna Lopez  1951/03/17  [id number]    Ototoxic Monitoring Survey      12/31/2021     4:00 PM 02/13/2022     9:00 AM 04/06/2022    10:00 AM 04/27/2022    11:00 AM 10/27/2022     2:00 PM 04/28/2023    10:00 AM   AMB ENT OTOTOXIC MONITORING   Referral Source oncology - Dr. Levander Campion, MD C. Zerita Boers, MD C. Zerita Boers, MD Zerita Boers, MD Zerita Boers, MD   Test Interval Type Baseline Follow up Follow up Follow up Follow up Follow up   Test interval length  6 weeks after start of treatment 4 weeks since last, 3 mo since start of treatment 3 weeks since last, 16 weeks since start of treatment 6 months since ending treatment 1 year since end of treatment   Hearing loss onset Feels hearing is normal Pt denies change Does not perceive any   Pt denies changes in hearing   Tinnitus? No Bilateral Bilateral Bilateral Bilateral Bilateral   Family members with hearing loss Yes        If yes, who? Mother - later in life; worn hearing aids for 5-10 years        History of noise exposure? No        Any balance concerns- dizziness, vertigo, imbalance? slight since surgery; getting better Nothing new No new None noted None None   Hearing aid user? No    No    Otoscopy? Non-occluding cerumen Non-occluding cerumen WNL WNL WNL WNL   Tympanometry type Type A Au A A A A DNT   Hearing Loss Grading N/A N/A 1 N/A N/A N/A   Tinnitus Grading N/A 1 1 1 1 1    Vertigo Grading N/A N/A N/A N/A N/A N/A       Ototoxic Monitoring Air Conduction Results          04/28/2023    10:00   Air Conduction Results   125 Hz- Right 15   250 Hz- Right 10   500 Hz- Right 10   1000 Hz- Right 5   1500 Hz- Right 10   2000 Hz- Right 20   3000 Hz- Right 25   4000 Hz- Right 20   6000 Hz- Right 30   8000 Hz- Right 55   9000 Hz- Right 65   10 kHz- Right 65   11.2 kHz- Right 60   12.5 kHz- Right 70   14 kHz- Right NR   16 kHz- Right NR   18 kHz- Right NR   20 kHz- Right NR   125 Hz- Left 20   250 Hz- Left 15   500 Hz- Left 20   1000 Hz- Left 15   2000 Hz- Left 15   3000  Hz- Left 20   4000 Hz- Left 10   6000 Hz- Left 15   8000 Hz- Left 45   9000 Hz- Left 55   10 kHz- Left 60   11.2 kHz- Left 70   12.5 kHz- Left 75   14 kHz- Left 80   16 kHz- Left NR   18 kHz- Left NR   20 kHz- Left NR     Determining ototoxic follow up  Grading Scale for change hearing loss:  None-No Change from Baseline     Grading Scale Tinnitus:  Grade 1- Mild Symptoms Intervention not indicated     Grading Scale Vertigo:  N/A    Pt is here today for  ototoxicity audiogram -- 1-year follow-up since ending treatment (Nov 2023).  Pt denies perceived changes in otologic symptoms.  Results grossly stable overall.  Results reviewed with patient.    Requested that [facility] submit recall for 1-year ototoxicity audiogram.  Pt made aware of this, she will notify her medical team sooner if she notices any changes before then.    Haywood Filler, AuD

## 2023-04-30 ENCOUNTER — Ambulatory Visit: Payer: MEDICARE

## 2023-04-30 NOTE — Telephone Encounter (Signed)
Called and spoke with patient.     Verified with patient she is currently taking her chemo pills 3 tabs BID (3 tabs in the morning and 3 tabs at night)     Advised per [place] PA     I discussed with Dr. Zerita Boers, and we were under the impression that she was taking 3 tabs bid. Is this what she is currently doing? If so, the next dose reduction would be to 2 tabs bid, and then stopping all together.     My recommendation is that she holds her pills all together for the holiday time. Then when she resumes, she should cut back to 2 tabs bid (if she is currently taking 3 tabs bid). If she is currently taking 2 tabs bid, then we would recommend she stop all together. If that is the case, we'd like to talk to her in person.     Please let us know    This was all discussed with patient and it was decided that she is going to completely hold her pills and will resume the pills when she returns home from the holidays around 05/14/23.  At that time she will resume her pills 2 tabs BID.    If she decides to stop all together she knows to call us and make an appointment so this can be discussed face to face.      Discussed the missed appointment this morning.  She states she is sorry and was completely unaware that she had an appointment.      She verbalized all understanding. She knows to call us with any further questions or concerns.

## 2023-04-30 NOTE — Telephone Encounter (Signed)
Lem[place]  [facility]    Patient calling nurse triage line seeking advice from team    She states she saw Dr. Zerita Boers last on 04/16/23 and was feeling great at that time.  They decided to decrease her oral chemo, she is now taking 3 pills in the morning and 3 pills in the evening.     States since last Thursday she has been experiencing achilles tendon like pain.  She describes it not as muscle pain/aches but it feels like my achilles tendon is going to snap.  States it makes it very difficult to walk, now has to use a wheelchair when she is out and about.  States pain is the same bilaterally.    Denies any redness/swelling/numbness in her legs.      She also states since last thursday she has had a constant headache.  She states I am not taking anything for it. I would need some heavy medication to help with this pain and I do not want to take it    She also explained to triage RN that last week she felt as if she was having a stoke and called 911.  She was taken to [facility].  Testing was done and was negative.  Since then she states she has seen a PA regarding this event.      She wants to know from team if she can decrease her oral chemo to 1 in the morning and 1 at night in hopes this will help with the issues mentioned above.  She states she does not want to be a patient for her family during the holidays.      Will route message to Dr. Jari Favre teams for advise and guidance.      Patient can be reached at [phone]

## 2023-05-20 NOTE — Telephone Encounter (Signed)
Dr Zerita Boers  [facility]    Pt is calling about a  medication she has been on  alectinib 150 mg and stated she needs to get off it because she is having a really hard time. Pt stated she is having a lot of pain in her joints, muscles, headaches, nausea and diarrhea. She stated she is afraid she will be stuck in a wheel chair and I don't want to do that. She is on gabapentin for the pain, diarrhea is daily, goes about 4 times per day combination of being watery and loose stool, takes imodium.  No fevers and appetite is ok, she has a bad headache daily, usually starts around 4pm. She stated the pain is the worst, makes walking difficult.     Please advise if there is something else she can take.    Her call back # [phone]

## 2023-05-20 NOTE — Telephone Encounter (Signed)
Covering for [place] PA-C:    Returned call to patient - alectinib 150 mg is really impacting her QoL and she doesn't want to take it any more.   D/W Dr Zerita Boers - this is adjuvant treatment and despite dose adjustments etc she hasn't been able to tolerate it. She can stop now, and go into surveillance.   Called patient back - she will stop the drug now. She will keep her appt in May - doesn't need to be seen sooner unless she has new or worsening symptoms.     Kathaleen Maser. [facility] CNP  Nurse Practitioner  Dept Hematology/Oncology

## 2023-07-16 NOTE — Telephone Encounter (Addendum)
 Spoke with pt,pt states she had been to hospital for possible tia pt has been feeling very strange and feels like she is setting herself up for possible tia. Physician advised it will probably be a long wait will put in referral. Returned call to pt advised of Dr.Tanase response. Provided number for neurology

## 2023-07-16 NOTE — Telephone Encounter (Signed)
 Barb asked to obtain referral to Neurology [facility]

## 2023-08-25 ENCOUNTER — Emergency Department: Admit: 2023-08-25 | Payer: MEDICARE

## 2023-08-25 ENCOUNTER — Inpatient Hospital Stay
Admission: EM | Admit: 2023-08-25 | Discharge: 2023-08-26 | Disposition: A | Discharge status: Home / Self Care | Payer: MEDICARE

## 2023-08-25 DIAGNOSIS — R1010 Upper abdominal pain, unspecified: Secondary | ICD-10-CM

## 2023-08-25 DIAGNOSIS — K7689 Other specified diseases of liver: Principal | ICD-10-CM

## 2023-08-25 LAB — DIFFERENTIAL
Basophils Absolute: 78 /uL (ref 0–200)
Basophils Relative: 0.5 % (ref 0.0–1.0)
Eosinophils Absolute: 124 /uL (ref 15–500)
Eosinophils Relative: 0.8 % (ref 0.0–8.0)
Lymphocytes Absolute: 3209 /uL (ref 850–3900)
Lymphocytes Relative: 20.7 % (ref 15.0–45.0)
Monocytes Absolute: 1411 /uL — ABNORMAL HIGH (ref 200–950)
Monocytes Relative: 9.1 % (ref 0.0–12.0)
Neutrophils Absolute: 10680 /uL — ABNORMAL HIGH (ref 1500–7800)
Neutrophils Relative: 68.9 % (ref 40.0–80.0)
nRBC: 0 /100{WBCs} (ref 0–0)

## 2023-08-25 LAB — PROTIME-INR
INR: 1 (ref 0.9–1.1)
Protime: 13.5 s (ref 12.1–15.1)

## 2023-08-25 LAB — URINALYSIS W/RFL TO MICROSCOPIC
Bilirubin, UA: NEGATIVE
Blood, UA: NEGATIVE
Glucose, UA: NEGATIVE mg/dL
Ketones, UA: NEGATIVE mg/dL
Nitrite, UA: NEGATIVE
RBC, UA: 1 /HPF (ref 0–3)
Specific Gravity, UA: 1.027 (ref 1.005–1.035)
Squam Epithel, UA: <1 /HPF (ref 0–5)
Urobilinogen, UA: 2 mg/dL — ABNORMAL HIGH (ref 0.2–1.9)
WBC, UA: 11 /HPF — ABNORMAL HIGH (ref 0–5)
pH, UA: 6 (ref 5.0–8.0)

## 2023-08-25 LAB — MAGNESIUM: Magnesium: 1.6 mg/dL (ref 1.5–2.5)

## 2023-08-25 LAB — BASIC METABOLIC PANEL
Anion Gap: 9 mmol/L (ref 3–16)
BUN: 33 mg/dL — ABNORMAL HIGH (ref 7–25)
CO2: 25 mmol/L (ref 21–33)
Calcium: 9.3 mg/dL (ref 8.6–10.3)
Chloride: 106 mmol/L (ref 98–110)
Creatinine: 1.54 mg/dL — ABNORMAL HIGH (ref 0.60–1.30)
EGFR: 35
Glucose: 135 mg/dL — ABNORMAL HIGH (ref 70–100)
Osmolality, Calculated: 299 mosm/kg (ref 278–305)
Potassium: 4.2 mmol/L (ref 3.5–5.3)
Sodium: 140 mmol/L (ref 133–146)

## 2023-08-25 LAB — HEPATIC FUNCTION PANEL
ALT: 20 U/L (ref 7–52)
AST: 21 U/L (ref 13–39)
Albumin: 4.3 g/dL (ref 3.5–5.7)
Alkaline Phosphatase: 73 U/L (ref 36–125)
Bilirubin, Direct: 0.1 mg/dL (ref 0.0–0.4)
Bilirubin, Indirect: 0.4 mg/dL (ref 0.0–1.1)
Total Bilirubin: 0.5 mg/dL (ref 0.0–1.5)
Total Protein: 7 g/dL (ref 6.4–8.9)

## 2023-08-25 LAB — CBC
Hematocrit: 41 % (ref 35.0–45.0)
Hemoglobin: 13.8 g/dL (ref 11.7–15.5)
MCH: 31.1 pg (ref 27.0–33.0)
MCHC: 33.6 g/dL (ref 32.0–36.0)
MCV: 92.6 fL (ref 80.0–100.0)
MPV: 9.9 fL (ref 7.5–11.5)
Platelets: 194 10*3/uL (ref 140–400)
RBC: 4.43 10*6/uL (ref 3.80–5.10)
RDW: 13.9 % (ref 11.0–15.0)
WBC: 15.5 10*3/uL — ABNORMAL HIGH (ref 3.8–10.8)

## 2023-08-25 LAB — HIGH SENSITIVITY TROPONIN
High Sensitivity Troponin: 4 ng/L (ref 0–14)
High Sensitivity Troponin: 3 ng/L (ref 0–14)

## 2023-08-25 LAB — LIPASE: Lipase: 43 U/L (ref 4–82)

## 2023-08-25 LAB — B NATRIURETIC PEPTIDE: BNP: 14 pg/mL (ref 0–100)

## 2023-08-25 LAB — URINE CULTURE

## 2023-08-25 LAB — DDIMER: D-Dimer: 0.85 ug{FEU}/mL — ABNORMAL HIGH (ref 0.00–0.50)

## 2023-08-25 MED ORDER — ondansetron (ZOFRAN) injection 8 mg
4 | Freq: Once | INTRAMUSCULAR | Status: AC
Start: 2023-08-25 — End: 2023-08-25
  Administered 2023-08-25: 08:00:00 via INTRAVENOUS

## 2023-08-25 MED ORDER — aspirin tablet 325 mg
325 | Freq: Every day | ORAL
Start: 2023-08-25 — End: 2023-08-26

## 2023-08-25 MED ORDER — enoxaparin (LOVENOX) syringe 40 mg/0.4 mL
40 | Freq: Every day | SUBCUTANEOUS
Start: 2023-08-25 — End: 2023-08-26
  Administered 2023-08-26: 12:00:00 via SUBCUTANEOUS

## 2023-08-25 MED ORDER — PARoxetine (PAXIL) tablet 20 mg
20 | Freq: Every evening | ORAL
Start: 2023-08-25 — End: 2023-08-26
  Administered 2023-08-26: 02:00:00 via ORAL

## 2023-08-25 MED ORDER — ondansetron (ZOFRAN) injection 4 mg
4 | Freq: Three times a day (TID) | INTRAMUSCULAR | Status: AC | PRN
Start: 2023-08-25 — End: 2023-08-26

## 2023-08-25 MED ORDER — OMNIPAQUE (iohexol) 350 mg iodine/mL 150 mL
350 | Freq: Once | INTRAVENOUS | Status: AC | PRN
Start: 2023-08-25 — End: 2023-08-25
  Administered 2023-08-25: 09:00:00 via INTRAVENOUS

## 2023-08-25 MED ORDER — multivitamin with folic acid tablet 1 tablet
400 | Freq: Every day | ORAL | Status: AC
Start: 2023-08-25 — End: 2023-08-26
  Administered 2023-08-26: 12:00:00 via ORAL

## 2023-08-25 MED ORDER — busPIRone (BUSPAR) tablet 10 mg
10 | Freq: Two times a day (BID) | ORAL
Start: 2023-08-25 — End: 2023-08-26
  Administered 2023-08-26 (×2): via ORAL

## 2023-08-25 MED ORDER — electrolyte-R (pH 7.4) (NORMOSOL-R pH 7.4) IV bolus 1,000 mL
Freq: Once | INTRAVENOUS | Status: AC
Start: 2023-08-25 — End: 2023-08-25
  Administered 2023-08-25: 08:00:00 via INTRAVENOUS

## 2023-08-25 MED ORDER — roPINIRole (REQUIP) tablet 1 mg
1 | Freq: Every evening | ORAL | Status: AC
Start: 2023-08-25 — End: 2023-08-26
  Administered 2023-08-26: 02:00:00 via ORAL

## 2023-08-25 MED ORDER — atorvastatin (LIPITOR) tablet 80 mg
80 | Freq: Every morning | ORAL | Status: AC
Start: 2023-08-25 — End: 2023-08-26
  Administered 2023-08-26: 12:00:00 via ORAL

## 2023-08-25 MED ORDER — gabapentin (NEURONTIN) capsule 300 mg
300 | Freq: Three times a day (TID) | ORAL
Start: 2023-08-25 — End: 2023-08-26
  Administered 2023-08-25 – 2023-08-26 (×3): via ORAL

## 2023-08-25 MED ORDER — cefTRIAXone (ROCEPHIN) 2 g in sterile water (PF) 20 mL IV Push
Freq: Once | INTRAMUSCULAR | Status: AC
Start: 2023-08-25 — End: 2023-08-25
  Administered 2023-08-25: 12:00:00 via INTRAVENOUS

## 2023-08-25 MED ORDER — acetaminophen (TYLENOL) tablet 650 mg
325 | ORAL | PRN
Start: 2023-08-25 — End: 2023-08-26

## 2023-08-25 MED ORDER — sodium chloride 0.9 % IV infusion
INTRAVENOUS | Status: AC
Start: 2023-08-25 — End: 2023-08-25
  Administered 2023-08-25: 13:00:00 via INTRAVENOUS

## 2023-08-25 MED ORDER — calcium-vitamin D (OSCAL-500 + D) 500 mg-5 mcg (200 unit) per tablet 1 tablet
500 | Freq: Every day | ORAL
Start: 2023-08-25 — End: 2023-08-26
  Administered 2023-08-26: 12:00:00 via ORAL

## 2023-08-25 MED ORDER — metroNIDAZOLE (FLAGYL) in sodium chloride, iso-osm IVPB 500 mg
500 | Freq: Three times a day (TID) | INTRAVENOUS
Start: 2023-08-25 — End: 2023-08-26
  Administered 2023-08-25 – 2023-08-26 (×3): via INTRAVENOUS

## 2023-08-25 MED ORDER — metroNIDAZOLE (FLAGYL) in sodium chloride, iso-osm IVPB 500 mg
500 | Freq: Once | INTRAVENOUS | Status: AC
Start: 2023-08-25 — End: 2023-08-25
  Administered 2023-08-25: 12:00:00 via INTRAVENOUS

## 2023-08-25 MED ORDER — cefTRIAXone (ROCEPHIN) 2 g in sterile water (PF) 20 mL IV Push
2 | INTRAMUSCULAR | Status: AC
Start: 2023-08-25 — End: 2023-08-26
  Administered 2023-08-26: 12:00:00 via INTRAVENOUS

## 2023-08-25 MED ORDER — melatonin tablet Tab 9 mg
3 | Freq: Every evening | ORAL | PRN
Start: 2023-08-25 — End: 2023-08-26

## 2023-08-25 MED ORDER — morphine injection 4 mg
4 | Freq: Once | INTRAVENOUS | Status: AC
Start: 2023-08-25 — End: 2023-08-25
  Administered 2023-08-25: 08:00:00 via INTRAVENOUS

## 2023-08-25 MED ORDER — lisinopriL (PRINIVIL) tablet 10 mg
10 | Freq: Every day | ORAL
Start: 2023-08-25 — End: 2023-08-26

## 2023-08-25 MED FILL — NORMOSOL-R PH 7.4 INTRAVENOUS SOLUTION: 1000.0000 1000.0000 mL | INTRAVENOUS | Qty: 1000

## 2023-08-25 MED FILL — METRONIDAZOLE 500 MG/100 ML IN SODIUM CHLOR(ISO) INTRAVENOUS PIGGYBACK: 500 500 mg/100 mL | INTRAVENOUS | Qty: 100

## 2023-08-25 MED FILL — SODIUM CHLORIDE 0.9 % INTRAVENOUS SOLUTION: 75.0000 75.0000 mL/hr | INTRAVENOUS | Qty: 1000

## 2023-08-25 MED FILL — ONDANSETRON HCL (PF) 4 MG/2 ML INJECTION SOLUTION: 4 4 mg/2 mL | INTRAMUSCULAR | Qty: 4

## 2023-08-25 MED FILL — MORPHINE 4 MG/ML INTRAVENOUS SOLUTION: 4 4 mg/mL | INTRAVENOUS | Qty: 1

## 2023-08-25 MED FILL — CEFTRIAXONE 2 GRAM SOLUTION FOR INJECTION: 2 2 gram | INTRAMUSCULAR | Qty: 1

## 2023-08-25 MED FILL — GABAPENTIN 300 MG CAPSULE: 300 300 MG | ORAL | Qty: 1

## 2023-08-25 NOTE — ED Provider Notes (Signed)
 [place] ED Note    Date of Service: 08/25/2023    Reason for Visit: Abdominal Pain      Patient History     HPI:  This is a 73 y.o. female with history of anxiety, hypertension, hyperlipidemia, prior lung cancer status post chemo presenting with left abdominal pain.  Patient reports left upper quadrant abdominal pain that has been going on intermittently for months and is acutely worse today.  Reports it previously has always gone away on its own.  However this evening at 3-330 it began and has not subsided.  Reports it fluctuates in intensity from 3 out of 10 up to 9 out of 10.  She reports it feels like her abdomen is hard when it happens.  She is unable to better describe it.  She reports it is nonradiating.  It is worse with deep inspiration.  She denies vomiting, dysuria, urgency, frequency, hematuria, constipation, diarrhea, bright red blood per rectum.  She denies fevers or chills.  She does endorse nausea.  She denies history of DVT or PE.  She is not on blood thinners.    Denies exacerbating or alleviating factors except those as noted above in the HPI.     Past Medical History:   Diagnosis Date    Anxiety     Basal cell cancer     Cataract     Hyperlipidemia     Hypertension     Lung nodule     OSA (obstructive sleep apnea)     RLS (restless legs syndrome)     Stroke (CMS-HCC)     5 yrs ago, TIA X 2       Past Surgical History:   Procedure Laterality Date    APPENDECTOMY      CATARACT EXTRACTION, BILATERAL Bilateral 07/2021    COLONOSCOPY N/A 01/23/2013    Procedure: COLONOSCOPY WITH MAC;  Surgeon: Lurene Shadow, MD;  Location: [facility] ENDOSCOPY;  Service: Gastroenterology;  Laterality: N/A;    FLEXIBLE BRONCHOSCOPY W/ UPPER ENDOSCOPY N/A 09/17/2016    Procedure: BRONCHOSCOPY SUPER D WITH FLUORO AND BIOPSY-EBUS/NAVI;  Surgeon: Johnny Bridge, MD;  Location: [facility] ENDOSCOPY;  Service: Pulmonary;  Laterality: N/A;    INSERTION CATHETER VASCULAR ACCESS N/A  12/26/2021    Procedure: INSERTION POWER PORT A CATH;  Surgeon: Penni Bombard, MD;  Location: [facility] OR;  Service: General;  Laterality: N/A;    LOOP IMPLANT IMPLANTATION N/A 04/07/2017    Procedure: Loop  Implantation;  Surgeon: Tennis Must, MD;  Location: [facility] ELECTROPHYSIOLOGY LAB;  Service: Electrophysiology;  Laterality: N/A;    LOOP REMOVAL Left 04/09/2021    Procedure: Loop Removal;  Surgeon: Lawerance Cruel, MD;  Location: [facility] MIS SUITE;  Service: Electrophysiology;  Laterality: Left;    submastoid gland removal      THORACOSCOPY Right 11/23/2016    Procedure: RIGHT SIDED VATS MIDDLE LOBE LOBECTOMY,HILAR MEDIASTINAL LYMPH NODE DISSECTION;  Surgeon: Janett Billow, MD;  Location: [facility] OR;  Service: Thoracic;  Laterality: Right;    THORACOSCOPY LOBECTOMY VIDEO ASSISTED Left 11/18/2021    Procedure: Left Thoracoscopic Lingulectomy;  Surgeon: Janett Billow, MD;  Location: [facility] OR;  Service: General;  Laterality: Left;    TONSILLECTOMY      UVULOPALATOPHARYNGOPLASTY         [name]  reports that she has never smoked. She has never used smokeless tobacco. She reports current alcohol use of about 2.0 standard drinks of alcohol per week. She reports that she does not use drugs.  Patient's Medications   New Prescriptions    No medications on file   Previous Medications    ASPIRIN 325 MG TABLET    Take 1 tablet (325 mg total) by mouth daily.    ATORVASTATIN (LIPITOR) 80 MG TABLET    Take 1 tablet (80 mg total) by mouth daily.    BIOTIN 5,000 MCG CHEW    Chew by mouth.    BUSPIRONE (BUSPAR) 10 MG TABLET    Take 1 tablet (10 mg total) by mouth in the morning and at bedtime.    CALCIUM CARBONATE (CALCIUM 300 ORAL)    Take 1 tablet by mouth daily.    GABAPENTIN (NEURONTIN) 300 MG CAPSULE    Take 1 capsule (300 mg total) by mouth 3 times a day.    LIPASE/PROTEASE/AMYLASE (CREON ORAL)    Take 1 capsule by mouth 3 times a day.    LISINOPRIL (PRINIVIL) 10 MG TABLET    Take 1 tablet (10 mg total) by mouth daily.  10 mg (half tablet) daily    MELATONIN ORAL    Take 3-10 mg by mouth at bedtime. PRN for sleep    MULTIVITAMIN (THERAGRAN) TABLET    Take 1 tablet by mouth daily.    ONDANSETRON (ZOFRAN) 8 MG TABLET    Take 1 tablet (8 mg total) by mouth every 8 hours as needed for Nausea.    PAROXETINE (PAXIL) 20 MG TABLET    Take 1 tablet (20 mg total) by mouth daily.    POLYETHYLENE GLYCOL (MIRALAX) 17 GRAM/DOSE POWDER    Take 17 g by mouth daily.    ROPINIROLE (REQUIP) 1 MG TABLET    Take 1 tablet (1 mg total) by mouth daily.   Modified Medications    No medications on file   Discontinued Medications    No medications on file       Allergies:   Allergies as of 08/25/2023 - Fully Reviewed 08/25/2023   Allergen Reaction Noted    Penicillins Anaphylaxis 07/21/2012    Adhesive  06/23/2021    Formaldehyde analogues  07/21/2012    Nsaids (non-steroidal anti-inflammatory drug)  11/10/2021    Promethazine hcl  04/10/2017    Latex Rash 12/03/2011    Sulfa (sulfonamide antibiotics) Rash 07/21/2012       PMH: Nursing notes reviewed   PSH: Nursing notes reviewed   FH: Nursing notes reviewed   MEDS: Nursing notes and chart reviewed     Review of Systems     ROS:   Review of Systems   Constitutional:  Negative for chills and fever.   HENT:  Negative for sore throat.    Eyes:  Negative for redness.   Respiratory:  Negative for cough, sputum production and shortness of breath.    Cardiovascular:  Negative for chest pain and leg swelling.   Gastrointestinal:  Positive for abdominal pain and nausea. Negative for blood in stool, constipation, diarrhea and vomiting.   Genitourinary:  Negative for dysuria and hematuria.   Musculoskeletal: Negative.    Skin:  Negative for rash.   Neurological:  Negative for dizziness, sensory change, focal weakness, loss of consciousness and headaches.   Psychiatric/Behavioral:  Negative for substance abuse and suicidal ideas.    All other systems reviewed and are negative.        Physical Exam     ED Triage Vitals     Vital Signs Group      Temp 97.7 F (36.5 C)  Core (Body) Temperature       Temp Source Oral      Heart Rate 104      Heart Rate Source       Resp 18      SpO2 98 %      BP 117/84      MAP (mmHg) 91      BP Method Automatic      BP Location Right upper arm      BP Cuff Size Regular      Patient Position Sitting   SpO2 98 %   O2 Device None (Room air)       General: well nourished; well developed; in no apparent distress   HEENT:  normocephalic, atraumatic; EOMI; moist mucous membrane  Neck:  Neck supple, trachea midline, no meningismus  Pulmonary:  No respiratory distress, easy work of breathing, equal chest rise bilaterally, Lung sounds clear to auscultation bilaterally with good air entry; no wheezes or rales, + inferolateral chest wall TTP  Cardiac:  Normal rate, regular rhythm, normal S1 and S2, no murmurs, rubs, or gallops  Abdomen:  Soft; + LUQ TTP, non-distended; no rebound or guarding  Musculoskeletal:  Atraumatic exam with no focal swelling or tenderness, no peripheral edema   Vascular:  2+ peripheral pulses in bilateral upper and lower extremities   Skin:  Warm and well perfused without rashes or lesions   Neuro:  Alert and oriented x 3, CN II-XII intact, 5/5 strength and sensation grossly intact to light touch in bilateral upper and lower extremities, ambulatory with normal gait  Psych:  appropriate mood and affect, no SI/HI      Diagnostic Studies     Labs:  Please see Epic for full details    Radiology:  Please see Epic for full details     EKG:  Time of exam: 0428  Rhythm: NSR  Rate: 67 bpm  Axis: normal  Ectopy: None  Conduction: Normal intervals  ST Segments: no acute change  T Waves: no acute change  Q Waves: none    Clinical Impression: Normal sinus rhythm at a rate of 67 bpm, no acute ischemic changes, normal intervals    ED Course     ED Course as of 08/25/23 0623   Wed Aug 25, 2023   [id number] 73 year old female patient presenting with left upper quadrant/left inferolateral chest wall pain.    0410 Heart Rate: 104   0433 Urinalysis w/Rfl to Microscopic(!):    Color, UA Straw   Clarity, UA Clear   Specific Gravity, UA 1.027   pH, UA 6.0   Protein, UA Trace(!)   Glucose, UA Negative   Ketones, UA Negative   Bilirubin, UA Negative   Blood, UA Negative   Nitrite, UA Negative   Urobilinogen, UA 2.0(!)   Leukocyte Esterase, UA Small(!)   RBC, UA 1   WBC, UA 11(!)   Squam Epithel, UA <1   0433 ECG for indication of dysrhythmia  NSR at a rate of 67 bpm, no acute ischemic changes    0441 CBC(!):    WBC 15.5(!)   RBC 4.43   Hemoglobin 13.8   Hematocrit 41.0   MCV 92.6   MCH 31.1   MCHC 33.6   RDW 13.9   Platelet Count 194   MPV 9.9   0441 Differential(!):    Neutrophils Relative 68.9   Lymphocytes Relative 20.7   Monocytes Relative 9.1   Eosinophils Relative 0.8   Basophils Relative 0.5  nRBC 0   Neutrophils Absolute 10,680(!)   Absolute Lymphocytes 3,209   Monocytes Absolute 1,411(!)   Eosinophils Absolute 124   Basophils Absolute 78   0450 Hepatic Function Panel:    Bili, Total 0.5   Bilirubin, Direct 0.1   AST (SGOT) 21   ALT (SGPT) 20   Alkaline Phosphatase 73   Protein, Total 7.0   Albumin 4.3   Bilirubin, Indirect 0.4   0450 Magnesium:    Magnesium 1.6   0450 Lipase:    Lipase 43   0450 Basic metabolic panel(!):    Sodium 140   Potassium 4.2   Chloride 106   Carbon Dioxide (CO2) 25   Anion Gap 9   BUN 33(!)   Creatinine 1.54(!)   Glucose 135(!)   Calcium 9.3   Calculated Osmolality, Serum 299   EGFR 35   0453 D Dimer(!):    D-Dimer 0.85(!)   0504 Protime-INR:    Protime 13.5   INR 1.0   0513 B Natriuretic Peptide:    BNP 14   0515 High Sensitivity Troponin ( ):    High Sensitivity Troponin 4   0515 X-ray Chest PA and Lateral  IMPRESSION:   No acute cardiopulmonary abnormality.        ROENA SASSAMAN is a 73 y.o. female with a history and presentation as described above in HPI.      Vitals reviewed.     IV access was established.  Medications administered:   Medications   morphine injection 4 mg  (4 mg Intravenous Given 08/25/23 0415)   ondansetron (ZOFRAN) injection 8 mg (8 mg Intravenous Given 08/25/23 0415)   electrolyte-R (pH 7.4) (NORMOSOL-R pH 7.4) IV bolus 1,000 mL (0 mLs Intravenous Stopped 08/25/23 0614)   OMNIPAQUE (iohexol) 350 mg iodine/mL 150 mL (150 mLs Intravenous Given 08/25/23 0525)         MDM Documentation     Medical Decision Making  In brief, this is a 73 year old female patient presenting with left upper quadrant/left inferolateral chest wall pain.  Her EKG is nonischemic.  She is endorsing abdominal pain.  However she is indicating her left inferolateral chest wall.  She reports it is worse with deep inspiration.  She does not have any nausea, vomiting, urinary symptoms, or stool changes.  Her dimer was slightly elevated at 0.85.  Therefore we did get CT PE and abdomen pelvis.  These are still pending at this time.  Patient signed out to my colleague pending CTs, reassess, final disposition.  Of note after the patient received morphine she did require a small amount of supplemental O2 via nasal cannula.  Is unclear if this is her underlying disease process or related to receiving the pain medication.    Problems Addressed:  Left upper quadrant abdominal pain: complicated acute illness or injury    Amount and/or Complexity of Data Reviewed  External Data Reviewed: notes.  Labs: ordered. Decision-making details documented in ED Course.  Radiology: ordered. Decision-making details documented in ED Course.  ECG/medicine tests: ordered. Decision-making details documented in ED Course.    Risk  Prescription drug management.        Impression     1. Left upper quadrant abdominal pain         Plan     Disposition: pending     At this time I am going off-service and will be signing out care of this patient to my colleague Dr. Jobie Quaker for further care. My colleague's responsibilities will  include: follow-up CTs, reassess, final disposition        Medication List        ASK your doctor about these  medications        Quantity/Refills   aspirin 325 MG tablet  Take 1 tablet (325 mg total) by mouth daily.   Quantity: 30 tablet  Refills: 0     atorvastatin 80 MG tablet  Commonly known as: LIPITOR  Take 1 tablet (80 mg total) by mouth daily.   Refills: 0     biotin 5,000 mcg Chew  Chew by mouth.   Refills: 0     busPIRone 10 MG tablet  Commonly known as: BUSPAR  Take 1 tablet (10 mg total) by mouth in the morning and at bedtime.   Refills: 0     CALCIUM 300 ORAL  Take 1 tablet by mouth daily.   Refills: 0     CREON ORAL  Take 1 capsule by mouth 3 times a day.   Refills: 0     gabapentin 300 MG capsule  Commonly known as: NEURONTIN  Take 1 capsule (300 mg total) by mouth 3 times a day.   Quantity: 90 capsule  Refills: 1     lisinopriL 10 MG tablet  Commonly known as: PRINIVIL  Take 1 tablet (10 mg total) by mouth daily. 10 mg (half tablet) daily   Refills: 0     MELATONIN ORAL  Take 3-10 mg by mouth at bedtime. PRN for sleep   Refills: 0     multivitamin tablet  Commonly known as: THERAGRAN  Take 1 tablet by mouth daily.   Refills: 0     ondansetron 8 MG tablet  Commonly known as: ZOFRAN  Take 1 tablet (8 mg total) by mouth every 8 hours as needed for Nausea.   Quantity: 30 tablet  Refills: 3     PARoxetine 20 MG tablet  Commonly known as: PAXIL  Take 1 tablet (20 mg total) by mouth daily.   Refills: 0     polyethylene glycol 17 gram/dose powder  Commonly known as: Miralax  Take 17 g by mouth daily.   Quantity: 238 g  Refills: 0     roPINIRole 1 MG tablet  Commonly known as: REQUIP  Take 1 tablet (1 mg total) by mouth daily.   Refills: 0            No follow-ups on file.  Future Appointments   Date Time Provider Department Center   09/13/2023  9:20 AM [facility] 2 [facility] CT [facility]   09/17/2023 10:30 AM Terral Ferrari, MD [facility]   10/26/2023  3:00 PM Arron Large, MD [facility]       Please see Epic for full details of discharge instructions.      Brendolyn Callas, MD, MPH  [facility]    Portions of this note were generated using voice recognition software.  Every effort was made to ensure the accuracy of transcription, however some discrepancies may remain.             Laree Platts, MD  08/25/23 [phone]

## 2023-08-25 NOTE — H&P (Signed)
 History and Physical      Name:  Joanna Lopez DOB/Age/Sex: Jan 27, 1951  (73 y.o. female)   MRN & CSN:  [id number] & [id number] Encounter Date/Time: 08/25/2023 @NOWNR @   Location:  A04/A04W PCP: [name], MD       Hospital Day: 1      History of Present Illness:     Chief Complaint: abd pain    Joanna Lopez is a 73 y.o. female with PMH of lung cancer (sp VATS, chemo), HTN,  who presents with worsening LUQ abd pain over the last 6 weeks. Patient states that pain was intermittent at first associated with nausea but no vomiting.  She also reports shortness of breath when she is in severe pain.  Patient has been becoming more constant for the past 12 hours. Last BM 2 days ago.     At ED, pt was afebrile, hemodynamically stable; on room air. Labs remarkable for CR 1.54, WBC 15.5. had CTPA, CT abd w contrast which showed Mild inflammatory changes adjacent to a multilobulated septated left hepatic cystic lesion; nonspecific and may represent infection or inflammation of the cystic lesion versus recent rupture. . GI consulted who recommended abx. Discussed with ED, will admit pt to med surg  for suspected ruptured hepatic cyst  workup and management.       Assessment and Plan:     Hepatic cystic lesions . Concern for ruptured cyst.  Ongoing LUQ abd pain past few weeks. Has leukocytosis WBC 15.5  GI consulted who recommended IR evaluation for probable drainage  C/w rocephin, flagyl  Pain control if needed    AKI on CKD III.    Cr 1.54 (was 1.21 07/2023)  Suspect from dehydration  Received  IVF  - avoid nephrotoxins   - monitor renal panel  - I/o    Lung cancer  Follows hem/onc outpatient    Essential HTN  On home lisinopril  - holding given AKI    Code Status: Code status was discussed in detail with Patient/POA; verbalized understanding of the different types of code status; Patient/POA  opted for FULL CODE     Medical Decision Making:     A. Problems (any 1)  [x]  Acute/Chronic Illness/injury posing  ongoing threat to life and/or bodily function without ongoing treatment    []  Severe exacerbation of chronic illness    --------------------------------------------------  B. Risk of Treatment (any 1)    [x]  Drugs/treatments that require intensive monitoring for toxicity    [x]  IV ABX (Vancomycin, Aminoglycosides, etc)     []  Post-Cath/Contrast study requiring serial monitoring    []  IV Narcotic analgesia    []  Aggressive IV diuresis    []  Hypertonic Saline    []  Critical electrolyte abnormalities requiring IV replacement    []  Insulin - Scheduled/SSI or Insulin gtt    []  Anticoagulation (Heparin gtt or Coumadin - other anticoagulants in special circumstances)    []  Cardiac Medications (IV Amiodarone/Diltiazem, Tikosyn, etc)    []  Hemodialysis    []  Other -    []  Change in code status    []  Decision to escalate care    []  Major surgery/procedure with associated risk factors    --------------------------------------------------  C. Data (any 2)    [x]  Data Review (any 3)    [x]  Consultant notes from yesterday/today    [x]  All available current labs reviewed interpreted for clinical significance    [x]  Appropriate follow-up labs were ordered  [x]  Collateral history obtained     [  x] Independent Interpretation of tests (any 1)    []  Telemetry (Rhythm Strip) personally reviewed and interpreted        [x]  Imaging personally reviewed and interpreted     []  Discussion (any 1)  []  Multi-Disciplinary Rounds with Case Management  []  Discussed management of the case with      The billing in this case is level 3 due to the combination of above and specifically because of complexity of the case.        Estimated time spent for medical decision-making encompassing complexity of the case, history taking, medication review, physical examination, communication with family if available,  discussion with specialists and ancillary staff members if needed, to provide accurate care for the patient was around 30 minutes.    Disposition:    Current Living situation: home  Expected Disposition: home  Estimated D/C: 1-2 days    Diet Orders Placed This Encounter   Procedures    Diet NPO Except for: except meds and ice      DVT Prophylaxis [x]  Lovenox, []   Heparin, []  SCDs, []  Ambulation,  []  Eliquis, []  Xarelto   Code Status Full Code   Surrogate Decision Maker/ POA      History from:          Review of Systems: Need 10 Elements   See above           Objective:     Intake/Output Summary (Last 24 hours) at 08/25/2023 0750  Last data filed at 08/25/2023 6644  Gross per 24 hour   Intake 1000 ml   Output --   Net 1000 ml      Vitals:   Vitals:    08/25/23 0459 08/25/23 0640 08/25/23 0706 08/25/23 0734   BP: 120/71 117/72 99/77 100/71   BP Location:       Patient Position:       BP Cuff Size:       Pulse:  65 64 79   Resp: 18 15 14 18    Temp:       TempSrc:       SpO2: 91% 96% 95% 94%   Weight:       Height:           Medications Prior to Admission     Home Medications    Medication Sig Taking? Last Dose   aspirin 325 MG tablet Take 1 tablet (325 mg total) by mouth daily.     atorvastatin (LIPITOR) 80 MG tablet Take 1 tablet (80 mg total) by mouth daily.     biotin 5,000 mcg Chew Chew by mouth.     busPIRone (BUSPAR) 10 MG tablet Take 1 tablet (10 mg total) by mouth in the morning and at bedtime.     calcium carbonate (CALCIUM 300 ORAL) Take 1 tablet by mouth daily.     gabapentin (NEURONTIN) 300 MG capsule Take 1 capsule (300 mg total) by mouth 3 times a day.  Patient taking differently: Take 1 capsule (300 mg total) by mouth daily.     lipase/protease/amylase (CREON ORAL) Take 1 capsule by mouth 3 times a day.     lisinopriL (PRINIVIL) 10 MG tablet Take 1 tablet (10 mg total) by mouth daily. 10 mg (half tablet) daily     MELATONIN ORAL Take 3-10 mg by mouth at bedtime. PRN for sleep     multivitamin (THERAGRAN) tablet Take 1 tablet by mouth daily.     ondansetron (ZOFRAN) 8 MG tablet Take  1 tablet (8 mg total) by mouth every 8 hours as needed for Nausea.      PARoxetine (PAXIL) 20 MG tablet Take 1 tablet (20 mg total) by mouth daily.     polyethylene glycol (MIRALAX) 17 gram/dose powder Take 17 g by mouth daily.     roPINIRole (REQUIP) 1 MG tablet Take 1 tablet (1 mg total) by mouth daily.         Physical Exam: Need 8 Elements   Physical Exam     General: NAD  Eyes: EOMI  ENT: neck supple  Cardiovascular: Regular rate.  Respiratory: Clear to auscultation  Gastrointestinal: Soft, epigastric tenderness  Genitourinary: no suprapubic tenderness  Musculoskeletal: No edema  Skin: warm, dry  Neuro: Alert.  Psych: Mood appropriate.       Past Medical History:   PMHx   Past Medical History:   Diagnosis Date    Anxiety     Basal cell cancer     Cataract     Hyperlipidemia     Hypertension     Lung nodule     OSA (obstructive sleep apnea)     RLS (restless legs syndrome)     Stroke (CMS-HCC)     5 yrs ago, TIA X 2     PSHX:  has a past surgical history that includes Appendectomy; submastoid gland removal; Uvulopalatopharyngoplasty; Tonsillectomy; Colonoscopy (N/A, 01/23/2013); Flexible bronchoscopy w/ upper endoscopy (N/A, 09/17/2016); Thoracoscopy (Right, 11/23/2016); Loop Implant  Implantation (N/A, 04/07/2017); Loop Removal (Left, 04/09/2021); Cataract extraction, bilateral (Bilateral, 07/2021); thoracoscopy lobectomy video assisted (Left, 11/18/2021); and insertion catheter vascular access (N/A, 12/26/2021).  Allergies: Allergies[1]  Fam HX:  family history includes Diabetes in her son. She was adopted.  Soc HX:   Social History     Socioeconomic History    Marital status: Widowed     Spouse name: None    Number of children: None    Years of education: None    Highest education level: None   Occupational History    None   Tobacco Use    Smoking status: Never    Smokeless tobacco: Never   Vaping Use    Vaping status: Never Used   Substance and Sexual Activity    Alcohol use: Yes     Alcohol/week: 2.0 standard drinks of alcohol     Types: 2 Glasses of wine per week     Comment:  occasionally    Drug use: No    Sexual activity: Not Currently     Partners: Male   Other Topics Concern    Caffeine Use No     Comment: 1 can coke/day    Occupational Exposure Yes     Comment: Geographical information systems officer    Exercise No    Seat Belt Yes   Social History Narrative    None     Social Drivers of Health     Financial Resource Strain: Not on file   Food Insecurity: No Food Insecurity (10/21/2022)    Yearly Questionnaire     Do you need any assistance with obtaining housing, meals, medication, transportation or medical equipment?: No     Assistance needed for:: Not on file   Transportation Needs: No Transportation Needs (10/21/2022)    Yearly Questionnaire     Do you need any assistance with obtaining housing, meals, medication, transportation or medical equipment?: No     Assistance needed for:: Not on file   Physical Activity: Not on file   Stress: Not  on file   Social Connections: Not on file   Intimate Partner Violence: Not on file   Housing Stability: Low Risk  (10/21/2022)    Yearly Questionnaire     Do you need any assistance with obtaining housing, meals, medication, transportation or medical equipment?: No     Assistance needed for:: Not on file       Medications:   Medications:    cefTRIAXone (ROCEPHIN) IVPB  2 g Intravenous Once    [START ON 08/26/2023] cefTRIAXone (ROCEPHIN) IVPB  2 g Intravenous Q24H    [START ON 08/26/2023] enoxaparin  40 mg Subcutaneous Daily 0900    metroNIDAZOLE (FLAGYL) in sodium chloride, iso-osm  500 mg Intravenous Once    metroNIDAZOLE (iso-osm)  500 mg Intravenous Q8H      Infusions:    sodium chloride 0.9 %       PRN Meds: @MEDPRNMEDS @    Labs      CBC:   Recent Labs     08/25/23  0412   WBC 15.5*   HGB 13.8   PLT 194     BMP:    Recent Labs     08/25/23  0412   NA 140   K 4.2   CL 106   CO2 25   BUN 33*   CREATININE 1.54*   GLUCOSE 135*     Hepatic:   Recent Labs     08/25/23  0412   AST 21   ALT 20   BILITOT 0.5   ALKPHOS 73     Lipids:   Lab Results   Component Value  Date    HDL 41 (L) 02/22/2017    TRIG 249 (H) 02/22/2017     Hemoglobin A1C: No results found for: LABA1C  TSH: No results found for: TSH  Troponin: No results found for: TROPONINT  Lactic Acid: No results for input(s): LACTA in the last 72 hours.  BNP: Invalid input(s): PROBNP  UA:  Lab Results   Component Value Date    COLORU Straw 08/25/2023    PHUR 6.0 08/25/2023    WBCUA 11 (H) 08/25/2023    RBCUA 1 08/25/2023    MUCUS Present (A) 11/25/2016    BACTERIA Occasional (A) 12/29/2016    CLARITYU Clear 08/25/2023    LEUKOCYTESUR Small (A) 08/25/2023    UROBILINOGEN 2.0 (H) 08/25/2023    BILIRUBINUR Negative 08/25/2023    BLOODU Negative 08/25/2023    GLUCOSEU Negative 08/25/2023     Urine Cultures: No results found for: LABURIN  Blood Cultures: No components found for: BC  No components found for: BLOODCULT2  Organism: No components found for: ORG    Imaging/Diagnostics Last 24 Hours     CT Pulmonary Angiography   Final Result   IMPRESSION:      CHEST CTA   No acute pulmonary embolism.         ABDOMEN AND PELVIS   Mild inflammatory changes adjacent to a multilobulated septated left hepatic cystic lesion; nonspecific and may represent infection or inflammation of the cystic lesion versus recent rupture.         Approved by Ian Malkin, MD on 08/25/2023 6:03 AM EDT      I have personally reviewed the images and I agree with this report.      Report Verified by: Micheal Likens, DO at 08/25/2023 6:29 AM EDT      CT Abdomen and Pelvis With IV contrast   Final Result   IMPRESSION:  CHEST CTA   No acute pulmonary embolism.         ABDOMEN AND PELVIS   Mild inflammatory changes adjacent to a multilobulated septated left hepatic cystic lesion; nonspecific and may represent infection or inflammation of the cystic lesion versus recent rupture.         Approved by Lupie Salle, MD on 08/25/2023 6:03 AM EDT      I have personally reviewed the images and I agree with this report.      Report Verified by:  Ellis Guys, DO at 08/25/2023 6:29 AM EDT      X-ray Chest PA and Lateral   Final Result   IMPRESSION:       No acute cardiopulmonary abnormality.      Approved by Lupie Salle, MD on 08/25/2023 4:46 AM EDT      I have personally reviewed the images and I agree with this report.      Report Verified by: Ellis Guys, DO at 08/25/2023 5:11 AM EDT          --------------------------------------------------------------------------------------------------------------------  Extended Emergency Contact Information  Primary Emergency Contact: Straw,Alace   [place]  Mobile Phone: [phone]  Relation: Daughter  Preferred language: English  Interpreter needed? No  Secondary Emergency Contact: Vanschaick,David   [place] [employer] Phone: [phone]  Relation: Son  Preferred language: English  Interpreter needed? No    Jalesha Plotz, MD         [1]   Allergies  Allergen Reactions    Penicillins Anaphylaxis     Facial Swelling  immediate rash, facial/tongue/throat swelling, SOB or lightheadedness with hypotension: Yes  severe rash involving mucus membranes or skin necrosis: Yes  Has patient had a PCN reaction that required hospitalization: No  Has patient had a PCN reaction occurring within the last 10 years: No  If all of the above answers are NO, then may proceed with Cephalosporin use.    Adhesive      Other reaction(s): breaks out and itches if on for long time    Formaldehyde Analogues     Nsaids (Non-Steroidal Anti-Inflammatory Drug)      Severe GI issues    Promethazine Hcl      Restless legs    Latex Rash     States she has a sensitivity to latex after being around it for days, she gets a rash: she states that when she wore her cpap machine for 14 days she developed a rash on her face other than this she has no problems stating her dentist wears latex gloves and she has no problems with latex gloves.     Sulfa (Sulfonamide Antibiotics) Rash

## 2023-08-25 NOTE — Nursing Note (Signed)
 Patient arrived to unit, room 505. A/O x4. VSS, no c/o pain or discomfort voiced. Hydration provided and oriented to room. Safety measures in place per protocol, call light in reach. RN to continue POC.

## 2023-08-25 NOTE — Nursing Note (Signed)
 Patient placed on 2L while napping in room. Patient's O2 dropped to 88% on RA with eyes closed. Tolerated well. Patient doesn't wear home O2 or CPAP.

## 2023-08-25 NOTE — ED Triage Notes (Signed)
 Pt states that she is having left sided abdominal pain that started yesterday afternoon and has gotten progressively worse.

## 2023-08-25 NOTE — ED Triage Notes (Signed)
 To ED with c/o LUQ abdominal pain. Reports pain present intermittently for a few months. States it present again yesterday around 1530. Worse when taking deep breath in. Endorses nausea, denies vomiting and diarrhea. Denies known fevers.

## 2023-08-25 NOTE — Nursing Note (Incomplete)
 Patient to unit, room 505.

## 2023-08-25 NOTE — Progress Notes (Signed)
 Medication Reconciliation  [facility]    Patient Name:   Joanna Lopez 10-09-1950    Medication reconciliation has been completed by:   Pharmacist    Source(s) of information:    Surescripts dispense history reviewed  Interview with patient - able to list medications from memory  Confirmed fill history with home pharmacy representative - [facility] (clarified only Folinic Plus)    -Family was present during interview     Primary Care Physician:   [name], MD    Pharmacy:     [facility] [id number] - [address] [place] - [address] [facility] [address]  [address]  [place] [address]  Phone: [phone]     [facility] - [place], [place] - [address]  [address]  [place] [address]  Phone: [phone]     [facility]   [address]  [address]  [place] [address]  Phone: [phone]     [facility]  [address]  [address]  [place] [address]  Phone: [phone]       Allergy:  Allergies[1]    Medication list:   Pharmacist Note: ED Status:   Home Medications    Medication Sig Taking? Last Dose   aspirin 325 MG tablet Take 1 tablet (325 mg total) by mouth daily. Yes 08/24/2023   atorvastatin (LIPITOR) 80 MG tablet Take 1 tablet (80 mg total) by mouth every morning. Yes 08/24/2023   BIOTIN ORAL Take 1 tablet by mouth daily. Yes Past Month   busPIRone (BUSPAR) 10 MG tablet Take 1 tablet (10 mg total) by mouth in the morning and at bedtime. Yes 08/24/2023   calcium-vitamin D (OSCAL-500 + D) 500 mg-5 mcg (200 unit) per tablet Take 1 tablet by mouth daily. Yes 08/24/2023   gabapentin (NEURONTIN) 300 MG capsule Take 1 capsule (300 mg total) by mouth 3 times a day. Yes 08/25/2023   leucovorin-pyridox-mecobalamin (FOLINIC-PLUS) 4-50-2 mg Tab Take 1 tablet by mouth daily. Yes 08/24/2023   lisinopriL (PRINIVIL) 10 MG tablet Take 1 tablet (10 mg total) by mouth daily. Yes 08/24/2023   MELATONIN ORAL Take 10 mg  by mouth at bedtime as needed (sleep). Yes Past Week   multivitamin (THERAGRAN) tablet Take 1 tablet by mouth daily. Yes Past Month   PARoxetine (PAXIL) 20 MG tablet Take 1 tablet (20 mg total) by mouth at bedtime. Yes 08/24/2023   roPINIRole (REQUIP) 1 MG tablet Take 1 tablet (1 mg total) by mouth at bedtime. Indications: Restless Legs Syndrome Yes 08/24/2023   lipase/protease/amylase (CREON ORAL) Take 1 capsule by mouth 3 times a day as needed (for abdominal cramping). Take prior to meals  More than a month   loperamide (IMODIUM) 2 mg capsule Take 1-2 capsules (2-4 mg total) by mouth 4 times a day as needed for Diarrhea. Take 4 mg (2 capsules) after the first loose stool and then 1 capsule for each loose stool thereafter  Unknown   ondansetron (ZOFRAN) 8 MG tablet Take 1 tablet (8 mg total) by mouth every 8 hours as needed for Nausea.  More than a month   polyethylene glycol (MIRALAX) 17 gram/dose powder Take 17 g by mouth daily as needed (constipation).  Unknown       Allergies updated:  None    Medications flagged for removal and reason for discontinuation:  Calcium carbonate - therapy  changed    Medications added to PTA medication list:  See medication list for specific doses, routes, and frequencies  Calcium - vitamin D3  Imodium   Folinic Plus (LF in Feb 2025 x90 day supply per [facility])     Medications adjusted or instructions changed:   Biotin - specified directions, unsure of dose   Creon - changed from TID to PRN   Paroxetine - qHS .  Miralax - changed daily to PRN   Ropinirole - qHS, for RLS   Lisinopril - removed wording of a half tablet   Melatonin - changed from 3-10 mg to 10 mg PRN     Other notes:  Patient reports not needing either Imodium or Miralax recently, states bowels have been regular     Additions and modifications to the prior to admission medication list have been documented within Review PTA Medications tab. Deletions have been documented above. To my knowledge the medication  reconciliation is accurate as of 08/25/2023 6:53 PM.  Medication histories are being confirmed with pharmacy fill records, as available.         Nori Winegar, PharmD  08/25/2023 6:53 PM  [phone]           [1]   Allergies  Allergen Reactions    Penicillins Anaphylaxis     Facial Swelling  immediate rash, facial/tongue/throat swelling, SOB or lightheadedness with hypotension: Yes  severe rash involving mucus membranes or skin necrosis: Yes  Has patient had a PCN reaction that required hospitalization: No  Has patient had a PCN reaction occurring within the last 10 years: No  If all of the above answers are NO, then may proceed with Cephalosporin use.    Adhesive      Other reaction(s): breaks out and itches if on for long time    Formaldehyde Analogues     Nsaids (Non-Steroidal Anti-Inflammatory Drug)      Severe GI issues    Promethazine Hcl      Restless legs    Latex Rash     States she has a sensitivity to latex after being around it for days, she gets a rash: she states that when she wore her cpap machine for 14 days she developed a rash on her face other than this she has no problems stating her dentist wears latex gloves and she has no problems with latex gloves.     Sulfa (Sulfonamide Antibiotics) Rash

## 2023-08-25 NOTE — ED Provider Notes (Signed)
 [facility]  Emergency Department Reassessment Note    Joanna Lopez is a 73 y.o. female patient who presented to the emergency department. This patient was initially seen by an off-going provider. Please see that provider's note for details regarding the initial history, physical exam and ED course.    Care of this patient was signed out to me. At the time of turnover the following steps in the patient's evaluation were pending:   - follow-up CTs    Radiology:  CT Pulmonary Angiography   Final Result   IMPRESSION:      CHEST CTA   No acute pulmonary embolism.         ABDOMEN AND PELVIS   Mild inflammatory changes adjacent to a multilobulated septated left hepatic cystic lesion; nonspecific and may represent infection or inflammation of the cystic lesion versus recent rupture.         Approved by Ian Malkin, MD on 08/25/2023 6:03 AM EDT      I have personally reviewed the images and I agree with this report.      Report Verified by: Micheal Likens, DO at 08/25/2023 6:29 AM EDT      CT Abdomen and Pelvis With IV contrast   Final Result   IMPRESSION:      CHEST CTA   No acute pulmonary embolism.         ABDOMEN AND PELVIS   Mild inflammatory changes adjacent to a multilobulated septated left hepatic cystic lesion; nonspecific and may represent infection or inflammation of the cystic lesion versus recent rupture.         Approved by Ian Malkin, MD on 08/25/2023 6:03 AM EDT      I have personally reviewed the images and I agree with this report.      Report Verified by: Micheal Likens, DO at 08/25/2023 6:29 AM EDT      X-ray Chest PA and Lateral   Final Result   IMPRESSION:       No acute cardiopulmonary abnormality.      Approved by Ian Malkin, MD on 08/25/2023 4:46 AM EDT      I have personally reviewed the images and I agree with this report.      Report Verified by: Micheal Likens, DO at 08/25/2023 5:11 AM EDT        CT is most notable for mild inflammatory changes adjacent to a multilobulated septated left  hepatic cystic lesion for which differential includes infection, inflammation, or recent rupture.  She does have a leukocytosis.  Patient states that she has been off of her immunotherapy for the last 3 to 4 months.  No reported fevers.  Her pain has improved significantly while here.  Of note the cystic lesion does appear enlarged in comparison to imaging from 2018.  Discussed with gastroenterology who recommends antibiotics and admission for endoscopy.  Patient is agreeable to this plan.    Clinical Impression:  1. Pain of upper abdomen    2. Hepatic cyst         Disposition:  At this time the patient has been admitted for further evaluation and management. The patient will continue to be monitored here in the emergency department until which time she is moved to her new treatment location.       Obelia Bonello [place], MD, MD      This note was dictated using voice-recognition software, which occasionally leads to inadvertent typographic errors.

## 2023-08-25 NOTE — ED Notes (Signed)
 Pt O2 at 86% while resting, pt placed on 2L via NC. Pt O2 at 93% MD aware.

## 2023-08-25 NOTE — Consults (Signed)
 GASTROENTEROLOGY CONSULTATION    Name: Joanna Lopez  MRN: [id number]  CSN: [id number]    Date of Visit: 08/25/2023      Assessment/Plan:  Hepatic cystic lesions: w/intermittent LUQ for past 6-8weeks w/increasing pain in past 24hrs w/associated nausea but no vomiting. Normal LFTs, but (+) leukocytosis, now on antibiotics. CT AP w/multiple hepatic cystic lesions in left lobe and increasing size of R hepatic cyst (prev seen in 2018), but no ductal dilation to suggest obstruction. No recent travel, sick contacts, antibiotics, steroids and off chemo since 02/2023. ?simple hepatic cysts vs infectious vs r/t prior lung CA?   Keep NPO for now  Continue trending labs  Continue IV antibiotics for now  Follow up cultures  Will discuss w/IR to see if lesions amenable to bx from percutaneous perspective      The patient was seen today and discussed in detail with Dr. Rocky Crafts, including history, physical exam, pertinent labs and imaging, as well as the assessment and plan.     Fatin Bachicha L. Alethia Berthold, APRN-CNP  NP Service pager: [phone])  If calling outside NP coverage hours, please refer to [facility]genda.            08/25/2023                 11:27 AM    Patient:Joanna Lopez  Requesting MD: Dr. Jeralene Huff    History: Joanna Lopez is an 73 y.o. female w/PMH including anxiety, HLD, HTN, OSA, RLS, CVA, bil cataract removal (2023), prior left lung CA s/p VATS (2018) on chronic alectinib until ~02/2023 d/t side effects being seen hospital day 0 in consultation for hepatic lesions on CT.     Presented to [facility] yesterday w/complaints of LUQ pain w/associated nausea but no vomiting. Reports brief (5-99min) episodes of LUQ pain about 2x/week for past 6-8weeks w/o provocation and self alleviating, describes as a tightening. Yesterday, had recurrence of pain w/increasing intensity and lasting for > 12hrs and ultimately came to ED for further evaluation. Denies baseline N/V, no dysphagia, globus,  reflux/regurg. Denies chronic abd pains, no sig bloating or dyspepsia. Appetite stable. No change in bowel habits, no BRBPR or melena. No specific fevers or chills. Denies known sick contacts, traveled to [place] early March for 10days (symptoms predate this). No recent antibiotics or steroids.     In ED, labs showed mild elevated SCr 1.5, normal LFTs, leukocytosis of 15.5, Hgb stable and INR 1.0. CT AP obtained showing multiple hepatic cystic lesions in left lobe (similar appearance to 2018 scan) and enlarged R hepatic lobe cyst w/o biliary ductal dilation, normal pancreas and patent PV. CTPA negative for PE. She was given IV antibiotics and admitted for further evaluation.       BUN 33 (base ~25-30)  SCr 1.54 (base ~1.1-1.3)  AST/ALT 21/20  TBili 0.5  WBC 15.5  Hgb 13.8 (base ~12)  INR 1.0      Most recent colonoscopy: (2014 Dr. Mickie Bail 2/2 chronic diarrhea)  IMPRESSION:  Normal colonoscopy.  Random colon biopsies are pending at this  time.  If these biopsies are normal, we would do some stool testing to  further evaluate the cause of her diarrhea.  FINAL DIAGNOSIS:   Colon, random biopsy:   - Colonic mucosa with focal edema and hemorrhage.   - No evidence of microscopic colitis, active inflammation or chronic glandular injury.     Wt Readings from Last 10 Encounters:   08/25/23 195 lb (88.5 kg)   04/27/23 195  lb (88.5 kg)   04/27/23 195 lb (88.5 kg)   04/16/23 193 lb 3.2 oz (87.6 kg)   02/09/23 190 lb (86.2 kg)   02/05/23 190 lb 6.4 oz (86.4 kg)   01/01/23 189 lb (85.7 kg)   10/21/22 187 lb (84.8 kg)   10/20/22 188 lb 12.8 oz (85.6 kg)   09/25/22 179 lb 9.6 oz (81.5 kg)     Active Problems:    * No active hospital problems. *    Past Medical History:   Diagnosis Date    Anxiety     Basal cell cancer     Cataract     Hyperlipidemia     Hypertension     Lung nodule     OSA (obstructive sleep apnea)     RLS (restless legs syndrome)     Stroke (CMS-HCC)     5 yrs ago, TIA X 2     Past Surgical History:   Procedure  Laterality Date    APPENDECTOMY      CATARACT EXTRACTION, BILATERAL Bilateral 07/2021    COLONOSCOPY N/A 01/23/2013    Procedure: COLONOSCOPY WITH MAC;  Surgeon: Francoise Ishihara, MD;  Location: [facility] ENDOSCOPY;  Service: Gastroenterology;  Laterality: N/A;    FLEXIBLE BRONCHOSCOPY W/ UPPER ENDOSCOPY N/A 09/17/2016    Procedure: BRONCHOSCOPY SUPER D WITH FLUORO AND BIOPSY-EBUS/NAVI;  Surgeon: Sadia Benzaquen, MD;  Location: [facility] ENDOSCOPY;  Service: Pulmonary;  Laterality: N/A;    INSERTION CATHETER VASCULAR ACCESS N/A 12/26/2021    Procedure: INSERTION POWER PORT A CATH;  Surgeon: Elnor Hail, MD;  Location: [facility] OR;  Service: General;  Laterality: N/A;    LOOP IMPLANT IMPLANTATION N/A 04/07/2017    Procedure: Loop  Implantation;  Surgeon: Chandler Combs, MD;  Location: [facility] ELECTROPHYSIOLOGY LAB;  Service: Electrophysiology;  Laterality: N/A;    LOOP REMOVAL Left 04/09/2021    Procedure: Loop Removal;  Surgeon: Leo Rainbow, MD;  Location: [facility] MIS SUITE;  Service: Electrophysiology;  Laterality: Left;    submastoid gland removal      THORACOSCOPY Right 11/23/2016    Procedure: RIGHT SIDED VATS MIDDLE LOBE LOBECTOMY,HILAR MEDIASTINAL LYMPH NODE DISSECTION;  Surgeon: Crist Dominion, MD;  Location: [facility] OR;  Service: Thoracic;  Laterality: Right;    THORACOSCOPY LOBECTOMY VIDEO ASSISTED Left 11/18/2021    Procedure: Left Thoracoscopic Lingulectomy;  Surgeon: Crist Dominion, MD;  Location: [facility] OR;  Service: General;  Laterality: Left;    TONSILLECTOMY      UVULOPALATOPHARYNGOPLASTY       Family History   Adopted: Yes   Problem Relation Age of Onset    Diabetes Son      Social History[1]  Allergies[2]    Prior to Admission Meds:  Home Medications    Medication Sig Taking? Last Dose   aspirin 325 MG tablet Take 1 tablet (325 mg total) by mouth daily.     atorvastatin (LIPITOR) 80 MG tablet Take 1 tablet (80 mg total) by mouth daily.     biotin 5,000 mcg Chew Chew by mouth.     busPIRone (BUSPAR) 10 MG tablet Take 1 tablet  (10 mg total) by mouth in the morning and at bedtime.     calcium carbonate (CALCIUM 300 ORAL) Take 1 tablet by mouth daily.     gabapentin (NEURONTIN) 300 MG capsule Take 1 capsule (300 mg total) by mouth 3 times a day.  Patient taking differently: Take 1 capsule (300 mg total) by mouth daily.     lipase/protease/amylase (CREON ORAL) Take  1 capsule by mouth 3 times a day.     lisinopriL (PRINIVIL) 10 MG tablet Take 1 tablet (10 mg total) by mouth daily. 10 mg (half tablet) daily     MELATONIN ORAL Take 3-10 mg by mouth at bedtime. PRN for sleep     multivitamin (THERAGRAN) tablet Take 1 tablet by mouth daily.     ondansetron (ZOFRAN) 8 MG tablet Take 1 tablet (8 mg total) by mouth every 8 hours as needed for Nausea.     PARoxetine (PAXIL) 20 MG tablet Take 1 tablet (20 mg total) by mouth daily.     polyethylene glycol (MIRALAX) 17 gram/dose powder Take 17 g by mouth daily.     roPINIRole (REQUIP) 1 MG tablet Take 1 tablet (1 mg total) by mouth daily.       Pertinent items are noted in HPI.    Labs:  Lab Results   Component Value Date    GLUCOSE 135 (H) 08/25/2023    BUN 33 (H) 08/25/2023    CO2 25 08/25/2023    CREATININE 1.54 (H) 08/25/2023    K 4.2 08/25/2023    NA 140 08/25/2023    CL 106 08/25/2023    CALCIUM 9.3 08/25/2023     Lab Results   Component Value Date    WBC 15.5 (H) 08/25/2023    HGB 13.8 08/25/2023    HCT 41.0 08/25/2023    MCV 92.6 08/25/2023    PLT 194 08/25/2023     Lab Results   Component Value Date    INR 1.0 08/25/2023     Lab Results   Component Value Date    ALKPHOS 73 08/25/2023    ALT 20 08/25/2023    AST 21 08/25/2023    BILITOT 0.5 08/25/2023    ALBUMIN 4.3 08/25/2023    BILIDIRECT 0.1 08/25/2023    PROT 7.0 08/25/2023     Results for orders placed during the hospital encounter of 08/25/23    CT ABDOMEN AND PELVIS WITH IV CONTRAST    Impression  CHEST CTA  No acute pulmonary embolism.      ABDOMEN AND PELVIS  Mild inflammatory changes adjacent to a multilobulated septated left hepatic  cystic lesion; nonspecific and may represent infection or inflammation of the cystic lesion versus recent rupture.      Approved by Lupie Salle, MD on 08/25/2023 6:03 AM EDT    I have personally reviewed the images and I agree with this report.    Report Verified by: Ellis Guys, DO at 08/25/2023 6:29 AM EDT    Signed by: Ellis Guys, DO on 08/25/2023  6:29 AM    Vitals:  Blood pressure 99/57, pulse 61, temperature 97.7 F (36.5 C), temperature source Oral, resp. rate 16, height 5' 6 (1.676 m), weight 195 lb (88.5 kg), SpO2 97%.  General appearance: alert, appears stated age, and cooperative  Lungs: no respiratory distress and on 2L/NC  Heart: regular rate and rhythm  Abdomen: soft, mild LUQ discomfort (more under ribs), non-distended, hypoactive BSx4  Extremities: extremities normal, atraumatic, no cyanosis or edema  Neurologic: Grossly normal         [1]   Social History  Tobacco Use    Smoking status: Never    Smokeless tobacco: Never   Vaping Use    Vaping status: Never Used   Substance Use Topics    Alcohol use: Yes     Alcohol/week: 2.0 standard drinks of alcohol     Types: 2 Glasses  of wine per week     Comment: occasionally    Drug use: No   [2]   Allergies  Allergen Reactions    Penicillins Anaphylaxis     Facial Swelling  immediate rash, facial/tongue/throat swelling, SOB or lightheadedness with hypotension: Yes  severe rash involving mucus membranes or skin necrosis: Yes  Has patient had a PCN reaction that required hospitalization: No  Has patient had a PCN reaction occurring within the last 10 years: No  If all of the above answers are NO, then may proceed with Cephalosporin use.    Adhesive      Other reaction(s): breaks out and itches if on for long time    Formaldehyde Analogues     Nsaids (Non-Steroidal Anti-Inflammatory Drug)      Severe GI issues    Promethazine Hcl      Restless legs    Latex Rash     States she has a sensitivity to latex after being around it for days, she gets a rash:  she states that when she wore her cpap machine for 14 days she developed a rash on her face other than this she has no problems stating her dentist wears latex gloves and she has no problems with latex gloves.     Sulfa (Sulfonamide Antibiotics) Rash

## 2023-08-26 DIAGNOSIS — K7689 Other specified diseases of liver: Secondary | ICD-10-CM

## 2023-08-26 LAB — BASIC METABOLIC PANEL
Anion Gap: 6 mmol/L (ref 3–16)
BUN: 21 mg/dL (ref 7–25)
CO2: 27 mmol/L (ref 21–33)
Calcium: 8.9 mg/dL (ref 8.6–10.3)
Chloride: 109 mmol/L (ref 98–110)
Creatinine: 1.16 mg/dL (ref 0.60–1.30)
EGFR: 50
Glucose: 96 mg/dL (ref 70–100)
Osmolality, Calculated: 297 mosm/kg (ref 278–305)
Potassium: 4.8 mmol/L (ref 3.5–5.3)
Sodium: 142 mmol/L (ref 133–146)

## 2023-08-26 LAB — HEPATIC FUNCTION PANEL
ALT: 15 U/L (ref 7–52)
AST: 17 U/L (ref 13–39)
Albumin: 3.6 g/dL (ref 3.5–5.7)
Alkaline Phosphatase: 59 U/L (ref 36–125)
Bilirubin, Direct: 0.1 mg/dL (ref 0.0–0.4)
Bilirubin, Indirect: 0.4 mg/dL (ref 0.0–1.1)
Total Bilirubin: 0.5 mg/dL (ref 0.0–1.5)
Total Protein: 5.8 g/dL — ABNORMAL LOW (ref 6.4–8.9)

## 2023-08-26 LAB — CBC
Hematocrit: 37.6 % (ref 35.0–45.0)
Hemoglobin: 12.6 g/dL (ref 11.7–15.5)
MCH: 31.4 pg (ref 27.0–33.0)
MCHC: 33.6 g/dL (ref 32.0–36.0)
MCV: 93.4 fL (ref 80.0–100.0)
MPV: 9.5 fL (ref 7.5–11.5)
Platelets: 135 10*3/uL — ABNORMAL LOW (ref 140–400)
RBC: 4.02 10*6/uL (ref 3.80–5.10)
RDW: 13.8 % (ref 11.0–15.0)
WBC: 8.7 10*3/uL (ref 3.8–10.8)

## 2023-08-26 LAB — DIFFERENTIAL
Basophils Absolute: 52 /uL (ref 0–200)
Basophils Relative: 0.6 % (ref 0.0–1.0)
Eosinophils Absolute: 183 /uL (ref 15–500)
Eosinophils Relative: 2.1 % (ref 0.0–8.0)
Lymphocytes Absolute: 2001 /uL (ref 850–3900)
Lymphocytes Relative: 23 % (ref 15.0–45.0)
Monocytes Absolute: 835 /uL (ref 200–950)
Monocytes Relative: 9.6 % (ref 0.0–12.0)
Neutrophils Absolute: 5629 /uL (ref 1500–7800)
Neutrophils Relative: 64.7 % (ref 40.0–80.0)
nRBC: 0 /100{WBCs} (ref 0–0)

## 2023-08-26 MED ORDER — ciprofloxacin HCl (CIPRO) 500 MG tablet
500 | ORAL_TABLET | Freq: Two times a day (BID) | ORAL | 0 refills | 7.00000 days | Status: AC
Start: 2023-08-26 — End: 2023-08-31

## 2023-08-26 MED ORDER — metroNIDAZOLE (FLAGYL) 500 MG tablet
500 | ORAL_TABLET | Freq: Three times a day (TID) | ORAL | 0 refills | Status: AC
Start: 2023-08-26 — End: 2023-08-31

## 2023-08-26 MED FILL — METRONIDAZOLE 500 MG/100 ML IN SODIUM CHLOR(ISO) INTRAVENOUS PIGGYBACK: 500 500 mg/100 mL | INTRAVENOUS | Qty: 100

## 2023-08-26 MED FILL — PAROXETINE 20 MG TABLET: 20 20 MG | ORAL | Qty: 1

## 2023-08-26 MED FILL — ROPINIROLE 1 MG TABLET: 1 1 MG | ORAL | Qty: 1

## 2023-08-26 MED FILL — BUSPIRONE 10 MG TABLET: 10 10 MG | ORAL | Qty: 1

## 2023-08-26 MED FILL — ATORVASTATIN 80 MG TABLET: 80 80 MG | ORAL | Qty: 1

## 2023-08-26 MED FILL — SODIUM CHLORIDE 0.9 % INTRAVENOUS SOLUTION: 75.0000 75.0000 mL/hr | INTRAVENOUS | Qty: 1000

## 2023-08-26 MED FILL — ENOXAPARIN 40 MG/0.4 ML SUBCUTANEOUS SYRINGE: 40 40 mg/0.4 mL | SUBCUTANEOUS | Qty: 0.4

## 2023-08-26 MED FILL — GABAPENTIN 300 MG CAPSULE: 300 300 MG | ORAL | Qty: 1

## 2023-08-26 MED FILL — OYSTER SHELL CALCIUM-VITAMIN D3 500 MG-5 MCG (200 UNIT) TABLET: 500 500 mg-5 mcg (200 unit) | ORAL | Qty: 1

## 2023-08-26 MED FILL — THERA 400 MCG TABLET: 400 400 mcg | ORAL | Qty: 1

## 2023-08-26 MED FILL — CEFTRIAXONE 2 GRAM SOLUTION FOR INJECTION: 2 2 gram | INTRAMUSCULAR | Qty: 1

## 2023-08-26 NOTE — Discharge Summary (Signed)
 Discharge Summary    Name:  Joanna Lopez DOB/Age/Sex: [birth year]  (73 y.o. female)   MRN & CSN:  [id number] & [id number] Admission Date/Time: 08/25/2023  3:56 AM  Discharge Date/Time: 08/26/2023 12:19 PM   Attending:  No att. providers found Discharging Physician: Miriam Kestler, MD     Hospital Course:   Joanna Lopez is a 73 y.o.  female  who presents with <principal problem not specified>    HPI  Joanna Lopez is a 73 y.o. female with PMH of lung cancer (sp VATS, chemo), HTN,  who presents with worsening LUQ abd pain over the last 6 weeks. Patient states that pain was intermittent at first associated with nausea but no vomiting.  She also reports shortness of breath when she is in severe pain.  Patient has been becoming more constant for the past 12 hours. Last BM 2 days ago.      At ED, pt was afebrile, hemodynamically stable; on room air. Labs remarkable for CR 1.54, WBC 15.5. had CTPA, CT abd w contrast which showed Mild inflammatory changes adjacent to a multilobulated septated left hepatic cystic lesion; nonspecific and may represent infection or inflammation of the cystic lesion versus recent rupture. . GI consulted who recommended abx. Discussed with ED, will admit pt to med surg  for suspected ruptured hepatic cyst  workup and management.        The following problems have been addressed during this hospitalization.  Hepatic cystic lesions . Concern for ruptured cyst.  Ongoing LUQ abd pain past few weeks. Has leukocytosis WBC 15.5  GI consulted who recommended IR evaluation for probable drainage  Started on w rocephin , flagyl  discharged on cipro +flagyl  5 more days per GI recs  Case was discussed w IR who recommended outpatient follow up for drainage if indicated     AKI on CKD III.   Came  Cr 1.54 (was 1.21 07/2023)  Suspect from dehydration  Received  IVF with improvement in Cr to 1.16        Lung cancer  Follows hem/onc outpatient     Essential HTN  On home lisinopril   - held on  admission given AKI but resumed on discharge    Patient was seen and examined at bedside on day of discharge. This note can be used as the progress note for today's visit.  Pt states her pain has improved. Seen by GI who cleared patient for discharge      Physical Exam  Vitals:   Vitals:    08/26/23 1103   BP: 120/72   Pulse: 79   Resp: 16   Temp: 98.5 F (36.9 C)   SpO2: 93%       General: NAD  Eyes: EOMI  ENT: neck supple  Cardiovascular: Regular rate.  Respiratory: Clear to auscultation  Gastrointestinal: Soft, non tender  Genitourinary: no suprapubic tenderness  Musculoskeletal: No edema  Skin: warm, dry  Neuro: Alert.  Psych: Mood appropriate.     Lines/Drains/Airways:    The patient expressed appropriate understanding of and agreement with the discharge recommendations, medications, and plan.     Consults this admission:  ED CONTACT PROVIDER [facility]  ED CONTACT PROVIDER [facility]      Discharge Instruction:   Handoff to PCP:     Follow up appointments: GI, IR  Primary care physician: Jerel Monarch, MD      Diet:    Diet/Nutrition Orders   None    regular diet   Activity: activity as  tolerated  Disposition: Discharged to:    [x] Home, [] HHC, [] SNF, [] Acute Rehab, [] Hospice   Condition on discharge: Stable    Discharge Medications:        Medication List        TAKE these medications, which are NEW        Quantity/Refills   ciprofloxacin  HCl 500 MG tablet  Commonly known as: CIPRO   Take 1 tablet (500 mg total) by mouth 2 times a day for 5 days.   Quantity: 10 tablet  Refills: 0     metroNIDAZOLE  500 MG tablet  Commonly known as: FLAGYL   Take 1 tablet (500 mg total) by mouth 3 times a day for 5 days.   Quantity: 15 tablet  Refills: 0            TAKE these medications, which you were ALREADY TAKING        Quantity/Refills   aspirin  325 MG tablet  Take 1 tablet (325 mg total) by mouth daily.   Quantity: 30 tablet  Refills: 0     atorvastatin  80 MG tablet  Commonly known as: LIPITOR   Take 1 tablet (80 mg total) by mouth every  morning.   Refills: 0     BIOTIN ORAL  Take 1 tablet by mouth daily.   Refills: 0     busPIRone  10 MG tablet  Commonly known as: BUSPAR   Take 1 tablet (10 mg total) by mouth in the morning and at bedtime.   Refills: 0     calcium -vitamin D  500 mg-5 mcg (200 unit) per tablet  Commonly known as: OSCAL-500 + D  Take 1 tablet by mouth daily.   Refills: 0     CREON  ORAL  Take 1 capsule by mouth 3 times a day as needed (for abdominal cramping). Take prior to meals   Refills: 0     Folinic-Plus 4-50-2 mg Tab  Generic drug: leucovorin-pyridox-mecobalamin  Take 1 tablet by mouth daily.   Refills: 0     gabapentin  300 MG capsule  Commonly known as: NEURONTIN   Take 1 capsule (300 mg total) by mouth 3 times a day.   Quantity: 90 capsule  Refills: 1     lisinopriL  10 MG tablet  Commonly known as: PRINIVIL   Take 1 tablet (10 mg total) by mouth daily.   Refills: 0     loperamide  2 mg capsule  Commonly known as: IMODIUM   Take 1-2 capsules (2-4 mg total) by mouth 4 times a day as needed for Diarrhea. Take 4 mg (2 capsules) after the first loose stool and then 1 capsule for each loose stool thereafter   Refills: 0     MELATONIN ORAL  Take 10 mg by mouth at bedtime as needed (sleep).   Refills: 0     Miralax  17 gram/dose powder  Generic drug: polyethylene glycol  Take 17 g by mouth daily as needed (constipation).   Refills: 0     multivitamin tablet  Commonly known as: THERAGRAN  Take 1 tablet by mouth daily.   Refills: 0     ondansetron  8 MG tablet  Commonly known as: ZOFRAN   Take 1 tablet (8 mg total) by mouth every 8 hours as needed for Nausea.   Quantity: 30 tablet  Refills: 3     PARoxetine  20 MG tablet  Commonly known as: PAXIL   Take 1 tablet (20 mg total) by mouth at bedtime.   Refills: 0     roPINIRole  1 MG tablet  Commonly known  as: REQUIP   Take 1 tablet (1 mg total) by mouth at bedtime. Indications: Restless Legs Syndrome   For: Restless Legs Syndrome  Refills: 0               Where to Get Your Medications        These  medications were sent to [facility] [id number] - [place], [place] - [address] AT [facility] [address]  [address], [place] [address]      Phone: [phone]   ciprofloxacin  HCl 500 MG tablet  metroNIDAZOLE  500 MG tablet         Objective Findings at Discharge:       BMP/CBC  No results for input(s): NA, K, CL, CO2, BUN, CREATININE, WBC, HEMOGLOBIN, HCT, PLT in the last 72 hours.    Invalid input(s): GLU    IMAGING:      Additional Information: Patient seen and examined day of discharge. For more information regarding patient's care please contact [facility] medical records      Discharge Time of 35 minutes    Zubin Pontillo, MD

## 2023-08-26 NOTE — Nursing Note (Signed)
 [facility] 5PCT Nursing  Note    Patient Name: Joanna Lopez    Admit Date:08/25/2023     Admission Diagnosis: Hepatic cyst [K76.89]  Pain of upper abdomen [R10.10]  Left upper quadrant abdominal pain [R10.12]       LEYANI GARGUS is alert and oriented. Patient denies pain. Call light within reach. Bed in lowest position. Bed alarm activated. RN will continue plan of care.      Patient Vitals for the past 12 hrs:   BP Temp Temp src Pulse Resp SpO2   08/26/23 0226 100/51 98.7 F (37.1 C) Oral 73 20 93 %   08/25/23 2121 125/69 98.5 F (36.9 C) Oral 59 20 96 %   08/25/23 2000 -- -- -- 66 21 94 %   08/25/23 1939 -- -- -- 64 17 98 %         O2 Device: None (Room air)           O2 Flow Rate (L/min): 2 L/min       I/O last 3 completed shifts:  In: 1723.5 [I.V.:723.5; IV Piggyback:1000]  Out: -       No intake/output data recorded.         Margo Shells, RN, RN 08/26/2023 6:59 AM

## 2023-08-26 NOTE — Other (Signed)
 [facility]    Case Manager/Social Worker Discharge Summary     Patient name: Joanna Lopez                                        Patient MRN: [id number]  DOB: July 12, 1950                              Age: 73 y.o.              Gender: female  Patient emergency contact: Extended Emergency Contact Information  Primary Emergency Contact: Straw,Alace   [place]  Mobile Phone: [phone]  Relation: Daughter  Preferred language: English  Interpreter needed? No  Secondary Emergency Contact: Eakins,David   [place] [employer] Phone: [phone]  Relation: Son  Preferred language: English  Interpreter needed? No      Attending provider: No att. providers found  Primary care physician: Jerel Monarch, MD    The MD has indicated that the patient is ready for discharge. The patient will be transported by herself at discharge. There are no CM needs. Patient is agreeable with discharge plan.     Transfer Mode/Level of Care: Other (Comment)    The plan has been reviewed:     Patient/Family Informed of Discharge Plan: Yes    Plan Reviewed With Patient, Family, or Significant Other: Yes    Patient and or family are aware and in agreement with the discharge plan: Yes             Plan reviewed with MD and other members of the health care team: Yes  Care Plan Completed: Yes    No further CM/SW needs.    This plan has been reviewed with the multi-disciplinary team.     Clenton Czech RN, CM   [phone]    Treatment Preferences         Post-Discharge Goals    Patient's Post-Discharge goals: to return home    Post Acute Care Provider Information:           Community Services at Discharge  Community Services at Home post discharge: Not Applicable

## 2023-08-26 NOTE — Progress Notes (Signed)
 GASTROENTEROLOGY PROGRESS NOTE    ADMIT DATE:  08/25/2023  ADMISSION DIAGNOSIS: Hepatic cyst [K76.89]  Pain of upper abdomen [R10.10]  Left upper quadrant abdominal pain [R10.12]    Interval History:   She is much improved.  The pain on admission was 8 - 9 and is now a 2 - 3.  There has been no fever.  She id tolerating a diet.  IR initially felt that the drainage could be post-discharge.       Past Medical History:    Anxiety     Basal cell cancer     Cataract     Hyperlipidemia     Hypertension     Lung nodule     OSA (obstructive sleep apnea)     RLS (restless legs syndrome)     Stroke (CMS-HCC)         Past Surgical History:    APPENDECTOMY      CATARACT EXTRACTION, BILATERAL Bilateral 07/2021    COLONOSCOPY  01/23/2013    FLEXIBLE BRONCHOSCOPY W/ UPPER ENDOSCOPY  09/17/2016    INSERTION CATHETER VASCULAR ACCESS  12/26/2021    LOOP IMPLANT IMPLANTATION  04/07/2017    LOOP REMOVAL Left 04/09/2021    submastoid gland removal      THORACOSCOPY Right 11/23/2016    THORACOSCOPY LOBECTOMY VIDEO ASSISTED Left 11/18/2021    TONSILLECTOMY      UVULOPALATOPHARYNGOPLASTY         Current Facility-Administered Medications   Medication Dose Frequency    acetaminophen  650 mg Q4H PRN    atorvastatin  80 mg QAM    busPIRone  10 mg BID    calcium-vitamin D  1 tablet Daily 0900    cefTRIAXone (ROCEPHIN) IVPB  2 g Q24H    enoxaparin  40 mg Daily 0900    gabapentin  300 mg TID    [Held by provider] lisinopriL  10 mg Daily 0900    melatonin  9 mg Nightly PRN    metroNIDAZOLE (iso-osm)  500 mg Q8H    multivitamin with folic acid  1 tablet Daily 0900    ondansetron  4 mg Q8H PRN    PARoxetine  20 mg Nightly (2100)    roPINIRole  1 mg Nightly (2100)       Vitals:  BP 140/72 (BP Location: Right upper arm, Patient Position: Lying, BP Cuff Size: Regular)   Pulse 82   Temp 98.6 F (37 C) (Oral)   Resp 17   Ht 5' 6 (1.676 m)   Wt 192 lb 2 oz (87.1 kg)   SpO2 94%   BMI 31.01 kg/m     PHYSICAL EXAM:      General appearance: alert,  appears stated age, cooperative, and no distress  Lungs: clear to auscultation bilaterally and no respiratory distress  Heart: regular rate and rhythm, S1, S2 normal, no murmur, click, rub or gallop  Abdomen: soft, non-tender; bowel sounds normal; no masses,  no organomegaly and minimal LUQ tenderness  Extremities: extremities normal, atraumatic, no cyanosis or edema      RECENT LABS:    08/25/23 08/26/23   WBC 15.5* 8.7   HGB 13.8 12.6   HCT 41.0 37.6   MCV 92.6 93.4   PLT 194 135*     NA 140 142   K 4.2 4.8   CL 106 109   CO2 25 27   BUN 33* 21   CREATININE 1.54* 1.16     AST 21 17   ALT  20 15   BILITOT 0.5 0.5   ALKPHOS 73 59       IMPRESSION / RECOMMENDATIONS:      The pain was the major reason for admission and it is resolving.  Continue antibiotics for an additional five days (PO)  Schedule cyst drainage with IR post-discharge  GI will sign off    Portions of this note have been copied forward.  I have reviewed and updated the HPI, histories, medications, allergies, review of systems, physical exam, data, assessment, and plan of the note so that it reflects the current evaluation and management of the patient by me.    Electronically signed by Micheline Ahr, MD on 08/26/2023 at 10:05 AM .

## 2023-08-26 NOTE — Discharge Instructions (Signed)
-   Please schedule an appointment to see your PCP  - Please schedule an appointment to see GI  - Please schedule an appointment to see IR for drainage procedure   - please take medications as prescribed

## 2023-08-26 NOTE — Progress Notes (Signed)
 Pt d/c to home. Pt's PIV removed. Pt's belongings taken with them. AVS reviewed with pt and no questions or concerns at this time.    Alwin Joy, RN

## 2023-08-26 NOTE — Consults (Addendum)
 [facility]  Care Management/Social Work Assessment/Discharge Summary Note       Patient Information     Patient Name: Joanna Lopez  MRN: [id number]  Hospital Day: 1  Inpatient/Observation: Inpatient  Admit Date: 08/25/2023  Admission Diagnosis: Hepatic cyst [K76.89]  Pain of upper abdomen [R10.10]  Left upper quadrant abdominal pain [R10.12]  Attending provider: No att. providers found    PCP: [name], MD  Home Pharmacy:              [facility] [id number] - [place] - [address] AT [facility] [address]  [address]  [place] [address]  Phone: [phone]     [facility] - [place] - [address]  [address]  [place] [address]  Phone: [phone]     [facility] [place]  [phone]  [address]  [place] [address]  Phone: [phone]     [facility]  [address]  [address]  [place] [address]  Phone: [phone]        Pertinent Medications  Anticoagulation therapy: No  New Diabetic: No      Issues related to obtaining medications: none     Payor Information   Medical Insurance Coverage:  Payor: MEDICARE / Plan: MEDICARE A AND B / Product Type: Medicare /   Secondary Payor: AARP     Functional Assessment   Functional Assessment  Assessment Information Obtained From:: Patient  May We Obtain Collateral Information From Family, Friends and Neighbors?: Yes  Current Mental Status: Awake, Oriented to Person, Oriented to Place, Oriented to Time, Oriented to Situation  Mental Status Prior to Admission: Awake, Oriented to Person, Oriented to Place, Oriented to Time, Oriented to Situation  Mental Health History: No  Suicide Attempts: No  Activities of Daily Living: Independent  Work History: Retired  Marital Status: Widowed  Relative Search Completed: No  Demographics Correct:: Yes    Current Living Arrangements   Current Living Arrangements  Current Living Arrangements: Home  Type of Housing: House  Who do you  live with?: Alone  One Story or Two (check all that apply): One Story  History of Falls?: No    Electrical engineer at Home: Not Applicable  Was any abuse reported by patient?: No    Veteran Status & Connection to [employer] Status & Connection to [employer]  Are you a veteran?: No    Support Systems   Emergency contact: Extended Emergency Contact Information  Primary Emergency Contact: Straw,Alace   [place] [employer] [place] [employer] Phone: [phone]  Relation: Daughter  Preferred language: English  Interpreter needed? No  Secondary Emergency Contact: Loa [place]  Mobile Phone: [phone]  Relation: Son  Preferred language: English  Interpreter needed? No    Support Systems  Primary Caregiver: Self  Marital Status: Widowed  Relative Search Completed: No  Demographics Correct:: Yes  Expected Discharge Disposition: Home  Assessment Information Obtained From:: Patient    Other Pertinent Information     CM met with patient in the room to discuss discharge plan. Patient states she lives alone in a one story home. She denies utilizing any DME or any hhc of any kind. She states she is fully independent and is driving herself home. No CM needs identified.  Advance Directives (For Healthcare)  Advance Directive: Patient does not have advance directive  No Advanced Directive: Patient would NOT like information about advanced directives, forgoing or with-drawing life-sustaining treatment, and withholding resuscitative services  Healthcare Agent Appointed: No  Pre-existing DNR/DNI Order: No  Patient Requests Assistance: No    Discharge Plan     Met with patient to initiate discussion regarding discharge planning. Introduced self and role of case management/social work and provided Tour manager.       Anticipated Discharge Plan: return home     Anticipated Discharge Date: 08-26-23    Anticipated Transportation: patient driving  herself home     Patient/Family aware and taking part in the discharge plan.  Patient/family educated that once post-acute care needs have been identified, a provider list applicable to the identified post-acute care needs as well as the insurance provider will be provided, and patient/family have the freedom to choose their provider(s); financial interest(s) are disclosed as appropriate.         Clenton Czech, RN, CM   Phone Number: [phone]

## 2023-08-26 NOTE — Plan of Care (Signed)
 Problem: Safety  Goal: Patient will be injury free during hospitalization  Description: Assess and monitor vitals signs, neurological status including level of consciousness and orientation. Assess patient's risk for falls and implement fall prevention plan of care and interventions per hospital policy.  Ensure arm band on, uncluttered walking paths in room, adequate room lighting, call light and overbed table within reach, bed in low position, wheels locked, side rails up per policy, and non-skid footwear provided.   Outcome: Progressing  Goal: Patient with weight > 350lbs will have appropriate equipment  Description: Consider ordering Bariatric Bed, Chair and Bedside Commode for patient weight > 350 lbs.  Outcome: Progressing     Problem: Daily Care  Goal: Daily care needs are met  Description: Assess and monitor ability to perform self care and identify potential discharge needs.  Outcome: Progressing     Problem: Psychosocial Needs  Goal: Demonstrates ability to cope with hospitalization/illness  Description: Assess and monitor patients ability to cope with his/her illness.  Outcome: Progressing  Goal: Collaborate with patient/family to identify patient's goals  Outcome: Progressing     Problem: Discharge Barriers  Goal: Patient's discharge needs are met  Description: Collaborate with interdisciplinary team and initiate plans and interventions as needed.   Outcome: Progressing     Problem: Infection  Goal: Signs and symptoms of infections are decreased or avoided  Description: Assess and monitor patient for signs and symptoms of infection such as redness, warmth, discharge, and increased body temperature. Monitor and report abnormal lab values (ex-CBC and diff, serum protein, serum albumin, and cultures).  Wash hands properly before and after each patient care activity. Utilize standard precautions and use personal protective equipment (PPE) as indicated. Ensure aseptic care of all intravenous lines and  invasive tubes/drains. Obtain immunization and exposure to communicable diseases history. Collaborate with interdisciplinary team and initiate plan and interventions as ordered.  Outcome: Progressing

## 2023-08-31 NOTE — Telephone Encounter (Addendum)
 Patient saw Dr. Carey Chapman at [facility] and states she was told to schedule an appt with him once discharged. I am unable to view any available appts for Dr. Carey Chapman. Please review. Patient can be reached at [phone]

## 2023-09-01 NOTE — Telephone Encounter (Signed)
 [facility]  [place]    Patient is calling the nurse triage line to see if Dr. Alverta Johns would place a referral for her to see a liver and Kidney specialist. She stated that she was admitted at [facility] on 08/25/23 with concerns related to her Kidneys and Liver.   She was prescribed antibiotics for Hepatic cystic lesions and pain. She has completed antibiotics and doesn't feel that symptoms have improved. She denied fever/chills. She stated that she continues to endorse LUQ pain. She described the pain as a constant dull pain. LUQ is tender when pressure is applied.   Rated pain at a 2 or 3/10 if she is not applying pressure.   Denied pain radiating anywhere else.   She denied vomiting but has constantly felt nauseous despite taking anti-nausea meds Zofran .     She stated that she has had diarrhea since 08/27/23.   She stated that stools seems to be constant and uncontrolled.   She has to change pad/adult brief 6 times per day due to stools.    She stated that stools are sometimes green/yellow and appear to have mucous. She stated that stools also look oily with a foul odor.     She just feels fatigue/exhausted all the time.   She did reach out to GI yesterday for a follow up appt and is still waiting for a call back.   Patient has Gastro consult while inpatient but I do not see a GI referral for outpatient, which could possibly be the delay ?   Referral was placed to IR for possible drain.     I advised I will send to care team for further advice at this time.     Woodard Haymaker, RN

## 2023-09-01 NOTE — Telephone Encounter (Signed)
 This RN called and spoke with patient. I advised per Oneita Bihari PA  Referral have been placed to see a GI provider, Nephrologist and liver doc.   Patient was provided the number to contact to schedule consults. She stated that she did have a follow up with PCP after discharge and she is the one to suggest she needed to see specialist.     Patient instructed to stay hydrated and report dizziness or signs of dehydration.  Patient verbalized understanding.       Woodard Haymaker, RN

## 2023-09-01 NOTE — Telephone Encounter (Signed)
 Patient called back in to schedule with Dr Carey Chapman. Patient states this a follow up from and ED visit. I am unable to pull a schedule for this provider. Patient states she needs an urgent follow-up due to not feeling well. Patient states antibiotic is now gone. Please advise. [phone]

## 2023-09-03 NOTE — Telephone Encounter (Addendum)
 Patient notes that she is starting to feel better today but was worried previously because she had not yet started to feel better and had completed her course of antibiotics. Patient now feels she is OK to wait for next available follow up. Patient recently hospitalized with diagnosis noting hepatic cystic lesions and recommended to follow up as outpatient. RN advised pt that Dr. Carey Chapman does not currently have an outpatient clinic and upon further discussion with hepatology RN that she would be appropriate to follow up with hepatology CNP for ongoing management. Patient agreeable to this and has been scheduled on 09/08/2023.   Patient advised that should she start to experience worsening symptoms before she can be seen in office that she should be seen in the ED.

## 2023-09-08 ENCOUNTER — Ambulatory Visit: Admit: 2023-09-08 | Payer: MEDICARE

## 2023-09-08 ENCOUNTER — Ambulatory Visit: Admit: 2023-09-08 | Discharge: 2023-09-08 | Payer: MEDICARE | Attending: Family

## 2023-09-08 DIAGNOSIS — K769 Liver disease, unspecified: Secondary | ICD-10-CM

## 2023-09-08 DIAGNOSIS — R197 Diarrhea, unspecified: Secondary | ICD-10-CM

## 2023-09-08 LAB — RENAL FUNCTION PANEL W/EGFR
Albumin: 4.2 g/dL (ref 3.5–5.7)
Anion Gap: 9 mmol/L (ref 3–16)
BUN: 23 mg/dL (ref 7–25)
CO2: 26 mmol/L (ref 21–33)
Calcium: 9 mg/dL (ref 8.6–10.3)
Chloride: 107 mmol/L (ref 98–110)
Creatinine: 1.31 mg/dL — ABNORMAL HIGH (ref 0.60–1.30)
EGFR: 43
Glucose: 78 mg/dL (ref 70–100)
Osmolality, Calculated: 297 mosm/kg (ref 278–305)
Phosphorus: 3.5 mg/dL (ref 2.1–4.5)
Potassium: 4.9 mmol/L (ref 3.5–5.3)
Sodium: 142 mmol/L (ref 133–146)

## 2023-09-08 LAB — PROTIME-INR
INR: 0.9 (ref 0.9–1.1)
Protime: 12.9 s (ref 12.1–15.1)

## 2023-09-08 LAB — AMYLASE: Amylase: 51 U/L (ref 16–117)

## 2023-09-08 LAB — DIFFERENTIAL
Basophils Absolute: 63 /uL (ref 0–108)
Basophils Relative: 0.7 % (ref 0.0–1.0)
Eosinophils Absolute: 90 /uL (ref 0–864)
Eosinophils Relative: 1 % (ref 0.0–8.0)
Lymphocytes Absolute: 2520 /uL (ref 570–4860)
Lymphocytes Relative: 28 % (ref 15.0–45.0)
Monocytes Absolute: 819 /uL (ref 0–1296)
Monocytes Relative: 9.1 % (ref 0.0–12.0)
Neutrophils Absolute: 5508 /uL (ref 1520–8640)
Neutrophils Relative: 61.2 % (ref 40.0–80.0)
nRBC: 0 /100{WBCs} (ref 0–0)

## 2023-09-08 LAB — CBC
Hematocrit: 39.6 % (ref 35.0–45.0)
Hemoglobin: 13.1 g/dL (ref 11.7–15.5)
MCH: 30.5 pg (ref 27.0–33.0)
MCHC: 33.1 g/dL (ref 32.0–36.0)
MCV: 92.1 fL (ref 80.0–100.0)
MPV: 9.9 fL (ref 7.5–11.5)
Platelets: 206 10*3/uL (ref 140–400)
RBC: 4.3 10*6/uL (ref 3.80–5.10)
RDW: 14 % (ref 11.0–15.0)
WBC: 9 10*3/uL (ref 3.8–10.8)

## 2023-09-08 LAB — HEPATIC FUNCTION PANEL, SERUM
ALT: 22 U/L (ref 7–52)
AST (SGOT): 26 U/L (ref 13–39)
Albumin: 4.2 g/dL (ref 3.5–5.7)
Alkaline Phosphatase: 58 U/L (ref 36–125)
Bilirubin, Direct: 0.1 mg/dL (ref 0.00–0.40)
Bilirubin, Indirect: 0.4 mg/dL (ref 0.00–1.10)
Total Bilirubin: 0.5 mg/dL (ref 0.0–1.5)
Total Protein: 6.7 g/dL (ref 6.4–8.9)

## 2023-09-08 LAB — LIPASE: Lipase: 33 U/L (ref 4–82)

## 2023-09-08 NOTE — Patient Instructions (Signed)
 Labs today  Please complete the abdominal CT [phone]  Will refer to GI to evaluate your pancreatic insufficiency.  Follow up in 2 weeks.

## 2023-09-08 NOTE — Progress Notes (Signed)
 Chief Complaint   Patient presents with    New Patient Visit/ Consultation    Hepatic cyst        History of Present Illness:  Joanna Lopez is a 73 y.o. female with multilobulated or cystic lesions of the liver on imaging who presents for an initial consultation for evaluation and treatment.  Referred by PA Oneita Bihari.    PMH lung cancer (s/p VATS, chemo) 2023, HTN, CKD stage III, pancreatic insufficiency    Patient was recently admitted to [facility] for complaints of new and worsening left upper quadrant pain over the last 6 weeks.  With leukocytosis, AKI.  At that time she had a CT abdomen that showed mild inflammatory changes adjacent to multilobulated septated left hepatic cystic lesion that were nonspecific and possibly representative of infection or inflammation versus recent rupture.  She was treated with antibiotics (Rocephin , Flagyl ) and discharged on Cipro /Flagyl  for 5 days.  Interventional radiology recommended outpatient follow-up and possible drainage if indicated. Liver enzymes are normal (ALP 59, AST 21, ALT 15, t bili 0.5), WBC down from 16.6 to 8.7 on discharge.     Today she has concerns of since discharge until yesterday she was having  pasty brown/black, oily stool with no control for 4-5 days after discharge from the hospital with some nausea and LUQ pain. She has a history of pancreatic insufficiency and took some Creon  which  Stools now resolved since taking Creon . The last time she had issues with the pancreas was about 2 years ago. Does not see a doctor for this. She was having upper left abdominal pain that has been improving since taking the creon . Antibiotics completed on 08/31/2023. Never had fevers and none presently. Very fatigued and poor appetite, had nausea when symptoms first started and continues to have some nausea although it has improved.  Drinks alcohol rarely on special occasions. Her half sister is listed for liver-kidney transplant unsure of cause. No  alcohol or autoimmune disease or liver cancers in the family. [place] On a visit prior to 2021 she did swim in the lake. No history of IV or intranasal drug use. Denies jaundice, dark urine, light stool, confusion, fever, abdominal pain, hematochezia, hematemesis, melena, edema.       Testing:   -CT abdomen and pelvis 08/25/2023: Multiple hepatic cystic lesions including multilobulated lesion in the left hepatic lobe measuring 5 x 7.4 cm.  This lesion is slightly increased from 3.7 x 6.7 cm on 12/29/2016.  There are suspected internal septations without discrete enhancing nodule identified.  There is adjacent fat stranding to this lesion, new from prior.  Additional scattered subcentimeter low-density lesions in the liver too small to characterize but are favored to represent small cysts or hemangiomas.  Nonspecific and may represent infection or inflammation of the cystic lesion versus recent rupture.     Review of Systems   The following portions of the patient history were reviewed and updated as appropriate: allergies, current medications, family, medical, surgical and social history and problem list.    A comprehensive Review of Systems was completed and was negative other than what has been noted in the HPI.    Past Medical History:  She has a past medical history of Anxiety, Basal cell cancer, Cataract, Hyperlipidemia, Hypertension, Lung nodule, OSA (obstructive sleep apnea), RLS (restless legs syndrome), and Stroke (CMS-HCC).    Medications:  Current Medications[1]    Allergies:  Penicillins, Adhesive, Formaldehyde analogues,  Nsaids (non-steroidal anti-inflammatory drug), Promethazine  hcl, Latex, and Sulfa (sulfonamide antibiotics)    Family History:  Her family history includes Diabetes in her son. She was adopted.    Past Surgical History:  She has a past surgical history that includes Appendectomy; submastoid gland removal; Uvulopalatopharyngoplasty;  Tonsillectomy; Colonoscopy (N/A, 01/23/2013); Flexible bronchoscopy w/ upper endoscopy (N/A, 09/17/2016); Thoracoscopy (Right, 11/23/2016); Loop Implant  Implantation (N/A, 04/07/2017); Loop Removal (Left, 04/09/2021); Cataract extraction, bilateral (Bilateral, 07/2021); thoracoscopy lobectomy video assisted (Left, 11/18/2021); and insertion catheter vascular access (N/A, 12/26/2021).    Social History:  She reports that she has never smoked. She has never used smokeless tobacco. She reports that she does not currently use alcohol. She reports that she does not use drugs.    The following portions of the patient's history were reviewed and updated as appropriate: allergies, current medications, past family history, past medical history, past social history, past surgical history, and problem list.    Review of Systems:  ROS    Vital Signs:  Blood pressure 118/70, pulse 72, resp. rate 16, height 5' 7 (1.702 m), weight 192 lb (87.1 kg), SpO2 (!) 8%.  Physical Exam  Vitals reviewed.   Constitutional:       General: She is not in acute distress.     Appearance: Normal appearance. She is well-developed. She is obese. She is not diaphoretic.   HENT:      Head: Normocephalic and atraumatic.   Eyes:      General: No scleral icterus.        Right eye: No discharge.         Left eye: No discharge.      Conjunctiva/sclera: Conjunctivae normal.   Neck:      Trachea: No tracheal deviation.   Cardiovascular:      Rate and Rhythm: Normal rate and regular rhythm.      Heart sounds: Normal heart sounds. No murmur heard.     No gallop.   Pulmonary:      Effort: Pulmonary effort is normal. No respiratory distress.      Breath sounds: Normal breath sounds. No stridor. No wheezing, rhonchi or rales.   Abdominal:      General: Abdomen is flat. Bowel sounds are normal. There is no distension.      Palpations: Abdomen is soft. There is no mass.      Tenderness: There is no abdominal tenderness. There is no guarding or rebound.      Hernia:  No hernia is present.      Comments: No dullness to flanks. Liver edge palpable on inspiration.   Musculoskeletal:      Right lower leg: No edema.      Left lower leg: No edema.   Lymphadenopathy:      Cervical: No cervical adenopathy.   Skin:     General: Skin is warm and dry.      Comments: No palmar erythema, jaundice or spider angiomata.    Neurological:      Mental Status: She is alert and oriented to person, place, and time.      Comments: No asterixis or tremors.    Psychiatric:         Behavior: Behavior normal.         Thought Content: Thought content normal.         Judgment: Judgment normal.       Labs and imaging reviewed    Review of Lab Results:  Lab Results   Component  Value Date    WBC 8.7 08/26/2023    HGB 12.6 08/26/2023    HGB 12.1 11/18/2021    HCT 37.6 08/26/2023    HCT 40.5 11/23/2016    MCV 93.4 08/26/2023    PLT 135 (L) 08/26/2023    CREATININE 1.16 08/26/2023    BUN 21 08/26/2023    NA 142 08/26/2023    K 4.8 08/26/2023    CL 109 08/26/2023    CO2 27 08/26/2023    ALT 15 08/26/2023    AST 17 08/26/2023    ALKPHOS 59 08/26/2023    BILITOT 0.5 08/26/2023       Prior Diagnostic Testing:   CT: Abdomen and pelvis 08/25/2023  ABDOMEN AND PELVIS   Mild inflammatory changes adjacent to a multilobulated septated left hepatic cystic lesion; nonspecific and may represent infection or inflammation of the cystic lesion versus recent rupture.        Assessment & Plan:  Mahak Lebeda is a 73 y.o. female with multilobulated or cystic lesions of the liver on imaging who presents for an initial consultation for evaluation and treatment.  Referred by PA Oneita Bihari.    Liver disease: normal liver tests. Multiple cystic lesions in the liver.  Cystic lesions: CT concerning for inflammation or infection. Emperically treated with antibiotics. With improvement in symptoms and leukocytosis. Will discuss CT in HB conference and repeat triphasic CT abdomen. Based on CT will consider referral to infectious  disease to rule out tropical disease processes.   History pancreatic insufficiency: continue creon  and referred to GI pancreatic specialist.   Labs as ordered.  Follow up in 2 weeks.        Medical Decision Making:  The following items were considered in medical decision making:  Permanent chart problem/surgery list reviewed  Permanent chart chronic medication/allergy list reviewed  Permanent chart social/family history reviewed  Review/order clinical lab tests  Review/order radiology tests  Review/order other diagnostic or treatment interventions  Rev iew old records    I spent 27 minutes face to face with this patient with greater than 50% of this time spent in counseling and coordination of care discussing items in the Assessment and Plan above. I also spent 23 minutes on the same day as the encounter performing additional activities related to this service. This time excludes time spent on any separately billed services.            [1]   Current Outpatient Medications:     aspirin  325 MG tablet, Take 1 tablet (325 mg total) by mouth daily., Disp: 30 tablet, Rfl: 0    atorvastatin  (LIPITOR ) 80 MG tablet, Take 1 tablet (80 mg total) by mouth every morning., Disp: , Rfl:     BIOTIN ORAL, Take 1 tablet by mouth daily., Disp: , Rfl:     busPIRone  (BUSPAR ) 10 MG tablet, Take 1 tablet (10 mg total) by mouth in the morning and at bedtime., Disp: , Rfl:     calcium -vitamin D  (OSCAL-500 + D) 500 mg-5 mcg (200 unit) per tablet, Take 1 tablet by mouth daily., Disp: , Rfl:     gabapentin  (NEURONTIN ) 300 MG capsule, Take 1 capsule (300 mg total) by mouth 3 times a day., Disp: 90 capsule, Rfl: 1    leucovorin-pyridox-mecobalamin (FOLINIC-PLUS) 4-50-2 mg Tab, Take 1 tablet by mouth daily., Disp: , Rfl:     lipase /protease /amylase  (CREON  ORAL), Take 1 capsule by mouth 3 times a day as needed (for abdominal cramping). Take prior to meals, Disp: ,  Rfl:     lisinopriL  (PRINIVIL ) 10 MG tablet, Take 1 tablet (10 mg total) by mouth  daily., Disp: , Rfl:     loperamide  (IMODIUM ) 2 mg capsule, Take 1-2 capsules (2-4 mg total) by mouth 4 times a day as needed for Diarrhea. Take 4 mg (2 capsules) after the first loose stool and then 1 capsule for each loose stool thereafter, Disp: , Rfl:     MELATONIN ORAL, Take 10 mg by mouth at bedtime as needed (sleep)., Disp: , Rfl:     multivitamin (THERAGRAN) tablet, Take 1 tablet by mouth daily., Disp: , Rfl:     ondansetron  (ZOFRAN ) 8 MG tablet, Take 1 tablet (8 mg total) by mouth every 8 hours as needed for Nausea., Disp: 30 tablet, Rfl: 3    PARoxetine  (PAXIL ) 20 MG tablet, Take 1 tablet (20 mg total) by mouth at bedtime., Disp: , Rfl:     polyethylene glycol (MIRALAX ) 17 gram/dose powder, Take 17 g by mouth daily as needed (constipation)., Disp: , Rfl:     roPINIRole  (REQUIP ) 1 MG tablet, Take 1 tablet (1 mg total) by mouth at bedtime. Indications: Restless Legs Syndrome, Disp: , Rfl:

## 2023-09-09 NOTE — Progress Notes (Signed)
 Multi-disciplinary Hepatobiliary Case Conference Review:    Joanna Lopez is a 73 y.o.female reviewed today at the Hepatobiliary Multidisciplinary Tumor Board. The patient's history, imaging and pathology were reviewed today in the presence of representatives from Transplant Surgery, Surgical Oncology, Medical Oncology, Radiation Oncology, Diagnostic Radiology, Interventional Radiology and Pathology.     Diagnosis: Multilobulated, septated hepatic cyst with inflammatory changes    Case Presented by Elgie Grist.  Reviewed the following imaging:  CT ABD 08/25/23.    Review CT, discussion of need for drainage or other intervention.    The recommendations from this multidisciplinary discussion was for:      Compared imaging to 2018. Slight increase in size. Follow up CT scheduled.      *This document represents the recommendations of the practitioners in attendance as of the date of this conference. These recommendations do not imply a patient consultation, nor do they dictate an agreed treatment plan, but rather are to be used as a resource for practitioners when determining the final treatment plan. The responsibility for the final treatment plan and the ultimate standard care remains with the treating physicians.

## 2023-09-13 ENCOUNTER — Inpatient Hospital Stay: Admit: 2023-09-13 | Discharge: 2023-09-17 | Payer: MEDICARE | Attending: Family

## 2023-09-13 ENCOUNTER — Inpatient Hospital Stay: Admit: 2023-09-13 | Discharge: 2023-09-17 | Payer: MEDICARE

## 2023-09-13 DIAGNOSIS — C3412 Malignant neoplasm of upper lobe, left bronchus or lung: Secondary | ICD-10-CM

## 2023-09-13 DIAGNOSIS — K769 Liver disease, unspecified: Secondary | ICD-10-CM

## 2023-09-13 MED ORDER — OMNIPAQUE (iohexol) 350 mg iodine/mL 125 mL
350 | Freq: Once | INTRAVENOUS | Status: AC | PRN
Start: 2023-09-13 — End: 2023-09-13
  Administered 2023-09-13: 12:00:00 via INTRAVENOUS

## 2023-09-14 ENCOUNTER — Other Ambulatory Visit: Admit: 2023-09-14 | Payer: MEDICARE

## 2023-09-14 DIAGNOSIS — R112 Nausea with vomiting, unspecified: Secondary | ICD-10-CM

## 2023-09-14 LAB — DIFFERENTIAL
Basophils Absolute: 52 /uL (ref 0–108)
Basophils Relative: 0.5 % (ref 0.0–1.0)
Eosinophils Absolute: 94 /uL (ref 0–864)
Eosinophils Relative: 0.9 % (ref 0.0–8.0)
Lymphocytes Absolute: 1737 /uL (ref 570–4860)
Lymphocytes Relative: 16.7 % (ref 15.0–45.0)
Monocytes Absolute: 1154 /uL (ref 0–1296)
Monocytes Relative: 11.1 % (ref 0.0–12.0)
Neutrophils Absolute: 7363 /uL (ref 1520–8640)
Neutrophils Relative: 70.8 % (ref 40.0–80.0)
nRBC: 0 /100{WBCs} (ref 0–0)

## 2023-09-14 LAB — RENAL FUNCTION PANEL W/EGFR
Albumin: 4.1 g/dL (ref 3.5–5.7)
Anion Gap: 10 mmol/L (ref 3–16)
BUN: 15 mg/dL (ref 7–25)
CO2: 24 mmol/L (ref 21–33)
Calcium: 9.1 mg/dL (ref 8.6–10.3)
Chloride: 106 mmol/L (ref 98–110)
Creatinine: 1.3 mg/dL (ref 0.60–1.30)
EGFR: 43
Glucose: 93 mg/dL (ref 70–100)
Osmolality, Calculated: 291 mosm/kg (ref 278–305)
Phosphorus: 3.7 mg/dL (ref 2.1–4.5)
Potassium: 4.8 mmol/L (ref 3.5–5.3)
Sodium: 140 mmol/L (ref 133–146)

## 2023-09-14 LAB — CBC
Hematocrit: 39.7 % (ref 35.0–45.0)
Hemoglobin: 13.6 g/dL (ref 11.7–15.5)
MCH: 31 pg (ref 27.0–33.0)
MCHC: 34.2 g/dL (ref 32.0–36.0)
MCV: 90.5 fL (ref 80.0–100.0)
MPV: 10.5 fL (ref 7.5–11.5)
Platelets: 180 10*3/uL (ref 140–400)
RBC: 4.39 10*6/uL (ref 3.80–5.10)
RDW: 14.1 % (ref 11.0–15.0)
WBC: 10.4 10*3/uL (ref 3.8–10.8)

## 2023-09-14 LAB — HEPATIC FUNCTION PANEL, SERUM
ALT: 16 U/L (ref 7–52)
AST (SGOT): 23 U/L (ref 13–39)
Albumin: 4.1 g/dL (ref 3.5–5.7)
Alkaline Phosphatase: 71 U/L (ref 36–125)
Bilirubin, Direct: 0.21 mg/dL (ref 0.00–0.40)
Bilirubin, Indirect: 0.89 mg/dL (ref 0.00–1.10)
Total Bilirubin: 1.1 mg/dL (ref 0.0–1.5)
Total Protein: 6.7 g/dL (ref 6.4–8.9)

## 2023-09-14 MED ORDER — ondansetron (ZOFRAN) 8 MG tablet
8 | ORAL_TABLET | Freq: Three times a day (TID) | ORAL | 3 refills | 7.00000 days | Status: AC | PRN
Start: 2023-09-14 — End: ?

## 2023-09-14 NOTE — Telephone Encounter (Signed)
 Discussed repeat CT abdomen with patient.  There has been mildly decreased fat stranding adjacent to the cyst and no enhancing nodules.  There is also a new cystic focus abutting the septated cystic lesion with mild thickening of the cyst wall and mild adjacent hyperemia that suggest a possible abscess.    She reports constant nausea, fatigue/malaise, but no fevers. Appetite is poor, eating makes the nausea worse and has not been having much oral intake.  No vomiting to this point.  Denies abdominal pain.     1.   Refilled Zofran  as it helps some with the nausea but does not completely resolve.  2.  Will repeat liver function testing including CBC with differential.    3.  Plan to discuss in HB conference for next steps/need for training.    4.  Will discuss with Dr. Genene Kennel to assess ability to wait for HB discussion versus sooner need for intervention.

## 2023-09-16 NOTE — Progress Notes (Signed)
 Multi-disciplinary Hepatobiliary Case Conference Review:    Joanna Lopez is a 73 y.o.female reviewed today at the Hepatobiliary Multidisciplinary Tumor Board. The patient's history, imaging and pathology were reviewed today in the presence of representatives from Transplant Surgery, Surgical Oncology, Medical Oncology, Radiation Oncology, Diagnostic Radiology, Interventional Radiology and Pathology.     Diagnosis: Liver Cyst/abscess    Case Presented by NP Deeann Fare  Reviewed the following imaging:  CT ABD/Pelvis(09-13-23).    Interval development of a new thick-walled cystic lesion medially in segment 3 consistent with an evolving abscess.     The recommendations from this multidisciplinary discussion was for:      Infectious disease consult  Interventional radiology consult      *This document represents the recommendations of the practitioners in attendance as of the date of this conference. These recommendations do not imply a patient consultation, nor do they dictate an agreed treatment plan, but rather are to be used as a resource for practitioners when determining the final treatment plan. The responsibility for the final treatment plan and the ultimate standard care remains with the treating physicians.

## 2023-09-16 NOTE — Telephone Encounter (Signed)
 Discussed abdominal CT with patient. Discussion in HB conference with likely evolving abscess. Recommendation to refer to infectious Disease and touch base with IR. Messaged IR (Dr. Roosevelt Coles and Dr. Jeane Miguel).  Recent labs with WBC 10 with an upward trend.       Diarrhea started 2 days ago 4 liquid  stools a day, light brown in color. She continues to have constant nausea and now belching that is very frequent throughout the day. Fatigue is worsening. Denies fever or abdominal pain.     She will get labs and stool studies (orders placed). Referral to ID sent.     Low threshold for return to ED if symptoms worsen.

## 2023-09-17 ENCOUNTER — Other Ambulatory Visit: Admit: 2023-09-17 | Payer: MEDICARE

## 2023-09-17 ENCOUNTER — Ambulatory Visit: Admit: 2023-09-17 | Discharge: 2023-09-17 | Payer: MEDICARE

## 2023-09-17 ENCOUNTER — Ambulatory Visit: Payer: MEDICARE

## 2023-09-17 DIAGNOSIS — C3412 Malignant neoplasm of upper lobe, left bronchus or lung: Secondary | ICD-10-CM

## 2023-09-17 DIAGNOSIS — K7689 Other specified diseases of liver: Secondary | ICD-10-CM

## 2023-09-17 DIAGNOSIS — K769 Liver disease, unspecified: Secondary | ICD-10-CM

## 2023-09-17 LAB — GIARDIA CRYPTOSPORIDIUM ANTIGENS
Cryptosporidium Ag: NEGATIVE
Giardia Ag: NEGATIVE

## 2023-09-17 LAB — MISCELLANEOUS REFERENCE TEST
Test Name: 138768
Test Name: 6874

## 2023-09-17 LAB — DIFFERENTIAL
Basophils Absolute: 121 /uL — ABNORMAL HIGH (ref 0–108)
Basophils Relative: 1.1 % — ABNORMAL HIGH (ref 0.0–1.0)
Eosinophils Absolute: 55 /uL (ref 0–864)
Eosinophils Relative: 0.5 % (ref 0.0–8.0)
Lymphocytes Absolute: 1881 /uL (ref 570–4860)
Lymphocytes Relative: 17.1 % (ref 15.0–45.0)
Monocytes Absolute: 671 /uL (ref 0–1296)
Monocytes Relative: 6.1 % (ref 0.0–12.0)
Neutrophils Absolute: 8272 /uL (ref 1520–8640)
Neutrophils Relative: 75.2 % (ref 40.0–80.0)
nRBC: 0 /100{WBCs} (ref 0–0)

## 2023-09-17 LAB — COMPREHENSIVE METABOLIC PANEL
ALT: 16 U/L (ref 7–52)
AST: 22 U/L (ref 13–39)
Albumin: 3.9 g/dL (ref 3.5–5.7)
Alkaline Phosphatase: 63 U/L (ref 36–125)
Anion Gap: 10 mmol/L (ref 3–16)
BUN: 32 mg/dL — ABNORMAL HIGH (ref 7–25)
CO2: 22 mmol/L (ref 21–33)
Calcium: 9 mg/dL (ref 8.6–10.3)
Chloride: 108 mmol/L (ref 98–110)
Creatinine: 1.44 mg/dL — ABNORMAL HIGH (ref 0.60–1.30)
EGFR: 38
Glucose: 153 mg/dL — ABNORMAL HIGH (ref 70–100)
Osmolality, Calculated: 300 mosm/kg (ref 278–305)
Potassium: 4.3 mmol/L (ref 3.5–5.3)
Sodium: 140 mmol/L (ref 133–146)
Total Bilirubin: 0.6 mg/dL (ref 0.0–1.5)
Total Protein: 6.5 g/dL (ref 6.4–8.9)

## 2023-09-17 LAB — CBC
Hematocrit: 38.4 % (ref 35.0–45.0)
Hemoglobin: 13.3 g/dL (ref 11.7–15.5)
MCH: 31 pg (ref 27.0–33.0)
MCHC: 34.7 g/dL (ref 32.0–36.0)
MCV: 89.4 fL (ref 80.0–100.0)
MPV: 11 fL (ref 7.5–11.5)
Platelets: 164 10*3/uL (ref 140–400)
RBC: 4.29 10*6/uL (ref 3.80–5.10)
RDW: 13.9 % (ref 11.0–15.0)
WBC: 11 10*3/uL — ABNORMAL HIGH (ref 3.8–10.8)

## 2023-09-17 LAB — HEPATIC FUNCTION PANEL, SERUM
ALT: 16 U/L (ref 7–52)
AST (SGOT): 21 U/L (ref 13–39)
Albumin: 3.9 g/dL (ref 3.5–5.7)
Alkaline Phosphatase: 62 U/L (ref 36–125)
Bilirubin, Direct: 0.12 mg/dL (ref 0.00–0.40)
Bilirubin, Indirect: 0.48 mg/dL (ref 0.00–1.10)
Total Bilirubin: 0.6 mg/dL (ref 0.0–1.5)
Total Protein: 6.4 g/dL (ref 6.4–8.9)

## 2023-09-17 LAB — OVA AND PARASITE COMPREHENSIVE W/ GIARDIA/CRYPTO

## 2023-09-17 LAB — BLOOD CULTURE-PERIPHERAL: Culture Result: NO GROWTH

## 2023-09-17 LAB — MAGNESIUM: Magnesium: 1.7 mg/dL (ref 1.5–2.5)

## 2023-09-17 MED ORDER — ciprofloxacin HCl (CIPRO) 500 MG tablet
500 | ORAL_TABLET | Freq: Two times a day (BID) | ORAL | 0 refills
Start: 2023-09-17 — End: 2023-09-17

## 2023-09-17 MED ORDER — cefdinir (OMNICEF) 300 MG capsule
300 | ORAL_CAPSULE | Freq: Two times a day (BID) | ORAL | 0 refills | 7.00000 days | Status: AC
Start: 2023-09-17 — End: 2023-10-08

## 2023-09-17 MED ORDER — metroNIDAZOLE (FLAGYL) 500 MG tablet
500 | ORAL_TABLET | Freq: Three times a day (TID) | ORAL | 0 refills | 7.00000 days | Status: AC
Start: 2023-09-17 — End: 2023-10-08

## 2023-09-17 NOTE — Patient Instructions (Addendum)
 I'm considering tropical causes of the liver cysts like echinococcus or entamoeba histolytica because you had the liver cysts in 05/2010, which is AFTER your first trip to [place] 11[place]2009.  It's worth doing blood tests to see, but these blood tests are not perfect. I may need to do another round of blood tests after these.   If you have one of these infections, we'll treat you with antiparasitics, but you would need surgery too.   I'd like you to speak with a Careers adviser. For appointments, please call [phone].  I need three different stool samples.   Downstairs for blood draws.

## 2023-09-17 NOTE — Progress Notes (Signed)
 ID Consult Note    Consulting physician: chandler    Referral for: liver cysts    Chief Complaint   Patient presents with    ID Consult Visit (New)       HPI:  Joanna Lopez is a 73 y.o. lady with a history of metastatic lung cancer treated in 2023 with chemotherapy, presenting with LUQ pain. Starting 08/2023, she developed LUQ pain slowly, it came and went for a few weeks before eventually becoming constant and getting worse, 08/25/2023 came to the hospital. She was told she had an infected liver cyst, IV antibiotics helped her feel better after a few days. She was told they needed to remove the infected cyst, but IR declined the procedure, so she went home with oral antibiotics. She took her cipro  and flagyl  for 5d, and developed severe pain in her heels where she felt like they might snap. She finished the antibiotics though and the abd pain was gone, but she still didn't feel well, still wasn't her usual self. Then the pain started coming back a few days after finishing the antibiotics, and it still hurts. The LUQ is accompanied by nausea without vomiting, and she's had diarrhea on and off this whole time, but not any diarrhea before 08/2023. She's never had any serious infections before. Detailed travel and exposure history documented below.     ROS:  Review of Systems   Constitutional:  Positive for chills and malaise/fatigue. Negative for fever.   HENT:  Negative for congestion and sore throat.    Respiratory:  Positive for shortness of breath (pain in abd worse with deep breathing). Negative for cough.    Cardiovascular:  Negative for chest pain and leg swelling.   Gastrointestinal:  Positive for abdominal pain (LUQ), diarrhea (4-5x per day, watery to loose) and nausea. Negative for blood in stool, constipation and vomiting.        Nausea, so doesn't want to eat,   Genitourinary:  Negative for dysuria and hematuria.   Musculoskeletal:  Positive for back pain (sometimes, stable, chroni). Negative for  joint pain and myalgias.   Skin:  Negative for itching and rash.   Neurological:  Positive for dizziness (if she walks around she feels faint, losing her balance too) and headaches (few, mild). Negative for loss of consciousness (but nearly a few times while excerting herself).   Psychiatric/Behavioral:  Negative for depression. The patient is not nervous/anxious and does not have insomnia.      Past Medical History:   Diagnosis Date    Anxiety     Basal cell cancer     Cataract     Hyperlipidemia     Hypertension     Lung nodule     OSA (obstructive sleep apnea)     RLS (restless legs syndrome)     Stroke (CMS-HCC)     5 yrs ago, TIA X 2     Past Surgical History:   Procedure Laterality Date    APPENDECTOMY      CATARACT EXTRACTION, BILATERAL Bilateral 07/2021    COLONOSCOPY N/A 01/23/2013    Procedure: COLONOSCOPY WITH MAC;  Surgeon: Francoise Ishihara, MD;  Location: [facility] ENDOSCOPY;  Service: Gastroenterology;  Laterality: N/A;    FLEXIBLE BRONCHOSCOPY W/ UPPER ENDOSCOPY N/A 09/17/2016    Procedure: BRONCHOSCOPY SUPER D WITH FLUORO AND BIOPSY-EBUS/NAVI;  Surgeon: Sadia Benzaquen, MD;  Location: [facility] ENDOSCOPY;  Service: Pulmonary;  Laterality: N/A;    INSERTION CATHETER VASCULAR ACCESS N/A 12/26/2021  Procedure: INSERTION POWER PORT A CATH;  Surgeon: Elnor Hail, MD;  Location: [facility] OR;  Service: General;  Laterality: N/A;    LOOP IMPLANT IMPLANTATION N/A 04/07/2017    Procedure: Loop  Implantation;  Surgeon: Chandler Combs, MD;  Location: [facility] ELECTROPHYSIOLOGY LAB;  Service: Electrophysiology;  Laterality: N/A;    LOOP REMOVAL Left 04/09/2021    Procedure: Loop Removal;  Surgeon: Leo Rainbow, MD;  Location: [facility] MIS SUITE;  Service: Electrophysiology;  Laterality: Left;    submastoid gland removal      THORACOSCOPY Right 11/23/2016    Procedure: RIGHT SIDED VATS MIDDLE LOBE LOBECTOMY,HILAR MEDIASTINAL LYMPH NODE DISSECTION;  Surgeon: Crist Dominion, MD;  Location: [facility] OR;  Service: Thoracic;  Laterality:  Right;    THORACOSCOPY LOBECTOMY VIDEO ASSISTED Left 11/18/2021    Procedure: Left Thoracoscopic Lingulectomy;  Surgeon: Crist Dominion, MD;  Location: [facility] OR;  Service: General;  Laterality: Left;    TONSILLECTOMY      UVULOPALATOPHARYNGOPLASTY         Family History   Adopted: Yes   Problem Relation Age of Onset    Diabetes Son        Social History     Socioeconomic History    Marital status: Widowed     Spouse name: Not on file    Number of children: Not on file    Years of education: Not on file    Highest education level: Not on file   Occupational History    Not on file   Tobacco Use    Smoking status: Never    Smokeless tobacco: Never   Vaping Use    Vaping status: Never Used   Substance and Sexual Activity    Alcohol use: Not Currently     Comment: occasionally    Drug use: No    Sexual activity: Not Currently     Partners: Male   Other Topics Concern    Caffeine Use No     Comment: 1 can coke/day    Occupational Exposure Yes     Comment: Geographical information systems officer    Exercise No    Seat Belt Yes   Social History Narrative    [place] , also had lived in [place]; [place] in the [place]'s; [place], [place]; [employer]; and here in [place]    She's travelled to [place] 11[place]2009, and then again 2022, [place], with a missionary, swimming in fresh water  rivers and ocean, eating local foods, street foods.    [place], [place] and also around the [place] 2017 or so.     Cruises to [place], [place]  last year.                   Social Drivers of Psychologist, prison and probation services Strain: Not on file   Food Insecurity: No Food Insecurity (08/25/2023)    Hunger Vital Sign     Worried About Running Out of Food in the Last Year: Never true     Ran Out of Food in the Last Year: Never true   Transportation Needs: No Transportation Needs (08/25/2023)    PRAPARE - Therapist, art (Medical): No     Lack of Transportation (Non-Medical): No   Physical  Activity: Not on file   Stress: Not on file   Social Connections: Not on file   Intimate Partner Violence: Not At Risk (08/25/2023)  Humiliation, Afraid, Rape, and Kick questionnaire     Fear of Current or Ex-Partner: No     Emotionally Abused: No     Physically Abused: No     Sexually Abused: No   Housing Stability: Low Risk  (08/25/2023)    Housing Stability Vital Sign     Unable to Pay for Housing in the Last Year: No     Number of Times Moved in the Last Year: 0     Homeless in the Last Year: No       Current Outpatient Medications   Medication Instructions    aspirin  325 mg, Oral, Daily    atorvastatin  (LIPITOR ) 80 mg, Every morning    BIOTIN ORAL 1 tablet, Daily    busPIRone  (BUSPAR ) 10 mg, 2 times daily    calcium -vitamin D  (OSCAL-500 + D) 500 mg-5 mcg (200 unit) per tablet 1 tablet, Daily    gabapentin  (NEURONTIN ) 300 mg, Oral, 3 times daily    leucovorin-pyridox-mecobalamin (FOLINIC-PLUS) 4-50-2 mg Tab 1 tablet, Daily    lipase /protease /amylase  (CREON  ORAL) 1 capsule, 3 times daily PRN    lisinopriL  (PRINIVIL ) 10 mg, Daily    loperamide  (IMODIUM ) 2-4 mg, 4 times daily PRN    MELATONIN ORAL 10 mg, Nightly PRN    multivitamin (THERAGRAN) tablet 1 tablet, Daily    ondansetron  (ZOFRAN ) 8 mg, Oral, Every 8 hours PRN    PARoxetine  (PAXIL ) 20 MG tablet 1 tablet, At Bedtime (2100)    polyethylene glycol (MIRALAX ) 17 g, Daily as needed    roPINIRole  (REQUIP ) 1 mg, At Bedtime (2100)      Vitals:  Vitals:    09/17/23 0941   BP: 107/68   Pulse: 97   Resp: 16   Temp: 96.3 F (35.7 C)   SpO2: 98%     Physical exam:  Gen: A&Ox4, well appearing, comfortable, overweight  HEENT: MMM, EOMI, s/p cataracts surgery  Cardio: RRR, no m/r/g  Resp: CTAB  Abd: moderate LUQ tenderness, no murphy's sign, liver edge palpable, ND, spleen not palpated. No rebound, no guarding.  Ext: no pretibial edema, no cyanosis  Skin: no lesions, no rashes  Neuro: walks unassisted, no focal deficits  Psych: pleasant and cooperative  Lymph: no cervical  LAD    Antibiotics:  S/p cipro +flagyl  5d    Immunosupppression:  none    Labs: (Lab results reviewed, relevant results listed below)   09/14/23 12:48   Sodium 140   Potassium 4.8   Chloride 106   Carbon Dioxide (CO2) 24   Anion Gap 10   BUN 15   Creatinine 1.30   Glucose 93   EGFR 43   Calcium  9.1   Phosphorus 3.7   Albumin 4.1   Calculated Osmolality, Serum 291   Alkaline Phosphatase 71   AST (SGOT) 23   ALT (SGPT) 16   Albumin 4.1   Protein, Total 6.7   Bilirubin, Indirect 0.89   Bili, Total 1.1   Bilirubin, Direct 0.21   WBC 10.4   RBC 4.39   Hemoglobin 13.6   Hematocrit 39.7   MCV 90.5   MCH 31.0   MCHC 34.2   RDW 14.1   Platelet Count 180   MPV 10.5   Neutrophils Relative 70.8   Lymphocytes Relative 16.7   Monocytes Relative 11.1   Eosinophils Relative 0.9   Basophils Relative 0.5   nRBC 0   Neutrophils Absolute 7,363   Absolute Lymphocytes 1,737   Monocytes Absolute 1,154   Eosinophils Absolute  94   Basophils Absolute 52     Imaging: (imaging results reviewed, relevant results listed below)  05/2010 CT  1. Multiloculated cystic focus within the lateral segment of the left hepatic lobe left of midline showing complexity and infiltration of the adjacent mesenteric fat. Likely considerations include infected hepatic cysts or an infected collection inoculated by previous abdominal infection. Trauma now with if hemorrhage into the pre-existing cyst is also possible. Neoplasm could be considered but is considered less likely.   2014 PET In the liver there is an there are several small scattered lobe attenuation lesions, as well as a larger lobular fluid attenuation lesion measuring 5.1 x 3.3 cm within the left-most aspect of the left liver lobe. The larger lesion does not show FDG activity. Indeterminate lobular 5 x 3 cm cystic lesion in the left liver lobe.   2014 US :  Clustered cysts are present in the lateral segment of the left lobe of the liver measuring 3.7 x 2.6 x 4.8 cm in size with single cyst age measuring  2.2 x 1.6 cm. Small simple parotid cyst in the right lobe. Not all cysts on CAT scan are identified with the current ultrasound No ductal dilatation. No suspicious masses.   2015 CT Multiple hepatic cysts are noted as on prior PET imaging.   2018 PET HEPATOBILIARY:  No abnormal FDG uptake.  Liver background SUV mean, as a reference for FDG studies, is SUV  1.7. Multiple non-FDG avid hypoattenuating lesions in the liver, probably benign cysts.   2019 PET HEPATOBILIARY:  No abnormal FDG uptake.  Liver background SUV mean, as a reference for FDG studies, is SUV  2.0   08/2023 CT   LIVER: Multiple hepatic cystic lesions, including a multilobulated lesion in the left hepatic lobe measuring 5 x 7.4 cm (series 305 image 32). This lesion is slightly increased from 3.7 x 6.7 cm on 12/29/2016. There are suspected internal septations without discrete enhancing nodule identified. There is adjacent fat stranding to this lesion (series 305 image 33), new from prior. Additional scattered subcentimeter low-density lesions liver too small to characterize but also favored to represent small cysts or hemangiomas.     09/2023 CT  Liver: Normal morphology and attenuation.   *  There are multiple bilateral simple hepatic cysts. The largest is in segment 8 and measures 7.2 cm.   *  Arising from segment 3, there is an exophytic septated cystic lesion measuring 7.1 x 5 cm. (Series 306 image 126). This is not significantly changed in size since the recent comparison exam. The previously seen fat stranding adjacent to this cyst is mildly decreased. There are a few thin septations. No enhancing nodule.   *  There is a new cystic focus abutting the septated cystic lesion medially in segment 3 which measures 4.6 x 3.2 cm. There is mild thickening of the cyst wall. There is mild adjacent hyperemia best seen on the coronal images. (Series 605 image 40). (Series 306 image 122).   Impression  Smaller size of the multiseptated cystic lesion in segment 3  of the liver with decrease of the adjacent inflammatory change. There is interval development of a new thick-walled cystic lesion medially in segment 3 which may represent abscess.     Microbiology: (micro results reviewed, relevant results listed below)  none    Assessment and Plan:  Joanna Lopez is a 73 y.o. lady here with LUQ abd pain, chronic liver cysts, and acute liver abscess.  She travelled to [place] 11[place], [place], a few months later 05/2010 she presented to the ER for acute LUQ abd pain and diarrhea with findings of liver cysts on CT, largest in the left lobe, which have been followed for the last 59yrs over which time she was largely asymptomatic, before she again became symptomatic 08/2023 with LUQ pain due to mass effect and infection of a cyst which responded to short course of antibiotics, but she's still having the LUQ pain, worsening again off of antibiotics with new development of segment 3 liver abscess. This exposure history, latency, and clinical course is possible for echinococcus. There is no evidence for any possible echinococcus disease outside of the liver.     - restart cefdinir+flagyl  for liver abscess to be continued until clinical and radiographic resolution of the abscess. (Chosen over cipro  due to tendonitis, and augmentin due to anaphylaxis)  - referral to surgery for infected liver cysts and liver abscess, IR did not offer drainage.  - timing of next serial CT to be determined by clinical course and input of multidisciplinary physicians.  - [place] is not known to have echinococcus present, but the only surveillance done for echinococcus in the country was a limited study done 2014-2016, so if echinococcus is present in [place] when she travelled there, it may be an uncommon infection. There is a case report of echinococcus from the adjacent [place], and it is known to be endemic in [place], which is in the region as well. It is worth  screening for echinococcus serology in this case due to it's status as a neglected tropical disease whose exact distribution has not been well studied.   - send out for echinococcus and e. Histolytica serology. If positive, would need to be confirmed by additional testing.  - stool O&Px3  - if echinococcus is confirmed, Surgery rather than PAIR (percutaneous aspiration, injection of chemicals and reaspiration) would be indicated in her case due to the infected cyst with abscess, the distribution of the cyst clusters, and the size of the cysts.  - if echinococcus is confirmed, I'd have her take albendazole for 1w prior to definitive surgical management of the echinococcus cysts, continuing with albendazole thereafter for at least a month. Precautions must be taken during surgery to avoid rupturing the cysts due to risk of cyst dissemination and anaphylactic reaction with shock.     RTC 2-3w  Time spent on consult: 58m    Arlander Bellman MD  Infectious Diseases    ------------------------------  References:  Toney Francois, Mombo-Ngoma G, Mischlinger J, Groger M, Veletzky L, Adegnika AA, Lell B, Agnandji ST, Bouyou-Akotet M, Obermller M, Wassermann M, Schneider R, Auer H, Ramharter M. Preliminary Evidence for the Absence of Cystic Echinococcosis in [place]: A Press photographer Survey in Humans and Definitive Hosts. Am J Trop Med Hyg. 2018 Jul;99(1):97-101. doi: 10.4269/ajtmh.16-1096. Epub 2018 May 17. PMID: 04540981; PMCID: [id number].    Kathryn Parish., Ahmed, M.I., Adamu, N.B. et al. [place]-wide meta-analysis on the prevalence and distribution of human cystic echinococcosis and canine Echinococcus granulosus infections. Parasites Vectors 15, 357 (2022). [email or web address]

## 2023-09-20 ENCOUNTER — Other Ambulatory Visit: Admit: 2023-09-20 | Payer: MEDICARE

## 2023-09-20 DIAGNOSIS — K7689 Other specified diseases of liver: Secondary | ICD-10-CM

## 2023-09-20 LAB — OVA AND PARASITE COMPREHENSIVE W/ GIARDIA/CRYPTO

## 2023-09-20 LAB — GIARDIA CRYPTOSPORIDIUM ANTIGENS
Cryptosporidium Ag: NEGATIVE
Giardia Ag: NEGATIVE

## 2023-09-22 ENCOUNTER — Other Ambulatory Visit: Admit: 2023-09-22 | Payer: MEDICARE

## 2023-09-22 ENCOUNTER — Ambulatory Visit: Admit: 2023-09-22 | Discharge: 2023-09-26 | Payer: MEDICARE | Attending: Family

## 2023-09-22 DIAGNOSIS — K769 Liver disease, unspecified: Secondary | ICD-10-CM

## 2023-09-22 DIAGNOSIS — K7689 Other specified diseases of liver: Secondary | ICD-10-CM

## 2023-09-22 LAB — HEPATIC FUNCTION PANEL, SERUM
ALT: 18 U/L (ref 7–52)
AST (SGOT): 25 U/L (ref 13–39)
Albumin: 3.9 g/dL (ref 3.5–5.7)
Alkaline Phosphatase: 69 U/L (ref 36–125)
Bilirubin, Direct: 0.11 mg/dL (ref 0.00–0.40)
Bilirubin, Indirect: 0.39 mg/dL (ref 0.00–1.10)
Total Bilirubin: 0.5 mg/dL (ref 0.0–1.5)
Total Protein: 6.6 g/dL (ref 6.4–8.9)

## 2023-09-22 LAB — OVA AND PARASITE COMPREHENSIVE W/ GIARDIA/CRYPTO

## 2023-09-22 LAB — GIARDIA CRYPTOSPORIDIUM ANTIGENS
Cryptosporidium Ag: NEGATIVE
Giardia Ag: NEGATIVE

## 2023-09-22 NOTE — Patient Instructions (Addendum)
 Please call [phone] to make an appointment with surgery.   Follow up in 3 months.

## 2023-09-22 NOTE — Progress Notes (Signed)
 Chief Complaint   Patient presents with    Liver Mass/lesion    Follow-up        History of Present Illness:  Joanna Lopez is a 73 y.o. female with multilobulated or cystic lesions of the liver on imaging who returns for follow-up visit.  Last seen April 2025.    PMH lung cancer (s/p VATS, chemo) 2023, HTN, CKD stage III, pancreatic insufficiency    Patient was  admitted to [facility] mid April 2025 for complaints of new and worsening left upper quadrant pain over the last 6 weeks.  With leukocytosis, AKI.  At that time she had a CT abdomen that showed mild inflammatory changes adjacent to multilobulated septated left hepatic cystic lesion that were nonspecific and possibly representative of infection or inflammation versus recent rupture.  She was treated with antibiotics (Rocephin , Flagyl ) and discharged on Cipro /Flagyl  for 5 days.   Interventional radiology recommended outpatient follow-up and possible drainage if indicated.  Repeat CT abdomen showed  showed a possible evolving abscess.  She was seen by infectious disease (Dr. Tivis Forster) last week and antibiotics were restarted (cefdinir and Flagyl ), she had severe pain in her heels with Cipro , with plans to continue until radiographic and clinical evidence of resolution.  Her abdominal pain initially resolved and then started to worsen and she has been having constant nausea and extreme fatigue.  Liver tests have remained normal most recently are normal (ALP 62, AST 21, ALT 16, t bili 0.6), WBC down from 16.6 to 8.7 on discharge and has been trending up most recently 11,000.  There is some concern for parasitic infection with echinococcus in relation to the cystic lesions and a recommendation against rupturing the cyst in any way. She has been referred to surgery for evaluation.  She was also having new diarrhea and stool studies are negative for cryptosporidium, Giardia and she has another stool sample to turn in today. She continues to have the  nausea, eating doesn't help. Mostly in the mornings. Less in the evening. Not vomiting. Continues to have diarrhea. She has started coughing. She has an appointment coming up with pancreas clinic to evaluate the pancreatic insufficiency. Appetite remains poor. Having some mild LUQ abdominal pain. Rare alcohol use. Patient's daughter works as a IT sales professional in [place] and has consulted with the physicians at the hospital there who recommend checking for abdominal TB since she visited [place] in the past .  Denies fever, edema, hematochezia, melena, hematemesis.       Testing:   -CT abdomen and pelvis 08/25/2023: Multiple hepatic cystic lesions including multilobulated lesion in the left hepatic lobe measuring 5 x 7.4 cm.  This lesion is slightly increased from 3.7 x 6.7 cm on 12/29/2016.  There are suspected internal septations without discrete enhancing nodule identified.  There is adjacent fat stranding to this lesion, new from prior.  Additional scattered subcentimeter low-density lesions in the liver too small to characterize but are favored to represent small cysts or hemangiomas.  Nonspecific and may represent infection or inflammation of the cystic lesion versus recent rupture.  -CT abdomen and pelvis 09/13/2023: Smaller size of multiseptated cystic lesion in segment 3 of the liver with decrease of the adjacent inflammatory change.  There is interval development of a new thick-walled cystic lesion medially in segment 3 which may represent an abscess.     Review of Systems   The following portions of the patient history were reviewed and updated as appropriate: allergies, current medications, family, medical, surgical and  social history and problem list.    A comprehensive Review of Systems was completed and was negative other than what has been noted in the HPI.    Past Medical History:  She has a past medical history of Anxiety, Basal cell cancer, Cataract, Hyperlipidemia, Hypertension, Lung cancer (CMS-HCC), OSA  (obstructive sleep apnea), RLS (restless legs syndrome), and Stroke (CMS-HCC).    Medications:  [Current Medications]    [Current Medications]    Current Outpatient Medications:     aspirin  325 MG tablet, Take 1 tablet (325 mg total) by mouth daily., Disp: 30 tablet, Rfl: 0    atorvastatin  (LIPITOR ) 80 MG tablet, Take 1 tablet (80 mg total) by mouth every morning., Disp: , Rfl:     BIOTIN ORAL, Take 1 tablet by mouth daily., Disp: , Rfl:     busPIRone  (BUSPAR ) 10 MG tablet, Take 1 tablet (10 mg total) by mouth in the morning and at bedtime., Disp: , Rfl:     calcium -vitamin D  (OSCAL-500 + D) 500 mg-5 mcg (200 unit) per tablet, Take 1 tablet by mouth daily., Disp: , Rfl:     gabapentin  (NEURONTIN ) 300 MG capsule, Take 1 capsule (300 mg total) by mouth 3 times a day., Disp: 90 capsule, Rfl: 1    leucovorin-pyridox-mecobalamin (FOLINIC-PLUS) 4-50-2 mg Tab, Take 1 tablet by mouth daily., Disp: , Rfl:     lipase /protease /amylase  (CREON  ORAL), Take 1 capsule by mouth 3 times a day as needed (for abdominal cramping). Take prior to meals, Disp: , Rfl:     lisinopriL  (PRINIVIL ) 10 MG tablet, Take 1 tablet (10 mg total) by mouth daily., Disp: , Rfl:     loperamide  (IMODIUM ) 2 mg capsule, Take 1-2 capsules (2-4 mg total) by mouth 4 times a day as needed for Diarrhea. Take 4 mg (2 capsules) after the first loose stool and then 1 capsule for each loose stool thereafter, Disp: , Rfl:     MELATONIN ORAL, Take 10 mg by mouth at bedtime as needed (sleep)., Disp: , Rfl:     multivitamin (THERAGRAN) tablet, Take 1 tablet by mouth daily., Disp: , Rfl:     ondansetron  (ZOFRAN ) 8 MG tablet, Take 1 tablet (8 mg total) by mouth every 8 hours as needed for Nausea., Disp: 30 tablet, Rfl: 3    PARoxetine  (PAXIL ) 20 MG tablet, Take 1 tablet (20 mg total) by mouth at bedtime., Disp: , Rfl:     polyethylene glycol (MIRALAX ) 17 gram/dose powder, Take 17 g by mouth daily as needed (constipation)., Disp: , Rfl:     roPINIRole  (REQUIP ) 1 MG tablet,  Take 1 tablet (1 mg total) by mouth at bedtime. Indications: Restless Legs Syndrome, Disp: , Rfl:       Allergies:  Penicillins, Adhesive, Ciprofloxacin , Formaldehyde analogues, Nsaids (non-steroidal anti-inflammatory drug), Promethazine  hcl, Latex, and Sulfa (sulfonamide antibiotics)    Family History:  Her family history includes Diabetes in her son. She was adopted.    Past Surgical History:  She has a past surgical history that includes Appendectomy; submastoid gland removal; Uvulopalatopharyngoplasty; Tonsillectomy; Colonoscopy (N/A, 01/23/2013); Flexible bronchoscopy w/ upper endoscopy (N/A, 09/17/2016); Thoracoscopy (Right, 11/23/2016); Loop Implant  Implantation (N/A, 04/07/2017); Loop Removal (Left, 04/09/2021); Cataract extraction, bilateral (Bilateral, 07/2021); thoracoscopy lobectomy video assisted (Left, 11/18/2021); and insertion catheter vascular access (N/A, 12/26/2021).    Social History:  She reports that she has never smoked. She has never used smokeless tobacco. She reports that she does not currently use alcohol. She reports that she does not  use drugs.    The following portions of the patient's history were reviewed and updated as appropriate: allergies, current medications, past family history, past medical history, past social history, past surgical history, and problem list.    Review of Systems:  ROS    Vital Signs:  Blood pressure 118/82, pulse 107, resp. rate 16, height 5' 7 (1.702 m), weight 185 lb (83.9 kg), SpO2 98%.  Physical Exam  Vitals reviewed.   Constitutional:       General: She is not in acute distress.     Appearance: Normal appearance. She is well-developed. She is obese. She is not diaphoretic.   HENT:      Head: Normocephalic and atraumatic.   Eyes:      General: No scleral icterus.        Right eye: No discharge.         Left eye: No discharge.      Conjunctiva/sclera: Conjunctivae normal.   Neck:      Trachea: No tracheal deviation.   Cardiovascular:      Rate and Rhythm:  Normal rate and regular rhythm.      Heart sounds: Normal heart sounds. No murmur heard.     No gallop.   Pulmonary:      Effort: Pulmonary effort is normal. No respiratory distress.      Breath sounds: Normal breath sounds. No stridor. No wheezing, rhonchi or rales.   Abdominal:      General: Abdomen is flat. Bowel sounds are normal. There is no distension.      Palpations: Abdomen is soft. There is no mass.      Tenderness: There is no abdominal tenderness. There is no guarding or rebound.      Hernia: No hernia is present.      Comments: No dullness to flanks. Liver edge palpable on inspiration. Mild tenderness LUQ with deep palpation.   Musculoskeletal:      Right lower leg: No edema.      Left lower leg: No edema.   Lymphadenopathy:      Cervical: No cervical adenopathy.   Skin:     General: Skin is warm and dry.      Comments: No palmar erythema, jaundice or spider angiomata.    Neurological:      Mental Status: She is alert and oriented to person, place, and time.      Comments: No asterixis or tremors.    Psychiatric:         Behavior: Behavior normal.         Thought Content: Thought content normal.         Judgment: Judgment normal.       Labs and imaging reviewed    Review of Lab Results:  Lab Results   Component Value Date    WBC 11.0 (H) 09/17/2023    HGB 13.3 09/17/2023    HGB 12.1 11/18/2021    HCT 38.4 09/17/2023    HCT 40.5 11/23/2016    MCV 89.4 09/17/2023    PLT 164 09/17/2023    CREATININE 1.44 (H) 09/17/2023    BUN 32 (H) 09/17/2023    NA 140 09/17/2023    K 4.3 09/17/2023    CL 108 09/17/2023    CO2 22 09/17/2023    ALT 16 09/17/2023    ALT 16 09/17/2023    AST 22 09/17/2023    ALKPHOS 63 09/17/2023    ALKPHOS 62 09/17/2023    BILITOT 0.6 09/17/2023  BILITOT 0.6 09/17/2023       Prior Diagnostic Testing:   CT: Abdomen and pelvis 08/25/2023  ABDOMEN AND PELVIS   Mild inflammatory changes adjacent to a multilobulated septated left hepatic cystic lesion; nonspecific and may represent infection or  inflammation of the cystic lesion versus recent rupture.     CT abdomen and pelvis 09/13/2023  Impression   IMPRESSION:  Smaller size of the multiseptated cystic lesion in segment 3 of the liver with decrease of the adjacent inflammatory change. There is interval development of a new thick-walled cystic lesion medially in segment 3 which may represent abscess.          Assessment & Plan:  Joanna Lopez is a 73 y.o. female with multilobulated or cystic lesions of the liver on imaging who returns for follow-up visit.  Last seen April 2025.    Liver cysts: normal liver tests. Multiple cystic lesions in the liver. Continue antibiotics and follow up with infectious disease and surgery as scheduled.Will check liver panel with next labs.   Cystic lesions/abscess: Continue antibiotics and follow-up as per Dr. Tivis Forster   Diarrhea:Stool studies neg for cryptosporidium, Giardia. Turning in another stool sample today. .   Labs as ordered.  Follow up in three months.        Medical Decision Making:  The following items were considered in medical decision making:  Permanent chart problem/surgery list reviewed  Permanent chart chronic medication/allergy list reviewed  Permanent chart social/family history reviewed  Review/order clinical lab tests  Review/order radiology tests  Review/order other diagnostic or treatment interventions  Rev iew old records    This note was completely edited, written and reviewed by me and consists of information cut and pasted from the my most recent visit, my smart phrases and other Epic tools. I have personally reviewed all aspects of this note to at least include reviewing this patient's chart and problem list, updating the history, physical exam, lab and procedure results, and assessment and plan as detailed above and below.  As such this visit note reflects my current evaluation and management for this patient.      I spent 12 minutes face to face with this patient with greater than 50% of  this time spent in counseling and coordination of care discussing items in the Assessment and Plan above. I also spent 23 minutes on the same day as the encounter performing additional activities related to this service. This time excludes time spent on any separately billed services.

## 2023-09-29 NOTE — Telephone Encounter (Signed)
 Pt called requesting letter to be excused from [employer].    Juror/Badge Number: [id number]    Dates of Summons: 10/05/23-10/12/23    Requests letter be uploaded into Mychart.    May be reached if needed at [phone].

## 2023-10-08 NOTE — Telephone Encounter (Signed)
 Called with results definitively negative for echinococcus and amebiasis. I have no lingering concerns for tropical disease as an explanation for her liver lesions. Left a detailed message.     Arlander Bellman MD  Infectious Diseases

## 2023-10-11 NOTE — Telephone Encounter (Signed)
 Called pt at number listed in demographics. Spoke with pt and introduced self. Stated that I was calling to get a little bit more information about her upcoming visit with Dr. Hilario Lover on 10/26/23. 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 Reports that 3 times over the past year she has had episodes of feeling like she is disoriented and things are moving in slow motion that is sometimes accompanied by a metallic smell and taste in her mouth. She reports concerns of these being TIAs and requested a referral from her pulmonologist to discuss with a neurology provider. Last episode was in March of 2025.     Confirmed most recent head imaging was from December 2024 at [facility]. Explained I would request all outside imaging for review prior to appt with Dr. Hilario Lover. Verbalized understanding.

## 2023-10-11 NOTE — Telephone Encounter (Signed)
 Pt called regarding referral for General Surgery for Liver Cyst. States she was informed by General Surgery they do not do that kind of surgery. Dr. Tivis Forster will have to place a new referral to a specialized surgeon.    Please return call to [phone].

## 2023-10-13 NOTE — Telephone Encounter (Signed)
 Received call from pt regarding needing a surgeon for her liver- pt stated she had previous surgeries w/ Dr. Crist Dominion for her lungs- Triage RN does see referral was placed today for surgical oncology. Triage RN discussed with pt it will probably take at least a day for surgical oncology to call her to schedule from the referral, Triage RN unsure of all the steps necessary for insurance approval and such prior to getting a pt scheduled. Suggested pt call back tomorrow afternoon if she has not yet heard anything. PT VBU and agreement with plan.

## 2023-10-13 NOTE — Telephone Encounter (Signed)
 Patient notified referral sent.    Lucrezia Sachs RN,BSN  Oncology Nurse Triage

## 2023-10-13 NOTE — Telephone Encounter (Signed)
 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 She stated that she has cyst and lesions that need to be removed.She said she was referred to General surgery by us  but they wont see her. She said she needs a new referral to someone who can take her on as a patient.     Reviewed chart and general surgery stated that they will not see patient.   Called surgical oncology and spoke to RN who placed me on hold to speak to a scheduler. The scheduler reviewed the chart and stated this would be something the surgical oncology team can schedule the patient for we just need a new referral.     Please place new referral for surgical oncology and call patient once this order is placed.     Time spent    Lucrezia Sachs RN,BSN  Oncology Nurse Triage

## 2023-10-15 ENCOUNTER — Ambulatory Visit: Admit: 2023-10-15 | Discharge: 2023-10-15 | Payer: MEDICARE

## 2023-10-15 DIAGNOSIS — C3412 Malignant neoplasm of upper lobe, left bronchus or lung: Secondary | ICD-10-CM

## 2023-10-15 NOTE — Progress Notes (Signed)
 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)  Staging form: Lung, AJCC 8th Edition  - Clinical: No stage assigned - Unsigned  - Pathologic stage from 12/11/2016: Stage IA2 (pT1b, pN0, cM0) - Signed by Crist Dominion, MD on 12/11/2016    Malignant neoplasm of upper lobe of left lung (CMS-HCC)  Staging form: Lung, AJCC 8th Edition  - Pathologic stage from 12/04/2021: Stage IIB (pT1a, pN1, cM0) - Signed by Crist Dominion, MD on 12/04/2021      GENOMICS:  EGFR mt negative  PD-L1 95%  ALK-EML4 fusion positive per ALCHEMIST screening trial testing    SPECIAL MEDICAL ISSUES: 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 11/18/21: Left VATS lingulectomy  2. 01/02/22 - 03/09/22: Adjuvant cisplatin , pemetrexed  (C2+ dose reduced due to intolerance - n/v/fatigue)  3. 10/06/22 - 05/20/23: Adjuvant Alectinib PO (dose reduced multiple times due to nausea, constipation/diarrhea, fatigue, tendinopathy/myopathy, headaches)    ECOG PERFORMANCE STATUS: (1) Restricted in physically strenuous activity but ambulatory and able to carry out work of a light or sedentary nature, e.g., light house work, office work      INTERVAL HISTORY:  Joanna Lopez is here for follow up of her lung cancer. She has had a lot of issues with her liver and abdominal pain and is trying to get into a surgeon to discuss removal of liver cysts potentially and has primarily been working with GI and ID to ensure no infectious process. She stopped taking her alectinib after further dose reduction and continued significant side effects and has felt much better since.  She denies other new issues or breathing changes. Energy is improved.     MEDICATIONS:  Current Outpatient Medications   Medication Sig    aspirin  Take 1 tablet (325 mg total) by mouth daily.    atorvastatin  Take 1 tablet (80 mg total) by mouth every morning.    BIOTIN ORAL Take 1 tablet by mouth daily.    busPIRone  Take 1 tablet (10 mg total) by mouth in the morning and at bedtime.    calcium -vitamin D  Take 1 tablet by mouth daily.    gabapentin  Take 1 capsule (300 mg total) by mouth 3 times a day.    Folinic-Plus Take 1 tablet by mouth daily.    lipase /protease /amylase  (CREON  ORAL) Take 1 capsule by mouth 3 times a day as needed (for abdominal cramping). Take prior to meals    lisinopriL  Take 1 tablet (10 mg total) by mouth daily.    loperamide  Take 1-2 capsules (2-4 mg total) by mouth 4 times a day as needed for Diarrhea. Take 4 mg (2 capsules) after the first loose stool and then 1 capsule for each loose stool thereafter    MELATONIN ORAL Take 10 mg by mouth at bedtime as needed (sleep).    multivitamin Take 1 tablet by mouth daily.    ondansetron  Take 1 tablet (8 mg total) by mouth every 8 hours as needed for Nausea.    PARoxetine  Take 1 tablet (20 mg  total) by mouth at bedtime.    polyethylene glycol Take 17 g by mouth daily as needed (constipation).    roPINIRole  Take 1 tablet (1 mg total) by mouth at bedtime. Indications: Restless Legs Syndrome     No current facility-administered medications for this visit.       ALLERGIES:    Penicillins, Adhesive, Ciprofloxacin , Formaldehyde analogues, Nsaids (non-steroidal anti-inflammatory drug), Promethazine  hcl, Latex, and Sulfa (sulfonamide antibiotics)    REVIEW OF SYSTEMS:  Review of Systems   Constitutional:  Negative for chills, fever, malaise/fatigue and weight loss.   HENT:  Negative for congestion, hearing loss, sore throat and tinnitus.    Eyes:  Negative for blurred vision and double vision.   Respiratory:  Negative for cough, hemoptysis and shortness of  breath.    Cardiovascular:  Negative for chest pain, palpitations and leg swelling.   Gastrointestinal:  Positive for abdominal pain. Negative for constipation, diarrhea, nausea and vomiting.   Genitourinary:  Negative for dysuria, flank pain and urgency.   Musculoskeletal:  Negative for joint pain, myalgias and neck pain.   Skin:  Negative for itching and rash.   Neurological:  Positive for sensory change. Negative for dizziness, weakness and headaches.   Psychiatric/Behavioral:  Negative for depression, substance abuse and suicidal ideas. The patient is not nervous/anxious.    All other systems reviewed and are negative.      PHYSICAL EXAMINATION:  Vitals:    10/15/23 1149   BP: 109/79   BP Location: Right upper arm   Pulse: 90   Resp: 16   Temp: 97.5 F (36.4 C)   TempSrc: Temporal   SpO2: 97%   Weight: 183 lb (83 kg)   Height: 5' 7 (1.702 m)        Body surface area is 1.98 meters squared.    Physical Exam  Vitals reviewed.   Constitutional:       General: She is not in acute distress.     Appearance: Normal appearance. She is normal weight. She is not ill-appearing.   HENT:      Head: Normocephalic and atraumatic.      Nose: Nose normal.      Mouth/Throat:      Mouth: Mucous membranes are moist.      Pharynx: Oropharynx is clear.   Eyes:      General: No scleral icterus.     Extraocular Movements: Extraocular movements intact.      Conjunctiva/sclera: Conjunctivae normal.   Cardiovascular:      Rate and Rhythm: Normal rate.      Pulses: Normal pulses.   Pulmonary:      Effort: Pulmonary effort is normal. No respiratory distress.   Abdominal:      General: Abdomen is flat. There is no distension.   Musculoskeletal:         General: No swelling or tenderness. Normal range of motion.      Cervical back: Normal range of motion.   Skin:     General: Skin is warm and dry.      Coloration: Skin is not jaundiced.      Findings: No rash.   Neurological:      General: No focal deficit present.      Mental Status: She  is alert and oriented to person, place, and time. Mental status is at baseline.      Cranial Nerves: No cranial nerve deficit.      Gait: Gait normal.   Psychiatric:  Mood and Affect: Mood normal.         Behavior: Behavior normal.         Thought Content: Thought content normal.       LABORATORY DATA:  Lab Results   Component Value Date    WBC 11.0 (H) 09/17/2023    RBC 4.29 09/17/2023    HGB 13.3 09/17/2023    HCT 38.4 09/17/2023    MCV 89.4 09/17/2023    MCH 31.0 09/17/2023    MCHC 34.7 09/17/2023    RDW 13.9 09/17/2023    PLT 164 09/17/2023        Lab Results   Component Value Date    GLUCOSE 153 (H) 09/17/2023    BUN 32 (H) 09/17/2023    CO2 22 09/17/2023    CREATININE 1.44 (H) 09/17/2023    K 4.3 09/17/2023    NA 140 09/17/2023    CL 108 09/17/2023    CALCIUM  9.0 09/17/2023    ALBUMIN 3.9 09/22/2023    PROT 6.6 09/22/2023    ALKPHOS 69 09/22/2023    ALT 18 09/22/2023    AST 22 09/17/2023    BILITOT 0.5 09/22/2023     No results found for: TSH, T3TOTAL, T4TOTAL, T3FREE, FREET4, THYROIDAB     RADIOLOGY REVIEW:  Images personally reviewed and compared to priors  CT Chest WO contrast  Result Date: 09/13/2023  IMPRESSION: Stable examination with no evidence of recurrent or metastatic disease in the chest. Report Verified by: Pandora Bogaert, MD at 09/13/2023 1:20 PM EDT    CT Abdomen and Pelvis With IV contrast  Result Date: 09/13/2023  IMPRESSION: Smaller size of the multiseptated cystic lesion in segment 3 of the liver with decrease of the adjacent inflammatory change. There is interval development of a new thick-walled cystic lesion medially in segment 3 which may represent abscess. Report Verified by: Jayakrishna Gollamudi, MD at 09/13/2023 10:09 AM EDT    CT Pulmonary Angiography  Result Date: 08/25/2023  IMPRESSION: CHEST CTA No acute pulmonary embolism. ABDOMEN AND PELVIS Mild inflammatory changes adjacent to a multilobulated septated left hepatic cystic lesion; nonspecific and may represent  infection or inflammation of the cystic lesion versus recent rupture. Approved by Lupie Salle, MD on 08/25/2023 6:03 AM EDT I have personally reviewed the images and I agree with this report. Report Verified by: Ellis Guys, DO at 08/25/2023 6:29 AM EDT    CT Abdomen and Pelvis With IV contrast  Result Date: 08/25/2023  IMPRESSION: CHEST CTA No acute pulmonary embolism. ABDOMEN AND PELVIS Mild inflammatory changes adjacent to a multilobulated septated left hepatic cystic lesion; nonspecific and may represent infection or inflammation of the cystic lesion versus recent rupture. Approved by Lupie Salle, MD on 08/25/2023 6:03 AM EDT I have personally reviewed the images and I agree with this report. Report Verified by: Ellis Guys, DO at 08/25/2023 6:29 AM EDT    X-ray Chest PA and Lateral  Result Date: 08/25/2023  IMPRESSION: No acute cardiopulmonary abnormality. Approved by Lupie Salle, MD on 08/25/2023 4:46 AM EDT I have personally reviewed the images and I agree with this report. 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  - She had significant toxicity despite multiple dose reductions to alectinib and stopped this a few months  ago now and is doing much better.   - Will monitor off treatment in surveillance ongoing. Will plan for new imaging in 3-6 months with new CT scan. At next visit will order MRI brain for ongoing surveillance until 2 years given high risk ALK mutation.  - RTC in 3-6 months or sooner if needed with scan.     2) Neuropathy  - Taking Gabapentin  300 mg po daily - very helpful for neurological pain. Denies neurological pain at this time.     Any elements of the HPI and Assessment/Plan copied from prior notes have been updated where appropriate and all reflect current medical decision making from  today, 10/15/2023. Physical examination listed above was completed in entirety today, 10/15/2023.    Terral Ferrari, MD  Thoracic and Head & Neck Oncology  [facility]

## 2023-10-25 ENCOUNTER — Ambulatory Visit: Admit: 2023-10-25 | Discharge: 2023-10-29 | Payer: MEDICARE

## 2023-10-25 ENCOUNTER — Ambulatory Visit: Admit: 2023-10-25 | Discharge: 2023-10-31 | Payer: MEDICARE | Attending: Surgical Oncology

## 2023-10-25 DIAGNOSIS — K7689 Other specified diseases of liver: Secondary | ICD-10-CM

## 2023-10-25 NOTE — Progress Notes (Signed)
 Subjective   History of Present Illness:   Joanna Lopez is a 73 y.o. female    HPI   with liver cyst. Patient with PMH of lung cancer x 2, she has a history of stroke and TIA.  She underwent surgery in 2017 and 2022 for lung cancer.  She presented to ED on 08/25/23 with complaints of new and worsening left upper quadrant pain. CT A/P at that time noted multiple hepatic cystic lesions, including a multilobulated lesion in the left hepatic lobe and another cyst in the right lobe.  There was concern for cyst perforation and abscess formation.  She was treated with antbx as definitive treatment.  Repeat imaging on 09/13/23 CT A/P noted smaller size of the multiseptated cystic lesion in segment 3 of the liver with decrease of the adjacent inflammatory change. She has been referred for management of the liver cyst.  She is now asymptomatic, she denies right sided abdominal pain, denies early satiety or nausea/ vomiting.  She is on daily aspirin .  Today we discussed the natural history of benign liver cyst.  I have recommended observation for now.  Histories:     She has a past medical history of Anxiety, Basal cell cancer, Cataract, Hyperlipidemia, Hypertension, Lung cancer (CMS-HCC), OSA (obstructive sleep apnea), RLS (restless legs syndrome), and Stroke (CMS-HCC).    She has a past surgical history that includes Appendectomy; submastoid gland removal; Uvulopalatopharyngoplasty; Tonsillectomy; Colonoscopy (N/A, 01/23/2013); Flexible bronchoscopy w/ upper endoscopy (N/A, 09/17/2016); Thoracoscopy (Right, 11/23/2016); Loop Implant  Implantation (N/A, 04/07/2017); Loop Removal (Left, 04/09/2021); Cataract extraction, bilateral (Bilateral, 07/2021); thoracoscopy lobectomy video assisted (Left, 11/18/2021); and insertion catheter vascular access (N/A, 12/26/2021).    Her family history includes Diabetes in her son. She was adopted.    She reports that she has never smoked. She has never used smokeless tobacco. She  reports that she does not currently use alcohol. She reports that she does not use drugs.      Review of Systems   Constitutional:  Negative for activity change, appetite change, chills, diaphoresis, fatigue and fever.   HENT:  Negative for facial swelling.    Eyes:  Negative for pain, discharge and itching.   Respiratory:  Negative for cough, choking, chest tightness, shortness of breath, wheezing and stridor.    Cardiovascular:  Negative for chest pain, palpitations and leg swelling.   Gastrointestinal:  Positive for diarrhea. Negative for abdominal distention, abdominal pain, anal bleeding, bloating, blood in stool, constipation, heartburn, nausea, rectal pain and vomiting.   Musculoskeletal:  Negative for arthralgias, back pain and neck pain.   Neurological:  Negative for dizziness.   All other systems reviewed and are negative.      Allergies:   Penicillins, Adhesive, Ciprofloxacin , Formaldehyde analogues, Nsaids (non-steroidal anti-inflammatory drug), Promethazine  hcl, Latex, and Sulfa (sulfonamide antibiotics)    Medications:   Encounter Medications[1]    Objective   There were no vitals taken for this visit.    Physical Exam  Vitals and nursing note reviewed.   Constitutional:       Appearance: Normal appearance. She is normal weight.   HENT:      Head: Normocephalic and atraumatic.      Right Ear: External ear normal.      Left Ear: External ear normal.      Nose: Nose normal.   Eyes:      Extraocular Movements: Extraocular movements intact.      Conjunctiva/sclera: Conjunctivae normal.   Cardiovascular:  Rate and Rhythm: Normal rate.   Pulmonary:      Effort: Pulmonary effort is normal.   Abdominal:      General: Abdomen is flat. There is no distension.      Palpations: Abdomen is soft.      Tenderness: There is no abdominal tenderness. There is no guarding.   Musculoskeletal:         General: Normal range of motion.      Cervical back: Normal range of motion.   Skin:     General: Skin is warm and dry.       Coloration: Skin is not jaundiced.      Findings: No bruising or erythema.   Neurological:      General: No focal deficit present.      Mental Status: She is alert and oriented to person, place, and time. Mental status is at baseline.   Psychiatric:         Mood and Affect: Mood normal.         Behavior: Behavior normal.         Thought Content: Thought content normal.         Judgment: Judgment normal.     Review of Lab Results:      Lab Results   Component Value Date    WBC 11.0 (H) 09/17/2023    HGB 13.3 09/17/2023    HGB 12.1 11/18/2021    HCT 38.4 09/17/2023    HCT 40.5 11/23/2016    PLT 164 09/17/2023    GLUCOSE 153 (H) 09/17/2023    BUN 32 (H) 09/17/2023    CREATININE 1.44 (H) 09/17/2023    NA 140 09/17/2023    K 4.3 09/17/2023    CL 108 09/17/2023    CO2 22 09/17/2023    EGFR 38 09/17/2023    BILITOT 0.5 09/22/2023    PROT 6.6 09/22/2023    AST 22 09/17/2023    ALT 18 09/22/2023    ALKPHOS 69 09/22/2023       Imaging:       Pathology:       Cancer Staging:    Cancer Staging   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)  Staging form: Lung, AJCC 8th Edition  - Clinical: No stage assigned - Unsigned  - Pathologic stage from 12/11/2016: Stage IA2 (pT1b, pN0, cM0) - Signed by Crist Dominion, MD on 12/11/2016    Malignant neoplasm of upper lobe of left lung (CMS-HCC)  Staging form: Lung, AJCC 8th Edition  - Pathologic stage from 12/04/2021: Stage IIB (pT1a, pN1, cM0) - Signed by Crist Dominion, MD on 12/04/2021       Assessment   73 y.o. female with multifocal liver cyst.   Plan   We will repeat imaging in 6 months  RTC in six months.    I have reviewed the ROS.                [1]  Outpatient Encounter Medications as of 10/25/2023   Medication Sig Dispense Refill    aspirin  325 MG tablet Take 1 tablet (325 mg total) by mouth daily. 30 tablet 0    atorvastatin  (LIPITOR ) 80 MG tablet Take 1 tablet (80 mg total) by mouth every morning.      BIOTIN ORAL Take 1 tablet by mouth daily.      busPIRone   (BUSPAR ) 10 MG tablet Take 1 tablet (10 mg total) by mouth in the morning and at bedtime.  calcium -vitamin D  (OSCAL-500 + D) 500 mg-5 mcg (200 unit) per tablet Take 1 tablet by mouth daily. (Patient taking differently: Take 1 tablet by mouth if needed.)      gabapentin  (NEURONTIN ) 300 MG capsule Take 1 capsule (300 mg total) by mouth 3 times a day. 90 capsule 1    leucovorin-pyridox-mecobalamin (FOLINIC-PLUS) 4-50-2 mg Tab Take 1 tablet by mouth daily.      lipase /protease /amylase  (CREON  ORAL) Take 1 capsule by mouth 3 times a day as needed (for abdominal cramping). Take prior to meals      lisinopriL  (PRINIVIL ) 10 MG tablet Take 1 tablet (10 mg total) by mouth daily.      loperamide  (IMODIUM ) 2 mg capsule Take 1-2 capsules (2-4 mg total) by mouth 4 times a day as needed for Diarrhea. Take 4 mg (2 capsules) after the first loose stool and then 1 capsule for each loose stool thereafter      MELATONIN ORAL Take 10 mg by mouth at bedtime as needed (sleep).      multivitamin (THERAGRAN) tablet Take 1 tablet by mouth daily.      ondansetron  (ZOFRAN ) 8 MG tablet Take 1 tablet (8 mg total) by mouth every 8 hours as needed for Nausea. 30 tablet 3    PARoxetine  (PAXIL ) 20 MG tablet Take 1 tablet (20 mg total) by mouth at bedtime.      polyethylene glycol (MIRALAX ) 17 gram/dose powder Take 17 g by mouth daily as needed (constipation).      roPINIRole  (REQUIP ) 1 MG tablet Take 1 tablet (1 mg total) by mouth at bedtime. Indications: Restless Legs Syndrome       No facility-administered encounter medications on file as of 10/25/2023.

## 2023-10-25 NOTE — Progress Notes (Addendum)
 ID Clinic Note    No chief complaint on file.      HPI:  Joanna Lopez is a 73 y.o. lady with a history of metastatic lung cancer treated in 2023 with chemotherapy, presenting with LUQ pain. Starting 08/2023, she developed LUQ pain slowly, it came and went for a few weeks before eventually becoming constant and getting worse, 08/25/2023 came to the hospital. She was told she had an infected liver cyst, IV antibiotics helped her feel better after a few days. She was told they needed to remove the infected cyst, but IR declined the procedure, so she went home with oral antibiotics. She took her cipro  and flagyl  for 5d, and developed severe pain in her heels where she felt like they might snap. She finished the 5d course of antibiotics though and the abd pain was gone, but a few days after the antibiotic course finished the LUQ pain returned with nausea. 09/17/2023 she restarted cefdinir +flagyl  which she finished 21d course. She feels all the way better now, no abd pain at all, no nausea at all after she finished the antibiotics. Loose diarrhea 4x daily.    ROS:  Review of Systems   Constitutional:  Negative for chills and fever.   HENT:  Negative for congestion and sore throat.    Respiratory:  Negative for cough and shortness of breath.    Cardiovascular:  Positive for palpitations (sometimes a fluttering feeling, only rarely). Negative for chest pain and leg swelling.   Gastrointestinal:  Positive for diarrhea (4x daily). Negative for abdominal pain (resolved), constipation, nausea and vomiting.        Loss of appetite   Genitourinary:  Negative for dysuria and hematuria.   Musculoskeletal:  Positive for joint pain (chronic, stable). Negative for myalgias.   Skin:  Negative for itching (allergy to pro-biotic, resolved with cessation) and rash.   Neurological:  Negative for dizziness (if she doesn't eat, resolved) and headaches.   Psychiatric/Behavioral:  Negative for depression. The patient is not  nervous/anxious.      Past Medical History:   Diagnosis Date    Anxiety     Basal cell cancer     Cataract     Hyperlipidemia     Hypertension     Lung cancer (CMS-HCC)     OSA (obstructive sleep apnea)     RLS (restless legs syndrome)     Stroke (CMS-HCC)     5 yrs ago, TIA X 2     Past Surgical History:   Procedure Laterality Date    APPENDECTOMY      CATARACT EXTRACTION, BILATERAL Bilateral 07/2021    COLONOSCOPY N/A 01/23/2013    Procedure: COLONOSCOPY WITH MAC;  Surgeon: Rankin Dellen, MD;  Location: [facility] ENDOSCOPY;  Service: Gastroenterology;  Laterality: N/A;    FLEXIBLE BRONCHOSCOPY W/ UPPER ENDOSCOPY N/A 09/17/2016    Procedure: BRONCHOSCOPY SUPER D WITH FLUORO AND BIOPSY-EBUS/NAVI;  Surgeon: Sadia Benzaquen, MD;  Location: [facility] ENDOSCOPY;  Service: Pulmonary;  Laterality: N/A;    INSERTION CATHETER VASCULAR ACCESS N/A 12/26/2021    Procedure: INSERTION POWER PORT A CATH;  Surgeon: Lucendia Blanch, MD;  Location: [facility] OR;  Service: General;  Laterality: N/A;    LOOP IMPLANT IMPLANTATION N/A 04/07/2017    Procedure: Loop  Implantation;  Surgeon: Lue Legions, MD;  Location: [facility] ELECTROPHYSIOLOGY LAB;  Service: Electrophysiology;  Laterality: N/A;    LOOP REMOVAL Left 04/09/2021    Procedure: Loop Removal;  Surgeon: Rory Osier, MD;  Location: [facility] MIS  SUITE;  Service: Electrophysiology;  Laterality: Left;    submastoid gland removal      THORACOSCOPY Right 11/23/2016    Procedure: RIGHT SIDED VATS MIDDLE LOBE LOBECTOMY,HILAR MEDIASTINAL LYMPH NODE DISSECTION;  Surgeon: Nena Humbles, MD;  Location: [facility] OR;  Service: Thoracic;  Laterality: Right;    THORACOSCOPY LOBECTOMY VIDEO ASSISTED Left 11/18/2021    Procedure: Left Thoracoscopic Lingulectomy;  Surgeon: Nena Humbles, MD;  Location: [facility] OR;  Service: General;  Laterality: Left;    TONSILLECTOMY      UVULOPALATOPHARYNGOPLASTY         Family History   Adopted: Yes   Problem Relation Age of Onset    Diabetes Son        Social History     Socioeconomic  History    Marital status: Widowed     Spouse name: Not on file    Number of children: Not on file    Years of education: Not on file    Highest education level: Not on file   Occupational History    Not on file   Tobacco Use    Smoking status: Never    Smokeless tobacco: Never   Vaping Use    Vaping status: Never Used   Substance and Sexual Activity    Alcohol use: Not Currently     Comment: occasionally    Drug use: No    Sexual activity: Not Currently     Partners: Male   Other Topics Concern    Caffeine Use No     Comment: 1 can coke/day    Occupational Exposure Yes     Comment: Geographical information systems officer    Exercise No    Seat Belt Yes   Social History Narrative    [place] raised [place] , also had lived in [place]; [place] in the 1970[place]; [place], [place]; [employer]; and here in [place]    She's travelled to [place] 11[place], and then again 2022, [place], swimming in fresh water  rivers and ocean, eating local foods, street foods.    [place], [place] and also around the [place] 2017 or so.     Cruises to [place], [place]  last year.                   Social Drivers of Psychologist, prison and probation services Strain: Not on file   Food Insecurity: No Food Insecurity (08/25/2023)    Hunger Vital Sign     Worried About Running Out of Food in the Last Year: Never true     Ran Out of Food in the Last Year: Never true   Transportation Needs: No Transportation Needs (08/25/2023)    PRAPARE - Therapist, art (Medical): No     Lack of Transportation (Non-Medical): No   Physical Activity: Not on file   Stress: Not on file   Social Connections: Not on file   Intimate Partner Violence: Not At Risk (08/25/2023)    Humiliation, Afraid, Rape, and Kick questionnaire     Fear of Current or Ex-Partner: No     Emotionally Abused: No     Physically Abused: No     Sexually Abused: No   Housing Stability: Low Risk  (08/25/2023)    Housing Stability Vital Sign      Unable to Pay for Housing in the Last Year: No     Number of Times  Moved in the Last Year: 0     Homeless in the Last Year: No       Current Outpatient Medications   Medication Instructions    aspirin  325 mg, Oral, Daily    atorvastatin  (LIPITOR ) 80 mg, Every morning    BIOTIN ORAL 1 tablet, Daily    busPIRone  (BUSPAR ) 10 mg, 2 times daily    calcium -vitamin D  (OSCAL-500 + D) 500 mg-5 mcg (200 unit) per tablet 1 tablet, Daily    gabapentin  (NEURONTIN ) 300 mg, Oral, 3 times daily    leucovorin-pyridox-mecobalamin (FOLINIC-PLUS) 4-50-2 mg Tab 1 tablet, Daily    lipase /protease /amylase  (CREON  ORAL) 1 capsule, 3 times daily PRN    lisinopriL  (PRINIVIL ) 10 mg, Daily    loperamide  (IMODIUM ) 2-4 mg, 4 times daily PRN    MELATONIN ORAL 10 mg, Nightly PRN    multivitamin (THERAGRAN) tablet 1 tablet, Daily    ondansetron  (ZOFRAN ) 8 mg, Oral, Every 8 hours PRN    PARoxetine  (PAXIL ) 20 MG tablet 1 tablet, At Bedtime (2100)    polyethylene glycol (MIRALAX ) 17 g, Daily as needed    roPINIRole  (REQUIP ) 1 mg, At Bedtime (2100)        Vitals:  Vitals:    10/25/23 1331   BP: 118/54   Pulse: 88   SpO2: 98%       Physical exam:  Gen: A&Ox4, well appearing, comfortable, overweight  HEENT: MMM, EOMI,   Cardio: RRR, no m/r/g  Resp: CTAB  Abd: soft NT, ND, no HSM, no rebound or guarding  Ext: no pretibial edema, no cyanosis  Skin: no lesions, no rashes  Neuro: walks unassisted, no focal deficits  Psych: pleasant and cooperative  Lymph: no cervical LAD    Antibiotics:  Cefdinir +flagyl  21d  S/p cipro +flagyl  5d     Immunosupppression:  alectinib (held)    Labs: (Lab results reviewed, relevant results listed below)   09/22/23 13:21   Alkaline Phosphatase 69   AST (SGOT) 25   ALT (SGPT) 18   Albumin 3.9   Protein, Total 6.6   Bilirubin, Indirect 0.39   Bili, Total 0.5   Bilirubin, Direct 0.11       Imaging: (imaging results reviewed, relevant results listed below)  05/2010 CT  1. Multiloculated cystic focus within the lateral segment of the  left hepatic lobe left of midline showing complexity and infiltration of the adjacent mesenteric fat. Likely considerations include infected hepatic cysts or an infected collection inoculated by previous abdominal infection. Trauma now with if hemorrhage into the pre-existing cyst is also possible. Neoplasm could be considered but is considered less likely.   2014 PET In the liver there is an there are several small scattered lobe attenuation lesions, as well as a larger lobular fluid attenuation lesion measuring 5.1 x 3.3 cm within the left-most aspect of the left liver lobe. The larger lesion does not show FDG activity. Indeterminate lobular 5 x 3 cm cystic lesion in the left liver lobe.   2014 US :  Clustered cysts are present in the lateral segment of the left lobe of the liver measuring 3.7 x 2.6 x 4.8 cm in size with single cyst age measuring 2.2 x 1.6 cm. Small simple parotid cyst in the right lobe. Not all cysts on CAT scan are identified with the current ultrasound No ductal dilatation. No suspicious masses.   2015 CT Multiple hepatic cysts are noted as on prior PET imaging.   2018 PET HEPATOBILIARY:  No abnormal FDG uptake.  Liver  background SUV mean, as a reference for FDG studies, is SUV  1.7. Multiple non-FDG avid hypoattenuating lesions in the liver, probably benign cysts.   2019 PET HEPATOBILIARY:  No abnormal FDG uptake.  Liver background SUV mean, as a reference for FDG studies, is SUV  2.0   08/2023 CT   LIVER: Multiple hepatic cystic lesions, including a multilobulated lesion in the left hepatic lobe measuring 5 x 7.4 cm (series 305 image 32). This lesion is slightly increased from 3.7 x 6.7 cm on 12/29/2016. There are suspected internal septations without discrete enhancing nodule identified. There is adjacent fat stranding to this lesion (series 305 image 33), new from prior. Additional scattered subcentimeter low-density lesions liver too small to characterize but also favored to represent small  cysts or hemangiomas.     09/2023 CT  Liver: Normal morphology and attenuation.   *  There are multiple bilateral simple hepatic cysts. The largest is in segment 8 and measures 7.2 cm.   *  Arising from segment 3, there is an exophytic septated cystic lesion measuring 7.1 x 5 cm. (Series 306 image 126). This is not significantly changed in size since the recent comparison exam. The previously seen fat stranding adjacent to this cyst is mildly decreased. There are a few thin septations. No enhancing nodule.   *  There is a new cystic focus abutting the septated cystic lesion medially in segment 3 which measures 4.6 x 3.2 cm. There is mild thickening of the cyst wall. There is mild adjacent hyperemia best seen on the coronal images.  Impression  Smaller size of the multiseptated cystic lesion in segment 3 of the liver with decrease of the adjacent inflammatory change. There is interval development of a new thick-walled cystic lesion medially in segment 3 which may represent abscess.      Microbiology: (micro results reviewed, relevant results listed below)   Latest Reference Range & Units 09/17/23 10:45 09/20/23 17:03 09/22/23 13:30   Method  Concentration and Trichrome Stain Concentration and Trichrome Stain Concentration and Trichrome Stain   Cryptosporidium Ag Negative  Negative Negative Negative   Giardia Ag Negative  Negative Negative Negative   Results  No Amoeba, Ova, Or Parasites Seen.   --   O and P examination of additional specimens is recommended only for symptomatic patients, immunosuppressed patients or those with an appropriate travel history. No Amoeba, Ova, Or Parasites Seen.   --   O and P examination of additional specimens is recommended only for symptomatic patients, immunosuppressed patients or those with an appropriate travel history. No Amoeba, Ova, Or Parasites Seen.   --   O and P examination of additional specimens is recommended only for symptomatic patients, immunosuppressed patients or  those with an appropriate travel history.   Amoebiasis and echinococcus ab: both negative    Assessment and Plan:  SHYLO ZAMOR is a 73 y.o. lady here with LUQ abd pain, chronic liver cysts, and acute liver abscess.    She travelled to [place] 11/2009, then 05/2010 she presented to the ER for acute LUQ abd pain and diarrhea with findings of liver cysts on CT, largest in the left lobe, which have been followed for the last 53yrs over which time she was largely asymptomatic, before she again became symptomatic 08/2023 with LUQ pain due to mass effect and infection of a cyst which responded to short course of antibiotics, but she's still having the LUQ pain, worsening again off of antibiotics with new development of segment  3 liver abscess. Echinococcus and entamoeba was ruled out with stool O&Px3 and negative antibody testing. 09/17/2023 she was restarted on cefdinir  and flagyl  21d with clinical resolution of the abscess.    - monitor off of antibiotics   - warning signs for recurrent infection  - evaluation for cyst removal by surgery  - c diff testing for profuse diarrhea after antibiotic course  - quant gold    RTC PRN  Time spent on consult: 29m    Randine Crandall MD  Infectious Diseases

## 2023-10-25 NOTE — Patient Instructions (Addendum)
 C diff testing of your stool to see if the antibiotics caused a diarrheal infection.   You don't have any tropical cause of your liver cysts.  Follow with a surgeon to see if removing the cysts would be helpful.  A liver abscess is the kind of infection that you have once and you DON'T become immune to, always possible to get again.   Come in again early for any signs of recurrent infection, fevers, chills, abdominal pain, jaundice.

## 2023-10-25 NOTE — Addendum Note (Signed)
 Addended by: WENDEE SORA on: 10/29/2023 02:28 PM     Modules accepted: Orders

## 2023-10-26 ENCOUNTER — Ambulatory Visit: Admit: 2023-10-26 | Discharge: 2023-11-08 | Payer: MEDICARE | Attending: Neurology

## 2023-10-26 DIAGNOSIS — G459 Transient cerebral ischemic attack, unspecified: Secondary | ICD-10-CM

## 2023-10-26 DIAGNOSIS — R4189 Other symptoms and signs involving cognitive functions and awareness: Secondary | ICD-10-CM

## 2023-10-26 NOTE — Progress Notes (Unsigned)
 Chart Prep    Am I scheduled with the correct MD/APP  y  Am I scheduled with the correct visit type y  All Imaging in pacs  y  All testing and recommendations completed  y  Do I have a shunt y  Do I have an appointment scheduled 24 hours after MRI n  Do I have images placed prior to my appointment n  All imaging pulled from [facility] into pacs  y  All imaging requested from outside facilities sent to imagingrequests to merge  y  If pt is scheduled in [facility] and needs xrays prior did I call pt to have them arrive at least 30 minutes early and notify where xray is located?  y  Checked decision tree for images  y  Checked and updated care everywhere for images, notes, etc  y  Additional comments  n

## 2023-10-26 NOTE — Progress Notes (Signed)
 Neurovascular / Neurointerventional Surgery Clinic visit    CC:   Chief Complaint   Patient presents with    New Patient Visit/ Consultation     Pt reports several years ago pt had a couple TIAs. In the last year, pt has had spells of feeling like she is not present.  Pt states that spells get progressively worse and last longer. Has concern of these spells being TIAs or related to stroke     Reports that 3 times over the past year she has had episodes of feeling like she is disoriented and things are moving in slow motion that is sometimes accompanied by a metallic smell and taste in her mouth. She reports concerns of these being TIAs and requested a referral from her pulmonologist to discuss with a neurology provider. Last episode was in March of 2025        Today's history (10/26/2023):    2017 - had an episode - she was in car, developed visual changes - 4-5 images instead of one.  Lost hearing on left ear, impaired hearing on right ear.  Metallic taste and smell, cognitively slow, slow talking.  Metalic taste /smell resolved quickly, cognitive slowing / slowed speech was back to normal within a few weeks.  Vision / hearing returned over 4-83months.        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     More recently, her metallic taste has returned, slow motion, not responding to people well - spaced out.  No weakness, no numbness, no dysarthria.  People witnessed and said something's wrong with you.  spells lasted 30-83m of cog impairment.  She was placed back in her car and drove home (less than a block away).  Does not recall headache with these epidoses.  Went to bed and awoke fine the next day.      Has history of headaches.  Had cognitive impairment during these headaches as well.      3-4x over past 6-78mo.       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    Cerebrovascular accident (CVA) determined by clinical assessment (CMS-HCC) 02/19/2017    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) 12/11/2016    Acute respiratory insufficiency 11/24/2016    Essential hypertension 11/24/2016    Pain management 11/24/2016    Lung nodule 09/10/2016    Diarrhea 06/06/2013    GERD (gastroesophageal reflux disease) 07/21/2012    Lung nodule, solitary 07/21/2012       PSHx:  Past Surgical History:   Procedure Laterality Date    APPENDECTOMY      CATARACT EXTRACTION, BILATERAL Bilateral 07/2021    COLONOSCOPY N/A 01/23/2013    Procedure: COLONOSCOPY WITH MAC;  Surgeon: Rankin Dellen, MD;  Location: [facility];  Service: Gastroenterology;  Laterality: N/A;    FLEXIBLE BRONCHOSCOPY W/ UPPER ENDOSCOPY N/A 09/17/2016    Procedure: BRONCHOSCOPY SUPER D WITH FLUORO AND BIOPSY-EBUS/NAVI;  Surgeon: Sadia Benzaquen, MD;  Location: [facility] ENDOSCOPY;  Service: Pulmonary;  Laterality: N/A;    INSERTION CATHETER VASCULAR ACCESS N/A 12/26/2021    Procedure: INSERTION POWER PORT A CATH;  Surgeon: Lucendia Blanch, MD;  Location: [facility] OR;  Service: General;  Laterality: N/A;    LOOP IMPLANT IMPLANTATION N/A 04/07/2017    Procedure: Loop  Implantation;  Surgeon: Lue Legions, MD;  Location: [facility] ELECTROPHYSIOLOGY LAB;  Service: Electrophysiology;  Laterality: N/A;    LOOP REMOVAL Left 04/09/2021    Procedure: Loop Removal;  Surgeon: Rory Osier, MD;  Location: [facility] SU[facility];  Service: Electrophysiology;  Laterality: Left;    submastoid gland removal      THORACOSCOPY Right 11/23/2016    Procedure: RIGHT SIDED VATS MIDDLE LOBE LOBECTOMY,HILAR MEDIASTINAL LYMPH NODE DISSECTION;  Surgeon: Nena Humbles, MD;  Location: [facility] OR;  Service: Thoracic;  Laterality: Right;    THORACOSCOPY LOBECTOMY VIDEO ASSISTED Left 11/18/2021    Procedure: Left Thoracoscopic Lingulectomy;  Surgeon: Nena Humbles, MD;  Location: [facility] OR;  Service: General;  Laterality: Left;    TONSILLECTOMY      UVULOPALATOPHARYNGOPLASTY          Meds: Encounter Medications[1]  Allergies: Penicillins, Adhesive, Ciprofloxacin , Formaldehyde analogues, Nsaids (non-steroidal anti-inflammatory drug), Promethazine  hcl, Latex, and Sulfa (sulfonamide antibiotics)  Social Hx: Social History[2]  FHx: family history includes Diabetes in her son. She was adopted.      VITAL SIGNS  BP 126/70 (BP Location: Right upper arm, Patient Position: Sitting, BP Cuff Size: Regular)   Pulse 81   Temp 97.6 F (36.4 C) (Temporal)   Ht 5' 7 (1.702 m)   Wt 184 lb 9.6 oz (83.7 kg)   SpO2 93%   BMI 28.91 kg/m     GENERAL EXAM:  General: no acute distress      NEURO EXAM  Mental Status  Language: no aphasia  Atten/concentration: awake and alert   Orientation: oriented x3   Fund of Knowledge: grossly intact  Memory: Recent and remote memory intact    Cranial Nerves:  Visual Fields: full to Teacher, early years/pre Acuity:  Grossly intact OU;    CN 3,4,6 (EOM): extraocular movements intact, normal gaze alignment, and no nystagmus   Cranial Nerve 7 (Facial): normal facial symmetry and strength   CN 7,9/10,12: No dysarthria    Motor Exam:  Bulk and tone normal, There is no Pronator Drift, Fine finger movements and foot taps are brisk bilat.       Sensation: Intact to light touch       Coordination: Negative Romberg.     Gait: Normal    STUDIES REVIEWED:     LABS:  Lab Results   Component Value Date    HGBA1C 5.5  02/19/2017     Lab Results   Component Value Date    CHOLTOT 174 02/22/2017    TRIG 249 (H) 02/22/2017    HDL 41 (L) 02/22/2017    LDL 83 02/22/2017     No results found for: TSH, T3TOTAL, T4TOTAL, THYROIDAB      IMAGING (I  personally reviewed all neuroimaging):      MRI (02/09/23):   1.  No evidence of intracranial metastatic disease   2.  Mild small vessel disease and mild atrophy.   3.  Prior right cerebellar and bilateral internal capsular lacunar infarcts.             ASSESSMENT/PLAN:  In summary, Joanna Lopez is a 73 y.o. female with a history of well controlled hypertension, hyperlipidemia, who initially presented with right CN 4 palsy and episodes of metallic taste; her lung mass was diagnosed as cancer and she has undergone treatment.  The CN 4 palsy resolved with time (?and cancer treatment), but she returns with new episodes of metallic taste + cognitive slowing. We would recommend / have instituted the following plans.     # recurrent spells of metallic taste and cognitive impairment.   - doubt TIA given semiology atypical for vascular distribution.   - given history of lung cancer and similar spells in the past, would favor seizure (migraine variant less likely).    - MRI brain with and without   - EEG  - referral to epilepsy    Followup:   Tel appt with me to ensure that testing does not go un-reviewed    Sincerely,   Beverley Friedlander MD, PhD  Attending, Vascular Neurology  Neuro-interventional Surgery  [facility]       I spent 60 minutes speaking with the patient, conducting an interview, performing a limited exam, and educating the patient on my assessment and plan as well as risk factors for stroke / hemorrhage and the need to call 911. I also spent 12 minutes, on the same day as the encounter, preparing to see the patient (eg, review of tests), counseling and educating the family/caregiver , ordering medications, tests, or procedures, referring and communicating with  other health care professionals  and documenting clinical information in the electronic or other health record.         [1]   Outpatient Encounter Medications as of 10/26/2023   Medication Sig Dispense Refill    aspirin  325 MG tablet Take 1 tablet (325 mg total) by mouth daily. 30 tablet 0    atorvastatin  (LIPITOR ) 80 MG tablet Take 1 tablet (80 mg total) by mouth every morning.      BIOTIN ORAL Take 1 tablet by mouth daily.      busPIRone  (BUSPAR ) 10 MG tablet Take 1 tablet (10 mg total) by mouth in the morning and at bedtime.      calcium -vitamin D  (OSCAL-500 + D) 500 mg-5 mcg (200 unit) per tablet Take 1 tablet by mouth daily. (Patient taking differently: Take 1 tablet by mouth if needed.)      gabapentin  (NEURONTIN ) 300 MG capsule Take 1 capsule (300 mg total) by mouth 3 times a day. 90 capsule 1    leucovorin-pyridox-mecobalamin (FOLINIC-PLUS) 4-50-2 mg Tab Take 1 tablet by mouth daily.      lipase /protease /amylase  (CREON  ORAL) Take 1 capsule by mouth 3 times a day as needed (for abdominal cramping). Take prior to meals      lisinopriL  (PRINIVIL ) 10 MG tablet Take 1 tablet (10 mg total) by mouth daily.      loperamide  (IMODIUM ) 2 mg capsule Take 1-2 capsules (2-4 mg total) by mouth 4 times a day as needed for Diarrhea. Take 4 mg (2 capsules) after the first loose stool and then 1 capsule for each loose stool thereafter      MELATONIN ORAL  Take 10 mg by mouth at bedtime as needed (sleep).      multivitamin (THERAGRAN) tablet Take 1 tablet by mouth daily.      ondansetron  (ZOFRAN ) 8 MG tablet Take 1 tablet (8 mg total) by mouth every 8 hours as needed for Nausea. 30 tablet 3    PARoxetine  (PAXIL ) 20 MG tablet Take 1 tablet (20 mg total) by mouth at bedtime.      polyethylene glycol (MIRALAX ) 17 gram/dose powder Take 17 g by mouth daily as needed (constipation).      roPINIRole  (REQUIP ) 1 MG tablet Take 1 tablet (1 mg total) by mouth at bedtime. Indications: Restless Legs Syndrome      [EXPIRED] cefdinir  (OMNICEF )  300 MG capsule Take 1 capsule (300 mg total) by mouth 2 times a day for 21 days. (Patient not taking: Reported on 10/25/2023) 42 capsule 0    [EXPIRED] metroNIDAZOLE  (FLAGYL ) 500 MG tablet Take 1 tablet (500 mg total) by mouth 3 times a day for 21 days. (Patient not taking: Reported on 10/25/2023) 63 tablet 0     No facility-administered encounter medications on file as of 10/26/2023.   [2]   Social History  Tobacco Use    Smoking status: Never    Smokeless tobacco: Never   Vaping Use    Vaping status: Never Used   Substance Use Topics    Alcohol use: Not Currently     Comment: occasionally    Drug use: No

## 2023-10-26 NOTE — Patient Instructions (Addendum)
 An After Visit Summary was printed and given to the patient.    # recurrent spells of metallic taste and cognitive impairment.   - doubt TIA given semiology atypical for vascular distribution.   - given history of lung cancer and similar spells in the past, would favor seizure (migraine variant less likely).    - MRI brain with and without   - EEG  - referral to epilepsy    MRI Head ordered. Please call [phone]) to schedule appointment.    Referral to epilepsy clinic. For appointments, please call [phone].    EEG ordered. Please call [phone]) to schedule an appointment.    2 month telephone follow up scheduled for 12/29/23.

## 2023-11-01 NOTE — Telephone Encounter (Signed)
 Joanna Lopez called the Access team stating that she is in unable to make her upcoming appt with Joanna Lopez.     Patient is looking to reschedule with Joanna Lopez at [facility]     Please advise for scheduling   Joanna Lopez   [phone]

## 2023-11-01 NOTE — Telephone Encounter (Signed)
 Spoke with patient and rescheduled ov with Asberry for 7/23

## 2023-11-03 ENCOUNTER — Ambulatory Visit: Payer: MEDICARE | Attending: Acute Care

## 2023-11-03 NOTE — Progress Notes (Unsigned)
 GASTROENTEROLOGY OFFICE VISIT    Joanna Lopez is 73 y.o. female presents for EPV- pancreatic insufficiency, referred per Vernell Herring        PMH: lung cancer (s/p VATS, chemo) 2023, HTN, CKD stage III, pancreatic insufficiency, GERD, anxiety, basal cell cancer, HLD, OSA, RLS   PSH: appendectomy, tonsillectomy, uvulopalatopharyngoplasty, sub mastoid gland removal, thoracosocpy   Fam Hx: unknown, patient is adopted. Son with diabetes  Social: no alcohol or tobacco use    Interval hx (11/01/2023)    Reflux?  BMs?  Dysphagia?  Appetite?  Weight changes?  Pain?  N/V/D?  Medication changes, ABX, NSAIDs, pain meds?  Diet/Caffeine/soda?       Procedure history:    EGD/Colonoscopy 11/2011:  DESCRIPTION OF PROCEDURE:    The Olympus GIF videoendoscope was used. The endoscope was advanced without difficulty  into the esophagus. The esophagus showed some mild distal esophagitis. The stomach was  entered. The fundus was normal. The body and antrum showed some mild diffuse gastritis.  Pylorus was patent. The duodenal bulb showed some mild inflammatory change with some  erythema, but no ulceration. The distal duodenum appeared normal. A biopsy was obtained  of the distal duodenum to assess for atrophy or other changes. The endoscope was then  withdrawn. A biopsy was obtained of the antrum to assess for Helicobacter. The  endoscope was then withdrawn and retroflexed. Proximal stomach showed moderate sized  hiatal hernia. The endoscope was then withdrawn.    Colonoscopy was then carried out using Olympus CF videocolonoscope. Colonoscope was  advanced to the cecum in a well prepped. Cecum was identified by the appendiceal orifice  and the ileocecal valve. The mucosa in the cecum was normal. The ascending colon  appeared normal. Transverse colon was normal. The descending colon and sigmoid colon  showed extensive diverticular disease. In the distal sigmoid colon, there was a small 1  cm polyp that was snared and removed  with hot polypectomy snare. Further withdrawal  revealed some diverticulosis beyond this, but was otherwise unremarkable.    ASSESSMENT:    1. Gastritis.  2. Esophagitis.  3. Colon polyp.  4. Diverticulosis.     Final Pathologic Diagnosis        A.          Duodenum, biopsy:        -     Small intestinal mucosa with no diagnostic abnormality.      -     The villous architecture is preserved.        -     No inflammatory lesions are identified, including increased  intraepithelial lymphocytes.        -     No evidence of dysplasia or malignancy.    B.          Stomach, antrum, biopsy:         -     Chronic active gastritis.    -     H. pylori are identified on an immunostained slide.    -     No evidence of intestinal metaplasia, dysplasia, or malignancy.    C.          Colon, sigmoid, biopsy:          -     Polypoid fragment of colonic mucosa with no diagnostic abnormality.    -     No inflammatory lesions are identified.    -     No evidence of dysplasia or malignancy.  Colonoscopy: (2014 Dr. Adele 2/2 chronic diarrhea)  IMPRESSION:  Normal colonoscopy.  Random colon biopsies are pending at this  time.  If these biopsies are normal, we would do some stool testing to  further evaluate the cause of her diarrhea.  FINAL DIAGNOSIS:   Colon, random biopsy:   - Colonic mucosa with focal edema and hemorrhage.   - No evidence of microscopic colitis, active inflammation or chronic glandular injury.     Testing history:    CT A/P 09/13/2023:    FINDINGS:    Lower chest: The chest findings are reported separately.    Liver: Normal morphology and attenuation.  *  There are multiple bilateral simple hepatic cysts. The largest is in segment 8 and measures 7.2 cm.  *  Arising from segment 3, there is an exophytic septated cystic lesion measuring 7.1 x 5 cm. (Series 306 image 126). This is not significantly changed in size since the recent comparison exam. The previously seen fat stranding adjacent to this cyst is  mildly decreased. There are a few thin septations. No enhancing nodule.  *  There is a new cystic focus abutting the septated cystic lesion medially in segment 3 which measures 4.6 x 3.2 cm. There is mild thickening of the cyst wall. There is mild adjacent hyperemia best seen on the coronal images. (Series 605 image 40). (Series 306 image 122).    Biliary tree:No biliary ductal dilation. Normal gallbladder.    Spleen: Normal    Pancreas: Normal    Adrenal glands: Normal    Kidneys/Ureters/Bladder: Normal size without hydronephrosis.  No stones.  Multiple bilateral renal sinuses, left greater than right. Exophytic cortical cyst arising from the lower pole the right kidney measuring 3 cm.. No mass lesions. Normal appearance of the urinary bladder.    Gastrointestinal tract: Normal caliber and wall thickness. Colonic diverticulosis. Normal appendix.    Lymphatics: No lymphadenopathy.    Vasculature:  *  Mild atherosclerotic changes of the abdominal aorta and branch vessels. No aneurysm.  *  There are accessory left and right hepatic arteries arising from the left gastric and superior mesenteric arteries, respectively.  *  Portal vein, splenic vein and superior mesenteric vein are patent.    Peritoneum/Retroperitoneum: No free fluid, focal fluid collections or free air.    Genital Organs: Normal    Abdominal wall/Soft tissues: Normal    Osseous structures: No acute osseous abnormalities or suspicious osseous lesions. Degenerative changes in the spine.      Impression   IMPRESSION:  Smaller size of the multiseptated cystic lesion in segment 3 of the liver with decrease of the adjacent inflammatory change. There is interval development of a new thick-walled cystic lesion medially in segment 3 which may represent abscess.     CT A/P 08/25/2023:  ABDOMEN AND PELVIS:    LIVER: Multiple hepatic cystic lesions, including a multilobulated lesion in the left hepatic lobe measuring 5 x 7.4 cm (series 305 image 32). This lesion is  slightly increased from 3.7 x 6.7 cm on 12/29/2016. There are suspected internal septations without discrete enhancing nodule identified. There is adjacent fat stranding to this lesion (series 305 image 33), new from prior. Additional scattered subcentimeter low-density lesions liver too small to characterize but also favored to represent small cysts or hemangiomas.      GALLBLADDER AND BILIARY TREE: No intra and extra-hepatic biliary ductal dilation. Normal gallbladder.    SPLEEN: Not enlarged.    PANCREAS: Unremarkable.    ADRENAL GLANDS:  Unremarkable.    KIDNEYS, URETERS AND BLADDER: Symmetric renal enhancement. Right parapelvic and lower pole cysts. Left parapelvic renal cysts. No hydronephrosis. The urinary bladder is unremarkable.      GASTROINTESTINAL TRACT: Gastric, small bowel, colonic caliber and wall thickness are within normal limits. Appendix is not visualized; surgically absent by history. Colonic diverticulosis without evidence of acute diverticulitis.    LYMPHATICS: No lymphadenopathy.    VASCULATURE: Abdominal aorta is normal in caliber with minimal calcified atherosclerosis. Portal vein is patent.    PERITONEUM/RETROPERITONEUM: No free air or loculated fluid collections.    ABDOMINAL WALL/SOFT TISSUES: Unremarkable.    GENITAL STRUCTURES:  Uterus and ovaries are atrophic.    OSSEOUS STRUCTURES: No suspicious osseous lesion or acute osseous abnormality.    Impression   IMPRESSION:    ABDOMEN AND PELVIS  Mild inflammatory changes adjacent to a multilobulated septated left hepatic cystic lesion; nonspecific and may represent infection or inflammation of the cystic lesion versus recent rupture.          Component  Ref Range & Units (hover) 09/22/23  1:21 PM   Total Bilirubin 0.5   Bilirubin, Direct 0.11   AST (SGOT) 25   ALT 18   Alkaline Phosphatase 69   Total Protein 6.6   Albumin 3.9   Bilirubin, Indirect 0.39          Component  Ref Range & Units (hover) 02/10/13 10:05 AM Comments   Pancreatic  Elastase-1 181 Low         Severe Pancreatic Insufficiency:          <100         Moderate Pancreatic Insufficiency:   100 - 200         Normal:                                   >200                 ROS     Physical Exam     No results for input(s): WBC, HGB, HCT, MCV, PLT in the last 72 hours.  No results for input(s): NA, K, CL, CO2, PHOS, BUN, CREATININE in the last 72 hours.    Invalid input(s): CA  No results for input(s): AST, ALT, BILITOT, ALKPHOS in the last 72 hours.    Invalid input(s): ALB, BILIDIR  No results for input(s): LIPASE , AMYLASE  in the last 72 hours.  No results for input(s): PROT, INR in the last 72 hours.  No results for input(s): PTT in the last 72 hours.  No results for input(s): OCCULTBLD in the last 72 hours.  No results for input(s): AMMONIA in the last 72 hours.    @MEDICATIONSWITHREFILLS @          Assessment/Plan:     73 y.o. yo female here for EPV with Asberry Shawl, CNP to discuss exocrine pancreatic insufficiency    EPI  Chronic diarrhea   - seen in 2014 for chronic diarrhea- having multiple malodorous stools per day   - Pancreatic elastase checked at that time - low at 181   - No documented history of pancreatitis on chart   - No alcohol or tobacco use   - Most recent CT A/P 09/2023 with normal pancreas, normal biliary tree- non-distended, normal gallbladder   - No record of EUS on chart   - Following with hepatology for newly found hepatic cystic  lesions   - Recommend check vit ADEK, folic acid , vit B12, iron studies + ferritin  - Check pre-albumin  - Normal renal panel and LFTs 09/2023- will no recheck at this time  - Continue creon   - Plan for EUS in one year to further evaluate pancreas  - Follow up in one year or earlier if patient develops worsening symptoms    There are no diagnoses linked to this encounter.     RTO ***      Joanna Lapinsky MARIE Kyree Adriano, CNP   11/01/2023

## 2023-11-29 ENCOUNTER — Ambulatory Visit: Admit: 2023-11-29 | Discharge: 2023-12-03 | Payer: MEDICARE

## 2023-11-29 ENCOUNTER — Other Ambulatory Visit: Admit: 2023-11-29 | Payer: MEDICARE

## 2023-11-29 DIAGNOSIS — K7689 Other specified diseases of liver: Principal | ICD-10-CM

## 2023-11-29 DIAGNOSIS — N179 Acute kidney failure, unspecified: Principal | ICD-10-CM

## 2023-11-29 DIAGNOSIS — N1831 Chronic kidney disease, stage 3a (CMS-HCC): Principal | ICD-10-CM

## 2023-11-29 LAB — URINALYSIS W/RFL TO MICROSCOPIC
Bilirubin, UA: NEGATIVE
Blood, UA: NEGATIVE
Glucose, UA: NEGATIVE mg/dL
Ketones, UA: NEGATIVE mg/dL
Leukocyte Esterase, UA: NEGATIVE
Nitrite, UA: NEGATIVE
Protein, UA: NEGATIVE mg/dL
Specific Gravity, UA: 1.021 (ref 1.005–1.035)
Urobilinogen, UA: <2 mg/dL (ref 0.2–1.9)
pH, UA: 5.5 (ref 5.0–8.0)

## 2023-11-29 LAB — CBC
Hematocrit: 39.3 % (ref 35.0–45.0)
Hemoglobin: 13.2 g/dL (ref 11.7–15.5)
MCH: 31.4 pg (ref 27.0–33.0)
MCHC: 33.5 g/dL (ref 32.0–36.0)
MCV: 93.9 fL (ref 80.0–100.0)
MPV: 9.8 fL (ref 7.5–11.5)
Platelets: 176 10E3/uL (ref 140–400)
RBC: 4.18 10E6/uL (ref 3.80–5.10)
RDW: 15.3 % — ABNORMAL HIGH (ref 11.0–15.0)
WBC: 10.1 10E3/uL (ref 3.8–10.8)

## 2023-11-29 LAB — QUANTIFERON NIL: QuantiFERON Nil: 0.01

## 2023-11-29 LAB — RENAL FUNCTION PANEL W/EGFR
Albumin: 4.2 g/dL (ref 3.5–5.7)
Anion Gap: 10 mmol/L (ref 3–16)
BUN: 27 mg/dL — ABNORMAL HIGH (ref 7–25)
CO2: 23 mmol/L (ref 21–33)
Calcium: 9.6 mg/dL (ref 8.6–10.3)
Chloride: 107 mmol/L (ref 98–110)
Creatinine: 1.18 mg/dL (ref 0.60–1.30)
EGFR: 49
Glucose: 83 mg/dL (ref 70–100)
Osmolality, Calculated: 294 mosm/kg (ref 278–305)
Phosphorus: 3.8 mg/dL (ref 2.1–4.5)
Potassium: 4.4 mmol/L (ref 3.5–5.3)
Sodium: 140 mmol/L (ref 133–146)

## 2023-11-29 LAB — QUANTIFERON TB2 AG: QuantiFERON TB2 Ag Value: 0.01

## 2023-11-29 LAB — QUANTIFERON TB1 AG: QuantiFERON TB1 Ag Value: 0.02

## 2023-11-29 LAB — QUANTIFERON MITOGEN
QuantiFERON Interpretation: NEGATIVE
QuantiFERON Mitogen: 5.97

## 2023-11-29 NOTE — Progress Notes (Signed)
 Renal Outpatient Initial Evaluation      Seen By Nephrology For Chief Complaint of : Decreased EGFR    Referred by : Dr. Patsie    History (HPI) :  Joanna Lopez is an 73 y.o. female who was referred by her primary care physician on insistence of the patient who noticed that her estimated GFR was slowly declining on the renal panel that she was observing in my chart.  Patient has a history of hypertension that has been relatively well-controlled.  Had no issues with any hospitalization.  She also had obstructive sleep apnea that was amenable to therapy with reconstructive surgery of her upper airways.  Patient also had a stroke about 5 years ago where she had crossing of her eyes as well as difficulty in speech.  All the symptoms improved on their own.  Of note is the fact that she had history of lung cancer that required partial lobectomy.  She also followed this up with cisplatin  and experimental drug and chemotherapy at that time.  Patient has been cancer free.  She does not do any over-the-counter NSAIDs.  SHe is very health-conscious.  No recent hospitalization    Worried about that her kidneys might fail.  Hence the reason for the referral.        Past Medical History:   Diagnosis Date    Anxiety     Basal cell cancer     Cataract     Hyperlipidemia     Hypertension     Lung cancer (CMS-HCC)     OSA (obstructive sleep apnea)     RLS (restless legs syndrome)     Stroke (CMS-HCC)     5 yrs ago, TIA X 2     Past Surgical History:   Procedure Laterality Date    APPENDECTOMY      CATARACT EXTRACTION, BILATERAL Bilateral 07/2021    COLONOSCOPY N/A 01/23/2013    Procedure: COLONOSCOPY WITH MAC;  Surgeon: Rankin Dellen, MD;  Location: [facility] ENDOSCOPY;  Service: Gastroenterology;  Laterality: N/A;    FLEXIBLE BRONCHOSCOPY W/ UPPER ENDOSCOPY N/A 09/17/2016    Procedure: BRONCHOSCOPY SUPER D WITH FLUORO AND BIOPSY-EBUS/NAVI;  Surgeon: Sadia Benzaquen, MD;  Location: [facility] ENDOSCOPY;  Service: Pulmonary;   Laterality: N/A;    INSERTION CATHETER VASCULAR ACCESS N/A 12/26/2021    Procedure: INSERTION POWER PORT A CATH;  Surgeon: Lucendia Blanch, MD;  Location: [facility] OR;  Service: General;  Laterality: N/A;    LOOP IMPLANT IMPLANTATION N/A 04/07/2017    Procedure: Loop  Implantation;  Surgeon: Lue Legions, MD;  Location: [facility] ELECTROPHYSIOLOGY LAB;  Service: Electrophysiology;  Laterality: N/A;    LOOP REMOVAL Left 04/09/2021    Procedure: Loop Removal;  Surgeon: Rory Osier, MD;  Location: [facility] MIS SUITE;  Service: Electrophysiology;  Laterality: Left;    submastoid gland removal      THORACOSCOPY Right 11/23/2016    Procedure: RIGHT SIDED VATS MIDDLE LOBE LOBECTOMY,HILAR MEDIASTINAL LYMPH NODE DISSECTION;  Surgeon: Nena Humbles, MD;  Location: [facility] OR;  Service: Thoracic;  Laterality: Right;    THORACOSCOPY LOBECTOMY VIDEO ASSISTED Left 11/18/2021    Procedure: Left Thoracoscopic Lingulectomy;  Surgeon: Nena Humbles, MD;  Location: [facility] OR;  Service: General;  Laterality: Left;    TONSILLECTOMY      UVULOPALATOPHARYNGOPLASTY       Family History   Adopted: Yes   Problem Relation Age of Onset    Diabetes Son      Social History[1]  ROS.  General :  No fevers chills sweats, anorexia, unexpected weight change  Eyes :   No pain photophobia   ENT :   No ear pain, hearing loss    No mouth sores, nasal sores, or sinusitis.  CV :   No chest pain, palpitations,     No SOB, SOBOE, orthopnoea, PND,     No ankle swelling  RS :   No cough, wheeze, sputum, haemoptysis.  GIT :   No N, V, abdominal pain, C, D,   GUT :   No dysuria, frequency, urgency, heamaturia, flank pain.    MSK :   No muscular pain, cramps, weakness,   Neuro :  No faints, falls, fits, HA,   Skin :   No rash, itch,   Haem : No lumps, bruising, bleeding,   Endo :   No cold/ heat intolerance, polyuria, polydipsia,  All/Imm :  No persistent infections, uriticaria  Psych :  No anxiety, depressed mood, memory loss.      PHYSICAL EXAM    See vitals recorded in the  chart.    Constitutional :Alert, appropriate, no acute distress,     well developed, nourished, hydrated,    Skin :   No rash, lesions, warm to touch  Head :   atraumatic, normocephalic.  Eyes :   no injection, icterus, clinical anaemia,     EOM intact grossly, vision grossly normal.    PERRLA, No nystagmus.  ENT :   external ears, nose & tongue normal,     no gum disease, dentition grossly intact  Neck :   supple, No Thy mass, Tracheal shift, elevated JVD  Chest :  No respiratory distress, Percussion note normal,    Bilat. clear to auscultation, BS vesicular + 0   CVS :   HR regular, HS S1 S2     No murmur  no rub    LE edema none  Pulses :  present, normal, equal bilaterally  ABD :   non-distended, soft, non-tender,     no mass, no palpable organomegaly,     no briut, BS normal.  Spine :  no kyphosis, no scoliosis  Extremities :  No joint swelling, muscle wasting,   Neuro :  CN 3-12 grossly intact, UE & LE power and sensation grossly intact,   Psych :  mood and affect appropriate, normal interaction, orientation, eye contact.    Allergies[2]    Home Medications   Medication Sig Taking? Last Dose   aspirin  325 MG tablet Take 1 tablet (325 mg total) by mouth daily.     atorvastatin  (LIPITOR ) 80 MG tablet Take 1 tablet (80 mg total) by mouth every morning.     BIOTIN ORAL Take 1 tablet by mouth daily.     busPIRone  (BUSPAR ) 10 MG tablet Take 1 tablet (10 mg total) by mouth in the morning and at bedtime.     calcium -vitamin D  (OSCAL-500 + D) 500 mg-5 mcg (200 unit) per tablet Take 1 tablet by mouth daily.  Patient taking differently: Take 1 tablet by mouth if needed.     gabapentin  (NEURONTIN ) 300 MG capsule Take 1 capsule (300 mg total) by mouth 3 times a day.     leucovorin-pyridox-mecobalamin (FOLINIC-PLUS) 4-50-2 mg Tab Take 1 tablet by mouth daily.     lipase /protease /amylase  (CREON  ORAL) Take 1 capsule by mouth 3 times a day as needed (for abdominal cramping). Take prior to meals     lisinopriL  (PRINIVIL ) 10 MG  tablet Take 1 tablet (10 mg total) by mouth daily.     loperamide  (IMODIUM ) 2 mg capsule Take 1-2 capsules (2-4 mg total) by mouth 4 times a day as needed for Diarrhea. Take 4 mg (2 capsules) after the first loose stool and then 1 capsule for each loose stool thereafter     MELATONIN ORAL Take 10 mg by mouth at bedtime as needed (sleep).     multivitamin (THERAGRAN) tablet Take 1 tablet by mouth daily.     ondansetron  (ZOFRAN ) 8 MG tablet Take 1 tablet (8 mg total) by mouth every 8 hours as needed for Nausea.     PARoxetine  (PAXIL ) 20 MG tablet Take 1 tablet (20 mg total) by mouth at bedtime.     polyethylene glycol (MIRALAX ) 17 gram/dose powder Take 17 g by mouth daily as needed (constipation).     roPINIRole  (REQUIP ) 1 MG tablet Take 1 tablet (1 mg total) by mouth at bedtime. Indications: Restless Legs Syndrome           DATA :     Lab Results   Component Value Date    CREATININE 1.44 (H) 09/17/2023    BUN 32 (H) 09/17/2023    NA 140 09/17/2023    K 4.3 09/17/2023    CL 108 09/17/2023    CO2 22 09/17/2023     Lab Results   Component Value Date    CALCIUM  9.0 09/17/2023    PHOS 3.7 09/14/2023     No results found for: [facility]  Lab Results   Component Value Date    WBC 11.0 (H) 09/17/2023    HGB 13.3 09/17/2023    HCT 38.4 09/17/2023    MCV 89.4 09/17/2023    PLT 164 09/17/2023     Lab Results   Component Value Date    IRON 86 06/08/2013    TIBC 336 06/08/2013    FERRITIN 67.2 06/08/2013     Lab Results   Component Value Date    KETONESU Negative 08/25/2023    BLOODU Negative 08/25/2023    BILIRUBINUR Negative 08/25/2023    UROBILINOGEN 2.0 (H) 08/25/2023    PROTEINUA Trace (A) 08/25/2023    NITRITE Negative 08/25/2023    LEUKOCYTESUR Small (A) 08/25/2023    PHUR 6.0 08/25/2023    LABSPEC 1.027 08/25/2023    CLARITYU Clear 08/25/2023    COLORU Straw 08/25/2023         In addition to the above, I have reviewed an extensive amount of complex data.    Morbidity / complication risk is felt to be :   moderate      Summary :  Joanna Lopez is an 73 y.o. female.  With history of declining EGFR repeat of time.  Risk factors being high blood pressure, history of lung cancer requiring multiple imagings as well as cisplatin -based chemotherapy with remission of the cancer completely.  Other contributing factor could be dietary.  See the discussion below.      Assessment:       CKD: Stage III based on estimated GFR of 57.    Electrolytes : Normal  Acid/Base normal bicarb  Vol. Status euvolemic  BP : Very well-controlled  Anemia not an issue at this point  SHPT : Calcium  phosphorus normal  Education: Educated patient about the CKD.  In addition to avoiding over-the-counter NSAIDs educated about the diet.  She needs to avoid red meats.  Also dark-colored colas which she lowers.  She will switch over to  plain water  or flavored water  as a substitute.    Also avoid angiographic dyes and if necessary take prophylaxis.    Try to keep an ideal body weight by moderate exercise.    All her questions have been answered.  In order to complete the workup we will repeat the CBC renal panel and renal ultrasound.  Will call with the results.    Plan:  Follow-up in 6 months unless results show otherwise.  Patient is can communicate in between using MyChart if necessary.              Pearla Oyster, MD  11/29/2023    This note was completely edited, written and reviewed by me and consists of information cut and pasted from the my most recent visit, my smart phrases and other Epic tools. I have personally reviewed all aspects of this note to at least include reviewing this patient's chart and problem list, updating the history, physical exam, lab and procedure results, and assessment and plan as detailed above and below.  As such this visit note reflects my current evaluation and management for this patient.        [1]   Social History  Tobacco Use    Smoking status: Never    Smokeless tobacco: Never   Vaping Use    Vaping status:  Never Used   Substance Use Topics    Alcohol use: Not Currently     Comment: occasionally    Drug use: No   [2]   Allergies  Allergen Reactions    Penicillins Anaphylaxis     Facial Swelling  immediate rash, facial/tongue/throat swelling, SOB or lightheadedness with hypotension,   severe rash involving mucus membranes or skin necrosis    Adhesive      Other reaction(s): breaks out and itches if on for long time    Ciprofloxacin  Other (See Comments)     Tendonitis.     Formaldehyde Analogues     Nsaids (Non-Steroidal Anti-Inflammatory Drug)      GI issues    Promethazine  Hcl      Restless legs    Latex Rash    Sulfa (Sulfonamide Antibiotics) Rash

## 2023-12-01 ENCOUNTER — Ambulatory Visit: Admit: 2023-12-01 | Discharge: 2023-12-06 | Payer: MEDICARE | Attending: Acute Care

## 2023-12-01 DIAGNOSIS — R197 Diarrhea, unspecified: Principal | ICD-10-CM

## 2023-12-01 MED ORDER — lipase-protease-amylase (CREON) 24,000-76,000 -120,000 unit CpDR
24000-76000 | ORAL_CAPSULE | Freq: Three times a day (TID) | ORAL | 3 refills | 30.00000 days | Status: AC | PRN
Start: 2023-12-01 — End: ?

## 2023-12-01 NOTE — Progress Notes (Signed)
 GASTROENTEROLOGY OFFICE VISIT    Joanna Lopez is 73 y.o. female presents for NPV- EPI, referred per Vernell Herring, CNP        PMH: GERD, lung nodule with hx malignant neoplasm S/p VATS and chemo, acute respiratory insufficiency, HTN, CVA, liver lesion, anxiety, basal cell carcinoma, hLD, HTN, OSA, RLS  PSH: thorarcoscopy lobectomy, cataract extraction, loop implant with removal, bronchoscopy, colonoscopy, appendectomy, tonsillectomy, uvulopalatopharyngoplasty   Fam Hx: diabetes  Social: no history of alcohol or tobacco use on chart    Interval hx (11/29/2023)    Patient presents for 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   IR was consulted for evaluation for drainage who recommended monitoring and antibiotics    Patient reports she has had a history of chronic diarrhea- previously following with Dr. Adele  States that she had issues with diarrhea until she was stung by a wasp and got cellulitis which was treated with Keflex which improved the diarrhea  She had another cellulitis infection   Reports significant urgency and nocturnal episodes and occasional incontinence issues  She has a bowel movement every two to three days because she uses imodium  regularly- she takes two tablets per day and if she has an episode of diarrhea she will take another  Denies melena or hematochezia  Denies abdominal pain  Denies nausea, vomiting, heart burn or indigestion  No changes to appetite or weight loss- states that she did lose some weight due to cancer  Takes a full dose ASA daily, no anticoagulation or NSAIDs  Occasional alcohol, no tobacco  products  Recently on antibiotics for hepatic cysts, no other new medications or narcotics  Drinks plenty of water , minimal caffeine- she does drink a few sodas a weeks  Last colonoscopy in 2014, EGD in 2013  Denies shortness of breath, cough, fevers or chills  She is still taking creon - but is currently out of it      Previously following with Dr. Adele for chronic diarrhea, last seen in 2015. 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   Had EGD, SBBx's, colonoscopy at [facility] and recent 9/14 colon biopsies all nondiagnostic. OSH noncontrast CT scan was normal.   Continued to have about 3-7x/day, and worse after dairy. Described foul odor to BMs and significant gassiness but not losing weight.   Also mentions that she needs to avoid beans, onions and cabbage.   On bentyl  20mg  po bid, she had some formed BMs, but still quite frequent. No response previously to probiotic.   Symptoms got worse after trip to [place] 44yrs ago.   No abdominal pains. No weight loss. Still has GB. Never smoker; modest alcohol.    fecal fat was normal but fecal elastase a bit low.   We stopped bentyl  and started 2 creons during meals.   Diarrhea down to 3-5x/d, consistency of thick paste, and appetite down.  OSH CT from 3/14 which was chest CT brought by pt which showed some liver cysts and pancreatic tail looked normal but Dr. Adele didn't see much of the intestine or right side of the pancreas.  We ordered CT abd to evaluate  further and recommended holding lipitor /zocor incase these were causing symptoms.   CT was non-diagnostic. Serologic testing to evaluate for neuroendocrine causes which were normal.   She felt diarrhea improved slightly with holding lipitor  but was still having 3 loose stools a day (down from 5).  We discussed starting levsin  bid.   Here today for f/u.   Reports improvement of diarrhea with levsin  2-3 x day in addition to the Creon  and has been having 1-3 soft/loose Bm's a  day.   No orange, oily, melena or bloody stools.  About a month ago began to have some nausea and decreased apeptite and weight has been dropping.   A few weeks ago began to have some nausea and then had an episode of severe abd pain left side radiating to mid abd with n/v and diarrhea when she was out of town.   Now nauseated all the time and worse after eating and gets left sided pains after eating lasting a few minutes and then slowly resolves. Pain can occur unrelated to eating also.   She isn't sure if the levsin  helps with the pain.   Bm's have no effect.  Increased amt of dry heaving.   No heartburn, regurgitation or dysphagia.  Using Creon  1-2 pills with meals which she takes during the meal, feels like it helped her bloating/gassines.  No known fevers, but tells me she gets hot flashes which isn't new for her.   Weight down 8 lbs.   Started on paxil  for anxiety  2-3 weeks ago.           Procedure history:    EGD/Colonoscopy 11/2011:  DESCRIPTION OF PROCEDURE:    The Olympus GIF videoendoscope was used. The endoscope was advanced without difficulty  into the esophagus. The esophagus showed some mild distal esophagitis. The stomach was  entered. The fundus was normal. The body and antrum showed some mild diffuse gastritis.  Pylorus was patent. The duodenal bulb showed some mild inflammatory change with some  erythema, but no ulceration. The distal duodenum appeared normal. A biopsy was obtained  of the distal duodenum to assess for atrophy or other changes. The endoscope was then  withdrawn. A biopsy was obtained of the antrum to assess for Helicobacter. The  endoscope was then withdrawn and retroflexed. Proximal stomach showed moderate sized  hiatal hernia. The endoscope was then withdrawn.    Colonoscopy was then carried out using Olympus CF videocolonoscope. Colonoscope was  advanced to the cecum in a well prepped. Cecum was identified by the appendiceal orifice  and the ileocecal valve. The mucosa in the  cecum was normal. The ascending colon  appeared normal. Transverse colon was normal. The descending colon and sigmoid colon  showed extensive diverticular disease. In the distal sigmoid colon, there was a small 1  cm polyp that was snared and removed with hot polypectomy snare. Further withdrawal  revealed some diverticulosis beyond this, but was otherwise unremarkable.    ASSESSMENT:    1. Gastritis.  2. Esophagitis.  3. Colon polyp.  4. Diverticulosis.     Final Pathologic Diagnosis        A.          Duodenum, biopsy:        -     Small intestinal mucosa with no diagnostic abnormality.      -     The villous architecture is preserved.        -     No inflammatory lesions are  identified, including increased  intraepithelial lymphocytes.        -     No evidence of dysplasia or malignancy.    B.          Stomach, antrum, biopsy:         -     Chronic active gastritis.    -     H. pylori are identified on an immunostained slide.    -     No evidence of intestinal metaplasia, dysplasia, or malignancy.    C.          Colon, sigmoid, biopsy:          -     Polypoid fragment of colonic mucosa with no diagnostic abnormality.    -     No inflammatory lesions are identified.    -     No evidence of dysplasia or malignancy.         Colonoscopy: (2014 Dr. Adele 2/2 chronic diarrhea)  IMPRESSION:  Normal colonoscopy.  Random colon biopsies are pending at this  time.  If these biopsies are normal, we would do some stool testing to  further evaluate the cause of her diarrhea.  FINAL DIAGNOSIS:   Colon, random biopsy:   - Colonic mucosa with focal edema and hemorrhage.   - No evidence of microscopic colitis, active inflammation or chronic glandular injury.     Testing history:    CT A/P 09/13/2023:    FINDINGS:    Lower chest: The chest findings are reported separately.    Liver: Normal morphology and attenuation.  *  There are multiple bilateral simple hepatic cysts. The largest is in segment 8 and measures 7.2 cm.  *   Arising from segment 3, there is an exophytic septated cystic lesion measuring 7.1 x 5 cm. (Series 306 image 126). This is not significantly changed in size since the recent comparison exam. The previously seen fat stranding adjacent to this cyst is mildly decreased. There are a few thin septations. No enhancing nodule.  *  There is a new cystic focus abutting the septated cystic lesion medially in segment 3 which measures 4.6 x 3.2 cm. There is mild thickening of the cyst wall. There is mild adjacent hyperemia best seen on the coronal images. (Series 605 image 40). (Series 306 image 122).    Biliary tree:No biliary ductal dilation. Normal gallbladder.    Spleen: Normal    Pancreas: Normal    Adrenal glands: Normal    Kidneys/Ureters/Bladder: Normal size without hydronephrosis.  No stones.  Multiple bilateral renal sinuses, left greater than right. Exophytic cortical cyst arising from the lower pole the right kidney measuring 3 cm.. No mass lesions. Normal appearance of the urinary bladder.    Gastrointestinal tract: Normal caliber and wall thickness. Colonic diverticulosis. Normal appendix.    Lymphatics: No lymphadenopathy.    Vasculature:  *  Mild atherosclerotic changes of the abdominal aorta and branch vessels. No aneurysm.  *  There are accessory left and right hepatic arteries arising from the left gastric and superior mesenteric arteries, respectively.  *  Portal vein, splenic vein and superior mesenteric vein are patent.    Peritoneum/Retroperitoneum: No free fluid, focal fluid collections or free air.    Genital Organs: Normal    Abdominal wall/Soft tissues: Normal    Osseous structures: No acute osseous abnormalities or suspicious osseous lesions. Degenerative changes in the spine.      Impression   IMPRESSION:  Smaller size of the  multiseptated cystic lesion in segment 3 of the liver with decrease of the adjacent inflammatory change. There is interval development of a new thick-walled cystic lesion  medially in segment 3 which may represent abscess.     CT A/P 08/25/2023:  ABDOMEN AND PELVIS:    LIVER: Multiple hepatic cystic lesions, including a multilobulated lesion in the left hepatic lobe measuring 5 x 7.4 cm (series 305 image 32). This lesion is slightly increased from 3.7 x 6.7 cm on 12/29/2016. There are suspected internal septations without discrete enhancing nodule identified. There is adjacent fat stranding to this lesion (series 305 image 33), new from prior. Additional scattered subcentimeter low-density lesions liver too small to characterize but also favored to represent small cysts or hemangiomas.      GALLBLADDER AND BILIARY TREE: No intra and extra-hepatic biliary ductal dilation. Normal gallbladder.    SPLEEN: Not enlarged.    PANCREAS: Unremarkable.    ADRENAL GLANDS: Unremarkable.    KIDNEYS, URETERS AND BLADDER: Symmetric renal enhancement. Right parapelvic and lower pole cysts. Left parapelvic renal cysts. No hydronephrosis. The urinary bladder is unremarkable.      GASTROINTESTINAL TRACT: Gastric, small bowel, colonic caliber and wall thickness are within normal limits. Appendix is not visualized; surgically absent by history. Colonic diverticulosis without evidence of acute diverticulitis.    LYMPHATICS: No lymphadenopathy.    VASCULATURE: Abdominal aorta is normal in caliber with minimal calcified atherosclerosis. Portal vein is patent.    PERITONEUM/RETROPERITONEUM: No free air or loculated fluid collections.    ABDOMINAL WALL/SOFT TISSUES: Unremarkable.    GENITAL STRUCTURES:  Uterus and ovaries are atrophic.    OSSEOUS STRUCTURES: No suspicious osseous lesion or acute osseous abnormality.    Impression   IMPRESSION:    ABDOMEN AND PELVIS  Mild inflammatory changes adjacent to a multilobulated septated left hepatic cystic lesion; nonspecific and may represent infection or inflammation of the cystic lesion versus recent rupture.          Component  Ref Range & Units (hover) 09/22/23   1:21 PM   Total Bilirubin 0.5   Bilirubin, Direct 0.11   AST (SGOT) 25   ALT 18   Alkaline Phosphatase 69   Total Protein 6.6   Albumin 3.9   Bilirubin, Indirect 0.39          Component  Ref Range & Units (hover) 02/10/13 10:05 AM Comments   Pancreatic Elastase-1 181 Low         Severe Pancreatic Insufficiency:          <100         Moderate Pancreatic Insufficiency:   100 - 200         Normal:                                   >200          Review of Systems   Constitutional:  Negative for chills, fever and weight loss.   Respiratory:  Negative for cough and shortness of breath.    Cardiovascular:  Negative for chest pain.   Gastrointestinal:  Positive for diarrhea. Negative for abdominal pain, blood in stool, constipation, heartburn, melena, nausea and vomiting.   All other systems reviewed and are negative.       Physical Exam  Vitals reviewed.   Constitutional:       Appearance: Normal appearance.   HENT:      Head:  Normocephalic.      Nose: Nose normal.      Mouth/Throat:      Mouth: Mucous membranes are dry.      Pharynx: Oropharynx is clear.   Eyes:      Extraocular Movements: Extraocular movements intact.      Pupils: Pupils are equal, round, and reactive to light.   Cardiovascular:      Rate and Rhythm: Normal rate and regular rhythm.      Pulses: Normal pulses.   Pulmonary:      Effort: Pulmonary effort is normal.   Abdominal:      General: Bowel sounds are normal.   Musculoskeletal:         General: Normal range of motion.      Cervical back: Normal range of motion.   Skin:     General: Skin is warm and dry.   Neurological:      Mental Status: She is alert and oriented to person, place, and time.   Psychiatric:         Mood and Affect: Mood normal.         Behavior: Behavior normal.        No results for input(s): WBC, HGB, HCT, MCV, PLT in the last 72 hours.  No results for input(s): NA, K, CL, CO2, PHOS, BUN, CREATININE in the last 72 hours.    Invalid input(s): CA  No results  for input(s): AST, ALT, BILITOT, ALKPHOS in the last 72 hours.    Invalid input(s): ALB, BILIDIR  No results for input(s): LIPASE , AMYLASE  in the last 72 hours.  No results for input(s): PROT, INR in the last 72 hours.  No results for input(s): PTT in the last 72 hours.  No results for input(s): OCCULTBLD in the last 72 hours.  No results for input(s): AMMONIA in the last 72 hours.    @MEDICATIONSWITHREFILLS @      Assessment/Plan:     73 y.o. yo female here for EPV with Asberry Shawl, CNP to discuss exocrine pancreatic insufficiency    EPI  Chronic diarrhea   - seen in 2014 for chronic diarrhea- having multiple malodorous stools per day   - Pancreatic elastase checked at that time - low at 181   - No documented history of pancreatitis on chart   - No alcohol or tobacco use   - Most recent CT A/P 09/2023 with normal pancreas, normal biliary tree- non-distended, normal gallbladder   - No record of EUS on chart   - Following with hepatology for newly found hepatic cystic lesions  - Normal renal panel and LFTs 09/2023- will no recheck at this time   - Patient with reported history of EPI with normal appearing pancreas on imaging, diarrhea is possibly secondary to EPI could also consider constipation with overflow diarrhea, SIBO due to reported improvement in symptoms after antibiotics, microscopic colitis, IBS-D  - Continue creon  - refill sent  - Continue Imodium   - Schedule screening colonoscopy, would recommend biopsies for microscopic colitis  - Consider EUS to further evaluate pancreas if no significant findings on colonoscopy and no improvement after prep  - Follow up post colonoscopy    Colon cancer screening   - The U.S. Multisociety Task Force (MSTF) on Colorectal Cancer suggests CRC screening in average-risk individuals?ages 45-49.?This recommendation is a strong endorsement to the May 2021 U.S. Preventive Services Task Force decision to lower the screening age to 10.  - The task force  also reiterates its recommendations  from 2017:  1. The MSTF strongly? recommends?CRC? screening?in?all individuals aged 50?to 75?who have not already initiated screening.  2. For individuals ages 45 to 3, the decision to start or continue screening should be individualized and based on prior screening history,?comorbidity, life expectancy, CRC risk, and personal preference.  3. Screening is not recommended after age 47.    - Schedule screening colonoscopy    ENDOSCOPIC RISK BENEFIT DISCUSSION: I described the procedure in detail with the patient. I discussed the risks and benefits, including but not limited to: bleeding, perforation, infection, anesthesia complications, and even death. I gave clear directions and instructions to the endoscopy lab. Patient will be NPO after midnight and will have a person physically present at time of pick-up to drive patient home. Patient verbalized understanding and agrees to proceed with procedure as planned. Gave callback number to [facility] in case patient has questions between now and the procedure.         Kylyn Sookram MARIE Sophina Mitten, CNP   11/29/2023

## 2023-12-01 NOTE — Telephone Encounter (Signed)
 Patient has been scheduled at [facility] on 02/15/2024 with Dr. Elroy

## 2023-12-01 NOTE — Telephone Encounter (Signed)
-----   Message from JONELLE Shawl, CNP sent at 12/01/2023  9:53 AM EDT -----  Regarding: Screening colonoscopy  Hello,     The attached patient needs to be scheduled for a screening colonoscopy. It can be done by any provider.    Thanks,    Asberry

## 2023-12-01 NOTE — Patient Instructions (Signed)
 Continue Creon  2 capsules with meals  Schedule screening colonoscopy  Continue imodium   Follow up after procedure

## 2023-12-02 MED ORDER — polyethylene glycol (GOLYTELY) 236-22.74-6.74 -5.86 gram solution
236-22.74-6.74 | ORAL | 0 refills | 14.00000 days | Status: AC
Start: 2023-12-02 — End: ?

## 2023-12-09 NOTE — Telephone Encounter (Signed)
 PT has an appt 8/20. Unable to get imaging done until 9/8, no appts populating after that date. Please advise.

## 2023-12-09 NOTE — Telephone Encounter (Signed)
 Called pt at number listed in demographics. Introduced self to pt. Rescheduled pt telephone visit for after MRI on 9/8. Appt rescheduled for 01/26/24 to allow time of read of MRI to come back. Pt verbalized understanding of telephone visit time.

## 2023-12-09 NOTE — Telephone Encounter (Signed)
 Called and spoke to patient about upcoming telephone visit follow up need MRI prior to appointment patient wasn't aware of MRI gave patient Phone number to schedule and have done before appointment patient verbalize understood.

## 2023-12-10 ENCOUNTER — Ambulatory Visit: Admit: 2023-12-10 | Discharge: 2023-12-14 | Payer: MEDICARE

## 2023-12-10 DIAGNOSIS — G4089 Other seizures: Principal | ICD-10-CM

## 2023-12-10 DIAGNOSIS — R4189 Other symptoms and signs involving cognitive functions and awareness: Principal | ICD-10-CM

## 2023-12-10 NOTE — Progress Notes (Signed)
 [facility]  Neurology Department  Epilepsy Clinic      Subjective     HPI: Joanna Lopez is a 73 y.o. White or Caucasian right handed female with PMH basal cell carcinoma, lung cancer, anxiety, HTN, HLD presenting for further evaluation of episodes she had in 2017. She was in the car with her husband driving when she had hearing loss in the right ear and hearing half gone in the left, lost taste (tasted metallic), smell, touch in fingers of both hands, and the right eye deviated inward. She was hospitalized for stroke workup but stroke workup was negative. These symptoms continued over weeks to months with gradual reduction in intensity. Then she had a minor one after that had disorientation feeling, touch, smell, taste and lasted for 10 days before symptoms went away. She does not recall exactly when it happened as they occurred a long time ago. She also reports seeing four after cancer surgery 2 years ago that went away after a few hours. Denies episodes of disorientation, slow motion or changes in sensation in the past year. She also denies any loss of consciousness, urinary incontinence or tongue biting.     Had surgery for both basal cell and lung cancer. Basal cell was located above the lip. She had 2 lung cancers that were different types and had surgery and chemotherapy (second one) and oral chemotherapy. For the second cancer she had 8-10 infusions twice per week. Last infusion was 2 years ago. Symptoms of what she thought was a stroke occurred before she was diagnosed with lung cancer. She is now in remission. Has had sleep apnea but had surgery.     Risk factors: The patient has no history of febrile seizures, meningitis, encephalitis, head injuries with LOC, or family members with seizures.    Seizure/Spell description: disorientation sensation, hearing loss in the right ear and hearing half gone in the left, lost taste (tasted metallic), smell, touch in  fingers of both hands, and the right eye deviated inward  Date of last seizure: 2017  Frequency of seizures: had 2 episodes   Triggers: none   Current AEDs: none  Current Adverse Effects: na  Past AEDs: none   MRI: 10/27/2008 - unremarkable, pending another MRI   EEG: pending   Onset: 2017  Risk Factors: denies any risk factors but paternal family history unknown   Driving: yes   Handedness: right     3 years of college and then got a 2 year degree in writing code   Lives by herself. She was married but husband passed away.   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 She reports concerns of these being TIAs and requested a referral from her pulmonologist to discuss with a neurology provider. Last episode was in March  of 2025     Today's history (10/26/2023):    2017 - had an episode - she was in car, developed visual changes - 4-5 images instead of one.  Lost hearing on left ear, impaired hearing on right ear.  Metallic taste and smell, cognitively slow, slow talking.  Metalic taste /smell resolved quickly, cognitive slowing / slowed speech was back to normal within a few weeks.  Vision / hearing returned over 4-85months.         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      More recently, her metallic taste has returned, slow motion, not responding to people well - spaced out.  No weakness, no numbness, no dysarthria.  People witnessed and said something's wrong with you.  spells lasted 30-67m of cog impairment.  She was placed back in her car and drove home (less than a block away).  Does not recall headache with these epidoses.  Went to bed and awoke fine the next day.       Has history of headaches.  Had cognitive impairment during these headaches as well.       3-4x over past 6-73mo.    Past Medical History     Past Medical History:   Diagnosis Date    Anxiety     Basal cell cancer     Cataract     Hyperlipidemia     Hypertension     Lung cancer (CMS-HCC)     OSA (obstructive sleep apnea)     RLS (restless legs syndrome)     Stroke (CMS-HCC)     5 yrs ago, TIA X 2     Past Surgical History:   Procedure Laterality Date    APPENDECTOMY      CATARACT EXTRACTION, BILATERAL Bilateral 07/2021    COLONOSCOPY N/A 01/23/2013    Procedure: COLONOSCOPY WITH MAC;  Surgeon: Rankin Dellen, MD;  Location: [facility] ENDOSCOPY;  Service: Gastroenterology;  Laterality: N/A;    FLEXIBLE BRONCHOSCOPY W/ UPPER ENDOSCOPY N/A 09/17/2016    Procedure: BRONCHOSCOPY SUPER D WITH FLUORO AND BIOPSY-EBUS/NAVI;  Surgeon: Sadia Benzaquen, MD;  Location: [facility] ENDOSCOPY;  Service: Pulmonary;  Laterality: N/A;    INSERTION CATHETER VASCULAR ACCESS N/A 12/26/2021    Procedure: INSERTION POWER PORT A CATH;  Surgeon: Lucendia Blanch, MD;  Location: [facility];  Service: General;  Laterality: N/A;    LOOP IMPLANT IMPLANTATION N/A 04/07/2017    Procedure: Loop  Implantation;  Surgeon: Lue Legions, MD;  Location: [facility] ELECTROPHYSIOLOGY LAB;  Service: Electrophysiology;  Laterality: N/A;    LOOP REMOVAL Left 04/09/2021    Procedure: Loop Removal;  Surgeon: Rory Osier, MD;  Location:  [facility];  Service: Electrophysiology;  Laterality: Left;    submastoid gland removal      THORACOSCOPY Right 11/23/2016    Procedure: RIGHT SIDED VATS MIDDLE LOBE LOBECTOMY,HILAR MEDIASTINAL LYMPH NODE DISSECTION;  Surgeon: Nena Humbles, MD;  Location: [facility] OR;  Service: Thoracic;  Laterality: Right;    THORACOSCOPY LOBECTOMY VIDEO ASSISTED Left 11/18/2021    Procedure: Left Thoracoscopic Lingulectomy;  Surgeon: Nena Humbles, MD;  Location: [facility];  Service: General;  Laterality: Left;    TONSILLECTOMY      UVULOPALATOPHARYNGOPLASTY         Family History  Family History   Adopted: Yes   Problem Relation Age of Onset    Diabetes Son        Social History     Social History[1]    Allergy     Allergies[2]    ROS     10-point review of systems completed  Constitutional: no significant weight loss or fever   Skin: no rashes   Head: no headaches or dizziness   Eyes: no vision loss or pain   ENT: no hearing loss, nasal discharge or congestion   Respiratory: no shortness of breath or cough   Cardiac: no chest pain or palpitations    GI: no abdominal pain or change in bowel habits   Musculoskeletal: No joint pain or muscle aches   Neurological: No new numbness, tingling, or weakness   Psychiatric: denies anxiety or depression     Medications     Home Medications   Medication Sig Taking? Last Dose   aspirin  325 MG tablet Take 1 tablet (325 mg total) by mouth daily.     atorvastatin  (LIPITOR ) 80 MG tablet Take 1 tablet (80 mg total) by mouth every morning.     BIOTIN ORAL Take 1 tablet by mouth daily.     busPIRone  (BUSPAR ) 10 MG tablet Take 1 tablet (10 mg total) by mouth in the morning and at bedtime.     calcium -vitamin D  (OSCAL-500 + D) 500 mg-5 mcg (200 unit) per tablet Take 1 tablet by mouth daily.  Patient taking differently: Take 1 tablet by mouth daily. As needed     gabapentin  (NEURONTIN ) 300 MG capsule Take 1 capsule (300 mg total) by mouth 3 times a day.     leucovorin-pyridox-mecobalamin (FOLINIC-PLUS)  4-50-2 mg Tab Take 1 tablet by mouth daily.     lipase -protease -amylase  (CREON ) 24,000-76,000 -120,000 unit CpDR Take 2 capsules by mouth 3 times a day as needed (for abdominal cramping). Take prior to meals     lisinopriL  (PRINIVIL ) 10 MG tablet Take 1 tablet (10 mg total) by mouth daily.     loperamide  (IMODIUM ) 2 mg capsule Take 1-2 capsules (2-4 mg total) by mouth 4 times a day as needed for Diarrhea. Take 4 mg (2 capsules) after the first loose stool and then 1 capsule for each loose stool thereafter     MELATONIN ORAL Take 10 mg by mouth at bedtime as needed (sleep).     multivitamin (THERAGRAN) tablet Take 1 tablet by mouth daily.     ondansetron  (ZOFRAN ) 8 MG tablet Take 1 tablet (8 mg total) by mouth every 8 hours as needed for Nausea.     PARoxetine  (PAXIL ) 20 MG tablet Take 1 tablet (20 mg total) by mouth at bedtime.     polyethylene glycol (GOLYTELY ) 236-22.74-6.74 -5.86 gram solution Please take your prep according to the instructions outlined in your prep letter in messages and communications.     roPINIRole  (REQUIP ) 1 MG tablet Take 1 tablet (1 mg total) by mouth at bedtime. Indications: Restless Legs Syndrome         Physical Exam:     There were no vitals taken for this visit.  General: Alert and oriented to self, location and situation, NAD, cooperative  HEENT: NC/AT, EOMI, PERRL, MMM, no carotid bruits, neck supple, no meningeal signs  Fundoscopic: no disc edema appreciated  Heart: RRR, S1/S2  Lungs: symmetric motion of chest wall, no signs of respiratory distress  Abdomen: soft/NT/ND  Ext: no edema  Psych: affect and mood  appropriate  Skin: no rashes or lesions    Neurologic Exam:  Mental Status: Alert and oriented to self, location and situation. Good fund of knowledge, appropriate reasoning, able to think abstractly.     Language: Speech clear, language fluent, repetition and naming intact, follows commands appropriately.     Cranial Nerves II-XII: PERRL, VFF, no nystagmus, no gaze paresis,  sensation V1-V3 intact b/l, muscles of facial expression symmetric, hearing intact to conversational tone, palate elevates symmetrically, shoulder elevation symmetric, tongue protrudes midline with movement side to side.     Motor exam: Strength 5/5 UE/LE's b/l, tone and bulk normal, no pronator drift  Strength (R/L motor scale)  UE:   Shoulder abductors 5/5   Arm flexors 5/5 Arm extensors 5/5   Wrist extensors 5/5 Wrist extensors 5/5   Finger flexors: 5/5 Finger extensors: 5/5  Interosseous 5/5    LE:   Hip flexors 5/5  Leg flexors 5/5 Leg extensors 5/5  Dorsoflexors 5/5 Plantarflexors 5/5     Sensory: intact diffusely to temp, light touch and vibratory touch; proprioception intact    Deep Tendon Reflexes: 2+ b/l in biceps, brachioradialis, triceps, patella, and achilles; toes   downgoing b/l; neg. Hoffman's, no clonus   (R/L)  Biceps 2/2  Brachioradialis 2/2  Triceps 2/2  Quadriceps 2/2  Achilles 2/2  Toes: down/down   Hoffman: neg/neg  Clonus: none/none    Coordination/Cerebellum: No dysmetria, dysdiadochokinesia, or ataxia noted; no tremor    Gait: normal gait and base w/ standard walk and tandem walk        LABS     Lab Results   Component Value Date    GLUCOSE 83 11/29/2023    BUN 27 (H) 11/29/2023    CO2 23 11/29/2023    CREATININE 1.18 11/29/2023    K 4.4 11/29/2023    NA 140 11/29/2023    CL 107 11/29/2023    CALCIUM  9.6 11/29/2023     No results found for: GLUFAST  Lab Results   Component Value Date    WBC 10.1 11/29/2023    HGB 13.2 11/29/2023    HCT 39.3 11/29/2023    MCV 93.9 11/29/2023    PLT 176 11/29/2023     Lab Results   Component Value Date    INR 0.9 09/08/2023     Lab Results   Component Value Date    CHOLTOT 174 02/22/2017    TRIG 249 (H) 02/22/2017    HDL 41 (L) 02/22/2017    LDL 83 02/22/2017     Lab Results   Component Value Date    HGBA1C 5.5 02/19/2017     Lab Results   Component Value Date    GLUCCSF 56 02/22/2017       Scans:  No results found.      ASSESSMENT/PLAN :     ELVENIA GODDEN is a 73 y.o. White or Caucasian right handed female with PMH basal cell carcinoma, lung cancer, anxiety, HTN, HLD presenting for further evaluation of episodes she had in 2017. She denies having any recent episodes in the past year and reports only 2 episodes around 2017. Will hold on medication for now and follow-up pending seizure workup.     Plan:  - follow-up MRI brain w wo   - follow-up routine EEG   - withhold ASM's for now     Patient was seen and discussed with the attending physician, Dr. Bonnell, who is in agreement with the above assessment and plan.  RTC 4 months     LUCIE EVERITT BIBLES, MD  Epilepsy Fellow, PGY-6  8:08 AM  12/10/2023            [1]   Social History  Tobacco Use    Smoking status: Never    Smokeless tobacco: Never   Vaping Use    Vaping status: Never Used   Substance Use Topics    Alcohol use: Not Currently     Comment: occasionally    Drug use: No   [2]   Allergies  Allergen Reactions    Penicillins Anaphylaxis     Facial Swelling  immediate rash, facial/tongue/throat swelling, SOB or lightheadedness with hypotension,   severe rash involving mucus membranes or skin necrosis    Adhesive      Other reaction(s): breaks out and itches if on for long time    Ciprofloxacin  Other (See Comments)     Tendonitis.     Formaldehyde Analogues     Nsaids (Non-Steroidal Anti-Inflammatory Drug)      GI issues    Promethazine  Hcl      Restless legs    Latex Rash    Sulfa (Sulfonamide Antibiotics) Rash

## 2023-12-22 ENCOUNTER — Other Ambulatory Visit: Admit: 2023-12-22 | Payer: MEDICARE

## 2023-12-22 ENCOUNTER — Ambulatory Visit: Admit: 2023-12-22 | Discharge: 2023-12-26 | Payer: MEDICARE | Attending: Family

## 2023-12-22 DIAGNOSIS — K7689 Other specified diseases of liver: Principal | ICD-10-CM

## 2023-12-22 LAB — HEPATIC FUNCTION PANEL, SERUM
ALT: 24 U/L (ref 7–52)
AST (SGOT): 24 U/L (ref 13–39)
Albumin: 4.2 g/dL (ref 3.5–5.7)
Alkaline Phosphatase: 77 U/L (ref 36–125)
Bilirubin, Direct: 0.1 mg/dL (ref 0.00–0.40)
Bilirubin, Indirect: 0.4 mg/dL (ref 0.00–1.10)
Total Bilirubin: 0.5 mg/dL (ref 0.0–1.5)
Total Protein: 6.9 g/dL (ref 6.4–8.9)

## 2023-12-22 NOTE — Patient Instructions (Signed)
 Hepatic panel with your next labs  Follow up in one year.

## 2023-12-22 NOTE — Progress Notes (Signed)
 Chief Complaint   Patient presents with    Liver Mass/lesion        History of Present Illness:  Joanna Lopez is a 73 y.o. female with multilobulated or cystic lesions of the liver on imaging who returns for follow-up visit.  Last seen May 2025.    PMH lung cancer (s/p VATS, chemo) 2023, HTN, CKD stage III, pancreatic insufficiency, stroke,     Patient was  admitted to [facility] mid April 2025 for complaints of new and worsening left upper quadrant pain over the prior 6 weeks.  With leukocytosis, AKI.  At that time she had a CT abdomen that showed mild inflammatory changes adjacent to multilobulated septated left hepatic cystic lesion that were nonspecific and possibly representative of infection or inflammation versus recent rupture.  She was treated with antibiotics (Rocephin , Flagyl ) and discharged on Cipro /Flagyl  for 5 days.   Interventional radiology recommended outpatient follow-up and possible drainage if indicated.  Repeat CT abdomen showed  showed a possible evolving abscess.  She was seen by infectious disease (Dr. Wendee) and antibiotics were restarted (cefdinir  and Flagyl ), she had severe pain in her heels with Cipro , with plans to continue until radiographic and clinical evidence of resolution. She saw Dr. Leni Meter (surgery) who recommended observation.  The abdominal pain has resolved. She continues to have episodes of diarrhea and is scheduled for a colonoscopy early September and A CT abdomen coming up in October for her cancer surveillance. She noticed a while back light stools and dark urine that resolved after taking Creon . Liver tests were normal in May 2025. She is undergoing routine cancer surveillance CT abdomen and Pelvis scheduled for October.  Denies fever, edema, hematochezia, melena, hematemesis.     She was worried about decline in GFR and saw Dr. Gaylord (nephrology). For pancreatic insufficiency and diarrhea, she saw Asberry Shawl CNP and is scheduled for a  colonoscopy in October. Seeing neurology Dr. Bonnell, Dr. Earl for past history of seizures.     Testing:   -CT abdomen and pelvis 08/25/2023: Multiple hepatic cystic lesions including multilobulated lesion in the left hepatic lobe measuring 5 x 7.4 cm.  This lesion is slightly increased from 3.7 x 6.7 cm on 12/29/2016.  There are suspected internal septations without discrete enhancing nodule identified.  There is adjacent fat stranding to this lesion, new from prior.  Additional scattered subcentimeter low-density lesions in the liver too small to characterize but are favored to represent small cysts or hemangiomas.  Nonspecific and may represent infection or inflammation of the cystic lesion versus recent rupture.  -CT abdomen and pelvis 09/13/2023: Smaller size of multiseptated cystic lesion in segment 3 of the liver with decrease of the adjacent inflammatory change.  There is interval development of a new thick-walled cystic lesion medially in segment 3 which may represent an abscess.     Review of Systems   The following portions of the patient history were reviewed and updated as appropriate: allergies, current medications, family, medical, surgical and social history and problem list.    A comprehensive Review of Systems was completed and was negative other than what has been noted in the HPI.    Past Medical History:  She has a past medical history of Anxiety, Basal cell cancer, Cataract, Hyperlipidemia, Hypertension, Lung cancer (CMS-HCC), OSA (obstructive sleep apnea), RLS (restless legs syndrome), and Stroke (CMS-HCC).    Medications:  [Current Medications]    [Current Medications]    Current Outpatient Medications:  aspirin  325 MG tablet, Take 1 tablet (325 mg total) by mouth daily., Disp: 30 tablet, Rfl: 0    atorvastatin  (LIPITOR ) 80 MG tablet, Take 1 tablet (80 mg total) by mouth every morning., Disp: , Rfl:     BIOTIN ORAL, Take 1 tablet by mouth daily., Disp: , Rfl:     busPIRone  (BUSPAR ) 10 MG  tablet, Take 1 tablet (10 mg total) by mouth in the morning and at bedtime., Disp: , Rfl:     calcium -vitamin D  (OSCAL-500 + D) 500 mg-5 mcg (200 unit) per tablet, Take 1 tablet by mouth daily., Disp: , Rfl:     gabapentin  (NEURONTIN ) 300 MG capsule, Take 1 capsule (300 mg total) by mouth 3 times a day., Disp: 90 capsule, Rfl: 1    leucovorin-pyridox-mecobalamin (FOLINIC-PLUS) 4-50-2 mg Tab, Take 1 tablet by mouth daily., Disp: , Rfl:     lipase /protease /amylase  (CREON  ORAL), Take 1 capsule by mouth 3 times a day as needed (for abdominal cramping). Take prior to meals, Disp: , Rfl:     lisinopriL  (PRINIVIL ) 10 MG tablet, Take 1 tablet (10 mg total) by mouth daily., Disp: , Rfl:     loperamide  (IMODIUM ) 2 mg capsule, Take 1-2 capsules (2-4 mg total) by mouth 4 times a day as needed for Diarrhea. Take 4 mg (2 capsules) after the first loose stool and then 1 capsule for each loose stool thereafter, Disp: , Rfl:     MELATONIN ORAL, Take 10 mg by mouth at bedtime as needed (sleep)., Disp: , Rfl:     multivitamin (THERAGRAN) tablet, Take 1 tablet by mouth daily., Disp: , Rfl:     ondansetron  (ZOFRAN ) 8 MG tablet, Take 1 tablet (8 mg total) by mouth every 8 hours as needed for Nausea., Disp: 30 tablet, Rfl: 3    PARoxetine  (PAXIL ) 20 MG tablet, Take 1 tablet (20 mg total) by mouth at bedtime., Disp: , Rfl:     polyethylene glycol (MIRALAX ) 17 gram/dose powder, Take 17 g by mouth daily as needed (constipation)., Disp: , Rfl:     roPINIRole  (REQUIP ) 1 MG tablet, Take 1 tablet (1 mg total) by mouth at bedtime. Indications: Restless Legs Syndrome, Disp: , Rfl:       Allergies:  Penicillins, Adhesive, Ciprofloxacin , Formaldehyde analogues, Nsaids (non-steroidal anti-inflammatory drug), Promethazine  hcl, Latex, and Sulfa (sulfonamide antibiotics)    Family History:  Her family history includes Diabetes in her son. She was adopted.    Past Surgical History:  She has a past surgical history that includes Appendectomy; submastoid  gland removal; Uvulopalatopharyngoplasty; Tonsillectomy; Colonoscopy (N/A, 01/23/2013); Flexible bronchoscopy w/ upper endoscopy (N/A, 09/17/2016); Thoracoscopy (Right, 11/23/2016); Loop Implant  Implantation (N/A, 04/07/2017); Loop Removal (Left, 04/09/2021); Cataract extraction, bilateral (Bilateral, 07/2021); thoracoscopy lobectomy video assisted (Left, 11/18/2021); and insertion catheter vascular access (N/A, 12/26/2021).    Social History:  She reports that she has never smoked. She has never used smokeless tobacco. She reports that she does not currently use alcohol. She reports that she does not use drugs.    The following portions of the patient's history were reviewed and updated as appropriate: allergies, current medications, past family history, past medical history, past social history, past surgical history, and problem list.    Review of Systems:  ROS    Vital Signs:  Blood pressure 120/82, pulse 90, height 5' 7 (1.702 m), weight 185 lb (83.9 kg), SpO2 97%.  Physical Exam  Vitals reviewed.   Constitutional:       General: She  is not in acute distress.     Appearance: Normal appearance. She is well-developed. She is obese. She is not diaphoretic.   HENT:      Head: Normocephalic and atraumatic.   Eyes:      General: No scleral icterus.        Right eye: No discharge.         Left eye: No discharge.      Conjunctiva/sclera: Conjunctivae normal.   Neck:      Trachea: No tracheal deviation.   Cardiovascular:      Rate and Rhythm: Normal rate and regular rhythm.      Heart sounds: Normal heart sounds. No murmur heard.     No gallop.   Pulmonary:      Effort: Pulmonary effort is normal. No respiratory distress.      Breath sounds: Normal breath sounds. No stridor. No wheezing, rhonchi or rales.   Abdominal:      General: Abdomen is flat. Bowel sounds are normal. There is no distension.      Palpations: Abdomen is soft. There is no mass.      Tenderness: There is no abdominal tenderness. There is no guarding  or rebound.      Hernia: No hernia is present.      Comments: No dullness to flanks. Liver edge palpable on inspiration. Mild tenderness LUQ with deep palpation.   Musculoskeletal:      Right lower leg: No edema.      Left lower leg: No edema.   Lymphadenopathy:      Cervical: No cervical adenopathy.   Skin:     General: Skin is warm and dry.      Comments: No palmar erythema, jaundice or spider angiomata.    Neurological:      Mental Status: She is alert and oriented to person, place, and time.      Comments: No asterixis or tremors.    Psychiatric:         Behavior: Behavior normal.         Thought Content: Thought content normal.         Judgment: Judgment normal.       Labs and imaging reviewed    Review of Lab Results:  Lab Results   Component Value Date    WBC 10.1 11/29/2023    HGB 13.2 11/29/2023    HGB 12.1 11/18/2021    HCT 39.3 11/29/2023    HCT 40.5 11/23/2016    MCV 93.9 11/29/2023    PLT 176 11/29/2023    CREATININE 1.18 11/29/2023    BUN 27 (H) 11/29/2023    NA 140 11/29/2023    K 4.4 11/29/2023    CL 107 11/29/2023    CO2 23 11/29/2023    ALT 18 09/22/2023    AST 22 09/17/2023    ALKPHOS 69 09/22/2023    BILITOT 0.5 09/22/2023       Prior Diagnostic Testing:   CT: Abdomen and pelvis 08/25/2023  ABDOMEN AND PELVIS   Mild inflammatory changes adjacent to a multilobulated septated left hepatic cystic lesion; nonspecific and may represent infection or inflammation of the cystic lesion versus recent rupture.     CT abdomen and pelvis 09/13/2023  Impression   IMPRESSION:  Smaller size of the multiseptated cystic lesion in segment 3 of the liver with decrease of the adjacent inflammatory change. There is interval development of a new thick-walled cystic lesion medially in segment 3 which may represent abscess.  Assessment & Plan:  Joanna Lopez is a 73 y.o. female with multilobulated or cystic lesions of the liver on imaging who returns for follow-up visit.  Last seen May 2025.    Liver cysts:  normal liver tests. Multiple cystic lesions in the liver. Will follow CT ordered by oncology.   Cystic lesions/abscess:   Pain resolved, CBC normalized. Off antibiotics. Follow up with Dr. Wendee as scheduled. Check liver enzymes.  Diarrhea: Scheduled for colonoscopy. Follow up with GI as scheduled.  Labs as ordered.  Follow up one year or sooner if problems.       Medical Decision Making:  The following items were considered in medical decision making:  Permanent chart problem/surgery list reviewed  Permanent chart chronic medication/allergy list reviewed  Permanent chart social/family history reviewed  Review/order clinical lab tests  Review/order radiology tests  Review/order other diagnostic or treatment interventions  Rev iew old records    This note was completely edited, written and reviewed by me and consists of information cut and pasted from the my most recent visit, my smart phrases and other Epic tools. I have personally reviewed all aspects of this note to at least include reviewing this patient's chart and problem list, updating the history, physical exam, lab and procedure results, and assessment and plan as detailed above and below.  As such this visit note reflects my current evaluation and management for this patient.

## 2023-12-29 ENCOUNTER — Ambulatory Visit: Payer: MEDICARE | Attending: Neurology

## 2024-01-17 DIAGNOSIS — R4189 Other symptoms and signs involving cognitive functions and awareness: Principal | ICD-10-CM

## 2024-01-17 MED ORDER — gadobutrol (GADAVIST) 1 mmol/1 mL IV solution 8 mL
1 | Freq: Once | INTRAVENOUS | Status: AC | PRN
Start: 2024-01-17 — End: 2024-01-17
  Administered 2024-01-18: 03:00:00 via INTRAVENOUS

## 2024-01-17 NOTE — Telephone Encounter (Signed)
 Patient called back in stating she has made other arrangements and wants to keep 10/07 appointment, IGNORE previous message

## 2024-01-17 NOTE — Telephone Encounter (Signed)
 Patient called into colo line and left a a voicemail to reschedule her 10/07 colonoscopy with Dr. Elroy at [facility]

## 2024-01-18 ENCOUNTER — Inpatient Hospital Stay: Admit: 2024-01-18 | Discharge: 2024-01-23 | Payer: MEDICARE | Attending: Neurology

## 2024-01-19 DIAGNOSIS — R4189 Other symptoms and signs involving cognitive functions and awareness: Secondary | ICD-10-CM

## 2024-01-26 ENCOUNTER — Ambulatory Visit: Admit: 2024-01-26 | Discharge: 2024-02-02 | Payer: MEDICARE | Attending: Neurology

## 2024-01-26 DIAGNOSIS — R4189 Other symptoms and signs involving cognitive functions and awareness: Principal | ICD-10-CM

## 2024-01-26 NOTE — Progress Notes (Signed)
 Neurovascular / Neurointerventional Surgery    CC: No chief complaint on file.      Last Visit's Assessment and Plan:    In summary, Joanna Lopez is a 73 y.o. female with a history of well controlled hypertension, hyperlipidemia, who initially presented with right CN 4 palsy and episodes of metallic taste; her lung mass was diagnosed as cancer and she has undergone treatment.  The CN 4 palsy resolved with time (?and cancer treatment), but she returns with new episodes of metallic taste + cognitive slowing. We would recommend / have instituted the following plans.      # recurrent spells of metallic taste and cognitive impairment.   - doubt TIA given semiology atypical for vascular distribution.   - given history of lung cancer and similar spells in the past, would favor seizure (migraine variant less likely).    - MRI brain with and without   - EEG  - referral to epilepsy    Today's history (01/26/2024):   No new episodes of metalic taste since our visit in June, but pt clarifies that her most recent epiosdes of metallic taste and transient cognitive impairment occurred on Feb/March 2025, with 2-3 episodes within a 2wk period.      (It appears there was a miscommunication about the recency of these epidoes during epilepsy clinic visit).      She has not had EEG because didn't realize this had been ordered.  Also does not have follow-up scheduled w Epilepsy .    MRI neg for dangerous causes of spells.     History of Present Illness        PMHx:   Past Medical History:   Diagnosis Date    Anxiety     Basal cell cancer     Cataract     Hyperlipidemia     Hypertension     Lung cancer (CMS-HCC)     OSA (obstructive sleep apnea)     RLS (restless legs syndrome)     Stroke (CMS-HCC)     5 yrs ago, TIA X 2     Problem List[1]    PSHx:   Past Surgical History:   Procedure Laterality Date    APPENDECTOMY      CATARACT EXTRACTION, BILATERAL Bilateral 07/2021    COLONOSCOPY N/A 01/23/2013    Procedure: COLONOSCOPY WITH  MAC;  Surgeon: Rankin Dellen, MD;  Location: [facility] ENDOSCOPY;  Service: Gastroenterology;  Laterality: N/A;    FLEXIBLE BRONCHOSCOPY W/ UPPER ENDOSCOPY N/A 09/17/2016    Procedure: BRONCHOSCOPY SUPER D WITH FLUORO AND BIOPSY-EBUS/NAVI;  Surgeon: Sadia Benzaquen, MD;  Location: [facility] ENDOSCOPY;  Service: Pulmonary;  Laterality: N/A;    INSERTION CATHETER VASCULAR ACCESS N/A 12/26/2021    Procedure: INSERTION POWER PORT A CATH;  Surgeon: Lucendia Blanch, MD;  Location: [facility] OR;  Service: General;  Laterality: N/A;    LOOP IMPLANT IMPLANTATION N/A 04/07/2017    Procedure: Loop  Implantation;  Surgeon: Lue Legions, MD;  Location: [facility] ELECTROPHYSIOLOGY LAB;  Service: Electrophysiology;  Laterality: N/A;    LOOP REMOVAL Left 04/09/2021    Procedure: Loop Removal;  Surgeon: Rory Osier, MD;  Location: [facility] MIS SUITE;  Service: Electrophysiology;  Laterality: Left;    submastoid gland removal      THORACOSCOPY Right 11/23/2016    Procedure: RIGHT SIDED VATS MIDDLE LOBE LOBECTOMY,HILAR MEDIASTINAL LYMPH NODE DISSECTION;  Surgeon: Nena Humbles, MD;  Location: [facility] OR;  Service: Thoracic;  Laterality: Right;    THORACOSCOPY LOBECTOMY VIDEO ASSISTED  Left 11/18/2021    Procedure: Left Thoracoscopic Lingulectomy;  Surgeon: Nena Humbles, MD;  Location: [facility] OR;  Service: General;  Laterality: Left;    TONSILLECTOMY      UVULOPALATOPHARYNGOPLASTY          Meds: Encounter Medications[2]  Allergies: Penicillins, Adhesive, Ciprofloxacin , Formaldehyde analogues, Nsaids (non-steroidal anti-inflammatory drug), Promethazine  hcl, Latex, and Sulfa (sulfonamide antibiotics)  Social Hx: Social History[3]  FHx: family history includes Diabetes in her son. She was adopted.    PHYSICAL EXAMINATION:     There were no vitals filed for this visit.       Exam by phone:   Pt is a good historian with normal language  No dysarthria.          Data/Diagnostic Studies (I personally reviewed all neuroimaging):     MRI 01/19/24  1.  No evidence of  intracranial metastatic disease.   2.  Mild small vessel disease and diffuse atrophy, stable.   3.  Remote lacunar infarct in right cerebellum and remote microhemorrhage in left cerebellum, stable.     Results        Impression / Summary:   In summary, Joanna Lopez is a 73 y.o. female with a history of well controlled hypertension, hyperlipidemia, who initially presented with right CN 4 palsy and episodes of metallic taste; her lung mass was diagnosed as cancer and she has undergone treatment.  The CN 4 palsy resolved with time (?and cancer treatment), but at our last visit she reported several new episodes metallic taste + cognitive slowing. MRI was negative for dangerous causes of these spells. Epilepsy clinic recommended no anti-seizure meds, but this was based on the understanding that her most recent spells were in 2017.   EEG has not yet been performed.    We would recommend / have instituted the following plans.      # recurrent spells of metallic taste and cognitive impairment.   - doubt TIA given semiology atypical for vascular distribution.   - MRI brain with and without neg for dangerous causes  - reminded her to obtain EEG  - advised to contact epilepsy clinic to clarify recent timing of spells (though none since 06/2023-07/2023) and to make 84mo follow-up appt with them (not yet on calendar).     Followup:     - No further follow up with the [facility] is necessary.  We would be happy to see the patient if new or worsening symptoms arise.      This was a Phone conversation, in lieu of an in-person visit. The patient provided verbal consent to participate in the telehealth visit.     I spent 25 minutes speaking with the patient, conducting an interview, performing a limited exam, and educating the patient on my assessment and plan as well as risk factors for stroke / hemorrhage and the need to call 911. I also spent 12 minutes, on the same day as the encounter, preparing to see the patient  (eg, review of tests), counseling and educating the family/caregiver , ordering medications, tests, or procedures, referring and communicating with other health care professionals  and documenting clinical information in the electronic or other health record.         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 30 tablet 0    atorvastatin  (LIPITOR ) 80 MG tablet Take 1 tablet (80 mg total) by mouth every morning.      BIOTIN ORAL Take 1 tablet by mouth daily.      busPIRone  (BUSPAR ) 10 MG tablet Take 1 tablet (10 mg total) by mouth in the morning and at bedtime.      calcium -vitamin D  (OSCAL-500 + D) 500 mg-5 mcg (200 unit) per tablet Take 1 tablet by mouth daily. (Patient taking differently: Take 1 tablet by mouth daily. As needed)      gabapentin  (NEURONTIN ) 300 MG capsule Take 1 capsule (300 mg total) by mouth 3 times a day. 90 capsule 1    leucovorin-pyridox-mecobalamin (FOLINIC-PLUS) 4-50-2 mg Tab Take 1 tablet by mouth daily.      lipase -protease -amylase  (CREON ) 24,000-76,000 -120,000 unit CpDR Take 2 capsules by mouth 3 times a day as needed (for abdominal cramping). Take prior to meals 60 capsule 3    lisinopriL  (PRINIVIL ) 10 MG tablet Take 1 tablet (10 mg total) by mouth daily.      loperamide  (IMODIUM ) 2 mg capsule Take 1-2 capsules (2-4 mg total) by mouth 4 times a day  as needed for Diarrhea. Take 4 mg (2 capsules) after the first loose stool and then 1 capsule for each loose stool thereafter      MELATONIN ORAL Take 10 mg by mouth at bedtime as needed (sleep).      multivitamin (THERAGRAN) tablet Take 1 tablet by mouth daily.      ondansetron  (ZOFRAN ) 8 MG tablet Take 1 tablet (8 mg total) by mouth every 8 hours as needed for Nausea. 30 tablet 3    PARoxetine  (PAXIL ) 20 MG tablet Take 1 tablet (20 mg total) by mouth at bedtime.      polyethylene glycol (GOLYTELY ) 236-22.74-6.74 -5.86 gram solution Please take your prep according to the instructions outlined in your prep letter in messages and communications. 4000 mL 0    roPINIRole  (REQUIP ) 1 MG tablet Take 1 tablet (1 mg total) by mouth at bedtime. Indications: Restless Legs Syndrome       No facility-administered encounter medications on file as of 01/26/2024.   [3]   Social History  Tobacco Use    Smoking status: Never    Smokeless tobacco: Never   Vaping Use    Vaping status: Never Used   Substance Use Topics    Alcohol use: Not Currently     Comment: occasionally    Drug use: No

## 2024-02-03 ENCOUNTER — Inpatient Hospital Stay: Admit: 2024-02-03 | Payer: MEDICARE | Attending: Neurology

## 2024-02-03 DIAGNOSIS — G4089 Other seizures: Principal | ICD-10-CM

## 2024-02-03 DIAGNOSIS — R4189 Other symptoms and signs involving cognitive functions and awareness: Secondary | ICD-10-CM

## 2024-02-03 NOTE — Procedures (Signed)
 Identifying Information:  Name: Joanna Lopez  MRN: [id number]  DOB: [birth year]  Interpreting Physician: Damien JONELLE Ables, MD  Reporting Physician: Damien FABIENE Ables, MD  Referring Physician: Beverley LELON Friedlander, MD  Date of EEG: 02/03/24  Procedure Location: outpatient    Clinical History:  Joanna Lopez is a 73 y.o. female with a reported history of HTN, HLD, lung cancer who was referred for possible seizures.    Current Medications:     Current Outpatient Medications   Medication Sig Dispense Refill    aspirin  325 MG tablet Take 1 tablet (325 mg total) by mouth daily. 30 tablet 0    atorvastatin  (LIPITOR ) 80 MG tablet Take 1 tablet (80 mg total) by mouth every morning.      BIOTIN ORAL Take 1 tablet by mouth daily.      busPIRone  (BUSPAR ) 10 MG tablet Take 1 tablet (10 mg total) by mouth in the morning and at bedtime.      calcium -vitamin D  (OSCAL-500 + D) 500 mg-5 mcg (200 unit) per tablet Take 1 tablet by mouth daily. (Patient taking differently: Take 1 tablet by mouth daily. As needed)      gabapentin  (NEURONTIN ) 300 MG capsule Take 1 capsule (300 mg total) by mouth 3 times a day. 90 capsule 1    leucovorin-pyridox-mecobalamin (FOLINIC-PLUS) 4-50-2 mg Tab Take 1 tablet by mouth daily.      lipase -protease -amylase  (CREON ) 24,000-76,000 -120,000 unit CpDR Take 2 capsules by mouth 3 times a day as needed (for abdominal cramping). Take prior to meals 60 capsule 3    lisinopriL  (PRINIVIL ) 10 MG tablet Take 1 tablet (10 mg total) by mouth daily.      loperamide  (IMODIUM ) 2 mg capsule Take 1-2 capsules (2-4 mg total) by mouth 4 times a day as needed for Diarrhea. Take 4 mg (2 capsules) after the first loose stool and then 1 capsule for each loose stool thereafter      MELATONIN ORAL Take 10 mg by mouth at bedtime as needed (sleep).      multivitamin (THERAGRAN) tablet Take 1 tablet by mouth daily.      ondansetron  (ZOFRAN ) 8 MG tablet Take 1 tablet (8 mg total) by mouth every 8 hours as needed for Nausea. 30  tablet 3    PARoxetine  (PAXIL ) 20 MG tablet Take 1 tablet (20 mg total) by mouth at bedtime.      polyethylene glycol (GOLYTELY ) 236-22.74-6.74 -5.86 gram solution Please take your prep according to the instructions outlined in your prep letter in messages and communications. 4000 mL 0    roPINIRole  (REQUIP ) 1 MG tablet Take 1 tablet (1 mg total) by mouth at bedtime. Indications: Restless Legs Syndrome       No current facility-administered medications for this encounter.         Indication:  Other seizures    Technical Summary:  26 channels of EEG were recorded in a digital format on a patient who is reported to be awake and drowsy during the recording. The patient was not sleep deprived prior to the EEG.    The background rhythm consisted of well-developed, well-regulated 10 Hz alpha activity, maximal over the posterior head regions and reactive to eye opening and closure.       The record was remarkable for the presence of:  Occasional left temporal slowing. Focal slowing in the form of intermittent polymorphic delta activity over the left temporal head region.     Photic stimulation was performed and produced  no abnormalities. During the recording stage II sleep  was not seen.     The EKG lead revealed no rhythm abnormalties.    EEG Interpretation:   The EEG was abnormal due to the presence of:    Occasional left temporal slowing. Focal slowing is consistent with a focal disturbance of cerebral function and an underlying structural lesion should be considered.      Clinical correlation is recommended.    Damien Ables, MD  Neurology

## 2024-02-10 ENCOUNTER — Other Ambulatory Visit: Admit: 2024-02-10 | Payer: MEDICARE

## 2024-02-10 ENCOUNTER — Ambulatory Visit: Admit: 2024-02-10 | Discharge: 2024-02-15 | Payer: MEDICARE

## 2024-02-10 DIAGNOSIS — G629 Polyneuropathy, unspecified: Principal | ICD-10-CM

## 2024-02-10 LAB — HEMOGLOBIN A1C: Hemoglobin A1C: 5.7 % — ABNORMAL HIGH (ref 4.0–5.6)

## 2024-02-10 LAB — VITAMIN B12: Vitamin B-12: >1500 pg/mL — ABNORMAL HIGH (ref 180–914)

## 2024-02-10 LAB — VITAMIN B6: Vitamin B6: 96.8 ug/L — ABNORMAL HIGH (ref 3.4–65.2)

## 2024-02-10 LAB — TSH: TSH: 2.12 u[IU]/mL (ref 0.45–4.12)

## 2024-02-10 MED ORDER — levETIRAcetamERKEPPRAXR500mg24hrtablet
500 | ORAL_TABLET | Freq: Every day | ORAL | 1 refills | 30.00000 days | Status: AC
Start: 2024-02-10 — End: 2024-08-08

## 2024-02-10 NOTE — Progress Notes (Cosign Needed)
 [facility]  Neurology Department  Epilepsy Clinic      Subjective     HPI: Joanna Lopez is a 73 y.o. White or Caucasian right handed female with PMH basal cell carcinoma, lung cancer, anxiety, HTN, HLD presenting for further evaluation of episodes she had in 2017. She was in the car with her husband driving when she had hearing loss in the right ear and hearing half gone in the left, lost taste (tasted metallic), smell, touch in fingers of both hands, and the right eye deviated inward. She was hospitalized for stroke workup but stroke workup was negative. These symptoms continued over weeks to months with gradual reduction in intensity. Then she had a minor one after that had disorientation feeling, touch, smell, taste and lasted for 10 days before symptoms went away. She does not recall exactly when it happened as they occurred a long time ago. She also reports seeing four after cancer surgery 2 years ago that went away after a few hours. Denies episodes of disorientation, slow motion or changes in sensation in the past year. She also denies any loss of consciousness, urinary incontinence or tongue biting.     Had surgery for both basal cell and lung cancer. Basal cell was located above the lip. She had 2 lung cancers that were different types and had surgery and chemotherapy (second one) and oral chemotherapy. For the second cancer she had 8-10 infusions twice per week. Last infusion was 2 years ago. Symptoms of what she thought was a stroke occurred before she was diagnosed with lung cancer. She is now in remission. Has had sleep apnea but had surgery.     Risk factors: The patient has no history of febrile seizures, meningitis, encephalitis, head injuries with LOC, or family members with seizures.    Seizure/Spell description: type I: disorientation sensation, hearing loss in the right ear and hearing half gone in the left, lost taste (tasted metallic), smell, touch  in fingers of both hands, and the right eye deviated inward  Type II: sits, feels in a stupor, difficulty understanding all words spoken but able to respond, feels like things are slower, aware through the entire episode, lasts 30-45 minutes   Date of last seizure: March 2025 (type II), 2017 (type I)  Frequency of seizures: had 3 episodes in 3 months but none for 6 months  Triggers: none   Current AEDs: none  Current Adverse Effects: na  Past AEDs: none   MRI: 10/27/2008 - unremarkable  02/09/2023:Prior right cerebellar and bilateral internal capsular lacunar infarcts.   01/17/2024: Remote lacunar infarct in right cerebellum and remote microhemorrhage in left cerebellum, stable   EEG: routine 02/03/2024 - left temporal slowing   Onset: 2017  Risk Factors: denies any risk factors but paternal family history unknown   Driving: yes   Handedness: right     3 years of college and then got a 2 year degree in writing code   Lives by herself. She was married but husband passed away.   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 She reports concerns of these being TIAs and requested a referral from her pulmonologist to discuss with a neurology provider. Last episode was in March of 2025     Today's history (10/26/2023):    2017 - had an episode -  she was in car, developed visual changes - 4-5 images instead of one.  Lost hearing on left ear, impaired hearing on right ear.  Metallic taste and smell, cognitively slow, slow talking.  Metalic taste /smell resolved quickly, cognitive slowing / slowed speech was back to normal within a few weeks.  Vision / hearing returned over 4-16months.         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      More recently, her metallic taste has returned, slow motion, not responding to people well - spaced out.  No weakness, no numbness, no dysarthria.  People witnessed and said something's wrong with you.  spells lasted 30-32m of cog impairment.  She was placed back in her car and drove home (less than a block away).  Does not recall headache with these epidoses.  Went to bed and awoke fine the next day.       Has history of headaches.  Had cognitive impairment during these headaches as well.       3-4x over past 6-77mo.      Follow-up 02/10/2024:  Patient reports 3 episodes since December 2024:  Christmas - felt really weird, sat down, people were jerkily moving around, wanted to get salt and just sat, could not understand someone talking to her, she was asked if she was okay and she said yeah. She was able to understand bits and pieces, could hear but could not quite get all the words. Said she sat in a stupor and could not keep up with what was happening. It lasted 1-3 hours before completely gone. She was aware through the whole episode  Feb - She was at home and she sat on her couch and stared around for 30-45 minutes. She came out then went and took a nap. She was home alone and aware through the whole episode.   March - driving and sat until it went away - it was similar to the one on Feb but lasted 30 minutes   Since  March feeling in the fingers - feels like she is wearing plastic gloves in the fingers of both hands. It lasts for 30 minutes or less and maybe had 2 or 3.     She denies hearing loss but she felt things were slower. She does not recall having any metallic taste.   Past Medical History     Past Medical History:   Diagnosis Date    Anxiety     Basal cell cancer     Cataract     Hyperlipidemia     Hypertension     Lung cancer (CMS-HCC)     OSA (obstructive sleep apnea)     RLS (restless legs syndrome)  Stroke (CMS-HCC)     5 yrs ago, TIA X 2     Past Surgical History:   Procedure Laterality Date    APPENDECTOMY      CATARACT EXTRACTION, BILATERAL Bilateral 07/2021    COLONOSCOPY N/A 01/23/2013    Procedure: COLONOSCOPY WITH MAC;  Surgeon: Rankin Dellen, MD;  Location: [facility] ENDOSCOPY;  Service: Gastroenterology;  Laterality: N/A;    FLEXIBLE BRONCHOSCOPY W/ UPPER ENDOSCOPY N/A 09/17/2016    Procedure: BRONCHOSCOPY SUPER D WITH FLUORO AND BIOPSY-EBUS/NAVI;  Surgeon: Sadia Benzaquen, MD;  Location: [facility] ENDOSCOPY;  Service: Pulmonary;  Laterality: N/A;    INSERTION CATHETER VASCULAR ACCESS N/A 12/26/2021    Procedure: INSERTION POWER PORT A CATH;  Surgeon: Lucendia Blanch, MD;  Location: [facility] OR;  Service: General;  Laterality: N/A;    LOOP IMPLANT IMPLANTATION N/A 04/07/2017    Procedure: Loop  Implantation;  Surgeon: Lue Legions, MD;  Location: [facility] ELECTROPHYSIOLOGY LAB;  Service: Electrophysiology;  Laterality: N/A;    LOOP REMOVAL Left 04/09/2021    Procedure: Loop Removal;  Surgeon: Rory Osier, MD;  Location: [facility];  Service: Electrophysiology;  Laterality: Left;    submastoid gland removal      THORACOSCOPY Right 11/23/2016    Procedure: RIGHT SIDED VATS MIDDLE LOBE LOBECTOMY,HILAR MEDIASTINAL LYMPH NODE DISSECTION;  Surgeon: Nena Humbles, MD;  Location: [facility] OR;  Service: Thoracic;  Laterality: Right;    THORACOSCOPY LOBECTOMY VIDEO ASSISTED Left 11/18/2021    Procedure: Left Thoracoscopic  Lingulectomy;  Surgeon: Nena Humbles, MD;  Location: [facility] OR;  Service: General;  Laterality: Left;    TONSILLECTOMY      UVULOPALATOPHARYNGOPLASTY         Family History     Family History   Adopted: Yes   Problem Relation Age of Onset    Diabetes Son        Social History     Social History[1]    Allergy     Allergies[2]    ROS     10-point review of systems completed  Constitutional: no significant weight loss or fever   Skin: no rashes   Head: no headaches or dizziness   Eyes: no vision loss or pain   ENT: no hearing loss, nasal discharge or congestion   Respiratory: no shortness of breath or cough   Cardiac: no chest pain or palpitations    GI: no abdominal pain or change in bowel habits   Musculoskeletal: No joint pain or muscle aches   Neurological: No new numbness, tingling, or weakness   Psychiatric: denies anxiety or depression     Medications     Home Medications   Medication Sig Taking? Last Dose   aspirin  325 MG tablet Take 1 tablet (325 mg total) by mouth daily. Yes    atorvastatin  (LIPITOR ) 80 MG tablet Take 1 tablet (80 mg total) by mouth every morning. Yes    BIOTIN ORAL Take 1 tablet by mouth daily. Yes    busPIRone  (BUSPAR ) 10 MG tablet Take 1 tablet (10 mg total) by mouth in the morning and at bedtime. Yes    calcium -vitamin D  (OSCAL-500 + D) 500 mg-5 mcg (200 unit) per tablet Take 1 tablet by mouth daily.  Patient taking differently: Take 1 tablet by mouth daily. As needed Yes    gabapentin  (NEURONTIN ) 300 MG capsule Take 1 capsule (300 mg total) by mouth 3 times a day. Yes    leucovorin-pyridox-mecobalamin (FOLINIC-PLUS) 4-50-2 mg Tab Take 1 tablet by mouth daily. Yes  lipase -protease -amylase  (CREON ) 24,000-76,000 -120,000 unit CpDR Take 2 capsules by mouth 3 times a day as needed (for abdominal cramping). Take prior to meals Yes    lisinopriL  (PRINIVIL ) 10 MG tablet Take 1 tablet (10 mg total) by mouth daily. Yes    loperamide  (IMODIUM ) 2 mg capsule Take 1-2 capsules (2-4 mg total) by mouth  4 times a day as needed for Diarrhea. Take 4 mg (2 capsules) after the first loose stool and then 1 capsule for each loose stool thereafter Yes    MELATONIN ORAL Take 10 mg by mouth at bedtime as needed (sleep). Yes    multivitamin (THERAGRAN) tablet Take 1 tablet by mouth daily. Yes    ondansetron  (ZOFRAN ) 8 MG tablet Take 1 tablet (8 mg total) by mouth every 8 hours as needed for Nausea. Yes    PARoxetine  (PAXIL ) 20 MG tablet Take 1 tablet (20 mg total) by mouth at bedtime. Yes    polyethylene glycol (GOLYTELY ) 236-22.74-6.74 -5.86 gram solution Please take your prep according to the instructions outlined in your prep letter in messages and communications. Yes    roPINIRole  (REQUIP ) 1 MG tablet Take 1 tablet (1 mg total) by mouth at bedtime. Indications: Restless Legs Syndrome Yes        Physical Exam:     Blood pressure 111/83, pulse 80, height 5' 7 (1.702 m), weight 186 lb (84.4 kg), SpO2 95%.  General: Alert and oriented to self, location and situation, NAD, cooperative  HEENT: NC/AT, EOMI, PERRL, MMM, no carotid bruits, neck supple, no meningeal signs  Fundoscopic: no disc edema appreciated  Heart: RRR, S1/S2  Lungs: symmetric motion of chest wall, no signs of respiratory distress  Abdomen: soft/NT/ND  Ext: no edema  Psych: affect and mood appropriate  Skin: no rashes or lesions    Neurologic Exam:  Mental Status: Alert and oriented to self, location and situation. Good fund of knowledge, appropriate reasoning, able to think abstractly.     Language: Speech clear, language fluent, repetition and naming intact, follows commands appropriately.     Cranial Nerves II-XII: PERRL, VFF, no nystagmus, no gaze paresis, sensation V1-V3 intact b/l, muscles of facial expression symmetric, hearing intact to conversational tone, palate elevates symmetrically, shoulder elevation symmetric, tongue protrudes midline with movement side to side.     Motor exam: Strength 5/5 UE/LE's b/l, tone and bulk normal, no pronator  drift  Strength (R/L motor scale)  UE:   Shoulder abductors 5/5   Arm flexors 5/5 Arm extensors 5/5   Wrist extensors 5/5 Wrist extensors 5/5   Finger flexors: 5/5 Finger extensors: 5/5  Interosseous 5/5    LE:   Hip flexors 5/5  Leg flexors 5/5 Leg extensors 5/5  Dorsoflexors 5/5 Plantarflexors 5/5     Sensory: intact diffusely to temp, light touch and vibratory touch; proprioception intact    Deep Tendon Reflexes: 2+ b/l in biceps, brachioradialis, triceps, patella, and achilles; toes   downgoing b/l; neg. Hoffman's, no clonus   (R/L)  Biceps 2/2  Brachioradialis 2/2  Triceps 2/2  Quadriceps 2/2  Achilles 2/2  Toes: down/down   Hoffman: neg/neg  Clonus: none/none    Coordination/Cerebellum: No dysmetria, dysdiadochokinesia, or ataxia noted; no tremor    Gait: normal gait and base w/ standard walk and tandem walk        LABS     Lab Results   Component Value Date    GLUCOSE 83 11/29/2023    BUN 27 (H) 11/29/2023    CO2  23 11/29/2023    CREATININE 1.18 11/29/2023    K 4.4 11/29/2023    NA 140 11/29/2023    CL 107 11/29/2023    CALCIUM  9.6 11/29/2023     No results found for: GLUFAST  Lab Results   Component Value Date    WBC 10.1 11/29/2023    HGB 13.2 11/29/2023    HCT 39.3 11/29/2023    MCV 93.9 11/29/2023    PLT 176 11/29/2023     Lab Results   Component Value Date    INR 0.9 09/08/2023     Lab Results   Component Value Date    CHOLTOT 174 02/22/2017    TRIG 249 (H) 02/22/2017    HDL 41 (L) 02/22/2017    LDL 83 02/22/2017     Lab Results   Component Value Date    HGBA1C 5.5 02/19/2017     Lab Results   Component Value Date    GLUCCSF 56 02/22/2017       Scans:  No results found.      ASSESSMENT/PLAN :     Joanna Lopez is a 73 y.o. White or Caucasian right handed female with PMH basal cell carcinoma, lung cancer, anxiety, HTN, HLD presenting for further evaluation of episodes she had in 2017. She had 2 episodes around 2017 and 3 episodes in between December 2024 and March 2025. MRI brain with no new  findings. EEG with left temporal slowing which raises some concern for structural abnormality but not definitive evidence for having seizures. Discussed starting a seizure medication to assess if any improvement in episodes.     Plan:  - start Keppra XR 1000 mg daily  - consider EMU if episodes recur or become more frequent   - check vitamin B6, B12, TSH, HbA1c to assess for neuropathy    Patient was seen and discussed with the attending physician, Dr. Oneil Right, who is in agreement with the above assessment and plan.    RTC 3 months     Joanna EVERITT BIBLES, MD  Epilepsy Fellow, PGY-6  12:23 PM  02/10/2024            [1]   Social History  Tobacco Use    Smoking status: Never    Smokeless tobacco: Never   Vaping Use    Vaping status: Never Used   Substance Use Topics    Alcohol use: Not Currently     Comment: occasionally    Drug use: No   [2]   Allergies  Allergen Reactions    Penicillins Anaphylaxis     Facial Swelling  immediate rash, facial/tongue/throat swelling, SOB or lightheadedness with hypotension,   severe rash involving mucus membranes or skin necrosis    Adhesive      Other reaction(s): breaks out and itches if on for long time    Ciprofloxacin  Other (See Comments)     Tendonitis.     Formaldehyde Analogues     Nsaids (Non-Steroidal Anti-Inflammatory Drug)      GI issues    Promethazine  Hcl      Restless legs    Latex Rash    Sulfa (Sulfonamide Antibiotics) Rash

## 2024-02-14 ENCOUNTER — Inpatient Hospital Stay: Payer: MEDICARE

## 2024-02-15 ENCOUNTER — Encounter

## 2024-02-15 MED ORDER — LIDOCAINE (PF) 20 MG/ML (2 %) INTRAVENOUS SOLUTION
20 | INTRAVENOUS | PRN
Start: 2024-02-15 — End: 2024-02-15
  Administered 2024-02-15: 12:00:00 via INTRAVENOUS

## 2024-02-15 MED ORDER — PROPOFOL 10 MG/ML FOR OR USE ONLY
10 | INTRAVENOUS | PRN
Start: 2024-02-15 — End: 2024-02-15
  Administered 2024-02-15: 12:00:00 via INTRAVENOUS

## 2024-02-15 MED ORDER — SODIUM CHLORIDE 0.9 % INTRAVENOUS SOLUTION
INTRAVENOUS
Start: 2024-02-15 — End: 2024-02-15
  Administered 2024-02-15: 12:00:00 via INTRAVENOUS

## 2024-02-15 MED ORDER — PROPOFOL INFUSION 10 MG/ML
10 | INTRAVENOUS | PRN
Start: 2024-02-15 — End: 2024-02-15
  Administered 2024-02-15: 12:00:00 via INTRAVENOUS

## 2024-02-15 MED FILL — SODIUM CHLORIDE 0.9 % INTRAVENOUS SOLUTION: 50.0000 50.0000 mL/hr | INTRAVENOUS | Qty: 1000 | Fill #0

## 2024-02-15 NOTE — Anesthesia Post-Procedure Evaluation (Signed)
 Anesthesia Post Note    Patient: Joanna Lopez    Procedure(s) Performed: Procedure(s):  COLONOSCOPY W/ OR W/O BIOPSY    Anesthesia type: MAC    Patient location: Same Day Surgery    Airway: Patent    Post pain: Adequate analgesia    Nausea / Vomiting: Absent    Post-operative Hydration Status: Adequate    Post assessment: no apparent anesthetic complications and tolerated procedure well    Last Vitals:   Vitals:    02/15/24 0835 02/15/24 0840 02/15/24 0845 02/15/24 0856   BP: 136/82 143/82 148/68 (!) 149/96   BP Location:       Pulse: 73 68 65 66   Resp:  18 20 18    Temp:    97.6 F (36.4 C)   TempSrc:    Oral   SpO2: 99% 100% 99% 99%   Weight:       Height:            Last Temperature: 97.6 F (36.4 C) (02/15/2024  8:56 AM)      Post vital signs: stable    Level of consciousness: awake, alert , and oriented    Complications:  There were no known notable events for this encounter.

## 2024-02-15 NOTE — Op Note (Signed)
 [facility]  GI   _______________________________________________________________________________  Procedure Date: 02/15/2024 7:00 AM     Patient Name: Joanna Lopez  MRN: [id number]                         Account Number: [id number]  Date of Birth: Aug 06, 1950              Admit Type: Outpatient  Age: 73                               Gender: Female  Note Status: Finalized                Attending MD: [name] Beckmann , MD, [id number]  _______________________________________________________________________________     Procedure:               Colonoscopy  Indications:             High risk colon cancer surveillance: Personal                            history of adenomatous colonic polyps  Providers:               [name] Beckmann, MD  Referring MD:            Asberry Jansky Hardt  Medicines:               Monitored Anesthesia Care  Complications:           No immediate complications.  _______________________________________________________________________________  Procedure:               Pre-Anesthesia Assessment:                           - Prior to the procedure, a History and Physical was                            performed, and patient medications and allergies                            were reviewed. The patient is competent. The risks                            and benefits of the procedure and the sedation                            options and risks were discussed with the patient.                            All questions were answered and informed consent was                            obtained. Patient identification and proposed                            procedure were verified by the physician, the nurse  and the anesthetist in the procedure room. Mental                            Status Examination: alert and oriented. Airway                            Examination: normal oropharyngeal airway and neck                            mobility. ASA Grade Assessment: II - A  patient with                            mild systemic disease. After reviewing the risks and                            benefits, the patient was deemed in satisfactory                            condition to undergo the procedure. The anesthesia                            plan was to use monitored anesthesia care (MAC).                            Immediately prior to administration of medications,                            the patient was re-assessed for adequacy to receive                            sedatives. The heart rate, respiratory rate, oxygen                            saturations, blood pressure, adequacy of pulmonary                            ventilation, and response to care were monitored                            throughout the procedure. The physical status of the                            patient was re-assessed after the procedure.                           - ASA Grade Assessment: II - A patient with mild                            systemic disease.                           After I obtained informed consent, the scope was  passed under direct vision. Throughout the                            procedure, the patient's blood pressure, pulse, and                            oxygen saturations were monitored continuously. The                            endoscope was introduced through the anus and                            advanced to the cecum, identified by appendiceal                            orifice and ileocecal valve. The colonoscopy was                            performed without difficulty. The patient tolerated                            the procedure well. The quality of the bowel                            preparation was good. The ileocecal valve,                            appendiceal orifice, and rectum were photographed.                                                                                   Findings:       The perianal and digital rectal  examinations were normal.       Multiple medium-mouthed and small-mouthed diverticula were found in the        sigmoid colon and transverse colon.       The exam was otherwise without abnormality.                                                                                   Estimated Blood Loss:       Estimated blood loss: none.  Impression:              - Diverticulosis in the sigmoid colon and in the                            transverse colon.                           -  The examination was otherwise normal.                           - No specimens collected.  Recommendation:          - Discharge patient to home.                           - Resume previous diet.                           - Continue present medications.                           - Repeat colonoscopy in 5 years for screening                            purposes. To be discussed with referring.                           - Return to referring provider as previously                            scheduled.                           - The findings and recommendations were discussed                            with the patient and their family.                                                                                   Procedure Code(s):       --- Professional ---                           H9894, Colorectal cancer screening; colonoscopy on                            individual at high risk  Diagnosis Code(s):       --- Professional ---       Z86.0101, Personal history of adenomatous and serrated colon polyps                                                                                     CPT copyright 2023 American Medical Association. All rights reserved.    The codes documented in this report are preliminary and upon coder review may   be  revised to meet current compliance requirements.    Dr. Janice Bodine  _______________  [name] Beckmann, MD  02/15/2024 8:22:50 AM  Scope Withdrawal Time 0 hours 7 minutes 48 seconds   Total Procedure Duration Time  0 hours 14 minutes 43 seconds   Scope In: 8:05:38 AM  Scope Out: 8:20:21 AM

## 2024-02-15 NOTE — Anesthesia Pre-Procedure Evaluation (Addendum)
 [place]  DEPARTMENT OF ANESTHESIOLOGY  PRE-PROCEDURAL EVALUATION    Joanna Lopez is a 73 y.o. year old female presenting for:    Procedure(s):  COLONOSCOPY W/ OR W/O BIOPSY    Surgeon:   [name] CHRISTELLA Beckmann, MD    Chief Complaint     Diarrhea, unspecified type [R19.7]    Review of Systems     Anesthesia Evaluation    Patient summary reviewed, nursing notes reviewed and Previous anesthetic and pre-operative note copied, reviewed and updated as necessary.       No history of anesthetic complications   I have reviewed the History and Physical Exam, any relevant changes are noted in the anesthesia pre-operative evaluation.      Cardiovascular:    Exercise tolerance: (Endurance improving since previous surgery)  Duke Met score: 3 - Walking on a flat surface for one or two blocks.  (+) hyperlipidemia.    Hypertension is well controlled.  Valvular problems/murmurs related to MR.    (-) past MI, CAD, CABG/stent, dysrhythmias, CHF.  ROS comment:   ECHO limited with contrast 02/23/17  Impressions:  No evidence of apical thrombus on definity  echo      ECHO complete with bubble study 02/22/2017  Study Conclusions   - Left ventricle: The cavity size was normal. Wall thickness was     normal. Systolic function was normal. The estimated ejection     fraction was in the range of 60% to 65%. Wall motion was normal;     there were no regional wall motion abnormalities.There was a     small, well circumscribed echodensity at the left ventricular     apex, of uncertain etiology. There are no accompanying wall motion     abnormalities to suggest an apical thrombus. Doppler parameters     are consistent with abnormal left ventricular relaxation (grade 1     diastolic dysfunction).   - Mitral valve: Mild regurgitation.   - Right ventricle: Systolic function was normal.   - Atrial septum: No defect or patent foramen ovale was identified.   - Pulmonic valve: Peak gradient (S): 4mm Hg.       Neuro/Muscoloskeletal/Psych:    (+)  neuromuscular disease (< 50% LICA stenosis, RLS), TIA, headaches (Migraines) and anxiety.    CVA (no residual symptoms).  (-) seizures.     Pulmonary:          Sleep apnea (Not on cpap, s/p UPP).    (-) COPD, asthma.   ROS comment: S/p right VATS middle lobectomy in 11/2016. . S/p lingulectomy 11/18/21. Denies any symptoms        GI/Hepatic/Renal:      GERD is well controlled.      (-) liver disease, renal disease.    Endo/Other:    (+) cancer (lung  cancer s/p resection).      (-) diabetes mellitus, hypothyroidism, hyperthyroidism, no anemia, no thrombocytopenia.          Past Medical History     Past Medical History:   Diagnosis Date    Anxiety     Basal cell cancer     Cataract     Hyperlipidemia     Hypertension     Lung cancer (CMS-HCC)     OSA (obstructive sleep apnea)     RLS (restless legs syndrome)     Stroke (CMS-HCC)     5 yrs ago, TIA X 2       Past Surgical History  Past Surgical History:   Procedure Laterality Date    APPENDECTOMY      CATARACT EXTRACTION, BILATERAL Bilateral 07/2021    COLONOSCOPY N/A 01/23/2013    Procedure: COLONOSCOPY WITH MAC;  Surgeon: Rankin Dellen, MD;  Location: [facility] ENDOSCOPY;  Service: Gastroenterology;  Laterality: N/A;    FLEXIBLE BRONCHOSCOPY W/ UPPER ENDOSCOPY N/A 09/17/2016    Procedure: BRONCHOSCOPY SUPER D WITH FLUORO AND BIOPSY-EBUS/NAVI;  Surgeon: Sadia Benzaquen, MD;  Location: [facility] ENDOSCOPY;  Service: Pulmonary;  Laterality: N/A;    INSERTION CATHETER VASCULAR ACCESS N/A 12/26/2021    Procedure: INSERTION POWER PORT A CATH;  Surgeon: Lucendia Blanch, MD;  Location: [facility] OR;  Service: General;  Laterality: N/A;    LOOP IMPLANT IMPLANTATION N/A 04/07/2017    Procedure: Loop  Implantation;  Surgeon: Lue Legions, MD;  Location: [facility] ELECTROPHYSIOLOGY LAB;  Service: Electrophysiology;  Laterality: N/A;    LOOP REMOVAL Left 04/09/2021    Procedure: Loop Removal;  Surgeon: Rory Osier, MD;  Location: [facility] SUITE;  Service: Electrophysiology;  Laterality: Left;     submastoid gland removal      THORACOSCOPY Right 11/23/2016    Procedure: RIGHT SIDED VATS MIDDLE LOBE LOBECTOMY,HILAR MEDIASTINAL LYMPH NODE DISSECTION;  Surgeon: Nena Humbles, MD;  Location: [facility] OR;  Service: Thoracic;  Laterality: Right;    THORACOSCOPY LOBECTOMY VIDEO ASSISTED Left 11/18/2021    Procedure: Left Thoracoscopic Lingulectomy;  Surgeon: Nena Humbles, MD;  Location: [facility] OR;  Service: General;  Laterality: Left;    TONSILLECTOMY      UVULOPALATOPHARYNGOPLASTY         Family History     Family History   Adopted: Yes   Problem Relation Age of Onset    Diabetes Son        Social History     Social History     Socioeconomic History    Marital status: Widowed     Spouse name: Not on file    Number of children: Not on file    Years of education: Not on file    Highest education level: Not on file   Occupational History    Not on file   Tobacco Use    Smoking status: Never    Smokeless tobacco: Never   Vaping Use    Vaping status: Never Used   Substance and Sexual Activity    Alcohol use: Not Currently     Comment: occasionally    Drug use: No    Sexual activity: Not Currently     Partners: Male   Other Topics Concern    Caffeine Use No     Comment: 1 can coke/day    Occupational Exposure Yes     Comment: Geographical information systems officer    Exercise No    Seat Belt Yes   Social History Narrative    Born [place] raised [place] , also had lived in [place]; [place] in the 1970[place]; [place], [place]; [employer]; and here in [place]    She's travelled to [place] 11[place], and then again 2022, [place] with a [place], swimming in fresh water  rivers and ocean, eating local foods, street foods.    [place], [place] and also around the [place] 2017 or so.     Cruises to [place], [place]  last year.                   Social Drivers of Psychologist, prison and probation services Strain: Not  on file   Food Insecurity: No Food Insecurity (10/25/2023)    Yearly Questionnaire     Do you need any  assistance with obtaining housing, meals, medication, transportation or medical equipment?: No     Assistance needed for:: Not on file   Transportation Needs: No Transportation Needs (10/25/2023)    Yearly Questionnaire     Do you need any assistance with obtaining housing, meals, medication, transportation or medical equipment?: No     Assistance needed for:: Not on file   Physical Activity: Not on file   Stress: Not on file   Social Connections: Not on file   Intimate Partner Violence: Not At Risk (02/15/2024)    Humiliation, Afraid, Rape, and Kick questionnaire     Fear of Current or Ex-Partner: No     Emotionally Abused: No     Physically Abused: No     Sexually Abused: No   Housing Stability: Low Risk  (10/25/2023)    Yearly Questionnaire     Do you need any assistance with obtaining housing, meals, medication, transportation or medical equipment?: No     Assistance needed for:: Not on file       Medications     Allergies:  Allergies   Allergen Reactions    Penicillins Anaphylaxis     Facial Swelling  immediate rash, facial/tongue/throat swelling, SOB or lightheadedness with hypotension,   severe rash involving mucus membranes or skin necrosis    Adhesive      Other reaction(s): breaks out and itches if on for long time    Ciprofloxacin  Other (See Comments)     Tendonitis.     Formaldehyde Analogues     Nsaids (Non-Steroidal Anti-Inflammatory Drug)      GI issues    Promethazine  Hcl      Restless legs    Latex Rash    Sulfa (Sulfonamide Antibiotics) Rash       Home Meds:  Prior to Admission medications as of 12/23/21 1501   Medication Sig Taking?   aspirin  325 MG tablet Take 1 tablet (325 mg total) by mouth daily. Yes   atorvastatin  (LIPITOR ) 80 MG tablet Take 1 tablet (80 mg total) by mouth daily. Yes   calcium  carbonate (CALCIUM  300 ORAL) Take by mouth. Yes   folic acid  (FOLVITE ) 1 MG tablet Take 1 tablet (1 mg total) by mouth daily. Start 1 week before first cycle and continue for 21 days after the last dose of  pemetrexed . Yes   lisinopriL  (PRINIVIL ) 10 MG tablet Take 1 tablet (10 mg total) by mouth daily. 10 mg (half tablet) daily Yes   MELATONIN ORAL Take 3-5 mg by mouth at bedtime. PRN for sleep Yes   multivitamin (THERAGRAN) tablet Take 1 tablet by mouth daily. Yes   PARoxetine  (PAXIL ) 20 MG tablet Take 1 tablet (20 mg total) by mouth daily. Yes   roPINIRole  (REQUIP ) 0.5 MG tablet Take 1 tablet (0.5 mg total) by mouth daily. Yes   aspirin -acetaminophen -caffeine (EXCEDRIN) 250-250-65 mg per tablet Take 1 tablet by mouth every 6 hours as needed for Pain.    dexAMETHasone  (DECADRON ) 4 MG tablet Take 4 mg twice a day for five days starting the day before pemetrexed .    diclofenac  (VOLTAREN ) 0.1 % ophthalmic solution 1 drop if needed (ear itching). ears    lidocaine -prilocaine  (EMLA ) cream Apply topically if needed.    lipase /protease /amylase  (CREON  ORAL) Take 1 capsule by mouth if needed (stomach pain).  Patient not taking: Reported on 12/23/2021  naloxone  (NARCAN ) 4 mg/actuation Spry Apply 1 spray in one nostril if needed. Call 911. May repeat dose in other nostril if no response in 3 minutes.    proCHLORPERazine  (COMPAZINE ) 10 MG tablet Take 1 tablet (10 mg total) by mouth every 6 hours as needed (For nausea and vomiting).        Inpatient Meds:  Scheduled:       Continuous:    sodium chloride  0.9 %         PRN:     Vital Signs     Wt Readings from Last 3 Encounters:   02/10/24 186 lb (84.4 kg)   01/17/24 184 lb (83.5 kg)   12/22/23 185 lb (83.9 kg)     Ht Readings from Last 3 Encounters:   02/10/24 5' 7 (1.702 m)   12/22/23 5' 7 (1.702 m)   12/10/23 5' 7 (1.702 m)     Temp Readings from Last 3 Encounters:   10/26/23 97.6 F (36.4 C) (Temporal)   10/15/23 97.5 F (36.4 C) (Temporal)   09/17/23 96.3 F (35.7 C) (Temporal)     BP Readings from Last 3 Encounters:   02/10/24 111/83   12/22/23 120/82   12/10/23 134/79     Pulse Readings from Last 3 Encounters:   02/10/24 80   12/22/23 90   12/10/23 80      @LASTSAO2 (3)@    Physical Exam     Airway:     Mallampati: II  TM distance: > = 3 FB  Neck ROM: full  (-) no facial hair, neck not short, not intubated      Dental:   - No obvious cracked, loose, chipped, or missing teeth.     Pulmonary:      Breathing: unlabored         Cardiovascular:     Rhythm: regular  Rate: normal    Neuro/Musculoskeletal/Psych:     Mental status: alert and oriented to person, place and time.          Abdominal:       Current OB Status:       Other Findings:        Laboratory Data     Lab Results   Component Value Date    WBC 10.1 11/29/2023    HGB 13.2 11/29/2023    HCT 39.3 11/29/2023    MCV 93.9 11/29/2023    PLT 176 11/29/2023       No results found for: [facility]    Lab Results   Component Value Date    GLUCOSE 83 11/29/2023    BUN 27 (H) 11/29/2023    CO2 23 11/29/2023    CREATININE 1.18 11/29/2023    K 4.4 11/29/2023    NA 140 11/29/2023    CL 107 11/29/2023    CALCIUM  9.6 11/29/2023    ALBUMIN 4.2 12/22/2023    PROT 6.9 12/22/2023    ALKPHOS 77 12/22/2023    ALT 24 12/22/2023    AST 22 09/17/2023    BILITOT 0.5 12/22/2023       Lab Results   Component Value Date    INR 0.9 09/08/2023       No results found for: PREGTESTUR, PREGSERUM, HCG, HCGQUANT  PFT's 11/06/21        Component 11/06/21 1035   FVC 3.41   FVC% 112   FEV1 2.67   FEV1% 114   FEV1/FVC 78   FEV1/FVC EXP 64   VC 3.30  VC% 102   RV 2.02   RV% 95   TLC 5.32   TLC% 99   DLCO 16.86   DLCO% 84   RESPONSE TO BRONCHODILATOR 2% FVC, 3% FEV1         Spirometry is normal.   There is no response to bronchodilator demonstrated.   Lung volumes are normal.   The flow-volume loop is normal.   Diffusing capacity is normal.     There have been no significant changes since test from 2020.      CT chest 09/23/21  FINDINGS:  MEDICAL DEVICES: None.    AIRWAYS & LUNGS: Stable postsurgical changes of right middle lobectomy and mild upper zone predominant emphysema. The lungs are clear.    NODULES: Slight interval increased size of the  subsolid left upper lobe nodule which measures roughly 11 x 8 mm (image 144 series 302), previously 9 x 7 mm. This continues to demonstrates slight increase in size and density from the June 2020 examination. No new suspicious pulmonary nodules.    PLEURA: No pleural effusions or pneumothorax.    LOWER NECK: Unremarkable.    HEART: The heart is normal in size.    VASCULAR STRUCTURES: Aorta and main pulmonary artery are normal in caliber.    MEDIASTINUM AND HILA: No pathologically enlarged lymph nodes.    CHEST WALL AND AXILLA: Unremarkable.    UPPER ABDOMEN: Multiple hepatic cysts are unchanged. Multiple bilateral renal parapelvic cysts.    OSSEOUS STRUCTURES: No suspicious osteolytic or osteoblastic lesions.    Impression   IMPRESSION:    Slight interval increase size of the left upper lobe subsolid nodule, which remains indeterminate but is concerning for an indolent primary lung malignancy. Continued follow-up is recommended.      Anesthesia Plan     ASA 3       Female and current non-smoker    Anesthesia Type:  MAC.      PONV Risk Factors: female, current non-smoker      Planned Special Techniques: Planned Special Techniques:  one lung ventilation.    Induction: Intravenous induction.      Anesthetic plan and risks discussed with patient.    Plan, alternatives, and risks of anesthesia, including death, have been explained to and discussed with the patient/legal guardian.  By my assessment, the patient/legal guardian understands and agrees.  Scenario presented in detail.  Questions answered.    Blood products not discussed.      Plan discussed with CRNA and attending.

## 2024-02-15 NOTE — Other (Signed)
 Endoscopy  Post Procedure Briefing Note: Joanna Lopez      Specimens:     Prior to leaving the room the nurse confirmed procedure, specimen labeling, prep and diagnosis with MD.  Are there any recovery management concerns or post procedure orders?   No      Other Comments:     Signed: Sherrilee Bernardino Molt, RN    Date: 02/15/2024    Time: 8:21 AM

## 2024-02-15 NOTE — Progress Notes (Signed)
 0825 - arrival from endo; report received from staff and CRNA; assessment as noted; pt denies complaints; awake and alert; family to bedside.  0830 - HOB elevated and given diet ginger ale.  9162 - tolerating oral intake well; discharge instructions reviewed with pt and friend; verbalize understanding.  Battista.Bolus - MD speaking with pt and friend; normal colonoscopy; 5 year recommendation with pt's choice to continue or not.  0912 - Pt. Discharged per wheelchair to exit.      Discharge Pain level- 0/10 Patient states pain is tolerable.    Patient and family verbalized understanding of discharge instructions.      Vitals:    02/15/24 0856   BP: (!) 149/96   Pulse: 66   Resp: 18   Temp: 97.6 F (36.4 C)   SpO2: 99%       Pt will take BP medication upon arrival to home; voids per BR prior to discharge.      No intake/output data recorded.      SUZEN JOLLY, RN

## 2024-02-15 NOTE — Discharge Instructions (Signed)
 For any questions or concerns, please call the office of Dr. Elroy @ [phone]

## 2024-02-15 NOTE — TOC Discharge Planning (AHS/AVS) (Signed)
 Anesthesia Transfer of Care Note    Patient: Joanna Lopez  Procedure(s) Performed: Procedure(s):  COLONOSCOPY W/ OR W/O BIOPSY    Patient location: Same Day Surgery    Anesthesia type: MAC    Airway Device on Arrival to PACU/ICU: Room Air    IV Access: Peripheral    Monitors Recommended to be Used During PACU/ICU: Standard Monitors    Outstanding Issues to Address: None    Level of Consciousness: awake, alert , and oriented    Post vital signs:    Vitals:    02/15/24 0830   BP: 113/78   Pulse: 66   Resp: 13   Temp:    SpO2: 98%       Complications:  No notable events documented.

## 2024-02-15 NOTE — H&P (Signed)
 GI HISTORY & PHYSICAL    02/15/2024  7:56 AM    Joanna Lopez is an 73 y.o. female.    Active Problems:    * No active hospital problems. *      History:  Screening colonoscopy      Past Medical History:   Diagnosis Date    Anxiety     Basal cell cancer     Cataract     Hyperlipidemia     Hypertension     Lung cancer (CMS-HCC)     OSA (obstructive sleep apnea)     RLS (restless legs syndrome)     Stroke (CMS-HCC)     5 yrs ago, TIA X 2     Past Surgical History:   Procedure Laterality Date    APPENDECTOMY      CATARACT EXTRACTION, BILATERAL Bilateral 07/2021    COLONOSCOPY N/A 01/23/2013    Procedure: COLONOSCOPY WITH MAC;  Surgeon: Rankin Dellen, MD;  Location: [facility] ENDOSCOPY;  Service: Gastroenterology;  Laterality: N/A;    FLEXIBLE BRONCHOSCOPY W/ UPPER ENDOSCOPY N/A 09/17/2016    Procedure: BRONCHOSCOPY SUPER D WITH FLUORO AND BIOPSY-EBUS/NAVI;  Surgeon: Sadia Benzaquen, MD;  Location: [facility] ENDOSCOPY;  Service: Pulmonary;  Laterality: N/A;    INSERTION CATHETER VASCULAR ACCESS N/A 12/26/2021    Procedure: INSERTION POWER PORT A CATH;  Surgeon: Lucendia Blanch, MD;  Location: [facility] OR;  Service: General;  Laterality: N/A;    LOOP IMPLANT IMPLANTATION N/A 04/07/2017    Procedure: Loop  Implantation;  Surgeon: Lue Legions, MD;  Location: [facility] ELECTROPHYSIOLOGY LAB;  Service: Electrophysiology;  Laterality: N/A;    LOOP REMOVAL Left 04/09/2021    Procedure: Loop Removal;  Surgeon: Rory Osier, MD;  Location: [facility] MIS SUITE;  Service: Electrophysiology;  Laterality: Left;    submastoid gland removal      THORACOSCOPY Right 11/23/2016    Procedure: RIGHT SIDED VATS MIDDLE LOBE LOBECTOMY,HILAR MEDIASTINAL LYMPH NODE DISSECTION;  Surgeon: Nena Humbles, MD;  Location: [facility] OR;  Service: Thoracic;  Laterality: Right;    THORACOSCOPY LOBECTOMY VIDEO ASSISTED Left 11/18/2021    Procedure: Left Thoracoscopic Lingulectomy;  Surgeon: Nena Humbles, MD;  Location: [facility] OR;  Service: General;  Laterality: Left;     TONSILLECTOMY      UVULOPALATOPHARYNGOPLASTY       Family History   Adopted: Yes   Problem Relation Age of Onset    Diabetes Son      Social History[1]  Allergies[2]    Prior to Admission Meds:  Home Medications   Medication Sig Taking? Last Dose   aspirin  325 MG tablet Take 1 tablet (325 mg total) by mouth daily. Yes Past Week   atorvastatin  (LIPITOR ) 80 MG tablet Take 1 tablet (80 mg total) by mouth every morning. Yes 02/14/2024 Morning   BIOTIN ORAL Take 1 tablet by mouth daily. Yes 02/14/2024 Morning   busPIRone  (BUSPAR ) 10 MG tablet Take 1 tablet (10 mg total) by mouth in the morning and at bedtime. Yes 02/14/2024 Morning   gabapentin  (NEURONTIN ) 300 MG capsule Take 1 capsule (300 mg total) by mouth 3 times a day. Yes 02/14/2024 Morning   leucovorin-pyridox-mecobalamin (FOLINIC-PLUS) 4-50-2 mg Tab Take 1 tablet by mouth daily. Yes 02/14/2024 Morning   levETIRAcetam ER (KEPPRA XR) 500 mg 24 hr tablet Take 2 tablets (1,000 mg total) by mouth daily. Yes 02/14/2024 Morning   lisinopriL  (PRINIVIL ) 10 MG tablet Take 1 tablet (10 mg total) by mouth daily. Yes 02/14/2024 Morning   loperamide  (IMODIUM ) 2  mg capsule Take 1-2 capsules (2-4 mg total) by mouth 4 times a day as needed for Diarrhea. Take 4 mg (2 capsules) after the first loose stool and then 1 capsule for each loose stool thereafter Yes Past Month   MELATONIN ORAL Take 10 mg by mouth at bedtime as needed (sleep). Yes Past Week   PARoxetine  (PAXIL ) 20 MG tablet Take 1 tablet (20 mg total) by mouth at bedtime. Yes Past Week   polyethylene glycol (GOLYTELY ) 236-22.74-6.74 -5.86 gram solution Please take your prep according to the instructions outlined in your prep letter in messages and communications. Yes 02/14/2024 Noon   roPINIRole  (REQUIP ) 1 MG tablet Take 1 tablet (1 mg total) by mouth at bedtime. Indications: Restless Legs Syndrome Yes 02/13/2024 Evening   calcium -vitamin D  (OSCAL-500 + D) 500 mg-5 mcg (200 unit) per tablet Take 1 tablet by mouth daily.  Patient  taking differently: Take 1 tablet by mouth daily. As needed  More than a month   lipase -protease -amylase  (CREON ) 24,000-76,000 -120,000 unit CpDR Take 2 capsules by mouth 3 times a day as needed (for abdominal cramping). Take prior to meals  More than a month   multivitamin (THERAGRAN) tablet Take 1 tablet by mouth daily.  More than a month   ondansetron  (ZOFRAN ) 8 MG tablet Take 1 tablet (8 mg total) by mouth every 8 hours as needed for Nausea.  More than a month       Review of Systems    Vitals:  Blood pressure 128/90, pulse 66, temperature 98.5 F (36.9 C), temperature source Oral, resp. rate 13, height 5' 7 (1.702 m), weight 181 lb (82.1 kg), SpO2 98%.    Physical Exam    Labs:   Lab Results   Component Value Date    GLUCOSE 83 11/29/2023    BUN 27 (H) 11/29/2023    CO2 23 11/29/2023    CREATININE 1.18 11/29/2023    K 4.4 11/29/2023    NA 140 11/29/2023    CL 107 11/29/2023    CALCIUM  9.6 11/29/2023     Lab Results   Component Value Date    MG 1.7 09/17/2023     Lab Results   Component Value Date    PHOS 3.8 11/29/2023     Lab Results   Component Value Date    WBC 10.1 11/29/2023    HGB 13.2 11/29/2023    HCT 39.3 11/29/2023    MCV 93.9 11/29/2023    PLT 176 11/29/2023     Lab Results   Component Value Date    INR 0.9 09/08/2023       Assessment/Plan:  Colonoscopy    Berry Gallacher CHRISTELLA BECKMANN, MD  02/15/2024       [1]   Social History  Tobacco Use    Smoking status: Never    Smokeless tobacco: Never   Vaping Use    Vaping status: Never Used   Substance Use Topics    Alcohol use: Not Currently     Comment: occasionally    Drug use: No   [2]   Allergies  Allergen Reactions    Penicillins Anaphylaxis     Facial Swelling  immediate rash, facial/tongue/throat swelling, SOB or lightheadedness with hypotension,   severe rash involving mucus membranes or skin necrosis    Adhesive      Other reaction(s): breaks out and itches if on for long time    Ciprofloxacin  Other (See Comments)     Tendonitis.     Formaldehyde Analogues  Nsaids (Non-Steroidal Anti-Inflammatory Drug)      GI issues    Promethazine  Hcl      Restless legs    Latex Rash    Sulfa (Sulfonamide Antibiotics) Rash

## 2024-02-17 NOTE — Telephone Encounter (Signed)
 02/17/24 left a message that we need to reschedule her appointment due to scheduling issues.

## 2024-02-18 ENCOUNTER — Ambulatory Visit: Payer: MEDICARE

## 2024-04-17 ENCOUNTER — Inpatient Hospital Stay: Payer: MEDICARE | Attending: Surgical Oncology

## 2024-04-18 ENCOUNTER — Inpatient Hospital Stay
Admit: 2024-04-18 | Discharge: 2024-04-18 | Disposition: A | Discharge status: Home / Self Care | Payer: MEDICARE | Arrived: VH

## 2024-04-18 ENCOUNTER — Emergency Department: Admit: 2024-04-18 | Payer: MEDICARE

## 2024-04-18 DIAGNOSIS — R109 Unspecified abdominal pain: Principal | ICD-10-CM

## 2024-04-18 DIAGNOSIS — R1012 Left upper quadrant pain: Secondary | ICD-10-CM

## 2024-04-18 LAB — DIFFERENTIAL
Basophils Absolute: 43 /uL (ref 0–108)
Basophils Relative: 0.4 % (ref 0.0–1.0)
Eosinophils Absolute: 214 /uL (ref 0–864)
Eosinophils Relative: 2 % (ref 0.0–8.0)
Lymphocytes Absolute: 2322 /uL (ref 570–4860)
Lymphocytes Relative: 21.7 % (ref 15.0–45.0)
Monocytes Absolute: 1070 /uL (ref 0–1296)
Monocytes Relative: 10 % (ref 0.0–12.0)
Neutrophils Absolute: 7051 /uL (ref 1520–8640)
Neutrophils Relative: 65.9 % (ref 40.0–80.0)
nRBC: 0 /100{WBCs} (ref 0–0)

## 2024-04-18 LAB — URINALYSIS W/RFL TO MICROSCOPIC
Bilirubin, UA: NEGATIVE
Blood, UA: NEGATIVE
Glucose, UA: NEGATIVE mg/dL
Hyaline Casts, UA: 1 /LPF (ref 0–2)
Ketones, UA: NEGATIVE mg/dL
Leukocyte Esterase, UA: NEGATIVE
Nitrite, UA: NEGATIVE
Protein, UA: NEGATIVE mg/dL
RBC, UA: 2 /HPF (ref 0–3)
Specific Gravity, UA: >1.035 — ABNORMAL HIGH (ref 1.005–1.035)
Squam Epithel, UA: <1 /HPF (ref 0–5)
Trans Epithel, UA: <1 /HPF (ref 0–5)
Urobilinogen, UA: <2 mg/dL (ref 0.2–1.9)
WBC, UA: 3 /HPF (ref 0–5)
pH, UA: 6 (ref 5.0–8.0)

## 2024-04-18 LAB — BASIC METABOLIC PANEL
Anion Gap: 6 mmol/L (ref 3–16)
BUN: 26 mg/dL — ABNORMAL HIGH (ref 7–25)
CO2: 24 mmol/L (ref 21–33)
Calcium: 9 mg/dL (ref 8.6–10.3)
Chloride: 107 mmol/L (ref 98–110)
Creatinine: 1.37 mg/dL — ABNORMAL HIGH (ref 0.60–1.30)
EGFR: 41
Glucose: 89 mg/dL (ref 70–100)
Osmolality, Calculated: 288 mosm/kg (ref 278–305)
Potassium: 5 mmol/L (ref 3.5–5.3)
Sodium: 137 mmol/L (ref 133–146)

## 2024-04-18 LAB — HEPATIC FUNCTION PANEL
ALT: 16 U/L (ref 7–52)
AST: 28 U/L (ref 13–39)
Albumin: 4.2 g/dL (ref 3.5–5.7)
Alkaline Phosphatase: 67 U/L (ref 36–125)
Bilirubin, Direct: 0 mg/dL (ref 0.0–0.4)
Bilirubin, Indirect: 0.5 mg/dL (ref 0.0–1.1)
Total Bilirubin: 0.5 mg/dL (ref 0.0–1.5)
Total Protein: 7.2 g/dL (ref 6.4–8.9)

## 2024-04-18 LAB — CBC
Hematocrit: 43 % (ref 35.0–45.0)
Hemoglobin: 14.1 g/dL (ref 11.7–15.5)
MCH: 30.9 pg (ref 27.0–33.0)
MCHC: 32.9 g/dL (ref 32.0–36.0)
MCV: 93.9 fL (ref 80.0–100.0)
MPV: 9.1 fL (ref 7.5–11.5)
Platelets: 176 10E3/uL (ref 140–400)
RBC: 4.58 10E6/uL (ref 3.80–5.10)
RDW: 13.6 % (ref 11.0–15.0)
WBC: 10.7 10E3/uL (ref 3.8–10.8)

## 2024-04-18 LAB — LIPASE: Lipase: 29 U/L (ref 4–82)

## 2024-04-18 LAB — HIGH SENSITIVITY TROPONIN: High Sensitivity Troponin: 3 ng/L (ref 0–14)

## 2024-04-18 MED ORDER — IOHEXOL 350 MG IODINE/ML INTRAVENOUS SOLUTION
350 | Freq: Once | INTRAVENOUS | Status: AC | PRN
Start: 2024-04-18 — End: 2024-04-18
  Administered 2024-04-18: 15:00:00 via INTRAVENOUS

## 2024-04-18 MED ORDER — ONDANSETRON HCL (PF) 4 MG/2 ML INJECTION SOLUTION
4 | Freq: Once | INTRAMUSCULAR | Status: AC
Start: 2024-04-18 — End: 2024-04-18
  Administered 2024-04-18: 14:00:00 via INTRAVENOUS

## 2024-04-18 MED ORDER — LACTATED RINGERS IV BOLUS
Freq: Once | INTRAVENOUS | Status: AC
Start: 2024-04-18 — End: 2024-04-18
  Administered 2024-04-18: 14:00:00 via INTRAVENOUS

## 2024-04-18 MED FILL — LACTATED RINGERS INTRAVENOUS SOLUTION: 1000.0000 1000.0000 mL | INTRAVENOUS | Qty: 1000 | Fill #0

## 2024-04-18 MED FILL — ONDANSETRON HCL (PF) 4 MG/2 ML INJECTION SOLUTION: 4 4 mg/2 mL | INTRAMUSCULAR | Qty: 2 | Fill #0

## 2024-04-18 NOTE — ED Attestation (Signed)
 ED Attending Attestation Note    Date of service:  04/18/2024    This patient was seen by the resident physician.  I have seen and examined the patient, agree with the workup, evaluation, management and diagnosis. The care plan has been discussed and I concur.  I have reviewed the ECG and concur with the resident's interpretation.    History of Present Illness  Joanna Lopez is a 73 year old female who presents with severe left-sided abdominal pain and recurrent diarrhea.    She initially sought emergency care due to severe left-sided abdominal pain, where a CT scan revealed large liver cysts. She was hospitalized for three days, during which she was advised not to eat and was initially scheduled for surgery. However, the surgery was canceled as her pain subsided with fluid treatment.    Over the past two weeks, she has experienced intermittent episodes of severe left-sided abdominal pain, particularly frequent in the last three to four days, occurring up to four times overnight. The pain is severe but subsides before returning.    In addition to the abdominal pain, she has recurrent diarrhea, described as yellow in color, with no blood present. She has experienced vomiting once and frequent spitting up. She notes a decrease in urination, attributing it to the diarrhea and a recent cold for which she has been taking Nyquil.    Constitutional: Alert, nontoxic-appearing, in no acute distress.  Eyes:  EOM intact, sclera anicteric, conjunctiva normal.    HENT:  Atraumatic, external ears normal, nose normal, oropharynx moist.   Neck: Normal range of motion.   Respiratory:  Normal respiratory effort.  Cardiovascular:  Regular rate.  GI:  Non-distended.  No focal abdominal tenderness.  Musculoskeletal:  No deformities.   Skin:  No rash noted.   Neurologic:  Awake and alert.  Moves all four extremities.    Psychiatric: Behavior appropriate for circumstances.

## 2024-04-18 NOTE — ED Notes (Signed)
 Pt discharged to home. Pt verbalized understanding of discharge instructions. All questions answered. IV removed. Catheter intact. Direct pressure applied until hemostasis.  Ambulated from room with steady gait.

## 2024-04-18 NOTE — ED Provider Notes (Signed)
 [facility] Emergency Department Encounter     Date of evaluation: 04/18/2024    Chief Complaint     Reason for visit: Abdominal Pain    Nursing notes, Past Medical History, Past Surgical History, Social History, Allergies, and Family History were reviewed.    Patient History     HPI: Joanna Lopez is a 73 y.o. female with history of OSA, history of lung cancer, HTN, HLD who presents to the emergency department with nausea and loose stools.  Patient states that she started developing loose stools 4 days ago.  10+ episodes per day with no melena or hematochezia.  Patient denies any recent travel, or recent antibiotic use.  Patient states that this has happened previously in April 2025 and at that time was diagnosed with a hepatic cyst lesion concerning for ruptured cyst.  Patient states that her left upper quadrant abdominal pain feels very similar to this previous episode and therefore came to the ER.  Patient reports nausea without any active vomiting.  Poor oral intake over the past 2 days.  Patient is concerned because she has been feeling lightheaded over the past couple of days, possibly due to not eating.  Denies chest pain.  Mild SOB, not worse with exertion.  No dysuria.    To be noted patient had a normal colonoscopy back in October 2025.  She had a CT scheduled yesterday however was unable to attend given her significant diarrhea.    With the exception of the above, there are no aggravating or alleviating factors.    Medical History:   Past Medical History:   Diagnosis Date    Anxiety     Basal cell cancer     Cataract     Hyperlipidemia     Hypertension     Lung cancer (CMS-HCC)     OSA (obstructive sleep apnea)     RLS (restless legs syndrome)     Stroke (CMS-HCC)     5 yrs ago, TIA X 2       Surgical History:   Past Surgical History:   Procedure Laterality Date    APPENDECTOMY      CATARACT EXTRACTION, BILATERAL Bilateral 07/2021    COLONOSCOPY N/A 01/23/2013     Procedure: COLONOSCOPY WITH MAC;  Surgeon: Rankin Dellen, MD;  Location: [facility] ENDOSCOPY;  Service: Gastroenterology;  Laterality: N/A;    COLONOSCOPY N/A 02/15/2024    Procedure: COLONOSCOPY W/ OR W/O BIOPSY;  Surgeon: [name] CHRISTELLA Beckmann, MD;  Location: [facility] ENDOSCOPY;  Service: Gastroenterology;  Laterality: N/A;    FLEXIBLE BRONCHOSCOPY W/ UPPER ENDOSCOPY N/A 09/17/2016    Procedure: BRONCHOSCOPY SUPER D WITH FLUORO AND BIOPSY-EBUS/NAVI;  Surgeon: Sadia Benzaquen, MD;  Location: [facility] ENDOSCOPY;  Service: Pulmonary;  Laterality: N/A;    INSERTION CATHETER VASCULAR ACCESS N/A 12/26/2021    Procedure: INSERTION POWER PORT A CATH;  Surgeon: Lucendia Blanch, MD;  Location: [facility] OR;  Service: General;  Laterality: N/A;    LOOP IMPLANT IMPLANTATION N/A 04/07/2017    Procedure: Loop  Implantation;  Surgeon: Lue Legions, MD;  Location: [facility] ELECTROPHYSIOLOGY LAB;  Service: Electrophysiology;  Laterality: N/A;    LOOP REMOVAL Left 04/09/2021    Procedure: Loop Removal;  Surgeon: Rory Osier, MD;  Location: [facility];  Service: Electrophysiology;  Laterality: Left;    submastoid gland removal      THORACOSCOPY Right 11/23/2016    Procedure: RIGHT SIDED VATS MIDDLE LOBE LOBECTOMY,HILAR MEDIASTINAL LYMPH NODE DISSECTION;  Surgeon: Nena Humbles,  MD;  Location: [facility] OR;  Service: Thoracic;  Laterality: Right;    THORACOSCOPY LOBECTOMY VIDEO ASSISTED Left 11/18/2021    Procedure: Left Thoracoscopic Lingulectomy;  Surgeon: Nena Humbles, MD;  Location: [facility] OR;  Service: General;  Laterality: Left;    TONSILLECTOMY      UVULOPALATOPHARYNGOPLASTY         Medications: No current facility-administered medications for this encounter.    Current Outpatient Medications:     aspirin  325 MG tablet, Take 1 tablet (325 mg total) by mouth daily., Disp: 30 tablet, Rfl: 0    atorvastatin  (LIPITOR ) 80 MG tablet, Take 1 tablet (80 mg total) by mouth every morning., Disp: , Rfl:     BIOTIN ORAL, Take 1 tablet by mouth daily., Disp: , Rfl:      busPIRone  (BUSPAR ) 10 MG tablet, Take 1 tablet (10 mg total) by mouth in the morning and at bedtime., Disp: , Rfl:     calcium -vitamin D  (OSCAL-500 + D) 500 mg-5 mcg (200 unit) per tablet, Take 1 tablet by mouth daily. (Patient taking differently: Take 1 tablet by mouth daily. As needed), Disp: , Rfl:     gabapentin  (NEURONTIN ) 300 MG capsule, Take 1 capsule (300 mg total) by mouth 3 times a day., Disp: 90 capsule, Rfl: 1    leucovorin-pyridox-mecobalamin (FOLINIC-PLUS) 4-50-2 mg Tab, Take 1 tablet by mouth daily., Disp: , Rfl:     levETIRAcetam  ER (KEPPRA  XR) 500 mg 24 hr tablet, Take 2 tablets (1,000 mg total) by mouth daily., Disp: 180 tablet, Rfl: 1    lipase -protease -amylase  (CREON ) 24,000-76,000 -120,000 unit CpDR, Take 2 capsules by mouth 3 times a day as needed (for abdominal cramping). Take prior to meals, Disp: 60 capsule, Rfl: 3    lisinopriL  (PRINIVIL ) 10 MG tablet, Take 1 tablet (10 mg total) by mouth daily., Disp: , Rfl:     loperamide  (IMODIUM ) 2 mg capsule, Take 1-2 capsules (2-4 mg total) by mouth 4 times a day as needed for Diarrhea. Take 4 mg (2 capsules) after the first loose stool and then 1 capsule for each loose stool thereafter, Disp: , Rfl:     MELATONIN ORAL, Take 10 mg by mouth at bedtime as needed (sleep)., Disp: , Rfl:     multivitamin (THERAGRAN) tablet, Take 1 tablet by mouth daily., Disp: , Rfl:     ondansetron  (ZOFRAN ) 8 MG tablet, Take 1 tablet (8 mg total) by mouth every 8 hours as needed for Nausea., Disp: 30 tablet, Rfl: 3    PARoxetine  (PAXIL ) 20 MG tablet, Take 1 tablet (20 mg total) by mouth at bedtime., Disp: , Rfl:     polyethylene glycol (GOLYTELY ) 236-22.74-6.74 -5.86 gram solution, Please take your prep according to the instructions outlined in your prep letter in messages and communications., Disp: 4000 mL, Rfl: 0    roPINIRole  (REQUIP ) 1 MG tablet, Take 1 tablet (1 mg total) by mouth at bedtime. Indications: Restless Legs Syndrome, Disp: , Rfl:     Social History:  Joanna Lopez  reports that she has never smoked. She has never used smokeless tobacco. She reports that she does not currently use alcohol. She reports that she does not use drugs.    Allergies:   Allergies as of 04/18/2024 - Fully Reviewed 04/18/2024   Allergen Reaction Noted    Penicillins Anaphylaxis 07/21/2012    Adhesive  06/23/2021    Ciprofloxacin  Other (See Comments) 09/17/2023    Formaldehyde analogues and releasers  07/21/2012    Nsaids (non-steroidal  anti-inflammatory drug)  11/10/2021    Promethazine  hcl  04/10/2017    Latex Rash 12/03/2011    Sulfa (sulfonamide antibiotics) Rash 07/21/2012        Review of Systems     Review of Systems   Constitutional:  Negative for chills, fever and weight loss.   HENT:  Negative for congestion, ear pain, sinus pain and sore throat.    Eyes:  Negative for photophobia and discharge.   Respiratory:  Negative for cough, shortness of breath and wheezing.    Cardiovascular:  Negative for chest pain, palpitations and orthopnea.   Gastrointestinal:  Positive for abdominal pain, diarrhea and nausea. Negative for blood in stool, constipation, heartburn, melena and vomiting.   Genitourinary:  Negative for dysuria, frequency and urgency.   Musculoskeletal:  Negative for back pain and neck pain.   Skin:  Negative for itching and rash.   Neurological:  Negative for dizziness, loss of consciousness, weakness and headaches.        Lightheadedness   Endo/Heme/Allergies:  Does not bruise/bleed easily.   Psychiatric/Behavioral:  Negative for depression. The patient is not nervous/anxious.       As stated above, all other systems reviewed and are otherwise negative.     Patient History     Vitals:    04/18/24 0706 04/18/24 0808 04/18/24 0812 04/18/24 0921   BP: 92/68 106/69 106/69 103/57   BP Location: Right upper arm      Patient Position: Sitting      BP Cuff Size: Regular      Pulse: 103  93 75   Resp: 18  18    Temp: 97.3 F (36.3 C)      TempSrc: Temporal      SpO2: 97%   94% 97%   Weight:       Height:           Physical Exam  Constitutional:       Appearance: Normal appearance.   HENT:      Head: Normocephalic and atraumatic.      Nose: Nose normal.      Mouth/Throat:      Mouth: Mucous membranes are moist.   Eyes:      Extraocular Movements: Extraocular movements intact.      Pupils: Pupils are equal, round, and reactive to light.   Cardiovascular:      Rate and Rhythm: Normal rate and regular rhythm.      Pulses: Normal pulses.      Heart sounds: Normal heart sounds.   Pulmonary:      Effort: Pulmonary effort is normal. No respiratory distress.      Breath sounds: Normal breath sounds. No stridor. No wheezing, rhonchi or rales.   Abdominal:      General: Abdomen is flat. There is no distension.      Palpations: Abdomen is soft.      Tenderness: There is no abdominal tenderness. There is no right CVA tenderness, left CVA tenderness or guarding.   Musculoskeletal:         General: Normal range of motion.      Cervical back: Normal range of motion and neck supple.      Right lower leg: No edema.      Left lower leg: No edema.   Skin:     General: Skin is warm.   Neurological:      General: No focal deficit present.      Mental Status: She is alert and  oriented to person, place, and time.   Psychiatric:         Mood and Affect: Mood normal.         Behavior: Behavior normal.         Diagnostic Studies     Labs: Please see electronic medical record for any tests performed in the ED   Labs Reviewed   BASIC METABOLIC PANEL - Abnormal; Notable for the following components:       Result Value    BUN 26 (*)     Creatinine 1.37 (*)     All other components within normal limits   URINALYSIS W/RFL TO MICROSCOPIC - Abnormal; Notable for the following components:    Specific Gravity, UA >1.035 (*)     Bacteria, UA Rare (*)     Mucus, UA Present (*)     All other components within normal limits    Narrative:     Microscopic testing is not performed when the dipstick is negative for blood,  leukocyte, protein and nitrite.   CBC   DIFFERENTIAL   HEPATIC FUNCTION PANEL   LIPASE    HIGH SENSITIVITY TROPONIN       IMAGING STUDIES / RADIOLOGY: Please see electronic medical record for any tests performed in the ED  CT Pulmonary Angiography   Final Result   IMPRESSION:      1.  No acute pulmonary embolism.   2.  Postsurgical changes of right middle lobectomy and lingulectomy without evidence of recurrent or metastatic disease within chest.            Approved by Anam Hussain, MD on 04/18/2024 10:29 AM EST      I have personally reviewed the images and I agree with this report.      Report Verified by: Danial Drone, MD at 04/18/2024 10:39 AM EST      CT Abdomen and Pelvis With IV contrast   Final Result   IMPRESSION:      No CT evidence of acute abnormality in the abdomen and pelvis.      One of the larger cysts seen in the left hepatic lobe on the prior CT is no longer present, reflecting interval rupture.      Colonic diverticulosis.         Report Verified by: MARLA Darin Sheer, MD at 04/18/2024 10:42 AM EST      X-ray Chest PA and Lateral   Final Result   IMPRESSION:    No acute cardiopulmonary abnormality. Bilateral lung surgery.      Report Verified by: Dorn Reeve, MD at 04/18/2024 8:31 AM EST          EKG:  Seen and Interpreted by myself and the attending physician, Dr. Glyn  Indication: upper abdominal pain  Finding: nsr  No evidence for acute ischemia or ST segment changes compared to 08/27/2023  Ventricular Rate: 81 bpm  PR interval: 146 ms  QRS Duration: 72 ms  QT/QTc: 400/464 ms  ST Segments: no elevation or depression  T waves: no inversions    ED Procedures     Procedures  None    ED Course     ED Medications Administered:  Medications   ondansetron  (ZOFRAN ) injection 4 mg (4 mg Intravenous Given 04/18/24 0832)   lactated ringers  IV bolus 1,000 mL (0 mLs Intravenous Stopped 04/18/24 0931)   OMNIPAQUE  (iohexol ) 350 mg iodine /mL 125 mL (125 mLs Intravenous Given 04/18/24 0937)       CONSULTS: None  MDM     Vitals:    04/18/24 9293 04/18/24 0808 04/18/24 0812 04/18/24 0921   BP: 92/68 106/69 106/69 103/57   BP Location: Right upper arm      Patient Position: Sitting      BP Cuff Size: Regular      Pulse: 103  93 75   Resp: 18  18    Temp: 97.3 F (36.3 C)      TempSrc: Temporal      SpO2: 97%  94% 97%   Weight:       Height:           Joanna Lopez is a 73 y.o. female with history of OSA, history of lung cancer, HTN, HLD who presents to the emergency department with nausea and loose stools.  Patient states that she started developing loose stools 4 days ago.  10+ episodes per day with no melena or hematochezia.  Patient denies any recent travel, or recent antibiotic use.  Patient states that this has happened previously in April 2025 and at that time was diagnosed with a hepatic cyst lesion concerning for ruptured cyst.  Patient states that her left upper quadrant abdominal pain feels very similar to this previous episode and therefore came to the ER.  Patient reports nausea without any active vomiting.  Poor oral intake over the past 2 days.  Patient is concerned because she has been feeling lightheaded over the past couple of days, possibly due to not eating.  Denies chest pain.  Mild SOB, not worse with exertion.  No dysuria.    To be noted patient had a normal colonoscopy back in October 2025.  She had a CT scheduled yesterday however was unable to attend given her significant diarrhea.    On physical exam, patient is an overall well-appearing 73 year old female in no acute distress.  Blood pressures are on the softer side of 106/69, on lisinopril  for her BP.  Her abdomen is soft, nondistended with no focal guarding rigidity or rebound.  She appears nontoxic.  Lungs clear to auscultation    Given patient's upper abdominal pain we will also obtain cardiac workup.  Will plan for CT scan for further evaluation management given her history of hepatic cystic lesion.    Workup is reassuring.  No  leukocytosis.  Creatinine is a little elevated.  At baseline at 1.37.  However not true AKI.  CT abdomen is showing:  IMPRESSION:     No CT evidence of acute abnormality in the abdomen and pelvis.     One of the larger cysts seen in the left hepatic lobe on the prior CT is no longer present, reflecting interval rupture.     Colonic diverticulosis.     I do not suspect that this was a recent interval rupture requiring further evaluation.  Patient feels significantly better on reevaluation and able to tolerate p.o. and is asking for Coke at lunch.  Urinalysis also unremarkable.  Reassuring troponins EKG and CXR, low suspicion that this is ACS/cardiac.      I do not believe that this patient requires any further work-up or inpatient management for their complaints, and are appropriate for outpatient follow-up.  I discussed the findings of my physical exam and work-up with the patient, and they were given the opportunity to ask questions.  The patient is agreeable with the plan and is ready to be discharged.    The patient was discharged home  Patient instructed to follow-up with PCP  as soon as possible, and given strict return precautions.      The patient was evaluated by myself and the ED Attending Physician, Dr. Glyn All management and disposition plans were discussed and agreed upon.    Medical Decision Making  Problems Addressed:  Abdominal pain, unspecified abdominal location: complicated acute illness or injury    Amount and/or Complexity of Data Reviewed  Labs: ordered. Decision-making details documented in ED Course.  Radiology: ordered. Decision-making details documented in ED Course.  ECG/medicine tests: ordered. Decision-making details documented in ED Course.    Risk  Prescription drug management.          This note was dictated using voice-recognition software, which occasionally leads to inadvertent typographic errors.    Clinical Impression     1. Abdominal pain, unspecified abdominal location         Disposition     Discharged from the ED. See AVS for prescriptions, followup, and discharge instructions.     DISCHARGE MEDICATIONS:   New Prescriptions    No medications on file       PATIENT REFERRED TO:   Azzie Reagin, MD  [address]  [address]  [place] [address]  [phone]          [facility] Emergency Department  [address]  [place]  [address]  [phone]    As needed, If symptoms worsen      COOPER PALING, PA, PA-C  Department of Emergency Medicine     Cooper Paling, [place]  04/18/24 1132

## 2024-04-18 NOTE — Discharge Instructions (Signed)
-   You were seen in the emergency department due to abdominal pain and loose stools over the past couple of days.  We did complete a CT scan which was reassuring.  You do not have any significant abnormalities in your blood work however noted that your creatinine which is your kidney function is a little elevated compared to your baseline.  Make sure you are adequately hydrating.  -Follow-up with your primary care doctor in the near future.  Return to the ER with concerning symptoms such as fever, chills, chest pain, shortness of breath or other concerning symptoms.-

## 2024-04-18 NOTE — ED Notes (Signed)
 Attempted to given urine sample, but was mixed with stool  Pt states she just keeps having diarrhea

## 2024-04-18 NOTE — ED Triage Notes (Signed)
 C/O Left sided abdominal pain over the past couple of weeks. Endorses nausea/vomiting/diarrhea.

## 2024-04-21 ENCOUNTER — Ambulatory Visit: Payer: MEDICARE

## 2024-04-24 ENCOUNTER — Ambulatory Visit: Payer: MEDICARE | Attending: Surgical Oncology

## 2024-04-25 NOTE — ADT Alerts (Signed)
 This is a notification of a Admission Alert generated from an ADT received from [employer]. This patient was admited un:[facility] Admit Date:04/25/2024 0154 P.M. Discharge Date: Visit Type:EMERGENCY Diagnosis:Abdominal Pain

## 2024-04-25 NOTE — ADT Alerts (Signed)
 This is a notification of a Discharge Alert generated from an ADT received from [employer]. This patient was admited un:[facility] Admit Date:04/25/2024 0154 P.M. Discharge Date:04/29/2024 0112 P.M. Visit Type:INPATIENT Diagnosis:Nausea   Generalized abdominal pain Acute kidney failure, unspecified Abnormal levels of other serum enzymes Generalized abdominal pain Nausea Acute kidney failure, unspecified Abnormal levels of other serum enzymes Abdominal Pain Nausea Generalized abdominal   pain Acute kidney failure, unspecified Abnormal levels of other serum enzymes

## 2024-04-25 NOTE — Telephone Encounter (Signed)
 Spoke with patient, she was at [facility] ED on 04/18/24, states she was told the cysts in my liver are bursting and evacuating and that's why I'm in so much pain.  Also reports nausea and diarrhea, no vomiting.  Reports poor appetite.  Patient  does see both Asberry Shawl CNP for GI and Vernell Herring CNP for liver.  Spoke with Greig Skeeter RN for Vernell Herring CNP and decision was made to schedule follow-up appt for patient to see both providers on same day-05/17/24 at 11:30am with Asberry Shawl CNP and 12:40pm with Vernell Herring CNP, spoke with patient and she was agreeable.

## 2024-04-25 NOTE — Telephone Encounter (Signed)
 Pt called and is requesting sooner HDF appointment.     First available appt is 12/20/2024.    Pt states she was seen in the hospital on 04/18/2024 and would like to be seen sooner as she is still experiencing issues.     Please return call to [phone].

## 2024-05-01 NOTE — Research Note (Signed)
 Subject ID: [id number]   Protocol Number: [id number]   Treatment Group: NA  Follow-Up Visit Time Point: Follow up 4  Treating MD: Dr. Jacqui. Beather     1. Was the patient seen in clinic by a provider within the protocol follow-up visit window?: Yes   If yes, which clinic visit (including provider name and date of visit) is being utilized for the follow-up visit? ED visit on 09Dec2025 and 16Dec2025    2. Was the patient contacted by phone by the research team for the follow-up visit?: No   If yes, what was the date that research contacted the patient via phone call? N/A    3. Were adverse events or conmeds required at this visit? NA    4. Were questionnaires required at this visit?: NA   If yes, were they collected?: NA   Were questionnaires obtained over the phone or in person? N/A    5. Were disease assessments due at this visit?: No  Type of Assessment:N/A  Date of Assessment: N/A     6. Has the patient had a disease recurrence since the last follow-up visit? NA   If yes, what was the date of the confirmed recurrence? N/A   If yes, does this require expedited reporting per protocol? NA    7. Has the patient had a new, second primary cancer diagnosis since the last follow-up visit? NA   If yes, what was the date of the confirmed second primary? N/A   If yes, does this require expedited reporting per protocol? NA    Narrative Note:    OBC reviewed chart notes. No new visits with oncologist since last follow up in June, but was seen at the ED on 09Dec2025 and at a external ED visit on 16Dec2025. Used these two notes to document survival status and any new cancer treatments.     Next Follow-Up Visit Time Point: Follow up 5  Next Follow-Up Visit Due Date (including window range): 16Jun2026 (Every 6 months for 5 years)     Joanna Lopez [facility]

## 2024-05-17 ENCOUNTER — Inpatient Hospital Stay: Admit: 2024-05-17 | Payer: MEDICARE | Attending: Acute Care

## 2024-05-17 ENCOUNTER — Ambulatory Visit: Payer: MEDICARE | Attending: Family

## 2024-05-17 ENCOUNTER — Ambulatory Visit: Admit: 2024-05-17 | Discharge: 2024-05-21 | Payer: MEDICARE | Attending: Family

## 2024-05-17 ENCOUNTER — Ambulatory Visit: Admit: 2024-05-17 | Discharge: 2024-06-01 | Payer: MEDICARE | Attending: Acute Care

## 2024-05-17 DIAGNOSIS — K8681 Exocrine pancreatic insufficiency: Principal | ICD-10-CM

## 2024-05-17 DIAGNOSIS — K7689 Other specified diseases of liver: Principal | ICD-10-CM

## 2024-05-17 MED ORDER — DICYCLOMINE 10 MG CAPSULE
10 | ORAL_CAPSULE | Freq: Three times a day (TID) | ORAL | 0 refills | 30.00000 days | Status: AC | PRN
Start: 2024-05-17 — End: ?

## 2024-05-17 MED ORDER — ONDANSETRON HCL 8 MG TABLET
8 | ORAL_TABLET | Freq: Three times a day (TID) | ORAL | 3 refills | 7.00000 days | Status: AC | PRN
Start: 2024-05-17 — End: ?

## 2024-05-17 MED ORDER — CREON 24,000-76,000-120,000 UNIT CAPSULE,DELAYED RELEASE
24000-76000 | ORAL_CAPSULE | Freq: Three times a day (TID) | ORAL | 6 refills | 32.00000 days | Status: AC | PRN
Start: 2024-05-17 — End: ?

## 2024-05-17 NOTE — Progress Notes (Signed)
 No chief complaint on file.       History of Present Illness:  Joanna Lopez is a 74 y.o. female with multilobulated or cystic lesions of the liver on imaging who returns for follow-up visit.  Last seen August 2025.    PMH lung cancer (s/p VATS, chemo) 2023, HTN, CKD stage III, pancreatic insufficiency, stroke, seizures    Patient was  admitted to [facility] mid April 2025 for complaints of new and worsening left upper quadrant pain over the prior 6 weeks.  With leukocytosis, AKI.  At that time she had a CT abdomen that showed mild inflammatory changes adjacent to multilobulated septated left hepatic cystic lesion that were nonspecific and possibly representative of infection or inflammation versus recent rupture.  She was treated with antibiotics (Rocephin , Flagyl ) and discharged on Cipro /Flagyl  for 5 days.   Interventional radiology recommended outpatient follow-up and possible drainage if indicated.  Repeat CT abdomen showed  a possible evolving abscess.  She was seen by infectious disease (Dr. Wendee) and antibiotics were restarted (cefdinir  and Flagyl ), she had severe pain in her heels with Cipro . She saw Dr. Leni Meter (surgery) who recommended observation.      Diarrhea - s/p colonoscopy 02/15/2024 showed diverticulosis and was otherwise normal.  Recommendation to repeat in 5 years.  Following with Asberry Shawl, CNP.      She was seen in the ED again in early December 2025 with complaints of nausea and loose stools x 4 days.  She was having left upper quadrant pain similar to the prior episode noted above.  She had a CT in the ED showing no evidence of abnormality in the abdomen and pelvis.  One of the larger cyst seen in the left hepatic lobe on the prior CT is no longer present reflecting interval rupture.  Serologic workup showed no leukocytosis.  AST was mildly elevated at 28, ALT 60.     On today's visit She reports  is having multiple (up to 6) semi-formed bowel movements daily. Has had  2 episodes of intense upper abdominal pain accompanied by diaphoresis , more often upper left abdomen that self-resolves approximately 5 minutes. Takes creon . Stopped taking bentyl  as she did not think it was helping. Also has nausea when she doesn't eat relieved by zofran  . Understood from the ED doctor that she has bursting and expelling of her cysts. She saw Asberry Shawl CNP today for the diarrhea, nausea, abdominal pain. She has a sister who is listed for liver/kidney transplant,but does not know the cause of the liver disease. Planning r/o pancreatitis, constipation, IBS, possible EUS. Denies fevers, chills, edema, hematochezia, melena, hematemesis    Testing:   -CT abdomen and pelvis 04/18/2024: No CT evidence of acute abnormality in the abdomen and pelvis.  One of the larger cyst seen in the left hepatic lobe on the prior CT is no longer present,  Reflecting interval rupture  -Colonoscopy 02/15/2024 diverticulosis, otherwise normal, no specimens collected, repeat in 5 years  -CT abdomen and pelvis 08/25/2023: Multiple hepatic cystic lesions including multilobulated lesion in the left hepatic lobe measuring 5 x 7.4 cm.  This lesion is slightly increased from 3.7 x 6.7 cm on 12/29/2016.  There are suspected internal septations without discrete enhancing nodule identified.  There is adjacent fat stranding to this lesion, new from prior.  Additional scattered subcentimeter low-density lesions in the liver too small to characterize but are favored to represent small cysts or hemangiomas.  Nonspecific and may represent infection or inflammation  of the cystic lesion versus recent rupture.  -CT abdomen and pelvis 09/13/2023: Smaller size of multiseptated cystic lesion in segment 3 of the liver with decrease of the adjacent inflammatory change.  There is interval development of a new thick-walled cystic lesion medially in segment 3 which may represent an abscess.    Wt Readings from Last 3 Encounters:   05/17/24 182 lb  (82.6 kg)   05/17/24 182 lb (82.6 kg)   04/18/24 185 lb (83.9 kg)          Review of Systems   The following portions of the patient history were reviewed and updated as appropriate: allergies, current medications, family, medical, surgical and social history and problem list.    A comprehensive Review of Systems was completed and was negative other than what has been noted in the HPI.    Past Medical History:  She has a past medical history of Anxiety, Basal cell cancer, Cataract, Hyperlipidemia, Hypertension, Lung cancer (CMS-HCC), OSA (obstructive sleep apnea), RLS (restless legs syndrome), and Stroke (CMS-HCC).    Medications:  [Current Medications]    [Current Medications]    Current Outpatient Medications:     aspirin  325 MG tablet, Take 1 tablet (325 mg total) by mouth daily., Disp: 30 tablet, Rfl: 0    atorvastatin  (LIPITOR ) 80 MG tablet, Take 1 tablet (80 mg total) by mouth every morning., Disp: , Rfl:     BIOTIN ORAL, Take 1 tablet by mouth daily., Disp: , Rfl:     busPIRone  (BUSPAR ) 10 MG tablet, Take 1 tablet (10 mg total) by mouth in the morning and at bedtime., Disp: , Rfl:     calcium -vitamin D  (OSCAL-500 + D) 500 mg-5 mcg (200 unit) per tablet, Take 1 tablet by mouth daily., Disp: , Rfl:     gabapentin  (NEURONTIN ) 300 MG capsule, Take 1 capsule (300 mg total) by mouth 3 times a day., Disp: 90 capsule, Rfl: 1    leucovorin-pyridox-mecobalamin (FOLINIC-PLUS) 4-50-2 mg Tab, Take 1 tablet by mouth daily., Disp: , Rfl:     lipase /protease /amylase  (CREON  ORAL), Take 1 capsule by mouth 3 times a day as needed (for abdominal cramping). Take prior to meals, Disp: , Rfl:     lisinopriL  (PRINIVIL ) 10 MG tablet, Take 1 tablet (10 mg total) by mouth daily., Disp: , Rfl:     loperamide  (IMODIUM ) 2 mg capsule, Take 1-2 capsules (2-4 mg total) by mouth 4 times a day as needed for Diarrhea. Take 4 mg (2 capsules) after the first loose stool and then 1 capsule for each loose stool thereafter, Disp: , Rfl:      MELATONIN ORAL, Take 10 mg by mouth at bedtime as needed (sleep)., Disp: , Rfl:     multivitamin (THERAGRAN) tablet, Take 1 tablet by mouth daily., Disp: , Rfl:     ondansetron  (ZOFRAN ) 8 MG tablet, Take 1 tablet (8 mg total) by mouth every 8 hours as needed for Nausea., Disp: 30 tablet, Rfl: 3    PARoxetine  (PAXIL ) 20 MG tablet, Take 1 tablet (20 mg total) by mouth at bedtime., Disp: , Rfl:     polyethylene glycol (MIRALAX ) 17 gram/dose powder, Take 17 g by mouth daily as needed (constipation)., Disp: , Rfl:     roPINIRole  (REQUIP ) 1 MG tablet, Take 1 tablet (1 mg total) by mouth at bedtime. Indications: Restless Legs Syndrome, Disp: , Rfl:       Allergies:  Penicillins, Adhesive, Ciprofloxacin , Formaldehyde analogues and releasers, Nsaids (non-steroidal anti-inflammatory drug), Promethazine  hcl,  Latex, and Sulfa (sulfonamide antibiotics)    Family History:  Her family history includes Diabetes in her son. She was adopted.    Past Surgical History:  She has a past surgical history that includes Appendectomy; submastoid gland removal; Uvulopalatopharyngoplasty; Tonsillectomy; Colonoscopy (N/A, 01/23/2013); Flexible bronchoscopy w/ upper endoscopy (N/A, 09/17/2016); Thoracoscopy (Right, 11/23/2016); Loop Implant  Implantation (N/A, 04/07/2017); Loop Removal (Left, 04/09/2021); Cataract extraction, bilateral (Bilateral, 07/2021); thoracoscopy lobectomy video assisted (Left, 11/18/2021); insertion catheter vascular access (N/A, 12/26/2021); and Colonoscopy (N/A, 02/15/2024).    Social History:  She reports that she has never smoked. She has never used smokeless tobacco. She reports that she does not currently use alcohol. She reports that she does not use drugs.    The following portions of the patient's history were reviewed and updated as appropriate: allergies, current medications, past family history, past medical history, past social history, past surgical history, and problem list.    Review of  Systems:  ROS    Vital Signs:  There were no vitals taken for this visit.  Physical Exam  Vitals reviewed.   Constitutional:       General: She is not in acute distress.     Appearance: Normal appearance. She is well-developed. She is obese. She is not diaphoretic.   HENT:      Head: Normocephalic and atraumatic.   Eyes:      General: No scleral icterus.        Right eye: No discharge.         Left eye: No discharge.      Conjunctiva/sclera: Conjunctivae normal.   Neck:      Trachea: No tracheal deviation.   Cardiovascular:      Rate and Rhythm: Normal rate and regular rhythm.      Heart sounds: Normal heart sounds.   Pulmonary:      Effort: Pulmonary effort is normal.      Breath sounds: Normal breath sounds.   Abdominal:      General: Abdomen is flat. Bowel sounds are normal. There is no distension.      Palpations: Abdomen is soft.      Tenderness: There is no abdominal tenderness.      Comments: No dullness to flanks. Liver edge palpable on inspiration. Mild tenderness LUQ with deep palpation.   Musculoskeletal:      Right lower leg: No edema.      Left lower leg: No edema.   Lymphadenopathy:      Cervical: No cervical adenopathy.   Skin:     General: Skin is warm and dry.      Comments: No palmar erythema, jaundice or spider angiomata.    Neurological:      Mental Status: She is alert and oriented to person, place, and time.      Comments: No asterixis or tremors.    Psychiatric:         Behavior: Behavior normal.         Thought Content: Thought content normal.       Labs and imaging reviewed    Review of Lab Results:  Lab Results   Component Value Date    WBC 10.7 04/18/2024    HGB 14.1 04/18/2024    HGB 12.1 11/18/2021    HCT 43.0 04/18/2024    HCT 40.5 11/23/2016    MCV 93.9 04/18/2024    PLT 176 04/18/2024    CREATININE 1.37 (H) 04/18/2024    BUN 26 (H) 04/18/2024  NA 137 04/18/2024    K 5.0 04/18/2024    CL 107 04/18/2024    CO2 24 04/18/2024    ALT 16 04/18/2024    AST 28 04/18/2024    ALKPHOS 67  04/18/2024    BILITOT 0.5 04/18/2024       Prior Diagnostic Testing:   Colonoscopy 02/15/2024  Impression:              - Diverticulosis in the sigmoid colon and in the                            transverse colon.                            - The examination was otherwise normal.                            - No specimens collected.   Recommendation:          - Discharge patient to home.                            - Resume previous diet.                            - Continue present medications.                            - Repeat colonoscopy in 5 years for screening                            purposes. To be discussed with referring.                            - Return to referring provider as previously                            scheduled.                            - The findings and recommendations were discussed                            with the patient and their family.     CT A/P 04/18/2024  IMPRESSION:     No CT evidence of acute abnormality in the abdomen and pelvis.     One of the larger cysts seen in the left hepatic lobe on the prior CT is no longer present, reflecting interval rupture.     Colonic diverticulosis.       CT: Abdomen and pelvis 08/25/2023  ABDOMEN AND PELVIS   Mild inflammatory changes adjacent to a multilobulated septated left hepatic cystic lesion; nonspecific and may represent infection or inflammation of the cystic lesion versus recent rupture.     CT abdomen and pelvis 09/13/2023  Impression   IMPRESSION:  Smaller size of the multiseptated cystic lesion in segment 3 of the liver with decrease of the adjacent inflammatory change. There is interval development of a new thick-walled cystic lesion medially in segment 3  which may represent abscess.          Assessment & Plan:  Joanna Lopez is a 74 y.o. female with multilobulated or cystic lesions of the liver on imaging who returns for follow-up visit.  Last seen August 2025.    Liver cysts: normal liver tests. Multiple cystic lesions  in the liver. Stable on recent CT. One larger cyst no longer visible. Check liver panel and get fibroscan.   Intermittent abdominal pain: complete testing as ordered. Less likely liver related. Fibroscan, check liver panel.  Diarrhea: follow up with Asberry Shawl CNP  and complete ordered testing.   Labs as ordered.  Follow up 2 months or sooner if problems.       Medical Decision Making:  The following items were considered in medical decision making:  Permanent chart problem/surgery list reviewed  Permanent chart chronic medication/allergy list reviewed  Permanent chart social/family history reviewed  Review/order clinical lab tests  Review/order radiology tests  Review/order other diagnostic or treatment interventions  Rev iew old records    This note was completely edited, written and reviewed by me and consists of information cut and pasted from the my most recent visit, my smart phrases and other Epic tools. I have personally reviewed all aspects of this note to at least include reviewing this patient's chart and problem list, updating the history, physical exam, lab and procedure results, and assessment and plan as detailed above and below.  As such this visit note reflects my current evaluation and management for this patient.    I spent 13 minutes face to face with this patient with greater than 50% of this time spent in counseling and coordination of care discussing items in the Assessment and Plan above. I also spent 27 minutes on the same day as the encounter performing additional activities related to this service. This time excludes time spent on any separately billed services.

## 2024-05-17 NOTE — Patient Instructions (Signed)
 Follow up in 2 months  Fibroscan to be scheduled. Please fast 3 hours prior.  Labs as ordered.

## 2024-05-17 NOTE — Progress Notes (Signed)
 GASTROENTEROLOGY OFFICE VISIT    Joanna Lopez is 74 y.o. female presents for EPV- IPMN, referred per Vernell Herring      PMH: GERD, lung nodule with hx malignant neoplasm S/p VATS and chemo, acute respiratory insufficiency, HTN, CVA, liver lesion, anxiety, basal cell carcinoma, hLD, HTN, OSA, RLS  PSH: thorarcoscopy lobectomy, cataract extraction, loop implant with removal, bronchoscopy, colonoscopy, appendectomy, tonsillectomy, uvulopalatopharyngoplasty   Fam Hx: diabetes  Social: no history of alcohol or tobacco use on chart    Interval hx (05/11/2024)    Presents in follow up for IPMN, EPI  Recent admission to ER for abdominal pain- imaging concerning for possible rupture of hepatic cyst  States that she was having significant pain and was told that she had a bursting cyst and was sent home  She was recently admitted for 6 days at [facility] - per chart review she was there for nausea/vomiting w/ AKI, diarrhea  She was treated for suspected infectious diarrhea- Antibiotics x 1 week, before Augmentin and Flagyl  which she finished  States that her palms and feet have been itching  Denies jaundice    She denies any heavy alcohol use    Sister with history of liver and kidney disease, unknown type - currently on the transplant list  Grandfather also with liver disease, unknown type - died in his 52's    States that currently she is feeling well  She has occasional intermittent abdominal pain  She wears depends due to episodes of diarrhea   She reports episodes called evacuating or Bursting with significant pain followed by gelatinous/loose stools - she has had two episodes since she went to the hospital  Denies hematochezia or melena, states she will sometimes have brown urine  States she had one after she was discharged from [facility] 12/23, states she spent the night on the toilet - this was the most recent episode  Otherwise she will have a bowel movement three to four times per day, stools  are loose  She reports significant nausea and fatigue  She has a poor appetite but makes herself eat otherwise the nausea gets worse  She will take zofran  for nausea which does help  She is taking two creon  capsules three times per with meals  States that she had lost a little weight - 20 lbs since she was in the hospital after her bursting episode  Drinking tons of water , minimal caffeine  Denies use of new medications  Denies heart burn or indigestion  Denies shortness of breath, cough, fevers or chills      Wt Readings from Last 3 Encounters:   05/17/24 182 lb (82.6 kg)   04/18/24 185 lb (83.9 kg)   02/15/24 181 lb (82.1 kg)         Interval hx (11/29/2023)     Patient presents for 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   IR was consulted for evaluation for drainage who recommended monitoring and antibiotics     Patient reports she has had a history of chronic diarrhea- previously following with Dr. Adele  States that she had issues with diarrhea until she was stung by a wasp  and got cellulitis which was treated with Keflex which improved the diarrhea  She had another cellulitis infection   Reports significant urgency and nocturnal episodes and occasional incontinence issues  She has a bowel movement every two to three days because she uses imodium  regularly- she takes two tablets per day and if she has an episode of diarrhea she will take another  Denies melena or hematochezia  Denies abdominal pain  Denies nausea, vomiting, heart burn or indigestion  No changes to appetite or weight loss- states that she did lose some weight due to cancer  Takes a full  dose ASA daily, no anticoagulation or NSAIDs  Occasional alcohol, no tobacco products  Recently on antibiotics for hepatic cysts, no other new medications or narcotics  Drinks plenty of water , minimal caffeine- she does drink a few sodas a weeks  Last colonoscopy in 2014, EGD in 2013  Denies shortness of breath, cough, fevers or chills  She is still taking creon - but is currently out of it        Previously following with Dr. Adele for chronic diarrhea, last seen in 2015. 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   Had EGD, SBBx's, colonoscopy at [facility] and recent 9/14 colon biopsies all nondiagnostic. OSH noncontrast CT scan was normal.   Continued to have about 3-7x/day, and worse after dairy. Described foul odor to BMs and significant gassiness but not losing weight.   Also mentions that she needs to avoid beans, onions and cabbage.   On bentyl  20mg  po bid, she had some formed BMs, but still quite frequent. No response previously to probiotic.   Symptoms got worse after trip to [place] 89yrs ago.   No abdominal pains. No weight loss. Still has GB. Never smoker; modest alcohol.    fecal fat was normal but fecal elastase a bit low.   We stopped bentyl  and started 2 creons during meals.   Diarrhea down to 3-5x/d, consistency of thick paste, and appetite down.  OSH CT from 3/14 which was chest CT brought by pt which showed some liver cysts and pancreatic tail looked normal but Dr. Adele didn't see much of the intestine or right side of the pancreas.  We ordered CT abd to evaluate further and recommended holding lipitor /zocor incase these were causing symptoms.   CT was non-diagnostic. Serologic testing to evaluate for neuroendocrine causes which were normal.   She felt diarrhea improved slightly with holding lipitor  but was still having 3 loose stools a day (down from 5).  We discussed starting levsin  bid.   Here today for f/u.   Reports improvement of diarrhea with  levsin  2-3 x day in addition to the Creon  and has been having 1-3 soft/loose Bm's a day.   No orange, oily, melena or bloody stools.  About a month ago began to have some nausea and decreased apeptite and weight has been dropping.   A few weeks ago began to have some nausea and then had an episode of severe abd pain left side radiating to mid abd with n/v and diarrhea when she was out of town.   Now nauseated all the time and worse after eating and gets left sided pains after eating lasting a few minutes and then slowly resolves. Pain can occur unrelated to eating also.   She isn't sure if the levsin  helps with the pain.   Bm's have no effect.  Increased amt of dry heaving.   No  heartburn, regurgitation or dysphagia.  Using Creon  1-2 pills with meals which she takes during the meal, feels like it helped her bloating/gassines.  No known fevers, but tells me she gets hot flashes which isn't new for her.   Weight down 8 lbs.   Started on paxil  for anxiety  2-3 weeks ago.           Procedure history:    Colonoscopy 02/15/2024:  Impression:              - Diverticulosis in the sigmoid colon and in the                            transverse colon.                           - The examination was otherwise normal.                           - No specimens collected.  Recommendation:          - Discharge patient to home.                           - Resume previous diet.                           - Continue present medications.                           - Repeat colonoscopy in 5 years for screening                            purposes. To be discussed with referring.                           - Return to referring provider as previously                            scheduled.                           - The findings and recommendations were discussed                            with the patient and their family.    EGD/Colonoscopy 11/2011:  DESCRIPTION OF PROCEDURE:    The Olympus GIF videoendoscope was used. The endoscope was  advanced without difficulty  into the esophagus. The esophagus showed some mild distal esophagitis. The stomach was  entered. The fundus was normal. The body and antrum showed some mild diffuse gastritis.  Pylorus was patent. The duodenal bulb showed some mild inflammatory change with some  erythema, but no ulceration. The distal duodenum appeared normal. A biopsy was obtained  of the distal duodenum to assess for atrophy or other changes. The endoscope was then  withdrawn. A biopsy was obtained of the antrum to assess for Helicobacter. The  endoscope was then withdrawn and retroflexed. Proximal stomach showed moderate sized  hiatal hernia. The endoscope was then withdrawn.    Colonoscopy was then carried out using Olympus  CF videocolonoscope. Colonoscope was  advanced to the cecum in a well prepped. Cecum was identified by the appendiceal orifice  and the ileocecal valve. The mucosa in the cecum was normal. The ascending colon  appeared normal. Transverse colon was normal. The descending colon and sigmoid colon  showed extensive diverticular disease. In the distal sigmoid colon, there was a small 1  cm polyp that was snared and removed with hot polypectomy snare. Further withdrawal  revealed some diverticulosis beyond this, but was otherwise unremarkable.    ASSESSMENT:    1. Gastritis.  2. Esophagitis.  3. Colon polyp.  4. Diverticulosis.      Final Pathologic Diagnosis        A.          Duodenum, biopsy:        -     Small intestinal mucosa with no diagnostic abnormality.      -     The villous architecture is preserved.        -     No inflammatory lesions are identified, including increased  intraepithelial lymphocytes.        -     No evidence of dysplasia or malignancy.    B.          Stomach, antrum, biopsy:         -     Chronic active gastritis.    -     H. pylori are identified on an immunostained slide.    -     No evidence of intestinal metaplasia, dysplasia, or malignancy.    C.          Colon,  sigmoid, biopsy:          -     Polypoid fragment of colonic mucosa with no diagnostic abnormality.    -     No inflammatory lesions are identified.    -     No evidence of dysplasia or malignancy.           Colonoscopy: (2014 Dr. Adele 2/2 chronic diarrhea)  IMPRESSION:  Normal colonoscopy.  Random colon biopsies are pending at this  time.  If these biopsies are normal, we would do some stool testing to  further evaluate the cause of her diarrhea.  FINAL DIAGNOSIS:   Colon, random biopsy:   - Colonic mucosa with focal edema and hemorrhage.   - No evidence of microscopic colitis, active inflammation or chronic glandular injury.      Testing history:    CT A/P 04/18/2024:  FINDINGS:    Lower chest: No evidence of acute abnormality.    Liver: Multiple appendix cysts of varying size. A 4.5 cm cyst lying just medial of the falciform ligament on the prior exam is no longer evident. Remaining cysts are unchanged. Normal background enhancement of the liver parenchyma.    Biliary tree/Gallbladder: Normal gallbladder. No biliary ductal dilation.    Spleen: Normal in appearance.    Pancreas: Normal in appearance.    Adrenal glands: Normal bilaterally.    Kidneys/Ureters/Bladder: Stable CT appearance of the kidneys and urinary bladder.    Gastrointestinal tract: Stomach, duodenum, small bowel, appendix are normal. Diverticulosis of the colon primarily involving the sigmoid portion.    Lymphatics: No measurable adenopathy.    Vasculature: Normal enhancement.    Peritoneum/Retroperitoneum: No evidence of pneumoperitoneum or ascites. No focal masses or fluid collections.    Abdominal wall/soft tissues: No acute abnormality.    Genital Organs: Stable CT appearance  of the uterus and adnexa.    Osseous structures: No suspicious osseous lesions are      Impression   IMPRESSION:    No CT evidence of acute abnormality in the abdomen and pelvis.    One of the larger cysts seen in the left hepatic lobe on the prior CT is no longer  present, reflecting interval rupture.    Colonic diverticulosis.       CT A/P 09/13/2023:     FINDINGS:    Lower chest: The chest findings are reported separately.    Liver: Normal morphology and attenuation.  *  There are multiple bilateral simple hepatic cysts. The largest is in segment 8 and measures 7.2 cm.  *  Arising from segment 3, there is an exophytic septated cystic lesion measuring 7.1 x 5 cm. (Series 306 image 126). This is not significantly changed in size since the recent comparison exam. The previously seen fat stranding adjacent to this cyst is mildly decreased. There are a few thin septations. No enhancing nodule.  *  There is a new cystic focus abutting the septated cystic lesion medially in segment 3 which measures 4.6 x 3.2 cm. There is mild thickening of the cyst wall. There is mild adjacent hyperemia best seen on the coronal images. (Series 605 image 40). (Series 306 image 122).    Biliary tree:No biliary ductal dilation. Normal gallbladder.    Spleen: Normal    Pancreas: Normal    Adrenal glands: Normal    Kidneys/Ureters/Bladder: Normal size without hydronephrosis.  No stones.  Multiple bilateral renal sinuses, left greater than right. Exophytic cortical cyst arising from the lower pole the right kidney measuring 3 cm.. No mass lesions. Normal appearance of the urinary bladder.    Gastrointestinal tract: Normal caliber and wall thickness. Colonic diverticulosis. Normal appendix.    Lymphatics: No lymphadenopathy.    Vasculature:  *  Mild atherosclerotic changes of the abdominal aorta and branch vessels. No aneurysm.  *  There are accessory left and right hepatic arteries arising from the left gastric and superior mesenteric arteries, respectively.  *  Portal vein, splenic vein and superior mesenteric vein are patent.    Peritoneum/Retroperitoneum: No free fluid, focal fluid collections or free air.    Genital Organs: Normal    Abdominal wall/Soft tissues: Normal    Osseous structures: No  acute osseous abnormalities or suspicious osseous lesions. Degenerative changes in the spine.      Impression   IMPRESSION:  Smaller size of the multiseptated cystic lesion in segment 3 of the liver with decrease of the adjacent inflammatory change. There is interval development of a new thick-walled cystic lesion medially in segment 3 which may represent abscess.      CT A/P 08/25/2023:  ABDOMEN AND PELVIS:    LIVER: Multiple hepatic cystic lesions, including a multilobulated lesion in the left hepatic lobe measuring 5 x 7.4 cm (series 305 image 32). This lesion is slightly increased from 3.7 x 6.7 cm on 12/29/2016. There are suspected internal septations without discrete enhancing nodule identified. There is adjacent fat stranding to this lesion (series 305 image 33), new from prior. Additional scattered subcentimeter low-density lesions liver too small to characterize but also favored to represent small cysts or hemangiomas.      GALLBLADDER AND BILIARY TREE: No intra and extra-hepatic biliary ductal dilation. Normal gallbladder.    SPLEEN: Not enlarged.    PANCREAS: Unremarkable.    ADRENAL GLANDS: Unremarkable.    KIDNEYS, URETERS AND BLADDER:  Symmetric renal enhancement. Right parapelvic and lower pole cysts. Left parapelvic renal cysts. No hydronephrosis. The urinary bladder is unremarkable.      GASTROINTESTINAL TRACT: Gastric, small bowel, colonic caliber and wall thickness are within normal limits. Appendix is not visualized; surgically absent by history. Colonic diverticulosis without evidence of acute diverticulitis.    LYMPHATICS: No lymphadenopathy.    VASCULATURE: Abdominal aorta is normal in caliber with minimal calcified atherosclerosis. Portal vein is patent.    PERITONEUM/RETROPERITONEUM: No free air or loculated fluid collections.    ABDOMINAL WALL/SOFT TISSUES: Unremarkable.    GENITAL STRUCTURES:  Uterus and ovaries are atrophic.    OSSEOUS STRUCTURES: No suspicious osseous lesion or acute  osseous abnormality.    Impression   IMPRESSION:    ABDOMEN AND PELVIS  Mild inflammatory changes adjacent to a multilobulated septated left hepatic cystic lesion; nonspecific and may represent infection or inflammation of the cystic lesion versus recent rupture.             Component  Ref Range & Units (hover) 09/22/23  1:21 PM   Total Bilirubin 0.5   Bilirubin, Direct 0.11   AST (SGOT) 25   ALT 18   Alkaline Phosphatase 69   Total Protein 6.6   Albumin 3.9   Bilirubin, Indirect 0.39            Component  Ref Range & Units (hover) 02/10/13 10:05 AM Comments   Pancreatic Elastase-1 181 Low         Severe Pancreatic Insufficiency:          <100         Moderate Pancreatic Insufficiency:   100 - 200         Normal:                                   >200           Review of Systems   Constitutional:  Positive for weight loss. Negative for chills and fever.   Respiratory:  Negative for cough and shortness of breath.    Cardiovascular:  Negative for chest pain.   Gastrointestinal:  Positive for abdominal pain, diarrhea and nausea. Negative for blood in stool, constipation, heartburn, melena and vomiting.   All other systems reviewed and are negative.       Physical Exam  Vitals reviewed.   HENT:      Head: Normocephalic.      Nose: Nose normal.      Mouth/Throat:      Pharynx: Oropharynx is clear.   Eyes:      Pupils: Pupils are equal, round, and reactive to light.   Cardiovascular:      Rate and Rhythm: Normal rate and regular rhythm.      Pulses: Normal pulses.   Abdominal:      General: Bowel sounds are normal.   Musculoskeletal:         General: Normal range of motion.      Cervical back: Normal range of motion.   Skin:     General: Skin is warm and dry.   Neurological:      Mental Status: She is alert and oriented to person, place, and time.   Psychiatric:         Behavior: Behavior normal.        No results for input(s): WBC, HGB, HCT, MCV, PLT in the last 72 hours.  No results for input(s): NA, K,  CL, CO2, PHOS, BUN, CREATININE in the last 72 hours.    Invalid input(s): CA  No results for input(s): AST, ALT, BILITOT, ALKPHOS in the last 72 hours.    Invalid input(s): ALB, BILIDIR  No results for input(s): LIPASE , AMYLASE  in the last 72 hours.  No results for input(s): PROT, INR in the last 72 hours.  No results for input(s): PTT in the last 72 hours.  No results for input(s): OCCULTBLD in the last 72 hours.  No results for input(s): AMMONIA in the last 72 hours.    @MEDICATIONSWITHREFILLS @          Assessment/Plan:     74 y.o. yo female here for EPV with Asberry Shawl, CNP to discuss EPI, abdominal pain    EPI  Chronic diarrhea              - Previously seen in 2014 for chronic diarrhea- having multiple malodorous stools per day              - Pancreatic elastase checked at that time - low at 181              - No documented history of pancreatitis on chart              - No alcohol or tobacco use              - Most recent CT A/P 09/2023 with normal pancreas, normal biliary tree- non-distended, normal gallbladder              - No record of EUS on chart              - Following with hepatology for newly found hepatic cystic lesions  - Normal renal panel and LFTs 09/2023, recheck 05/2024 WNL        - Patient with reported history of EPI with normal appearing pancreas on imaging, diarrhea is possibly secondary to EPI could also consider constipation with overflow diarrhea, IBS-D, SIBO due to reported improvement in symptoms after antibiotics, microscopic colitis   - Colonoscopy 02/2024 with diverticulosis   - Per chart review patient presented to the ER with complaints of abdominal pain 04/2024 and was told her hepatic cyst was bursting   - CT A/P 12/09 with no acute abnormality, previously visualized hepatic cyst is no longer seen- indicating possible rupture   - Discussed with patient that a ruptured hepatic cyst would likely not cause significant diarrhea and that this  was likely not the source of symptoms   - KUB with moderate stool burden and gas, stool collection greater at the RLQ- possibly indicative of constipation with overflow diarrhea   - Will send for EUS for further evaluation of abdominal pain in setting of IPMN and EPI   - Re-check hepatic panel, lipase - WNL   - H pylori negative   - Stool tests in process, stool WBC negative  - IgG4 in process  - Continue creon ; will increase from 2 capsules to 3 capsules with meals and 1 to 2 capsules with snacks - refill sent  - She has been given a prescription for lomotil - discussed with patient that she should limit use of lomotil and imodium  due to suspicion for IBS-D, constipation w/ overflow diarrhea and that these medications could be worsening symptoms  - Start Bentyl  for abdominal pain/cramping and diarrhea- okay to take Q8 hrs PRN        Colon cancer  screening              - The U.S. Multisociety Task Force (MSTF) on Colorectal Cancer suggests CRC screening in average-risk individuals?ages 45-49.?This recommendation is a strong endorsement to the May 2021 U.S. Preventive Services Task Force decision to lower the screening age to 2.  - The task force also reiterates its recommendations from 2017:  1. The MSTF strongly? recommends?CRC? screening?in?all individuals aged 74?to 75?who have not already initiated screening.  2. For individuals ages 27 to 64, the decision to start or continue screening should be individualized and based on prior screening history,?comorbidity, life expectancy, CRC risk, and personal preference.  3. Screening is not recommended after age 32.               - Screening colonoscopy completed 02/15/2024 with diverticulosis in the sigmoid colon and in the transverse colon.   - Recommended recall in 5 years 02/2029      RTO in 2 to 3 months      Ahmira Boisselle MARIE Eh Sauseda, CNP   05/11/2024

## 2024-05-17 NOTE — Patient Instructions (Addendum)
 Schedule EUS- office to call to schedule  Go to any [place] lab to submit blood work and stool samples  Increase Creon  to 3 capsules with meals - take two at the beginning of the meal and one during, 1 to 2 capsules with snacks  Continue Zofran  as needed for nausea  Start Bentyl  as needed for abdominal cramping  Follow up after procedure

## 2024-05-18 ENCOUNTER — Other Ambulatory Visit: Admit: 2024-05-18 | Payer: MEDICARE

## 2024-05-18 DIAGNOSIS — K769 Liver disease, unspecified: Principal | ICD-10-CM

## 2024-05-18 LAB — COMPREHENSIVE METABOLIC PANEL
ALT: 21 U/L (ref 7–52)
AST: 32 U/L (ref 13–39)
Albumin: 4.3 g/dL (ref 3.5–5.7)
Alkaline Phosphatase: 61 U/L (ref 36–125)
Anion Gap: 9 mmol/L (ref 3–16)
BUN: 25 mg/dL (ref 7–25)
CO2: 23 mmol/L (ref 21–33)
Calcium: 9.5 mg/dL (ref 8.6–10.3)
Chloride: 109 mmol/L (ref 98–110)
Creatinine: 1.03 mg/dL (ref 0.60–1.30)
EGFR: 57
Glucose: 106 mg/dL — ABNORMAL HIGH (ref 70–100)
Osmolality, Calculated: 297 mosm/kg (ref 278–305)
Potassium: 4.3 mmol/L (ref 3.5–5.3)
Sodium: 141 mmol/L (ref 133–146)
Total Bilirubin: 0.5 mg/dL (ref 0.0–1.5)
Total Protein: 6.8 g/dL (ref 6.4–8.9)

## 2024-05-18 LAB — C-REACTIVE PROTEIN: CRP: 1.6 mg/L (ref 1.0–10.0)

## 2024-05-18 LAB — H. PYLORI ANTIGEN, STOOL: H pylori Ag, Stl: NEGATIVE

## 2024-05-18 LAB — CBC
Hematocrit: 41.7 % (ref 35.0–45.0)
Hemoglobin: 13.9 g/dL (ref 11.7–15.5)
MCH: 31.1 pg (ref 27.0–33.0)
MCHC: 33.3 g/dL (ref 32.0–36.0)
MCV: 93.4 fL (ref 80.0–100.0)
MPV: 10 fL (ref 7.5–11.5)
Platelets: 202 10E3/uL (ref 140–400)
RBC: 4.47 10E6/uL (ref 3.80–5.10)
RDW: 14.2 % (ref 11.0–15.0)
WBC: 7.4 10E3/uL (ref 3.8–10.8)

## 2024-05-18 LAB — LIPASE: Lipase: 61 U/L (ref 4–82)

## 2024-05-18 LAB — IGG, SUBCLASS 4: Immuno G Subclass 4: 29 mg/dL (ref 2–96)

## 2024-05-18 LAB — STOOL FOR WBC EIA: White Blood Cells (WBC), Stool: NEGATIVE

## 2024-05-18 NOTE — Telephone Encounter (Signed)
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 Most recent imaging with normal pancreas, however she has been having worsening symptoms - diarrhea, abdominal pain, weight loss. She has had rupture of hepatic cysts. She has never had an EUS.    Thanks,    Asberry

## 2024-05-18 NOTE — Addendum Note (Signed)
 Addended by: WENDEE SORA on: 05/23/2024 02:46 PM     Modules accepted: Orders

## 2024-05-18 NOTE — Telephone Encounter (Signed)
 Spoke with patient and she is agreeable to schedule.  Patient scheduled for EUS on 05/23/24 at 2:00pm.   Confirmed that patient is not on any kind of anticoagulation therapy. Instructed patient to be NPO after midnight with the exception of blood pressure medication. Instructed to arrive at [facility] at 12:30pm with escort, patient verbalized understanding. Written instructions and driving directions sent to patient via MyChart.  Dr. Elroy and Asberry Shawl CNP informed.

## 2024-05-19 ENCOUNTER — Ambulatory Visit: Admit: 2024-05-19 | Payer: MEDICARE

## 2024-05-19 DIAGNOSIS — R197 Diarrhea, unspecified: Principal | ICD-10-CM

## 2024-05-22 ENCOUNTER — Inpatient Hospital Stay: Admit: 2024-05-22 | Discharge: 2024-05-26 | Payer: MEDICARE | Attending: Surgical Oncology

## 2024-05-22 ENCOUNTER — Ambulatory Visit: Admit: 2024-05-22 | Discharge: 2024-05-22 | Payer: MEDICARE

## 2024-05-22 ENCOUNTER — Ambulatory Visit: Admit: 2024-05-22 | Payer: MEDICARE

## 2024-05-22 DIAGNOSIS — N1831 Chronic kidney disease, stage 3a (CMS-HCC): Secondary | ICD-10-CM

## 2024-05-22 DIAGNOSIS — K7689 Other specified diseases of liver: Secondary | ICD-10-CM

## 2024-05-22 LAB — RENAL FUNCTION PANEL W/EGFR
Albumin: 4.5 g/dL (ref 3.5–5.7)
Anion Gap: 11 mmol/L (ref 3–16)
BUN: 46 mg/dL — ABNORMAL HIGH (ref 7–25)
CO2: 20 mmol/L — ABNORMAL LOW (ref 21–33)
Calcium: 9.6 mg/dL (ref 8.6–10.3)
Chloride: 103 mmol/L (ref 98–110)
Creatinine: 2.57 mg/dL — ABNORMAL HIGH (ref 0.60–1.30)
EGFR: 19
Glucose: 91 mg/dL (ref 70–100)
Osmolality, Calculated: 289 mosm/kg (ref 278–305)
Phosphorus: 4.9 mg/dL — ABNORMAL HIGH (ref 2.1–4.5)
Potassium: 4.5 mmol/L (ref 3.5–5.3)
Sodium: 134 mmol/L (ref 133–146)

## 2024-05-22 LAB — CBC
Hematocrit: 40 % (ref 35.0–45.0)
Hemoglobin: 14.1 g/dL (ref 11.7–15.5)
MCH: 32.4 pg (ref 27.0–33.0)
MCHC: 35.2 g/dL (ref 32.0–36.0)
MCV: 91.8 fL (ref 80.0–100.0)
MPV: 10.3 fL (ref 7.5–11.5)
Platelets: 205 10E3/uL (ref 140–400)
RBC: 4.35 10E6/uL (ref 3.80–5.10)
RDW: 14.4 % (ref 11.0–15.0)
WBC: 10.9 10E3/uL — ABNORMAL HIGH (ref 3.8–10.8)

## 2024-05-22 MED ORDER — IOHEXOL 350 MG IODINE/ML INTRAVENOUS SOLUTION
350 | Freq: Once | INTRAVENOUS | Status: AC | PRN
Start: 2024-05-22 — End: 2024-05-22
  Administered 2024-05-22: 09:00:00 via INTRAVENOUS

## 2024-05-22 MED FILL — OMNIPAQUE 350 MG IODINE/ML INTRAVENOUS SOLUTION: 350 350 mg iodine/mL | INTRAVENOUS | Qty: 125 | Fill #0

## 2024-05-22 NOTE — Progress Notes (Signed)
 Renal Outpatient Initial Evaluation      Seen By Nephrology For Chief Complaint of : Decreased EGFR    Referred by : Dr. Patsie    Interval history on 05/22/2024:  Joanna Lopez comes here for follow-up of her acute renal injury.  This was sustained a few weeks ago when she was at [facility].  She was found to be dehydrated received a ton of fluids because she said that she could not stop having diarrhea for over 14 hours.  Kidney function could not be realized in that hospital records.  Will recheck her labs today again.  Blood pressure is on the lower side but she is completely asymptomatic.    Recently saw her GI doctor.  She was restarted on Creon .  Continues to to be on her home medicines.  She just needs lab check today to make sure her renal function has gone back to normal.      History (HPI) :  Joanna Lopez is an 74 y.o. female who was referred by her primary care physician on insistence of the patient who noticed that her estimated GFR was slowly declining on the renal panel that she was observing in my chart.  Patient has a history of hypertension that has been relatively well-controlled.  Had no issues with any hospitalization.  She also had obstructive sleep apnea that was amenable to therapy with reconstructive surgery of her upper airways.  Patient also had a stroke about 5 years ago where she had crossing of her eyes as well as difficulty in speech.  All the symptoms improved on their own.  Of note is the fact that she had history of lung cancer that required partial lobectomy.  She also followed this up with cisplatin  and experimental drug and chemotherapy at that time.  Patient has been cancer free.  She does not do any over-the-counter NSAIDs.  SHe is very health-conscious.  No recent hospitalization    Worried about that her kidneys might fail.  Hence the reason for the referral.        Past Medical History:   Diagnosis Date    Anxiety     Basal cell cancer     Cataract      Hyperlipidemia     Hypertension     Lung cancer (CMS-HCC)     OSA (obstructive sleep apnea)     RLS (restless legs syndrome)     Stroke (CMS-HCC)     5 yrs ago, TIA X 2     Past Surgical History:   Procedure Laterality Date    APPENDECTOMY      CATARACT EXTRACTION, BILATERAL Bilateral 07/2021    COLONOSCOPY N/A 01/23/2013    Procedure: COLONOSCOPY WITH MAC;  Surgeon: Rankin Dellen, MD;  Location: [facility] ENDOSCOPY;  Service: Gastroenterology;  Laterality: N/A;    COLONOSCOPY N/A 02/15/2024    Procedure: COLONOSCOPY W/ OR W/O BIOPSY;  Surgeon: [name] CHRISTELLA Beckmann, MD;  Location: [facility] ENDOSCOPY;  Service: Gastroenterology;  Laterality: N/A;    FLEXIBLE BRONCHOSCOPY W/ UPPER ENDOSCOPY N/A 09/17/2016    Procedure: BRONCHOSCOPY SUPER D WITH FLUORO AND BIOPSY-EBUS/NAVI;  Surgeon: Sadia Benzaquen, MD;  Location: [facility] ENDOSCOPY;  Service: Pulmonary;  Laterality: N/A;    INSERTION CATHETER VASCULAR ACCESS N/A 12/26/2021    Procedure: INSERTION POWER PORT A CATH;  Surgeon: Lucendia Blanch, MD;  Location: [facility] OR;  Service: General;  Laterality: N/A;    LOOP IMPLANT IMPLANTATION N/A 04/07/2017    Procedure: Loop  Implantation;  Surgeon:  Lue Legions, MD;  Location: [facility] ELECTROPHYSIOLOGY LAB;  Service: Electrophysiology;  Laterality: N/A;    LOOP REMOVAL Left 04/09/2021    Procedure: Loop Removal;  Surgeon: Rory Osier, MD;  Location: [facility] MIS SUITE;  Service: Electrophysiology;  Laterality: Left;    submastoid gland removal      THORACOSCOPY Right 11/23/2016    Procedure: RIGHT SIDED VATS MIDDLE LOBE LOBECTOMY,HILAR MEDIASTINAL LYMPH NODE DISSECTION;  Surgeon: Nena Humbles, MD;  Location: [facility] OR;  Service: Thoracic;  Laterality: Right;    THORACOSCOPY LOBECTOMY VIDEO ASSISTED Left 11/18/2021    Procedure: Left Thoracoscopic Lingulectomy;  Surgeon: Nena Humbles, MD;  Location: [facility] OR;  Service: General;  Laterality: Left;    TONSILLECTOMY      UVULOPALATOPHARYNGOPLASTY       Family History   Adopted: Yes   Problem Relation Age of  Onset    Diabetes Son      Social History[1]      ROS.  General :  No fevers chills sweats, anorexia, unexpected weight change  Eyes :   No pain photophobia   ENT :   No ear pain, hearing loss    No mouth sores, nasal sores, or sinusitis.  CV :   No chest pain, palpitations,     No SOB, SOBOE, orthopnoea, PND,     No ankle swelling  RS :   No cough, wheeze, sputum, haemoptysis.  GIT :   No N, V, abdominal pain, C, D,   GUT :   No dysuria, frequency, urgency, heamaturia, flank pain.    MSK :   No muscular pain, cramps, weakness,   Neuro :  No faints, falls, fits, HA,   Skin :   No rash, itch,   Haem : No lumps, bruising, bleeding,   Endo :   No cold/ heat intolerance, polyuria, polydipsia,  All/Imm :  No persistent infections, uriticaria  Psych :  No anxiety, depressed mood, memory loss.      PHYSICAL EXAM    See vitals recorded in the chart.    Constitutional :Alert, appropriate, no acute distress,     well developed, nourished, hydrated,    Skin :   No rash, lesions, warm to touch  Head :   atraumatic, normocephalic.  Eyes :   no injection, icterus, clinical anaemia,     EOM intact grossly, vision grossly normal.    PERRLA, No nystagmus.  ENT :   external ears, nose & tongue normal,     no gum disease, dentition grossly intact  Neck :   supple, No Thy mass, Tracheal shift, elevated JVD  Chest :  No respiratory distress, Percussion note normal,    Bilat. clear to auscultation, BS vesicular + 0   CVS :   HR regular, HS S1 S2     No murmur  no rub    LE edema none  Pulses :  present, normal, equal bilaterally  ABD :   non-distended, soft, non-tender,     no mass, no palpable organomegaly,     no briut, BS normal.  Spine :  no kyphosis, no scoliosis  Extremities :  No joint swelling, muscle wasting,   Neuro :  CN 3-12 grossly intact, UE & LE power and sensation grossly intact,   Psych :  mood and affect appropriate, normal interaction, orientation, eye contact.    Allergies[2]    Home Medications   Medication Sig  Taking? Last Dose   aspirin  325 MG tablet Take  1 tablet (325 mg total) by mouth daily.     atorvastatin  (LIPITOR ) 80 MG tablet Take 1 tablet (80 mg total) by mouth every morning.     BIOTIN ORAL Take 1 tablet by mouth daily.     busPIRone  (BUSPAR ) 10 MG tablet Take 1 tablet (10 mg total) by mouth in the morning and at bedtime.     dicyclomine  (BENTYL ) 10 MG capsule Take 1 capsule (10 mg total) by mouth every 8 hours as needed.     gabapentin  (NEURONTIN ) 300 MG capsule Take 1 capsule (300 mg total) by mouth 3 times a day.     leucovorin-pyridox-mecobalamin (FOLINIC-PLUS) 4-50-2 mg Tab Take 1 tablet by mouth daily.     levETIRAcetam  ER (KEPPRA  XR) 500 mg 24 hr tablet Take 2 tablets (1,000 mg total) by mouth daily.     lipase -protease -amylase , pork, (CREON ) 24,000-76,000 -120,000 unit CpDR Take 3 capsules by mouth 3 times a day as needed (for abdominal cramping). Take prior to meals     lisinopriL  (PRINIVIL ) 10 MG tablet Take 1 tablet (10 mg total) by mouth daily.     loperamide  (IMODIUM ) 2 mg capsule Take 1-2 capsules (2-4 mg total) by mouth 4 times a day as needed for Diarrhea. Take 4 mg (2 capsules) after the first loose stool and then 1 capsule for each loose stool thereafter     MELATONIN ORAL Take 10 mg by mouth at bedtime as needed (sleep).     multivitamin (THERAGRAN) tablet Take 1 tablet by mouth daily.     ondansetron  (ZOFRAN ) 8 MG tablet Take 1 tablet (8 mg total) by mouth every 8 hours as needed for Nausea.     PARoxetine  (PAXIL ) 20 MG tablet Take 1 tablet (20 mg total) by mouth at bedtime.     polyethylene glycol (GOLYTELY ) 236-22.74-6.74 -5.86 gram solution Please take your prep according to the instructions outlined in your prep letter in messages and communications.  Patient not taking: Reported on 05/17/2024     roPINIRole  (REQUIP ) 1 MG tablet Take 1 tablet (1 mg total) by mouth at bedtime. Indications: Restless Legs Syndrome           DATA :     Lab Results   Component Value Date    CREATININE 1.03  05/18/2024    BUN 25 05/18/2024    NA 141 05/18/2024    K 4.3 05/18/2024    CL 109 05/18/2024    CO2 23 05/18/2024     Lab Results   Component Value Date    CALCIUM  9.5 05/18/2024    PHOS 3.8 11/29/2023     No results found for: [facility]  Lab Results   Component Value Date    WBC 7.4 05/18/2024    HGB 13.9 05/18/2024    HCT 41.7 05/18/2024    MCV 93.4 05/18/2024    PLT 202 05/18/2024     Lab Results   Component Value Date    IRON 86 06/08/2013    TIBC 336 06/08/2013    FERRITIN 67.2 06/08/2013     Lab Results   Component Value Date    KETONESU Negative 04/18/2024    BLOODU Negative 04/18/2024    BILIRUBINUR Negative 04/18/2024    UROBILINOGEN <2.0 04/18/2024    PROTEINUA Negative 04/18/2024    NITRITE Negative 04/18/2024    LEUKOCYTESUR Negative 04/18/2024    PHUR 6.0 04/18/2024    LABSPEC >1.035 (H) 04/18/2024    CLARITYU Clear 04/18/2024    COLORU Straw 04/18/2024  In addition to the above, I have reviewed an extensive amount of complex data.    Morbidity / complication risk is felt to be :  moderate      Summary :  Joanna Lopez is an 74 y.o. female.  With history of declining EGFR repeat of time.  Risk factors being high blood pressure, history of lung cancer requiring multiple imagings as well as cisplatin -based chemotherapy with remission of the cancer completely.  Other contributing factor could be dietary.  See the discussion below.      Assessment:       CKD: Stage III based on estimated GFR of 57.  She recently had a bout of AKI.  Was at [facility].  According to her she needed liters of fluid and gained the weight back.  Will she check her labs again today to make sure it has gone back to her baseline.    Electrolytes : Normal  Acid/Base normal bicarb  Vol. Status euvolemic  BP : Very well-controlled  Anemia not an issue at this point  SHPT : Calcium  phosphorus normal  Education: Educated patient about the CKD.  In addition to avoiding over-the-counter NSAIDs educated about the  diet.  She needs to avoid red meats.  Also dark-colored colas which she lowers.  She will switch over to plain water  or flavored water  as a substitute.    Also avoid angiographic dyes and if necessary take prophylaxis.    Try to keep an ideal body weight by moderate exercise.  Has been pretty good about following her dietary advice.  Cut back on the red meat.  She is completely avoiding NSAIDs.  Renal panel orders placed.  Patient will get it done after the office visit today.    Plan:  Follow-up in 6 months unless results show otherwise.  Patient is can communicate in between using MyChart if necessary.              Pearla Oyster, MD  05/22/2024    This note was completely edited, written and reviewed by me and consists of information cut and pasted from the my most recent visit, my smart phrases and other Epic tools. I have personally reviewed all aspects of this note to at least include reviewing this patient's chart and problem list, updating the history, physical exam, lab and procedure results, and assessment and plan as detailed above and below.  As such this visit note reflects my current evaluation and management for this patient.          [1]   Social History  Tobacco Use    Smoking status: Never    Smokeless tobacco: Never   Vaping Use    Vaping status: Never Used   Substance Use Topics    Alcohol use: Not Currently     Comment: occasionally    Drug use: No   [2]   Allergies  Allergen Reactions    Penicillins Anaphylaxis     Facial Swelling  immediate rash, facial/tongue/throat swelling, SOB or lightheadedness with hypotension,   severe rash involving mucus membranes or skin necrosis    Adhesive      Other reaction(s): breaks out and itches if on for long time    Ciprofloxacin  Other (See Comments)     Tendonitis.     Formaldehyde Analogues And Releasers     Nsaids (Non-Steroidal Anti-Inflammatory Drug)      GI issues    Promethazine  Hcl      Restless legs  Latex Rash    Sulfa (Sulfonamide Antibiotics)  Rash

## 2024-05-22 NOTE — Progress Notes (Signed)
 05/22/24 1354   Pre-op Phone Call   Surgery Location Verified Yes   Remind patient to bring picture ID and insurance card Yes   Medical History Reviewed Yes   NPO Status Reinforced Yes   Ride and Caregiver Arranged Yes   Instructions to bring current medication list Yes   Interpreter Arranged/Needed No   Instructions Understood? Yes   Call Stop Time 1357     Spoke with Pt to confirm EUS appointment on 05-23-24. Discussed NPO status after mn (ok to have water  up until 6hr prior to procedure per MA note) and has ride arranged for afterwards. Discussed medications as well.  Has been off aspirin  for a couple weeks. Pt has no further questions at this time and advised to call if some come about.

## 2024-05-23 ENCOUNTER — Ambulatory Visit: Payer: MEDICARE

## 2024-05-23 MED ORDER — ONDANSETRON HCL (PF) 4 MG/2 ML INJECTION SOLUTION
4 | Freq: Once | INTRAMUSCULAR | PRN
Start: 2024-05-23 — End: 2024-05-23

## 2024-05-23 MED ORDER — DEXTROSE 10%-WATER 250 ML FOR HYPOGLYCEMIA
INTRAVENOUS | PRN
Start: 2024-05-23 — End: 2024-05-23

## 2024-05-23 MED ORDER — PROPOFOL 10 MG/ML FOR OR USE ONLY
10 | INTRAVENOUS | PRN
Start: 2024-05-23 — End: 2024-05-23
  Administered 2024-05-23: 14:00:00 via INTRAVENOUS

## 2024-05-23 MED ORDER — LACTATED RINGERS INTRAVENOUS SOLUTION
INTRAVENOUS
Start: 2024-05-23 — End: 2024-05-23

## 2024-05-23 MED ORDER — GLUCOSE 4 GRAM CHEWABLE TABLET (WRAPPER)
4 | ORAL | PRN
Start: 2024-05-23 — End: 2024-05-23

## 2024-05-23 MED ORDER — LIDOCAINE (PF) 20 MG/ML (2 %) INJECTION SOLUTION
20 | Freq: Once | INTRAMUSCULAR | PRN
Start: 2024-05-23 — End: 2024-05-23

## 2024-05-23 MED ORDER — PROPOFOL 10 MG/ML 20 ML INFUSION FOR OR ONLY
10 | INTRAVENOUS | PRN
Start: 2024-05-23 — End: 2024-05-23
  Administered 2024-05-23: 14:00:00 via INTRAVENOUS

## 2024-05-23 MED ORDER — PEPPERMINT OIL
PRN
Start: 2024-05-23 — End: 2024-05-23

## 2024-05-23 MED ORDER — NALOXONE 0.4 MG/ML INJECTION SOLUTION
0.4 | INTRAMUSCULAR | PRN
Start: 2024-05-23 — End: 2024-05-23

## 2024-05-23 MED ORDER — SODIUM CHLORIDE 0.9 % INTRAVENOUS SOLUTION
INTRAVENOUS
Start: 2024-05-23 — End: 2024-05-23
  Administered 2024-05-23: 13:00:00 via INTRAVENOUS

## 2024-05-23 MED ORDER — LIDOCAINE (PF) 20 MG/ML (2 %) INTRAVENOUS SOLUTION
20 | INTRAVENOUS | PRN
Start: 2024-05-23 — End: 2024-05-23
  Administered 2024-05-23: 14:00:00 via INTRAVENOUS

## 2024-05-23 MED ORDER — ACETAMINOPHEN 325 MG TABLET
325 | ORAL | Status: AC | PRN
Start: 2024-05-23 — End: 2024-05-23
  Administered 2024-05-23: 13:00:00 via ORAL

## 2024-05-23 MED FILL — SODIUM CHLORIDE 0.9 % INTRAVENOUS SOLUTION: 50.0000 50.0000 mL/hr | INTRAVENOUS | Qty: 1000 | Fill #0

## 2024-05-23 MED FILL — TYLENOL 325 MG TABLET: 325 325 mg | ORAL | Qty: 3 | Fill #0

## 2024-05-23 NOTE — Anesthesia Postprocedure Evaluation (Signed)
 Anesthesia Post Note    Patient: Joanna Lopez    Procedure(s) Performed: Procedure(s):  UPPER EUS  EGD WITH BIOPSY    Anesthesia type: MAC        Patient location:  PACU     Airway patency:  Patent     Pain management:  Adequate     PONV status:  None     Hydration status:  Adequate     Post assessment:  Tolerated procedure well     Level of consciousness:  Awake     Post vitals:  Stable         Last Vitals:   Vitals:    05/23/24 1300 05/23/24 1443 05/23/24 1457 05/23/24 1500   BP: 106/72 99/58 (!) 87/57 117/74   BP Location:       Patient Position:       Pulse: 96 82 71 71   Resp:  14     Temp:  97.9 ??F (36.6 ??C)     TempSrc:  Oral     SpO2: 96% 95% 99% 98%   Weight:       Height:            Last Temperature: 97.9 ??F (36.6 ??C) (05/23/2024  2:43 PM)      Complications:  There were no known notable events for this encounter.

## 2024-05-23 NOTE — Transfer of Care (Signed)
 Anesthesia Transfer of Care Note    Patient: Joanna Lopez  Procedure(s) Performed: Procedure(s):  UPPER EUS  EGD WITH BIOPSY    Patient location: Same Day Surgery    Anesthesia type: MAC    Airway Device on Arrival to PACU/ICU: Room Air    IV Access: Peripheral    Monitors Recommended to be Used During PACU/ICU: Standard Monitors    Outstanding Issues to Address: None    Level of Consciousness: awake, alert , and oriented    Post vital signs:    Vitals:    05/23/24 1443   BP: 99/58   Pulse: 78   Resp: 14   Temp: 97.9 ??F (36.6 ??C)   SpO2: 97%       Complications:  No notable events documented.    Date 05/22/24 0700 - 05/23/24 0659(Not Admitted) 05/23/24 0700 - 05/24/24 0659   Shift 0700-1459 1500-2259 2300-0659 24 Hour Total 0700-1459 1500-2259 2300-0659 24 Hour Total   INTAKE   I.V.     700(8.7)   700(8.7)     I.V.     300   300     Volume (mL) (sodium chloride  0.9 % IV infusion)     400   400   Shift Total(mL/kg)     700(8.7)   700(8.7)   OUTPUT   Shift Total(mL/kg)           Weight (kg)     80.7 80.7 80.7 80.7

## 2024-05-23 NOTE — Anesthesia Preprocedure Evaluation (Signed)
 [facility]  DEPARTMENT OF ANESTHESIOLOGY  PRE-PROCEDURAL EVALUATION    Joanna Lopez is a 74 y.o. year old female presenting for:    Procedure(s):  UPPER EUS    Surgeon:   [name] CHRISTELLA Beckmann, MD    Chief Complaint     idiopathic EPI, diarrhea, abd pain, weight loss    Review of Systems     Anesthesia Evaluation    Patient summary reviewed, nursing notes reviewed and Previous anesthetic and pre-operative note copied, reviewed and updated as necessary.  All other systems reviewed and are negative.     I have reviewed the History and Physical Exam, any relevant changes are noted in the anesthesia pre-operative evaluation.      Cardiovascular:      (+) hyperlipidemia.    Hypertension is.        Neuro/Muscoloskeletal/Psych:    (+) TIA (x 2), arthritis and anxiety.    CVA (residual ataxia) residual symptoms.     Pulmonary:          Sleep apnea.         GI/Hepatic/Renal:    (+) liver disease (liver cysts).  GERD is.    Chronic renal disease (creatinine 2.57), CRI.    Endo/Other:    (+) cancer (lung CA).         Comments: Pancreatic insufficiency taking creon          Past Medical History     Past Medical History:   Diagnosis Date   ??? Anxiety    ??? Basal cell cancer    ??? Cataract    ??? Hyperlipidemia    ??? Hypertension    ??? Lung cancer (CMS-HCC)    ??? OSA (obstructive sleep apnea)    ??? RLS (restless legs syndrome)    ??? Stroke (CMS-HCC)     5 yrs ago, TIA X 2       Past Surgical History     Past Surgical History:   Procedure Laterality Date   ??? APPENDECTOMY     ??? CATARACT EXTRACTION, BILATERAL Bilateral 07/2021   ??? COLONOSCOPY N/A 01/23/2013    Procedure: COLONOSCOPY WITH MAC;  Surgeon: Rankin Dellen, MD;  Location: [facility] ENDOSCOPY;  Service: Gastroenterology;  Laterality: N/A;   ??? COLONOSCOPY N/A 02/15/2024    Procedure: COLONOSCOPY W/ OR W/O BIOPSY;  Surgeon: [name] CHRISTELLA Beckmann, MD;  Location: [facility] ENDOSCOPY;  Service: Gastroenterology;  Laterality: N/A;   ??? FLEXIBLE BRONCHOSCOPY W/ UPPER ENDOSCOPY N/A 09/17/2016    Procedure:  BRONCHOSCOPY SUPER D WITH FLUORO AND BIOPSY-EBUS/NAVI;  Surgeon: Sadia Benzaquen, MD;  Location: [facility] ENDOSCOPY;  Service: Pulmonary;  Laterality: N/A;   ??? INSERTION CATHETER VASCULAR ACCESS N/A 12/26/2021    Procedure: INSERTION POWER PORT A CATH;  Surgeon: Lucendia Blanch, MD;  Location: [facility] OR;  Service: General;  Laterality: N/A;   ??? LOOP IMPLANT IMPLANTATION N/A 04/07/2017    Procedure: Loop  Implantation;  Surgeon: Lue Legions, MD;  Location: [facility] ELECTROPHYSIOLOGY LAB;  Service: Electrophysiology;  Laterality: N/A;   ??? LOOP REMOVAL Left 04/09/2021    Procedure: Loop Removal;  Surgeon: Rory Osier, MD;  Location: [facility] SUITE;  Service: Electrophysiology;  Laterality: Left;   ??? submastoid gland removal     ??? THORACOSCOPY Right 11/23/2016    Procedure: RIGHT SIDED VATS MIDDLE LOBE LOBECTOMY,HILAR MEDIASTINAL LYMPH NODE DISSECTION;  Surgeon: Nena Humbles, MD;  Location: [facility] OR;  Service: Thoracic;  Laterality: Right;   ??? THORACOSCOPY LOBECTOMY VIDEO ASSISTED Left 11/18/2021    Procedure:  Left Thoracoscopic Lingulectomy;  Surgeon: Nena Humbles, MD;  Location: [facility] OR;  Service: General;  Laterality: Left;   ??? TONSILLECTOMY     ??? UVULOPALATOPHARYNGOPLASTY         Family History     Family History   Adopted: Yes   Problem Relation Age of Onset   ??? Diabetes Son        Social History     Social History     Socioeconomic History   ??? Marital status: Widowed     Spouse name: Not on file   ??? Number of children: Not on file   ??? Years of education: Not on file   ??? Highest education level: Not on file   Occupational History   ??? Not on file   Tobacco Use   ??? Smoking status: Never   ??? Smokeless tobacco: Never   Vaping Use   ??? Vaping status: Never Used   Substance and Sexual Activity   ??? Alcohol use: Not Currently     Comment: occasionally   ??? Drug use: No   ??? Sexual activity: Not Currently     Partners: Male   Other Topics Concern   ??? Caffeine Use No     Comment: 1 can coke/day   ??? Occupational Exposure Yes     Comment:  elemenatary school secretary   ??? Exercise No   ??? Seat Belt Yes     Social Drivers of Health     Financial Resource Strain: Not on file   Food Insecurity: No Food Insecurity (10/25/2023)    Yearly Questionnaire    ??? Do you need any assistance with obtaining housing, meals, medication, transportation or medical equipment?: No    ??? Assistance needed for:: Not on file   Transportation Needs: No Transportation Needs (10/25/2023)    Yearly Questionnaire    ??? Do you need any assistance with obtaining housing, meals, medication, transportation or medical equipment?: No    ??? Assistance needed for:: Not on file   Physical Activity: Not on file   Stress: Not on file   Social Connections: Not on file   Intimate Partner Violence: Not At Risk (05/23/2024)    Humiliation, Afraid, Rape, and Kick questionnaire    ??? Fear of Current or Ex-Partner: No    ??? Emotionally Abused: No    ??? Physically Abused: No    ??? Sexually Abused: No       Medications     Allergies:  Allergies[1]    Home Meds:  Home Medications   Medication Sig Taking? Last Dose   aspirin  325 MG tablet Take 1 tablet (325 mg total) by mouth daily. Yes 05/22/2024   atorvastatin  (LIPITOR ) 80 MG tablet Take 1 tablet (80 mg total) by mouth every morning. Yes 05/22/2024   BIOTIN ORAL Take 1 tablet by mouth daily. Yes Past Week   busPIRone  (BUSPAR ) 10 MG tablet Take 1 tablet (10 mg total) by mouth in the morning and at bedtime. Yes 05/22/2024   dicyclomine  (BENTYL ) 10 MG capsule Take 1 capsule (10 mg total) by mouth every 8 hours as needed. Yes Past Week   gabapentin  (NEURONTIN ) 300 MG capsule Take 1 capsule (300 mg total) by mouth 3 times a day. Yes 05/23/2024   leucovorin-pyridox-mecobalamin (FOLINIC-PLUS) 4-50-2 mg Tab Take 1 tablet by mouth daily. Yes Past Week   lipase -protease -amylase , pork, (CREON ) 24,000-76,000 -120,000 unit CpDR Take 3 capsules by mouth 3 times a day as needed (for abdominal cramping). Take prior  to meals Yes Past Month   lisinopriL  (PRINIVIL ) 10 MG tablet Take  1 tablet (10 mg total) by mouth daily. Yes 05/22/2024   loperamide  (IMODIUM ) 2 mg capsule Take 1-2 capsules (2-4 mg total) by mouth 4 times a day as needed for Diarrhea. Take 4 mg (2 capsules) after the first loose stool and then 1 capsule for each loose stool thereafter Yes Past Month   MELATONIN ORAL Take 10 mg by mouth at bedtime as needed (sleep). Yes 05/22/2024   multivitamin (THERAGRAN) tablet Take 1 tablet by mouth daily. Yes 05/22/2024   ondansetron  (ZOFRAN ) 8 MG tablet Take 1 tablet (8 mg total) by mouth every 8 hours as needed for Nausea. Yes Past Month   PARoxetine  (PAXIL ) 20 MG tablet Take 1 tablet (20 mg total) by mouth at bedtime. Yes 05/22/2024   roPINIRole  (REQUIP ) 1 MG tablet Take 1 tablet (1 mg total) by mouth at bedtime. Indications: Restless Legs Syndrome Yes 05/22/2024   levETIRAcetam  ER (KEPPRA  XR) 500 mg 24 hr tablet Take 2 tablets (1,000 mg total) by mouth daily.  Patient not taking: No sig reported     polyethylene glycol (GOLYTELY ) 236-22.74-6.74 -5.86 gram solution Please take your prep according to the instructions outlined in your prep letter in messages and communications.  Patient not taking: Reported on 05/22/2024           Inpatient Meds:  Scheduled:   Continuous:   ??? lactated Ringers      ??? sodium chloride  0.9 % 50 mL/hr (05/23/24 1257)       PRN: dextrose  10% in water  **OR** dextrose  10% in water , glucose, lidocaine  (PF) 2% (20 mg/mL)    Vital Signs     Wt Readings from Last 3 Encounters:   05/23/24 178 lb (80.7 kg)   05/22/24 178 lb (80.7 kg)   05/17/24 182 lb (82.6 kg)     Ht Readings from Last 3 Encounters:   05/23/24 5' 6 (1.676 m)   05/17/24 5' 6 (1.676 m)   05/17/24 5' 6 (1.676 m)     Temp Readings from Last 3 Encounters:   05/23/24 98.2 ??F (36.8 ??C) (Oral)   04/18/24 97.3 ??F (36.3 ??C) (Temporal)   02/15/24 97.6 ??F (36.4 ??C) (Oral)     BP Readings from Last 3 Encounters:   05/23/24 106/72   05/22/24 (!) 88/64   05/17/24 126/78     Pulse Readings from Last 3 Encounters:   05/23/24  96   05/22/24 94   05/17/24 96     SpO2 Readings from Last 3 Encounters:   05/23/24 96%   05/22/24 97%   05/17/24 99%       Physical Exam     Airway:     Mallampati: II  Mouth Opening: >2 FB  TM distance: > = 3 FB  Comment: Narrow palate    Dental:   - No obvious cracked, loose, chipped, or missing teeth.     Pulmonary:      Breathing: unlabored     Breath sounds clear to auscultation.       Cardiovascular:     Rhythm: regular  Rate: normal    Neuro/Musculoskeletal/Psych:      Abdominal:       Current OB Status:       Other Findings:        Laboratory Data     Lab Results   Component Value Date    WBC 10.9 (H) 05/22/2024    HGB 14.1 05/22/2024  HCT 40.0 05/22/2024    MCV 91.8 05/22/2024    PLT 205 05/22/2024       No results found for: [facility]    Lab Results   Component Value Date    GLUCOSE 91 05/22/2024    BUN 46 (H) 05/22/2024    CO2 20 (L) 05/22/2024    CREATININE 2.57 (H) 05/22/2024    K 4.5 05/22/2024    NA 134 05/22/2024    CL 103 05/22/2024    CALCIUM  9.6 05/22/2024    ALBUMIN 4.5 05/22/2024    PROT 6.8 05/18/2024    ALKPHOS 61 05/18/2024    ALT 21 05/18/2024    AST 32 05/18/2024    BILITOT 0.5 05/18/2024       Lab Results   Component Value Date    INR 0.9 09/08/2023       No results found for: PREGTESTUR, PREGSERUM, HCG, HCGQUANT    Anesthesia Plan     ASA 3     Anesthesia Type:  general endotracheal.   PONV Risk Factors: female, current non-smoker   Induction: Intravenous induction.  Additional comments:   Intubation in 2023 with McGrath 3, grade I view    Plan, alternatives, and risks of anesthesia, including death, have been explained to and discussed with patient. Questions answered / anesthesia plan accepted.       Plan discussed with CRNA.               [1]  Allergies  Allergen Reactions   ??? Penicillins Anaphylaxis     Facial Swelling  immediate rash, facial/tongue/throat swelling, SOB or lightheadedness with hypotension,   severe rash involving mucus membranes or skin necrosis   ??? Adhesive       Other reaction(s): breaks out and itches if on for long time   ??? Ciprofloxacin  Other (See Comments)     Tendonitis.    ??? Formaldehyde Analogues And Releasers    ??? Nsaids (Non-Steroidal Anti-Inflammatory Drug)      GI issues   ??? Promethazine  Hcl      Restless legs   ??? Latex Rash   ??? Sulfa (Sulfonamide Antibiotics) Rash

## 2024-05-23 NOTE — Op Note (Signed)
 [facility]  GI   _______________________________________________________________________________  Procedure Date: 05/23/2024 2:01 PM     Patient Name: Joanna Lopez  MRN: [id number]                         Account Number: [id number]  Date of Birth: January 06, 1951              Admit Type: Inpatient  Age: 74                               Gender: Female  Note Status: Finalized                Attending MD: [name] Beckmann , MD, [id number]  _______________________________________________________________________________     Procedure:               Upper EUS  Indications:             Chronic pancreatitis, Abdominal pain  Providers:               [name] Beckmann, MD  Referring MD:            Asberry Shawl, Vernell DRAFTS Dozier, CNP, Azzie Reagin, M.D  Medicines:               Monitored Anesthesia Care  Complications:           No immediate complications.  _______________________________________________________________________________  Procedure:               Pre-Anesthesia Assessment:                           - Prior to the procedure, a History and Physical was                            performed, and patient medications and allergies                            were reviewed. The patient is competent. The risks                            and benefits of the procedure and the sedation                            options and risks were discussed with the patient.                            All questions were answered and informed consent was                            obtained. Patient identification and proposed                            procedure were verified by the physician, the nurse  and the anesthetist in the procedure room. Mental                            Status Examination: alert and oriented. Airway                            Examination: normal oropharyngeal airway and neck                            mobility. ASA Grade Assessment: II - A patient with                             mild systemic disease. After reviewing the risks and                            benefits, the patient was deemed in satisfactory                            condition to undergo the procedure. The anesthesia                            plan was to use monitored anesthesia care (MAC).                            Immediately prior to administration of medications,                            the patient was re-assessed for adequacy to receive                            sedatives. The heart rate, respiratory rate, oxygen                            saturations, blood pressure, adequacy of pulmonary                            ventilation, and response to care were monitored                            throughout the procedure. The physical status of the                            patient was re-assessed after the procedure.                           After obtaining informed consent, the endoscope was                            passed under direct vision. Throughout the                            procedure, the patient's blood pressure, pulse, and  oxygen saturations were monitored continuously. The                            Endoscope was introduced through the mouth, and                            advanced to the second part of duodenum. The                            Endoscope was introduced through the mouth, and                            advanced to the second part of duodenum. The upper                            EUS was accomplished without difficulty. The patient                            tolerated the procedure well.                                                                                   Findings:       ENDOSCOPIC FINDING: :       The examined esophagus was normal.       A single localized diminutive erosion with no bleeding and no stigmata        of recent bleeding was found in the gastric antrum. Biopsies were taken        with a cold forceps for  Helicobacter pylori testing.       The examined duodenum was normal.       The major papilla was normal.       ENDOSONOGRAPHIC FINDING: :       Pancreatic parenchymal abnormalities were noted in the uncinate process        of the pancreas. These consisted of hyperechoic strands and lobularity.       The pancreatic duct had a regular endosonographic appearance in the genu        of the pancreas. The pancreatic duct measured up to 3 mm in diameter.       The pancreatic duct had a prominently branched endosonographic        appearance in the body of the pancreas and tail of the pancreas. The        pancreatic duct measured up to 2 mm in diameter.       A small amount of hyperechoic material consistent with sludge was        visualized endosonographically in the gallbladder.  Estimated Blood Loss:       Estimated blood loss: none.  Impression:              - Normal esophagus.                           - Erosive gastropathy with no bleeding and no                            stigmata of recent bleeding. Biopsied.                           - Normal examined duodenum.                           - Normal major papilla.                           - Pancreatic parenchymal abnormalities consisting of                            hyperechoic strands and lobularity were noted in the                            uncinate process of the pancreas.                           - The pancreatic duct had a regular endosonographic                            appearance in the genu of the pancreas. The                            pancreatic duct measured up to 3 mm in diameter.                           - The pancreatic duct had a prominently branched                            endosonographic appearance in the body of the                            pancreas and tail of the pancreas. The pancreatic                            duct measured up to 2 mm in diameter.                            - Hyperechoic material consistent with sludge was                            visualized endosonographically in the gallbladder.  Recommendation:          - Discharge patient to home.                           -  Resume previous diet.                           - Await path results.                           - Return to referring provider as previously                            scheduled.                                                                                   Attending Participation: I personally performed the entire procedure.  Procedure Code(s):       --- Professional ---                           [phone], Esophagogastroduodenoscopy, flexible,                            transoral; with endoscopic ultrasound examination                            limited to the esophagus, stomach or duodenum, and                            adjacent structures                           43239, Esophagogastroduodenoscopy, flexible,                            transoral; with biopsy, single or multiple  Diagnosis Code(s):       --- Professional ---       K31.89, Other diseases of stomach and duodenum       K86.9, Disease of pancreas, unspecified       K86.1, Other chronic pancreatitis       R10.9, Unspecified abdominal pain       R93.3, Abnormal findings on diagnostic imaging of other parts of        digestive tract       K83.8, Other specified diseases of biliary tract                                                                                     CPT copyright 2023 [employer]. All rights reserved.    The codes documented in this report are preliminary and upon coder review may   be revised to meet current compliance requirements.    Dr. Juri Dinning  [facility] [place]  _______________  [name] Beckmann, MD  05/23/2024 2:43:05 PM  Total Procedure Duration Time 0 hours 13 minutes 46 seconds   Scope In: 2:20:02 PM  Scope Out: 2:33:48 PM

## 2024-05-23 NOTE — H&P (Signed)
 GI HISTORY & PHYSICAL    05/23/2024  2:02 PM    Joanna Lopez is an 74 y.o. female.    Active Problems:    * No active hospital problems. *      History:  EPI and H/o H. pylori      Past Medical History:   Diagnosis Date    Anxiety     Basal cell cancer     Cataract     Hyperlipidemia     Hypertension     Lung cancer (CMS-HCC)     OSA (obstructive sleep apnea)     RLS (restless legs syndrome)     Stroke (CMS-HCC)     5 yrs ago, TIA X 2     Past Surgical History:   Procedure Laterality Date    APPENDECTOMY      CATARACT EXTRACTION, BILATERAL Bilateral 07/2021    COLONOSCOPY N/A 01/23/2013    Procedure: COLONOSCOPY WITH MAC;  Surgeon: Rankin Dellen, MD;  Location: [facility] ENDOSCOPY;  Service: Gastroenterology;  Laterality: N/A;    COLONOSCOPY N/A 02/15/2024    Procedure: COLONOSCOPY W/ OR W/O BIOPSY;  Surgeon: [name] CHRISTELLA Beckmann, MD;  Location: [facility] ENDOSCOPY;  Service: Gastroenterology;  Laterality: N/A;    FLEXIBLE BRONCHOSCOPY W/ UPPER ENDOSCOPY N/A 09/17/2016    Procedure: BRONCHOSCOPY SUPER D WITH FLUORO AND BIOPSY-EBUS/NAVI;  Surgeon: Sadia Benzaquen, MD;  Location: [facility] ENDOSCOPY;  Service: Pulmonary;  Laterality: N/A;    INSERTION CATHETER VASCULAR ACCESS N/A 12/26/2021    Procedure: INSERTION POWER PORT A CATH;  Surgeon: Lucendia Blanch, MD;  Location: [facility] OR;  Service: General;  Laterality: N/A;    LOOP IMPLANT IMPLANTATION N/A 04/07/2017    Procedure: Loop  Implantation;  Surgeon: Lue Legions, MD;  Location: [facility] ELECTROPHYSIOLOGY LAB;  Service: Electrophysiology;  Laterality: N/A;    LOOP REMOVAL Left 04/09/2021    Procedure: Loop Removal;  Surgeon: Rory Osier, MD;  Location: [facility] SUITE;  Service: Electrophysiology;  Laterality: Left;    submastoid gland removal      THORACOSCOPY Right 11/23/2016    Procedure: RIGHT SIDED VATS MIDDLE LOBE LOBECTOMY,HILAR MEDIASTINAL LYMPH NODE DISSECTION;  Surgeon: Nena Humbles, MD;  Location: [facility] OR;  Service: Thoracic;  Laterality: Right;    THORACOSCOPY  LOBECTOMY VIDEO ASSISTED Left 11/18/2021    Procedure: Left Thoracoscopic Lingulectomy;  Surgeon: Nena Humbles, MD;  Location: [facility] OR;  Service: General;  Laterality: Left;    TONSILLECTOMY      UVULOPALATOPHARYNGOPLASTY       Family History   Adopted: Yes   Problem Relation Age of Onset    Diabetes Son      Social History[1]  Allergies[2]    Prior to Admission Meds:  Home Medications   Medication Sig Taking? Last Dose   aspirin  325 MG tablet Take 1 tablet (325 mg total) by mouth daily. Yes 05/22/2024   atorvastatin  (LIPITOR ) 80 MG tablet Take 1 tablet (80 mg total) by mouth every morning. Yes 05/22/2024   BIOTIN ORAL Take 1 tablet by mouth daily. Yes Past Week   busPIRone  (BUSPAR ) 10 MG tablet Take 1 tablet (10 mg total) by mouth in the morning and at bedtime. Yes 05/22/2024   dicyclomine  (BENTYL ) 10 MG capsule Take 1 capsule (10 mg total) by mouth every 8 hours as needed. Yes Past Week   gabapentin  (NEURONTIN ) 300 MG capsule Take 1 capsule (300 mg total) by mouth 3 times a day. Yes 05/23/2024   leucovorin-pyridox-mecobalamin (FOLINIC-PLUS) 4-50-2 mg Tab Take 1 tablet  by mouth daily. Yes Past Week   lipase -protease -amylase , pork, (CREON ) 24,000-76,000 -120,000 unit CpDR Take 3 capsules by mouth 3 times a day as needed (for abdominal cramping). Take prior to meals Yes Past Month   lisinopriL  (PRINIVIL ) 10 MG tablet Take 1 tablet (10 mg total) by mouth daily. Yes 05/22/2024   loperamide  (IMODIUM ) 2 mg capsule Take 1-2 capsules (2-4 mg total) by mouth 4 times a day as needed for Diarrhea. Take 4 mg (2 capsules) after the first loose stool and then 1 capsule for each loose stool thereafter Yes Past Month   MELATONIN ORAL Take 10 mg by mouth at bedtime as needed (sleep). Yes 05/22/2024   multivitamin (THERAGRAN) tablet Take 1 tablet by mouth daily. Yes 05/22/2024   ondansetron  (ZOFRAN ) 8 MG tablet Take 1 tablet (8 mg total) by mouth every 8 hours as needed for Nausea. Yes Past Month   PARoxetine  (PAXIL ) 20 MG tablet Take 1  tablet (20 mg total) by mouth at bedtime. Yes 05/22/2024   roPINIRole  (REQUIP ) 1 MG tablet Take 1 tablet (1 mg total) by mouth at bedtime. Indications: Restless Legs Syndrome Yes 05/22/2024   levETIRAcetam  ER (KEPPRA  XR) 500 mg 24 hr tablet Take 2 tablets (1,000 mg total) by mouth daily.  Patient not taking: No sig reported     polyethylene glycol (GOLYTELY ) 236-22.74-6.74 -5.86 gram solution Please take your prep according to the instructions outlined in your prep letter in messages and communications.  Patient not taking: Reported on 05/22/2024         Review of Systems    Vitals:  Blood pressure 106/72, pulse 96, temperature 98.2 ??F (36.8 ??C), temperature source Oral, resp. rate 15, height 5' 6 (1.676 m), weight 178 lb (80.7 kg), SpO2 96%.    Physical Exam    Labs:   Lab Results   Component Value Date    GLUCOSE 91 05/22/2024    BUN 46 (H) 05/22/2024    CO2 20 (L) 05/22/2024    CREATININE 2.57 (H) 05/22/2024    K 4.5 05/22/2024    NA 134 05/22/2024    CL 103 05/22/2024    CALCIUM  9.6 05/22/2024     Lab Results   Component Value Date    MG 1.7 09/17/2023     Lab Results   Component Value Date    PHOS 4.9 (H) 05/22/2024     Lab Results   Component Value Date    WBC 10.9 (H) 05/22/2024    HGB 14.1 05/22/2024    HCT 40.0 05/22/2024    MCV 91.8 05/22/2024    PLT 205 05/22/2024     Lab Results   Component Value Date    INR 0.9 09/08/2023       Assessment/Plan:  EGD/EUS    Yovanni Frenette CHRISTELLA BECKMANN, MD  05/23/2024       [1]   Social History  Tobacco Use    Smoking status: Never    Smokeless tobacco: Never   Vaping Use    Vaping status: Never Used   Substance Use Topics    Alcohol use: Not Currently     Comment: occasionally    Drug use: No   [2]   Allergies  Allergen Reactions    Penicillins Anaphylaxis     Facial Swelling  immediate rash, facial/tongue/throat swelling, SOB or lightheadedness with hypotension,   severe rash involving mucus membranes or skin necrosis    Adhesive      Other reaction(s): breaks out and itches if  on for  long time    Ciprofloxacin  Other (See Comments)     Tendonitis.     Formaldehyde Analogues And Releasers     Nsaids (Non-Steroidal Anti-Inflammatory Drug)      GI issues    Promethazine  Hcl      Restless legs    Latex Rash    Sulfa (Sulfonamide Antibiotics) Rash

## 2024-05-23 NOTE — Post Briefing (Signed)
 Endoscopy  Post Procedure Briefing Note: Joanna Lopez      Specimens:   Specimens       ID Source Type Tests Collected By Collected At Frozen? Attributes Order ID Breast Spec Formalin Marked as Sent    A Stomach Tissue SURGICAL PATHOLOGY EXAM   [name] CHRISTELLA Beckmann, MD 05/23/24 1422 No Sent in Formalin [id number]        Comment: R/O H. Pylori            Prior to leaving the room the nurse confirmed procedure, specimen labeling, documentation of deployables, any equipment issues, and diagnosis with MD. EGD/EUS complete. Report called. Patient to recover with same day nurse. VSS   Are there any recovery management concerns or post procedure orders?   No      Other Comments: balloon intact at end of procedure.      Signed: Mayola Mcbain, RN    Date: 05/23/2024    Time: 2:33 PM

## 2024-05-23 NOTE — Progress Notes (Signed)
 Pt. Discharged per wheelchair to exit.    Discharge Pain level- 0/10 Patient states no pain  PIV removed  Tolerating PO intake    Patient and family verbalized understanding of discharge instructions.      Vitals:    05/23/24 1500   BP: 117/74   Pulse: 71   Resp:    Temp:    SpO2: 98%             I/O this shift:  In: 700 [I.V.:700]  Out: -       Deontrae Drinkard, RN    Script none

## 2024-05-24 ENCOUNTER — Ambulatory Visit: Admit: 2024-05-24 | Discharge: 2024-06-02 | Payer: MEDICARE | Attending: Family

## 2024-05-24 DIAGNOSIS — K7689 Other specified diseases of liver: Principal | ICD-10-CM

## 2024-05-24 LAB — FIBROSCAN
Controlled Attenuation Parameter (CAP): 226
Liver Stiffness (E): 3.1

## 2024-05-24 NOTE — Procedures (Signed)
 PROCEDURE NOTE    Procedure: FibroScan    Anesthesia: Not Applicable    Method: The patient was positioned in the supine position with the right arm over the head. The liver was percussed and an appropriate site identified. The FibroScan XL probe was utilized in the intercostal space. 10 or more readings were obtained.    Result:  3.1  kPascals with IQR/M=8%    Interpretation: Technically adequate procedure. This degree of liver stiffness is associated with no or mild hepatic fibrosis (Metavir F0/1).    CAP:  226 dB/M    Interpretation: A low CAP value suggests no clinically significant steatosis is present.     Echosens interpretation guide used:  MASLD/MASH

## 2024-05-26 NOTE — Telephone Encounter (Signed)
 Spoke with patient, relayed results, she verbalized understanding, scheduled follow-up appt with Asberry Shawl CNP on 06/28/24 at 9:00am.

## 2024-06-05 ENCOUNTER — Ambulatory Visit: Payer: MEDICARE | Attending: Surgical Oncology

## 2024-06-09 ENCOUNTER — Ambulatory Visit: Admit: 2024-06-09 | Discharge: 2024-06-09 | Payer: MEDICARE

## 2024-06-09 DIAGNOSIS — C3412 Malignant neoplasm of upper lobe, left bronchus or lung: Principal | ICD-10-CM

## 2024-06-09 NOTE — Progress Notes (Signed)
 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 11/18/21: Left VATS lingulectomy  2. 01/02/22 - 03/09/22: Adjuvant cisplatin , pemetrexed  (C2+ dose reduced due to intolerance - n/v/fatigue)  3. 10/06/22 - 05/20/23: Adjuvant Alectinib PO (dose reduced multiple times due to nausea, constipation/diarrhea, fatigue, tendinopathy/myopathy, headaches)    ECOG PERFORMANCE STATUS: (1) Restricted in physically strenuous activity but ambulatory and able to carry out work of a light or sedentary nature, e.g., light house work, office work      INTERVAL HISTORY:  Joanna Lopez is here for follow up of her lung cancer. She has been working with her teams on her abdominal pain still. No new breathing issues. Energy is good. Eating and drinking well.       MEDICATIONS:  Current Outpatient Medications   Medication Sig    aspirin  Take 1 tablet (325 mg total) by mouth daily.    atorvastatin  Take 1 tablet (80 mg total) by mouth every morning.    BIOTIN ORAL Take 1 tablet by mouth  daily.    buspirone  Take 1 tablet (10 mg total) by mouth in the morning and at bedtime.    dicyclomine  Take 1 capsule (10 mg total) by mouth every 8 hours as needed.    gabapentin  Take 1 capsule (300 mg total) by mouth 3 times a day.    Folinic-Plus Take 1 tablet by mouth daily.    Creon  Take 3 capsules by mouth 3 times a day as needed (for abdominal cramping). Take prior to meals    lisinopril  Take 1 tablet (10 mg total) by mouth daily.    loperamide  Take 1-2 capsules (2-4 mg total) by mouth 4 times a day as needed for Diarrhea. Take 4 mg (2 capsules) after the first loose stool and then 1 capsule for each loose stool thereafter    MELATONIN ORAL Take 10 mg by mouth at bedtime as needed (sleep).    multivitamin Take 1 tablet by mouth daily.    ondansetron  Take 1 tablet (8 mg total) by mouth every 8 hours as needed for Nausea.    PARoxetine  Take 1 tablet (20 mg total) by mouth at bedtime.    roPINIRole  Take 1 tablet (1 mg total) by mouth at bedtime. Indications: Restless Legs Syndrome    levETIRAcetam  ER Take 2 tablets (1,000 mg total) by mouth daily. (Patient not taking: Reported on 06/09/2024)  polyethylene glycol Please take your prep according to the instructions outlined in your prep letter in messages and communications. (Patient not taking: Reported on 06/09/2024)     No current facility-administered medications for this visit.       ALLERGIES:    Penicillins, Adhesive, Ciprofloxacin , Formaldehyde analogues and releasers, Nsaids (non-steroidal anti-inflammatory drug), Promethazine  hcl, Latex, and Sulfa (sulfonamide antibiotics)    REVIEW OF SYSTEMS:  Review of Systems   Constitutional:  Negative for chills, fever, malaise/fatigue and weight loss.   HENT:  Negative for congestion, hearing loss, sore throat and tinnitus.    Eyes:  Negative for blurred vision and double vision.   Respiratory:  Negative for cough, hemoptysis and shortness of breath.    Cardiovascular:  Negative for chest pain, palpitations and  leg swelling.   Gastrointestinal:  Positive for abdominal pain. Negative for constipation, diarrhea, nausea and vomiting.   Genitourinary:  Negative for dysuria, flank pain and urgency.   Musculoskeletal:  Negative for joint pain, myalgias and neck pain.   Skin:  Negative for itching and rash.   Neurological:  Positive for sensory change. Negative for dizziness, weakness and headaches.   Psychiatric/Behavioral:  Negative for depression, substance abuse and suicidal ideas. The patient is not nervous/anxious.    All other systems reviewed and are negative.      PHYSICAL EXAMINATION:  Vitals:    06/09/24 1043   BP: 131/83   BP Location: Right upper arm   Pulse: 81   Temp: 97.7 ??F (36.5 ??C)   TempSrc: Temporal   SpO2: 96%   Weight: 181 lb 6.4 oz (82.3 kg)   Height: 5' 6 (1.676 m)        Body surface area is 1.96 meters squared.    Physical Exam  Vitals reviewed.   Constitutional:       General: She is not in acute distress.     Appearance: Normal appearance. She is normal weight. She is not ill-appearing.   HENT:      Head: Normocephalic and atraumatic.      Nose: Nose normal.      Mouth/Throat:      Mouth: Mucous membranes are moist.      Pharynx: Oropharynx is clear.   Eyes:      General: No scleral icterus.     Extraocular Movements: Extraocular movements intact.      Conjunctiva/sclera: Conjunctivae normal.   Cardiovascular:      Rate and Rhythm: Normal rate.      Pulses: Normal pulses.   Pulmonary:      Effort: Pulmonary effort is normal. No respiratory distress.   Abdominal:      General: Abdomen is flat. There is no distension.   Musculoskeletal:         General: No swelling or tenderness. Normal range of motion.      Cervical back: Normal range of motion.   Skin:     General: Skin is warm and dry.      Coloration: Skin is not jaundiced.      Findings: No rash.   Neurological:      General: No focal deficit present.      Mental Status: She is alert and oriented to person, place, and time. Mental status is at  baseline.      Cranial Nerves: No cranial nerve deficit.      Gait: Gait normal.   Psychiatric:         Mood and Affect: Mood normal.  Behavior: Behavior normal.         Thought Content: Thought content normal.       LABORATORY DATA:  Lab Results   Component Value Date    WBC 10.9 (H) 05/22/2024    RBC 4.35 05/22/2024    HGB 14.1 05/22/2024    HCT 40.0 05/22/2024    MCV 91.8 05/22/2024    MCH 32.4 05/22/2024    MCHC 35.2 05/22/2024    RDW 14.4 05/22/2024    PLT 205 05/22/2024        Lab Results   Component Value Date    GLUCOSE 91 05/22/2024    BUN 46 (H) 05/22/2024    CO2 20 (L) 05/22/2024    CREATININE 2.57 (H) 05/22/2024    K 4.5 05/22/2024    NA 134 05/22/2024    CL 103 05/22/2024    CALCIUM  9.6 05/22/2024    ALBUMIN 4.5 05/22/2024    PROT 6.8 05/18/2024    ALKPHOS 61 05/18/2024    ALT 21 05/18/2024    AST 32 05/18/2024    BILITOT 0.5 05/18/2024     Lab Results   Component Value Date    TSH 2.12 02/10/2024        RADIOLOGY REVIEW:  Images personally reviewed and compared to priors  CT Abdomen and Pelvis With IV contrast  Result Date: 05/22/2024  IMPRESSION: Stable hepatic cysts. The cystic lesion in segment 3 seen on the study dated 09/13/2023 is no longer present. Report Verified by: Willam Severin, MD at 05/22/2024 9:58 AM EST    X-ray Abdomen AP view  Result Date: 05/17/2024  IMPRESSION:  1.  No plain radiographic evidence of acute abnormality. Report Verified by: MARLA Darin Sheer, MD at 05/17/2024 2:33 PM EST    CT Abdomen and Pelvis With IV contrast  Result Date: 04/18/2024  IMPRESSION: No CT evidence of acute abnormality in the abdomen and pelvis. One of the larger cysts seen in the left hepatic lobe on the prior CT is no longer present, reflecting interval rupture. Colonic diverticulosis. Report Verified by: MARLA Darin Sheer, MD at 04/18/2024 10:42 AM EST    CT Pulmonary Angiography  Result Date: 04/18/2024  IMPRESSION: 1.  No acute pulmonary embolism. 2.  Postsurgical changes of right middle lobectomy and  lingulectomy without evidence of recurrent or metastatic disease within chest. Approved by Anam Hussain, MD on 04/18/2024 10:29 AM EST I have personally reviewed the images and I agree with this report. Report Verified by: Danial Drone, MD at 04/18/2024 10:39 AM EST    X-ray Chest PA and Lateral  Result Date: 04/18/2024  IMPRESSION: No acute cardiopulmonary abnormality. Bilateral lung surgery. 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  - She had significant toxicity despite multiple dose reductions to alectinib and stopped this a few months ago now and is doing much better.   - Will monitor off treatment in surveillance ongoing. Will plan for new imaging in 3-6 months with new CT scan.   - RTC in 3-6 months or sooner if needed with scan.       2) Neuropathy  - Taking Gabapentin  300 mg po daily - very helpful for neurological pain. Denies neurological pain at this time.     Any elements of  the HPI and Assessment/Plan copied from prior notes have been updated where appropriate and all reflect current medical decision making from today, 06/09/2024. Physical examination listed above was completed in entirety today, 06/09/2024.    Lonni Failing, MD  Thoracic and Head & Neck Oncology  [facility]

## 2024-06-12 ENCOUNTER — Ambulatory Visit: Admit: 2024-06-12 | Payer: MEDICARE | Attending: Surgical Oncology

## 2024-06-12 DIAGNOSIS — K7689 Other specified diseases of liver: Principal | ICD-10-CM

## 2024-06-12 NOTE — Progress Notes (Signed)
 Subjective   History of Present Illness:   Joanna Lopez is a 74 y.o. female    HPI   with multifocal liver cyst. PMH of lung cancer x 2, she has a history of stroke and TIA. Patient initially seen in office 10/25/23 where observation was recommended. Patient returns for follow up with imaging. She returns for follow up of her liver cyst.  Recent results from EUS noted.  She feels well, eating, active.  No abdominal pain.  CT scan with stable liver cyst, some improved.    We reviewed the results.    Histories:     She has a past medical history of Anxiety, Basal cell cancer, Cataract, Hyperlipidemia, Hypertension, Lung cancer (CMS-HCC), OSA (obstructive sleep apnea), RLS (restless legs syndrome), and Stroke (CMS-HCC).    She has a past surgical history that includes Appendectomy; submastoid gland removal; Uvulopalatopharyngoplasty; Tonsillectomy; Colonoscopy (N/A, 01/23/2013); Flexible bronchoscopy w/ upper endoscopy (N/A, 09/17/2016); Thoracoscopy (Right, 11/23/2016); Loop Implant  Implantation (N/A, 04/07/2017); Loop Removal (Left, 04/09/2021); Cataract extraction, bilateral (Bilateral, 07/2021); thoracoscopy lobectomy video assisted (Left, 11/18/2021); insertion catheter vascular access (N/A, 12/26/2021); Colonoscopy (N/A, 02/15/2024); upper eus (N/A, 05/23/2024); Esophagogastroduodenoscopy (N/A, 05/23/2024); and fibroscan (05/24/2024).    Her family history includes Diabetes in her son. She was adopted.    She reports that she has never smoked. She has never used smokeless tobacco. She reports that she does not currently use alcohol. She reports that she does not use drugs.      Review of Systems   Constitutional:  Negative for activity change, appetite change, chills, diaphoresis, fatigue and fever.   HENT:  Negative for facial swelling.    Eyes:  Negative for pain, discharge and itching.   Respiratory:  Negative for cough, choking, chest tightness, shortness of breath, wheezing and stridor.     Cardiovascular:  Negative for chest pain, palpitations and leg swelling.   Gastrointestinal:  Negative for abdominal distention, abdominal pain, anal bleeding, bloating, blood in stool, constipation, diarrhea, heartburn, nausea, rectal pain and vomiting.   Musculoskeletal:  Negative for arthralgias, back pain and neck pain.   Neurological:  Negative for dizziness.   All other systems reviewed and are negative.      Allergies:   Penicillins, Adhesive, Ciprofloxacin , Formaldehyde analogues and releasers, Nsaids (non-steroidal anti-inflammatory drug), Promethazine  hcl, Latex, and Sulfa (sulfonamide antibiotics)    Medications:   Encounter Medications[1]    Objective   There were no vitals taken for this visit.    Physical Exam  Vitals and nursing note reviewed.   Constitutional:       Appearance: Normal appearance. She is normal weight.   HENT:      Head: Normocephalic and atraumatic.      Right Ear: External ear normal.      Left Ear: External ear normal.      Nose: Nose normal.   Eyes:      Extraocular Movements: Extraocular movements intact.      Conjunctiva/sclera: Conjunctivae normal.   Cardiovascular:      Rate and Rhythm: Normal rate.   Pulmonary:      Effort: Pulmonary effort is normal.   Abdominal:      General: Abdomen is flat. There is no distension.      Palpations: Abdomen is soft.      Tenderness: There is no abdominal tenderness. There is no guarding.   Musculoskeletal:         General: Normal range of motion.      Cervical  back: Normal range of motion.   Skin:     General: Skin is warm and dry.      Coloration: Skin is not jaundiced.      Findings: No bruising or erythema.   Neurological:      General: No focal deficit present.      Mental Status: She is alert and oriented to person, place, and time. Mental status is at baseline.   Psychiatric:         Mood and Affect: Mood normal.         Behavior: Behavior normal.         Thought Content: Thought content normal.         Judgment: Judgment normal.        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 female with liver cyst, benign.   Plan   We reviewed the results  Will follow up prn  She will follow up with GI    I have reviewed the ROS.                [1]  Outpatient Encounter Medications as of 06/12/2024   Medication Sig Dispense Refill   ??? aspirin  325 MG tablet Take 1 tablet (325 mg total) by mouth daily. 30 tablet 0   ??? atorvastatin  (LIPITOR ) 80 MG tablet Take 1 tablet (80 mg total) by mouth every morning.     ??? BIOTIN ORAL Take 1 tablet by mouth daily.     ??? busPIRone  (BUSPAR ) 10 MG tablet Take 1 tablet (10 mg total) by mouth in the morning and at bedtime.     ??? dicyclomine  (BENTYL ) 10 MG capsule Take 1 capsule (10 mg total) by mouth every 8 hours as needed. 30 capsule 0   ??? gabapentin  (NEURONTIN ) 300 MG capsule Take 1 capsule (300 mg total) by mouth 3 times a day. 90 capsule 1   ??? leucovorin-pyridox-mecobalamin (FOLINIC-PLUS) 4-50-2 mg Tab Take 1 tablet by  mouth daily.     ??? levETIRAcetam  ER (KEPPRA  XR) 500 mg 24 hr  tablet Take 2 tablets (1,000 mg total) by mouth daily. (Patient not taking: Reported on 06/09/2024) 180 tablet 1   ??? lipase -protease -amylase , pork, (CREON ) 24,000-76,000 -120,000 unit CpDR Take 3 capsules by mouth 3 times a day as needed (for abdominal cramping). Take prior to meals 400 capsule 6   ??? lisinopriL  (PRINIVIL ) 10 MG tablet Take 1 tablet (10 mg total) by mouth daily.     ??? loperamide  (IMODIUM ) 2 mg capsule Take 1-2 capsules (2-4 mg total) by mouth 4 times a day as needed for Diarrhea. Take 4 mg (2 capsules) after the first loose stool and then 1 capsule for each loose stool thereafter     ??? MELATONIN ORAL Take 10 mg by mouth at bedtime as needed (sleep).     ??? multivitamin (THERAGRAN) tablet Take 1 tablet by mouth daily.     ??? ondansetron  (ZOFRAN ) 8 MG tablet Take 1 tablet (8 mg total) by mouth every 8 hours as needed for Nausea. 30 tablet 3   ??? PARoxetine  (PAXIL ) 20 MG tablet Take 1 tablet (20 mg total) by mouth at bedtime.     ??? polyethylene glycol (GOLYTELY ) 236-22.74-6.74 -5.86 gram solution Please take your prep according to the instructions outlined in your prep letter in messages and communications. (Patient not taking: Reported on 06/09/2024) 4000 mL 0   ??? roPINIRole  (REQUIP ) 1 MG tablet Take 1 tablet (1 mg total) by mouth at bedtime. Indications: Restless Legs Syndrome       No facility-administered encounter medications on file as of 06/12/2024.

## 2024-06-12 NOTE — Addendum Note (Signed)
 Addended byBETHA CLEVE DORTHY MARLA. on: 06/12/2024 08:26 AM     Modules accepted: Orders

## 2024-06-16 ENCOUNTER — Ambulatory Visit: Payer: MEDICARE

## 2024-06-16 NOTE — Progress Notes (Unsigned)
 [facility]  Neurology Department  Epilepsy Clinic      Subjective     HPI: Joanna Lopez is a 74 y.o. White or Caucasian right handed female with PMH basal cell carcinoma, lung cancer, anxiety, HTN, HLD presenting for further evaluation of episodes she had in 2017. She was in the car with her husband driving when she had hearing loss in the right ear and hearing half gone in the left, lost taste (tasted metallic), smell, touch in fingers of both hands, and the right eye deviated inward. She was hospitalized for stroke workup but stroke workup was negative. These symptoms continued over weeks to months with gradual reduction in intensity. Then she had a minor one after that had disorientation feeling, touch, smell, taste and lasted for 10 days before symptoms went away. She does not recall exactly when it happened as they occurred a long time ago. She also reports seeing four after cancer surgery 2 years ago that went away after a few hours. Denies episodes of disorientation, slow motion or changes in sensation in the past year. She also denies any loss of consciousness, urinary incontinence or tongue biting.     Had surgery for both basal cell and lung cancer. Basal cell was located above the lip. She had 2 lung cancers that were different types and had surgery and chemotherapy (second one) and oral chemotherapy. For the second cancer she had 8-10 infusions twice per week. Last infusion was 2 years ago. Symptoms of what she thought was a stroke occurred before she was diagnosed with lung cancer. She is now in remission. Has had sleep apnea but had surgery.     Risk factors: The patient has no history of febrile seizures, meningitis, encephalitis, head injuries with LOC, or family members with seizures.    Seizure/Spell description: type I: disorientation sensation, hearing loss in the right ear and hearing half gone in the left, lost taste (tasted metallic), smell, touch  in fingers of both hands, and the right eye deviated inward  Type II: sits, feels in a stupor, difficulty understanding all words spoken but able to respond, feels like things are slower, aware through the entire episode, lasts 30-45 minutes   Date of last seizure: March 2025 (type II), 2017 (type I)  Frequency of seizures: had 3 episodes in 3 months but none for 6 months  Triggers: none   Current AEDs: none  Current Adverse Effects: na  Past AEDs: none   MRI: 10/27/2008 - unremarkable  02/09/2023:Prior right cerebellar and bilateral internal capsular lacunar infarcts.   01/17/2024: Remote lacunar infarct in right cerebellum and remote microhemorrhage in left cerebellum, stable   EEG: routine 02/03/2024 - left temporal slowing   Onset: 2017  Risk Factors: denies any risk factors but paternal family history unknown   Driving: yes   Handedness: right     3 years of college and then got a 2 year degree in writing code   Lives by herself. She was married but husband passed away.   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 She reports concerns of these being TIAs and requested a referral from her pulmonologist to discuss with a neurology provider. Last episode was in March of 2025     Today's history (10/26/2023):    2017 - had an episode -  she was in car, developed visual changes - 4-5 images instead of one.  Lost hearing on left ear, impaired hearing on right ear.  Metallic taste and smell, cognitively slow, slow talking.  Metalic taste /smell resolved quickly, cognitive slowing / slowed speech was back to normal within a few weeks.  Vision / hearing returned over 4-32months.         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      More recently, her metallic taste has returned, slow motion, not responding to people well - spaced out.  No weakness, no numbness, no dysarthria.  People witnessed and said something's wrong with you.  spells lasted 30-40m of cog impairment.  She was placed back in her car and drove home (less than a block away).  Does not recall headache with these epidoses.  Went to bed and awoke fine the next day.       Has history of headaches.  Had cognitive impairment during these headaches as well.       3-4x over past 6-28mo.      Follow-up 02/10/2024:  Patient reports 3 episodes since December 2024:  Christmas - felt really weird, sat down, people were jerkily moving around, wanted to get salt and just sat, could not understand someone talking to her, she was asked if she was okay and she said yeah. She was able to understand bits and pieces, could hear but could not quite get all the words. Said she sat in a stupor and could not keep up with what was happening. It lasted 1-3 hours before completely gone. She was aware through the whole episode  Feb - She was at home and she sat on her couch and stared around for 30-45 minutes. She came out then went and took a nap. She was home alone and aware through the whole episode.   March - driving and sat until it went away - it was similar to the one on Feb but lasted 30 minutes   Since  March feeling in the fingers - feels like she is wearing plastic gloves in the fingers of both hands. It lasts for 30 minutes or less and maybe had 2 or 3.     She denies hearing loss but she felt things were slower. She does not recall having any metallic taste.   Past Medical History     Past Medical History:   Diagnosis Date    Anxiety     Basal cell cancer     Cataract     Hyperlipidemia     Hypertension     Lung cancer (CMS-HCC)     OSA (obstructive sleep apnea)     RLS (restless legs syndrome)  Stroke (CMS-HCC)     5 yrs ago, TIA X 2     Past Surgical History:   Procedure Laterality Date    APPENDECTOMY      CATARACT EXTRACTION, BILATERAL Bilateral 07/2021    COLONOSCOPY N/A 01/23/2013    Procedure: COLONOSCOPY WITH MAC;  Surgeon: Rankin Dellen, MD;  Location: [facility] ENDOSCOPY;  Service: Gastroenterology;  Laterality: N/A;    COLONOSCOPY N/A 02/15/2024    Procedure: COLONOSCOPY W/ OR W/O BIOPSY;  Surgeon: [name] CHRISTELLA Beckmann, MD;  Location: [facility] ENDOSCOPY;  Service: Gastroenterology;  Laterality: N/A;    ESOPHAGOGASTRODUODENOSCOPY N/A 05/23/2024    Procedure: EGD WITH BIOPSY;  Surgeon: [name] CHRISTELLA Beckmann, MD;  Location: [facility] ENDOSCOPY;  Service: Gastroenterology;  Laterality: N/A;    FIBROSCAN  05/24/2024    FLEXIBLE BRONCHOSCOPY W/ UPPER ENDOSCOPY N/A 09/17/2016    Procedure: BRONCHOSCOPY SUPER D WITH FLUORO AND BIOPSY-EBUS/NAVI;  Surgeon: Sadia Benzaquen, MD;  Location: [facility] ENDOSCOPY;  Service: Pulmonary;  Laterality: N/A;    INSERTION CATHETER VASCULAR ACCESS N/A 12/26/2021    Procedure: INSERTION POWER PORT A CATH;  Surgeon: Lucendia Blanch, MD;  Location: [facility] OR;  Service: General;  Laterality: N/A;    LOOP IMPLANT IMPLANTATION N/A 04/07/2017    Procedure: Loop  Implantation;  Surgeon: Lue Legions, MD;  Location: [facility] ELECTROPHYSIOLOGY LAB;  Service: Electrophysiology;  Laterality: N/A;    LOOP REMOVAL Left 04/09/2021    Procedure: Loop Removal;  Surgeon: Rory Osier, MD;  Location: [facility] MIS SUITE;  Service:  Electrophysiology;  Laterality: Left;    submastoid gland removal      THORACOSCOPY Right 11/23/2016    Procedure: RIGHT SIDED VATS MIDDLE LOBE LOBECTOMY,HILAR MEDIASTINAL LYMPH NODE DISSECTION;  Surgeon: Nena Humbles, MD;  Location: [facility] OR;  Service: Thoracic;  Laterality: Right;    THORACOSCOPY LOBECTOMY VIDEO ASSISTED Left 11/18/2021    Procedure: Left Thoracoscopic Lingulectomy;  Surgeon: Nena Humbles, MD;  Location: [facility] OR;  Service: General;  Laterality: Left;    TONSILLECTOMY      UPPER EUS N/A 05/23/2024    Procedure: UPPER EUS;  Surgeon: [name] CHRISTELLA Beckmann, MD;  Location: [facility] ENDOSCOPY;  Service: Gastroenterology;  Laterality: N/A;    UVULOPALATOPHARYNGOPLASTY         Family History     Family History   Adopted: Yes   Problem Relation Age of Onset    Diabetes Son        Social History     Social History[1]    Allergy     Allergies[2]    ROS     10-point review of systems completed  Constitutional: no significant weight loss or fever   Skin: no rashes   Head: no headaches or dizziness   Eyes: no vision loss or pain   ENT: no hearing loss, nasal discharge or congestion   Respiratory: no shortness of breath or cough   Cardiac: no chest pain or palpitations    GI: no abdominal pain or change in bowel habits   Musculoskeletal: No joint pain or muscle aches   Neurological: No new numbness, tingling, or weakness   Psychiatric: denies anxiety or depression     Medications     Home Medications   Medication Sig Taking? Last Dose   aspirin  325 MG tablet Take 1 tablet (325 mg total) by mouth daily.     atorvastatin  (LIPITOR ) 80 MG tablet Take 1 tablet (80 mg total) by mouth every morning.     BIOTIN ORAL Take 1 tablet by mouth daily.  busPIRone  (BUSPAR ) 10 MG tablet Take 1 tablet (10 mg total) by mouth in the morning and at bedtime.     dicyclomine  (BENTYL ) 10 MG capsule Take 1 capsule (10 mg total) by mouth every 8 hours as needed.     gabapentin  (NEURONTIN ) 300 MG capsule Take 1 capsule (300 mg total) by mouth 3 times  a day.     leucovorin-pyridox-mecobalamin (FOLINIC-PLUS) 4-50-2 mg Tab Take 1 tablet by mouth daily.     levETIRAcetam  ER (KEPPRA  XR) 500 mg 24 hr tablet Take 2 tablets (1,000 mg total) by mouth daily.  Patient not taking: Reported on 06/09/2024     lipase -protease -amylase , pork, (CREON ) 24,000-76,000 -120,000 unit CpDR Take 3 capsules by mouth 3 times a day as needed (for abdominal cramping). Take prior to meals     lisinopriL  (PRINIVIL ) 10 MG tablet Take 1 tablet (10 mg total) by mouth daily.     loperamide  (IMODIUM ) 2 mg capsule Take 1-2 capsules (2-4 mg total) by mouth 4 times a day as needed for Diarrhea. Take 4 mg (2 capsules) after the first loose stool and then 1 capsule for each loose stool thereafter     MELATONIN ORAL Take 10 mg by mouth at bedtime as needed (sleep).     multivitamin (THERAGRAN) tablet Take 1 tablet by mouth daily.     ondansetron  (ZOFRAN ) 8 MG tablet Take 1 tablet (8 mg total) by mouth every 8 hours as needed for Nausea.     PARoxetine  (PAXIL ) 20 MG tablet Take 1 tablet (20 mg total) by mouth at bedtime.     polyethylene glycol (GOLYTELY ) 236-22.74-6.74 -5.86 gram solution Please take your prep according to the instructions outlined in your prep letter in messages and communications.  Patient not taking: Reported on 06/09/2024     roPINIRole  (REQUIP ) 1 MG tablet Take 1 tablet (1 mg total) by mouth at bedtime. Indications: Restless Legs Syndrome         Physical Exam:     There were no vitals taken for this visit.  General: Alert and oriented to self, location and situation, NAD, cooperative  HEENT: NC/AT, EOMI, PERRL, MMM, no carotid bruits, neck supple, no meningeal signs  Fundoscopic: no disc edema appreciated  Heart: RRR, S1/S2  Lungs: symmetric motion of chest wall, no signs of respiratory distress  Abdomen: soft/NT/ND  Ext: no edema  Psych: affect and mood appropriate  Skin: no rashes or lesions    Neurologic Exam:  Mental Status: Alert and oriented to self, location and situation.  Good fund of knowledge, appropriate reasoning, able to think abstractly.     Language: Speech clear, language fluent, repetition and naming intact, follows commands appropriately.     Cranial Nerves II-XII: PERRL, VFF, no nystagmus, no gaze paresis, sensation V1-V3 intact b/l, muscles of facial expression symmetric, hearing intact to conversational tone, palate elevates symmetrically, shoulder elevation symmetric, tongue protrudes midline with movement side to side.     Motor exam: Strength 5/5 UE/LE's b/l, tone and bulk normal, no pronator drift  Strength (R/L motor scale)  UE:   Shoulder abductors 5/5   Arm flexors 5/5 Arm extensors 5/5   Wrist extensors 5/5 Wrist extensors 5/5   Finger flexors: 5/5 Finger extensors: 5/5  Interosseous 5/5    LE:   Hip flexors 5/5  Leg flexors 5/5 Leg extensors 5/5  Dorsoflexors 5/5 Plantarflexors 5/5     Sensory: intact diffusely to temp, light touch and vibratory touch; proprioception intact    Deep Tendon  Reflexes: 2+ b/l in biceps, brachioradialis, triceps, patella, and achilles; toes   downgoing b/l; neg. Hoffman's, no clonus   (R/L)  Biceps 2/2  Brachioradialis 2/2  Triceps 2/2  Quadriceps 2/2  Achilles 2/2  Toes: down/down   Hoffman: neg/neg  Clonus: none/none    Coordination/Cerebellum: No dysmetria, dysdiadochokinesia, or ataxia noted; no tremor    Gait: normal gait and base w/ standard walk and tandem walk        LABS     Lab Results   Component Value Date    GLUCOSE 91 05/22/2024    BUN 46 (H) 05/22/2024    CO2 20 (L) 05/22/2024    CREATININE 2.57 (H) 05/22/2024    K 4.5 05/22/2024    NA 134 05/22/2024    CL 103 05/22/2024    CALCIUM  9.6 05/22/2024     No results found for: GLUFAST  Lab Results   Component Value Date    WBC 10.9 (H) 05/22/2024    HGB 14.1 05/22/2024    HCT 40.0 05/22/2024    MCV 91.8 05/22/2024    PLT 205 05/22/2024     Lab Results   Component Value Date    INR 0.9 09/08/2023     Lab Results   Component Value Date    CHOLTOT 174 02/22/2017    TRIG 249  (H) 02/22/2017    HDL 41 (L) 02/22/2017    LDL 83 02/22/2017     Lab Results   Component Value Date    HGBA1C 5.7 (H) 02/10/2024     Lab Results   Component Value Date    GLUCCSF 56 02/22/2017       Scans:  No results found.      ASSESSMENT/PLAN :     MARABELLA POPIEL is a 74 y.o. White or Caucasian right handed female with PMH basal cell carcinoma, lung cancer, anxiety, HTN, HLD presenting for further evaluation of episodes she had in 2017. She had 2 episodes around 2017 and 3 episodes in between December 2024 and March 2025. MRI brain with no new findings. EEG with left temporal slowing which raises some concern for structural abnormality but not definitive evidence for having seizures. Discussed starting a seizure medication to assess if any improvement in episodes.     Plan:  - start Keppra  XR 1000 mg daily  - consider EMU if episodes recur or become more frequent   - check vitamin B6, B12, TSH, HbA1c to assess for neuropathy    Patient was seen and discussed with the attending physician, Dr. Oneil Right, who is in agreement with the above assessment and plan.    RTC 3 months     Joanna EVERITT BIBLES, MD  Epilepsy Fellow, PGY-6  8:24 PM  06/15/2024            [1]   Social History  Tobacco Use    Smoking status: Never    Smokeless tobacco: Never   Vaping Use    Vaping status: Never Used   Substance Use Topics    Alcohol use: Not Currently     Comment: occasionally    Drug use: No   [2]   Allergies  Allergen Reactions    Penicillins Anaphylaxis     Facial Swelling  immediate rash, facial/tongue/throat swelling, SOB or lightheadedness with hypotension,   severe rash involving mucus membranes or skin necrosis    Adhesive      Other reaction(s): breaks out and itches if on for long time  Ciprofloxacin  Other (See Comments)     Tendonitis.     Formaldehyde Analogues And Releasers     Nsaids (Non-Steroidal Anti-Inflammatory Drug)      GI issues    Promethazine  Hcl      Restless legs    Latex Rash    Sulfa  (Sulfonamide Antibiotics) Rash
